# Patient Record
Sex: Female | Born: 1985 | Race: Black or African American | Hispanic: No | Marital: Married | Smoking: Former smoker
Health system: Southern US, Community
[De-identification: ages and names within clinical notes are randomized; demographics above are authoritative.]

## PROBLEM LIST (undated history)

## (undated) ENCOUNTER — Inpatient Hospital Stay (HOSPITAL_COMMUNITY): Payer: Self-pay

## (undated) DIAGNOSIS — D259 Leiomyoma of uterus, unspecified: Secondary | ICD-10-CM

## (undated) DIAGNOSIS — F319 Bipolar disorder, unspecified: Secondary | ICD-10-CM

## (undated) DIAGNOSIS — K219 Gastro-esophageal reflux disease without esophagitis: Secondary | ICD-10-CM

## (undated) DIAGNOSIS — Z9151 Personal history of suicidal behavior: Secondary | ICD-10-CM

## (undated) DIAGNOSIS — G4733 Obstructive sleep apnea (adult) (pediatric): Secondary | ICD-10-CM

## (undated) DIAGNOSIS — J4520 Mild intermittent asthma, uncomplicated: Secondary | ICD-10-CM

## (undated) DIAGNOSIS — F419 Anxiety disorder, unspecified: Secondary | ICD-10-CM

## (undated) DIAGNOSIS — Z8489 Family history of other specified conditions: Secondary | ICD-10-CM

## (undated) DIAGNOSIS — Z8679 Personal history of other diseases of the circulatory system: Secondary | ICD-10-CM

## (undated) DIAGNOSIS — J189 Pneumonia, unspecified organism: Secondary | ICD-10-CM

## (undated) DIAGNOSIS — R519 Headache, unspecified: Secondary | ICD-10-CM

## (undated) DIAGNOSIS — G5601 Carpal tunnel syndrome, right upper limb: Secondary | ICD-10-CM

## (undated) DIAGNOSIS — Z9989 Dependence on other enabling machines and devices: Secondary | ICD-10-CM

## (undated) DIAGNOSIS — Z8619 Personal history of other infectious and parasitic diseases: Secondary | ICD-10-CM

## (undated) DIAGNOSIS — Z915 Personal history of self-harm: Secondary | ICD-10-CM

## (undated) DIAGNOSIS — R7303 Prediabetes: Secondary | ICD-10-CM

## (undated) DIAGNOSIS — M1990 Unspecified osteoarthritis, unspecified site: Secondary | ICD-10-CM

## (undated) DIAGNOSIS — R51 Headache: Secondary | ICD-10-CM

## (undated) DIAGNOSIS — Z8614 Personal history of Methicillin resistant Staphylococcus aureus infection: Secondary | ICD-10-CM

## (undated) DIAGNOSIS — F329 Major depressive disorder, single episode, unspecified: Secondary | ICD-10-CM

## (undated) DIAGNOSIS — F32A Depression, unspecified: Secondary | ICD-10-CM

## (undated) DIAGNOSIS — I10 Essential (primary) hypertension: Secondary | ICD-10-CM

## (undated) HISTORY — DX: Major depressive disorder, single episode, unspecified: F32.9

## (undated) HISTORY — PX: CHOLECYSTECTOMY: SHX55

## (undated) HISTORY — PX: TRANSTHORACIC ECHOCARDIOGRAM: SHX275

## (undated) HISTORY — PX: ESOPHAGOGASTRODUODENOSCOPY: SHX1529

## (undated) HISTORY — DX: Depression, unspecified: F32.A

## (undated) HISTORY — PX: DILATION AND EVACUATION: SHX1459

## (undated) HISTORY — PX: SKIN GRAFT: SHX250

## (undated) HISTORY — PX: WISDOM TOOTH EXTRACTION: SHX21

## (undated) HISTORY — PX: COLONOSCOPY: SHX174

---
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
## 2007-01-18 ENCOUNTER — Emergency Department (HOSPITAL_COMMUNITY): Admission: EM | Admit: 2007-01-18 | Discharge: 2007-01-18 | Payer: Self-pay | Admitting: Emergency Medicine

## 2007-01-30 ENCOUNTER — Encounter (INDEPENDENT_AMBULATORY_CARE_PROVIDER_SITE_OTHER): Payer: Self-pay | Admitting: Family Medicine

## 2007-01-30 ENCOUNTER — Ambulatory Visit: Payer: Self-pay | Admitting: Sports Medicine

## 2007-01-30 DIAGNOSIS — A6000 Herpesviral infection of urogenital system, unspecified: Secondary | ICD-10-CM | POA: Insufficient documentation

## 2007-01-30 DIAGNOSIS — B351 Tinea unguium: Secondary | ICD-10-CM | POA: Insufficient documentation

## 2007-01-30 DIAGNOSIS — R300 Dysuria: Secondary | ICD-10-CM | POA: Insufficient documentation

## 2007-01-30 LAB — CONVERTED CEMR LAB
Bilirubin Urine: NEGATIVE
Glucose, Urine, Semiquant: NEGATIVE
Ketones, urine, test strip: NEGATIVE
Nitrite: NEGATIVE
Protein, U semiquant: 100
Specific Gravity, Urine: >=1.03
Urobilinogen, UA: 0.2
pH: 6

## 2007-02-04 ENCOUNTER — Telehealth (INDEPENDENT_AMBULATORY_CARE_PROVIDER_SITE_OTHER): Payer: Self-pay

## 2007-02-05 ENCOUNTER — Ambulatory Visit: Payer: Self-pay | Admitting: Family Medicine

## 2007-02-05 ENCOUNTER — Encounter (INDEPENDENT_AMBULATORY_CARE_PROVIDER_SITE_OTHER): Payer: Self-pay | Admitting: Family Medicine

## 2007-02-05 DIAGNOSIS — N898 Other specified noninflammatory disorders of vagina: Secondary | ICD-10-CM | POA: Insufficient documentation

## 2007-02-05 LAB — CONVERTED CEMR LAB
Beta hcg, urine, semiquantitative: NEGATIVE
Whiff Test: NEGATIVE

## 2007-02-09 LAB — CONVERTED CEMR LAB
Chlamydia, DNA Probe: NEGATIVE
GC Probe Amp, Genital: NEGATIVE

## 2007-02-11 ENCOUNTER — Encounter: Payer: Self-pay | Admitting: Family Medicine

## 2007-02-19 ENCOUNTER — Encounter: Payer: Self-pay | Admitting: *Deleted

## 2007-02-24 ENCOUNTER — Telehealth (INDEPENDENT_AMBULATORY_CARE_PROVIDER_SITE_OTHER): Payer: Self-pay | Admitting: Family Medicine

## 2007-02-24 ENCOUNTER — Telehealth: Payer: Self-pay | Admitting: *Deleted

## 2007-02-25 ENCOUNTER — Telehealth: Payer: Self-pay | Admitting: *Deleted

## 2007-02-26 ENCOUNTER — Ambulatory Visit: Payer: Self-pay | Admitting: Sports Medicine

## 2007-02-26 DIAGNOSIS — R0602 Shortness of breath: Secondary | ICD-10-CM | POA: Insufficient documentation

## 2007-02-26 DIAGNOSIS — M549 Dorsalgia, unspecified: Secondary | ICD-10-CM | POA: Insufficient documentation

## 2007-02-26 LAB — CONVERTED CEMR LAB
Bilirubin Urine: NEGATIVE
Blood in Urine, dipstick: NEGATIVE
Glucose, Urine, Semiquant: NEGATIVE
Ketones, urine, test strip: NEGATIVE
Nitrite: NEGATIVE
Protein, U semiquant: 100
Specific Gravity, Urine: >=1.03
Urobilinogen, UA: 1
WBC Urine, dipstick: NEGATIVE
pH: 6.5

## 2007-02-27 ENCOUNTER — Encounter (INDEPENDENT_AMBULATORY_CARE_PROVIDER_SITE_OTHER): Payer: Self-pay | Admitting: Family Medicine

## 2007-03-01 ENCOUNTER — Telehealth (INDEPENDENT_AMBULATORY_CARE_PROVIDER_SITE_OTHER): Payer: Self-pay | Admitting: Family Medicine

## 2007-03-04 ENCOUNTER — Telehealth: Payer: Self-pay | Admitting: *Deleted

## 2007-03-04 ENCOUNTER — Telehealth (INDEPENDENT_AMBULATORY_CARE_PROVIDER_SITE_OTHER): Payer: Self-pay | Admitting: *Deleted

## 2007-03-06 ENCOUNTER — Telehealth (INDEPENDENT_AMBULATORY_CARE_PROVIDER_SITE_OTHER): Payer: Self-pay | Admitting: Family Medicine

## 2007-03-20 ENCOUNTER — Ambulatory Visit: Payer: Self-pay | Admitting: Family Medicine

## 2007-03-23 ENCOUNTER — Emergency Department (HOSPITAL_COMMUNITY): Admission: EM | Admit: 2007-03-23 | Discharge: 2007-03-23 | Payer: Self-pay | Admitting: Family Medicine

## 2007-04-15 ENCOUNTER — Emergency Department (HOSPITAL_COMMUNITY): Admission: EM | Admit: 2007-04-15 | Discharge: 2007-04-15 | Payer: Self-pay | Admitting: Emergency Medicine

## 2007-05-20 ENCOUNTER — Emergency Department (HOSPITAL_COMMUNITY): Admission: EM | Admit: 2007-05-20 | Discharge: 2007-05-21 | Payer: Self-pay | Admitting: Emergency Medicine

## 2007-06-14 ENCOUNTER — Emergency Department (HOSPITAL_COMMUNITY): Admission: EM | Admit: 2007-06-14 | Discharge: 2007-06-14 | Payer: Self-pay | Admitting: Emergency Medicine

## 2007-08-17 ENCOUNTER — Emergency Department (HOSPITAL_COMMUNITY): Admission: EM | Admit: 2007-08-17 | Discharge: 2007-08-17 | Payer: Self-pay | Admitting: Emergency Medicine

## 2007-12-10 ENCOUNTER — Emergency Department (HOSPITAL_COMMUNITY): Admission: EM | Admit: 2007-12-10 | Discharge: 2007-12-10 | Payer: Self-pay | Admitting: Emergency Medicine

## 2008-03-19 ENCOUNTER — Emergency Department (HOSPITAL_COMMUNITY): Admission: EM | Admit: 2008-03-19 | Discharge: 2008-03-19 | Payer: Self-pay | Admitting: Emergency Medicine

## 2008-03-31 ENCOUNTER — Emergency Department (HOSPITAL_COMMUNITY): Admission: EM | Admit: 2008-03-31 | Discharge: 2008-03-31 | Payer: Self-pay | Admitting: Emergency Medicine

## 2008-04-03 ENCOUNTER — Emergency Department (HOSPITAL_COMMUNITY): Admission: EM | Admit: 2008-04-03 | Discharge: 2008-04-03 | Payer: Self-pay | Admitting: Emergency Medicine

## 2008-04-07 ENCOUNTER — Inpatient Hospital Stay (HOSPITAL_COMMUNITY): Admission: AD | Admit: 2008-04-07 | Discharge: 2008-04-07 | Payer: Self-pay | Admitting: Obstetrics and Gynecology

## 2008-07-11 ENCOUNTER — Inpatient Hospital Stay (HOSPITAL_COMMUNITY): Admission: AD | Admit: 2008-07-11 | Discharge: 2008-07-11 | Payer: Self-pay | Admitting: Obstetrics and Gynecology

## 2008-07-25 ENCOUNTER — Inpatient Hospital Stay (HOSPITAL_COMMUNITY): Admission: AD | Admit: 2008-07-25 | Discharge: 2008-07-25 | Payer: Self-pay | Admitting: Obstetrics and Gynecology

## 2008-11-17 ENCOUNTER — Inpatient Hospital Stay (HOSPITAL_COMMUNITY): Admission: AD | Admit: 2008-11-17 | Discharge: 2008-11-17 | Payer: Self-pay | Admitting: Obstetrics and Gynecology

## 2008-11-21 ENCOUNTER — Inpatient Hospital Stay (HOSPITAL_COMMUNITY): Admission: AD | Admit: 2008-11-21 | Discharge: 2008-11-21 | Payer: Self-pay | Admitting: Obstetrics and Gynecology

## 2008-12-08 ENCOUNTER — Inpatient Hospital Stay (HOSPITAL_COMMUNITY): Admission: AD | Admit: 2008-12-08 | Discharge: 2008-12-08 | Payer: Self-pay | Admitting: Obstetrics and Gynecology

## 2008-12-10 ENCOUNTER — Inpatient Hospital Stay (HOSPITAL_COMMUNITY): Admission: AD | Admit: 2008-12-10 | Discharge: 2008-12-12 | Payer: Self-pay | Admitting: Obstetrics and Gynecology

## 2008-12-20 ENCOUNTER — Emergency Department (HOSPITAL_COMMUNITY): Admission: EM | Admit: 2008-12-20 | Discharge: 2008-12-20 | Payer: Self-pay | Admitting: Emergency Medicine

## 2009-01-09 ENCOUNTER — Emergency Department (HOSPITAL_COMMUNITY): Admission: EM | Admit: 2009-01-09 | Discharge: 2009-01-10 | Payer: Self-pay | Admitting: Emergency Medicine

## 2009-01-27 DIAGNOSIS — F1210 Cannabis abuse, uncomplicated: Secondary | ICD-10-CM | POA: Insufficient documentation

## 2009-01-27 DIAGNOSIS — F913 Oppositional defiant disorder: Secondary | ICD-10-CM | POA: Insufficient documentation

## 2009-01-27 DIAGNOSIS — F1010 Alcohol abuse, uncomplicated: Secondary | ICD-10-CM | POA: Insufficient documentation

## 2009-01-27 DIAGNOSIS — F909 Attention-deficit hyperactivity disorder, unspecified type: Secondary | ICD-10-CM | POA: Insufficient documentation

## 2009-01-27 DIAGNOSIS — I10 Essential (primary) hypertension: Secondary | ICD-10-CM | POA: Insufficient documentation

## 2009-01-27 DIAGNOSIS — K625 Hemorrhage of anus and rectum: Secondary | ICD-10-CM | POA: Insufficient documentation

## 2009-01-27 DIAGNOSIS — F3130 Bipolar disorder, current episode depressed, mild or moderate severity, unspecified: Secondary | ICD-10-CM | POA: Insufficient documentation

## 2009-02-08 ENCOUNTER — Telehealth: Payer: Self-pay | Admitting: Gastroenterology

## 2009-06-20 ENCOUNTER — Emergency Department (HOSPITAL_COMMUNITY): Admission: EM | Admit: 2009-06-20 | Discharge: 2009-06-20 | Payer: Self-pay | Admitting: Family Medicine

## 2009-10-08 ENCOUNTER — Emergency Department (HOSPITAL_COMMUNITY)
Admission: EM | Admit: 2009-10-08 | Discharge: 2009-10-08 | Payer: Self-pay | Source: Home / Self Care | Admitting: Emergency Medicine

## 2009-11-03 ENCOUNTER — Emergency Department (HOSPITAL_COMMUNITY): Admission: EM | Admit: 2009-11-03 | Discharge: 2009-11-03 | Payer: Self-pay | Admitting: Emergency Medicine

## 2010-03-31 ENCOUNTER — Emergency Department (HOSPITAL_COMMUNITY): Admission: EM | Admit: 2010-03-31 | Discharge: 2010-03-31 | Payer: Self-pay | Admitting: Family Medicine

## 2010-04-02 ENCOUNTER — Emergency Department (HOSPITAL_COMMUNITY): Admission: EM | Admit: 2010-04-02 | Discharge: 2010-04-02 | Payer: Self-pay | Admitting: Emergency Medicine

## 2010-05-19 ENCOUNTER — Emergency Department (HOSPITAL_COMMUNITY): Admission: EM | Admit: 2010-05-19 | Discharge: 2010-05-19 | Payer: Self-pay | Admitting: Emergency Medicine

## 2010-05-19 ENCOUNTER — Emergency Department (HOSPITAL_COMMUNITY): Admission: EM | Admit: 2010-05-19 | Discharge: 2010-05-20 | Payer: Self-pay | Admitting: Emergency Medicine

## 2010-06-08 DEATH — deceased

## 2010-11-07 NOTE — Progress Notes (Signed)
Summary: Jarold Motto will not see   ---- 01/31/2009 8:44 AM, Mardella Layman MD [facility] wrote: I will not accept her as a patient,,,,please remove her  ---- 01/28/2009 10:46 AM, Harlow Mares CMA wrote: Lorain Childes.Marland KitchenMarland KitchenMarland KitchenPlease see the note from 02-27-2007 and 03-20-2007, patient is on your schedule for 02-01-2009. Just thought you would need to review her notes before her office visit. Please let me know if you would like me to contact the patient.  Hulan Saas ------------------------------

## 2010-11-07 NOTE — Progress Notes (Signed)
  Phone Note Call from Patient   Summary of Call: States saw Dr. Seleta Rhymes recently and he was supposed to call in script for Valtrex but its not at her pharmacy.  Requesting me to call it in.  Confirmed in centricity that Dr. Seleta Rhymes did try to prescribe medication but per drfrist, fax failed.  Called in script directly to [facility] drug exactly as precscribed by dr. Seleta Rhymes. Initial call taken by: Rolm Gala MD,  Mar 01, 2007 5:09 PM

## 2010-11-07 NOTE — Progress Notes (Signed)
  Phone Note Call from Patient   Summary of Call: Herpes outbreaks itches and she is mad.  They hurt.  I let my mommy look at them.  Has not tried Tylenol or Motrin.  Stop scratchin them, buy some Motrin 400mg  every 4 hours or Tylenol 325 - 2 pills every 4-6 hours.  Cold ice pack for itching. Initial call taken by: Rolm Gala MD,  Mar 06, 2007 5:36 PM

## 2010-11-07 NOTE — Assessment & Plan Note (Signed)
Summary: blister groin area, congestion/ls   Vital Signs:  Patient Profile:   25 Years Old Female Height:     64.5 inches Weight:      164 pounds Temp:     100 degrees F Pulse rate:   57 / minute BP sitting:   122 / 83  Pt. in pain?   yes    Location:   groin    Intensity:   8  Vitals Entered By: Dennison Nancy RN (February 05, 2007 3:34 PM)                Chief Complaint:  Two blisters to right groin area.  History of Present Illness: 25 yo   Blister- right groin area.  Draining pus.  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  Taking vicodin and nyquil secondary to pain.   Seen at urgent care few days prior to evaluation here for dysuria and blisters in vaginal area given Valtrex states no cultures were obtained .  LMP 01/07/07 normal duration.  Last sexual encounter 1 week ago protected per pt.  Treated for chlamydia in past 2006, states partner was treated.  Female partner has hx herpes.          Physical Exam  General:     Anxious, inappropriate behavior and responses.  Wanting to smoke in exam room Abdomen:     Bowel sounds positive,abdomen soft and non-tender without masses, organomegaly or hernias noted. Genitalia:     Normal ext female genitalia.  No discharge < 1 cm nodule right inguinal area no drainage.  Small ulceration right vaginal wall.  No cervical motion tenderness.     Impression & Recommendations:  Problem # 1:  VAGINAL DISCHARGE (ICD-623.5) Assessment: New High risk sexual behavior and recently tx herpes genitalia.  Cultures pending for today.  Inguinal nodule concern  possible GC/ CHL, bartholins, v/s lymphogranuloma venereum.   HIV/RPR pending.   Follow up cultures.  Apply warm compress allow spontaneous drainage.  Recommend contraception use.  Needs extensive education and follow up with primary physician.  Qualifies for gardisil.   Orders: [facility]- Est  Level 4 (99214) GC/Chlamydia-[facility] (87591/87491) HSV 2- [facility]  (16109-60454) HIV-[facility] [phone]) Wet Prep- [facility] (29562) RPR-[facility] [phone]) U Preg-[facility] (96295) [facility]    Patient Instructions: 1)  Please schedule a follow-up appointment in 2 weeks with Dr. Philipp Deputy primary physician. 2)  1) Lab work drawn today, if any abnormal results we will contact you. 3)  2) Apply warm compress to the area and allow to drain.   4)  Return sooner if fever or worsening symptoms.    Laboratory Results   Urine Tests  Date/Time Recieved: February 05, 2007 4:29 PM  Date/Time Reported: February 05, 2007 4:39 PM     Urine HCG: negative Comments: ...................................................................DONNA [facility]  February 05, 2007 4:39 PM  Date/Time Received: February 05, 2007 4:29 PM  Date/Time Reported: February 05, 2007 4:40 PM   Wet Mount/KOH Source: Vaginal WBC/hpf 0-3 Bacteria/hpf 3+  Rods Clue cells/hpf none  Negative whiff Yeast/hpf none Trichomonas/hpf none Comments ...................................................................DONNA [facility]  February 05, 2007 4:40 PM

## 2010-11-07 NOTE — Progress Notes (Signed)
Summary: Triage  Phone Note Call from Patient Call back at [phone]   Summary of Call: pt is wanting to know what something stand for - HSB? ( I couldn't understand what she was saying) Initial call taken by: Haydee Salter,  Mar 04, 2007 2:12 PM  Follow-up for Phone Call        Spoke with pt - she states she got test results from our office, and wants to know what HSV II is because her test for this was positive.  Advised pt that this is genital herpes.  She states she takes valtrex, advised that this is the medication commonly used to help control genital herpes.  Follow-up by: AMY MARTIN RN,  Mar 04, 2007 2:22 PM

## 2010-11-07 NOTE — Assessment & Plan Note (Signed)
Summary: [name]
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
## 2010-11-07 NOTE — Progress Notes (Signed)
Summary: wi request  Phone Note Call from Patient Call back at [phone]   Reason for Call: Talk to Nurse Summary of Call: pt is requesting wi appt, she sts she could not make her appt today but still needs to be seen asap Initial call taken by: ERIN LEVAN,  Feb 25, 2007 3:01 PM  Follow-up for Phone Call        reports lower pelvic pain and lt low back pain. also has herpes breakout . areas  bleeding. states she has med to take for herpes . this just started today. was not able to come to apoointment today but requests appointment asap. appointment scheduled tomorrow afternoon. Follow-up by: Theresia Lo RN,  Feb 25, 2007 3:36 PM

## 2010-11-07 NOTE — Progress Notes (Signed)
Summary: WI request  Phone Note Call from Patient Call back at Home Phone [phone]   Reason for Call: Talk to Nurse Summary of Call: pt staes she was in 3 days ago and is doing no better - would like to be seen today Initial call taken by: Haydee Salter,  February 04, 2007 9:07 AM    pt reports blister quarter size groin area when developed 2 days ago.also having problems with congestion and watery eyes. appointment scheduled tomorrow. Theresia Lo RN 10:55
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
## 2010-11-07 NOTE — Progress Notes (Signed)
Summary: WI request  Phone Note Call from Patient Call back at [phone]   Reason for Call: Talk to Nurse Summary of Call: pt had 9 am appt but missed it and states she needs to see a dr today for her fingers Initial call taken by: Haydee Salter,  December 16, 2006 10:05 AM  Follow-up for Phone Call        ATTEMPTED TO CONTACT PT ,NO ANSWER Follow-up by: Theresia Lo RN,  December 16, 2006 11:08 AM  Additional Follow-up for Phone Call Additional follow up Details #1::        ATTEMPTED TO CONTACT PT AGAIN NO ANSWER Additional Follow-up by: Theresia Lo RN,  December 16, 2006 12:14 PM

## 2010-11-07 NOTE — Miscellaneous (Signed)
Summary: fired from practice  Clinical Lists Changes  Observations: Added new observation of HPI: Due to the forging of prescriptions in my name (Tramadol and Ultram - see phone note from [facility]) and also in light of continued disruptive behavior along with multiple DKNA and lack of compliance, Savannah Batty will be fired from this practice.  She did not show for her appointment with me so that I could tell her in person and thus will need the registered letter.  This has been discussed both with Dennison Nancy and Dr Deirdre Priest and they are also in agreement. She has medicaid and will be able to find healthcare elsewhere easily.  (02/27/2007 16:41)      History of Present Illness: Due to the forging of prescriptions in my name (Tramadol and Ultram - see phone note from [facility]) and also in light of continued disruptive behavior along with multiple DKNA and lack of compliance, Sevanna Salado will be fired from this practice.  She did not show for her appointment with me so that I could tell her in person and thus will need the registered letter.  This has been discussed both with Dennison Nancy and Dr Deirdre Priest and they are also in agreement. She has medicaid and will be able to find healthcare elsewhere easily.
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
## 2010-11-07 NOTE — Progress Notes (Signed)
Summary: HIV results  Phone Note Call from Patient Call back at [phone]   Summary of Call: pt is requesting results from HIV test Initial call taken by: Haydee Salter,  Mar 04, 2007 9:41 AM  Follow-up for Phone Call        TC to pt regarding above, mess. left asking pt to return call. Pt needs a f/u appt for lab results Follow-up by: Tomasa Rand,  Mar 04, 2007 11:42 AM  Additional Follow-up for Phone Call Additional follow up Details #1::        Pt has been fired from our practice.  Please call patient with results but be aware she is supposed to be receiving a letter to fire her. No follow up appointments should be scheduled.  Additional Follow-up by: Lupita Raider MD,  Mar 04, 2007 7:54 PM   Additional Follow-up for Phone Call Additional follow up Details #2::    see triage note from 5.27.08 Follow-up by: [facility] CMA,  Mar 05, 2007 9:41 AM

## 2010-11-07 NOTE — Progress Notes (Signed)
  Phone Note Call from Patient   Reason for Call: Talk to Doctor Summary of Call: mother called.  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  mom wants to know what HSV2 means.  I told her it means genital herpes. Initial call taken by: Rolm Gala MD,  Mar 06, 2007 5:56 PM

## 2010-11-07 NOTE — Progress Notes (Signed)
  Phone Note Call from Patient Call back at [phone]   Summary of Call: Has genital herpes got pills for 5 days, hurting still, not gone.  Bad cramps in stomuch, side, back.  No urianry fruqency, some burning with pee.  Plan: Needs [place] appt to r/o pyelonephritis.   Initial call taken by: Rolm Gala MD,  Feb 24, 2007 12:58 PM  Follow-up for Phone Call        pt unable to come in today. refused any time slots. made appt for tues pm at her request Follow-up by: Golden Circle RN,  Feb 24, 2007 1:44 PM
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
## 2010-11-07 NOTE — Progress Notes (Signed)
Summary: triage  Phone Note Call from Patient Call back at [phone]   Reason for Call: Talk to Nurse Summary of Call: pt sts she has an appt tomorrow but doesn't think she can wait that long, she sts her stomach hurts really bad & shes out of breath, she wants to know if she should go to the er? Initial call taken by: ERIN LEVAN,  Feb 24, 2007 4:24 PM  Follow-up for Phone Call        she is having a relative pick her up from work. states she is much worse. Will go to Urgent Care Follow-up by: Golden Circle RN,  Feb 24, 2007 4:32 PM

## 2010-12-22 LAB — POCT I-STAT, CHEM 8
BUN: 16 mg/dL (ref 6–23)
Calcium, Ion: 1.18 mmol/L (ref 1.12–1.32)
Chloride: 108 mEq/L (ref 96–112)
Creatinine, Ser: 0.9 mg/dL (ref 0.4–1.2)
Glucose, Bld: 98 mg/dL (ref 70–99)
HCT: 41 % (ref 36.0–46.0)
Hemoglobin: 13.9 g/dL (ref 12.0–15.0)
Potassium: 3.9 mEq/L (ref 3.5–5.1)
Sodium: 141 mEq/L (ref 135–145)
TCO2: 24 mmol/L (ref 0–100)

## 2010-12-22 LAB — DIFFERENTIAL
Basophils Absolute: 0 10*3/uL (ref 0.0–0.1)
Basophils Relative: 0 % (ref 0–1)
Eosinophils Absolute: 0.4 10*3/uL (ref 0.0–0.7)
Eosinophils Relative: 4 % (ref 0–5)
Lymphocytes Relative: 17 % (ref 12–46)
Lymphs Abs: 1.7 10*3/uL (ref 0.7–4.0)
Monocytes Absolute: 0.2 10*3/uL (ref 0.1–1.0)
Monocytes Relative: 2 % — ABNORMAL LOW (ref 3–12)
Neutro Abs: 8.1 10*3/uL — ABNORMAL HIGH (ref 1.7–7.7)
Neutrophils Relative %: 78 % — ABNORMAL HIGH (ref 43–77)

## 2010-12-22 LAB — CBC
HCT: 38.3 % (ref 36.0–46.0)
Hemoglobin: 12.8 g/dL (ref 12.0–15.0)
MCH: 29.4 pg (ref 26.0–34.0)
MCHC: 33.4 g/dL (ref 30.0–36.0)
MCV: 87.8 fL (ref 78.0–100.0)
Platelets: 273 10*3/uL (ref 150–400)
RBC: 4.36 MIL/uL (ref 3.87–5.11)
RDW: 15.5 % (ref 11.5–15.5)
WBC: 10.3 10*3/uL (ref 4.0–10.5)

## 2010-12-24 LAB — POCT URINALYSIS DIP (DEVICE)
Bilirubin Urine: NEGATIVE
Glucose, UA: NEGATIVE mg/dL
Hgb urine dipstick: NEGATIVE
Ketones, ur: NEGATIVE mg/dL
Nitrite: NEGATIVE
Protein, ur: NEGATIVE mg/dL
Specific Gravity, Urine: 1.025 (ref 1.005–1.030)
Urobilinogen, UA: 0.2 mg/dL (ref 0.0–1.0)
pH: 6 (ref 5.0–8.0)

## 2010-12-24 LAB — GC/CHLAMYDIA PROBE AMP, GENITAL
Chlamydia, DNA Probe: NEGATIVE
GC Probe Amp, Genital: NEGATIVE

## 2010-12-24 LAB — WET PREP, GENITAL
Trich, Wet Prep: NONE SEEN
Yeast Wet Prep HPF POC: NONE SEEN

## 2010-12-24 LAB — POCT PREGNANCY, URINE: Preg Test, Ur: NEGATIVE

## 2010-12-28 ENCOUNTER — Inpatient Hospital Stay (INDEPENDENT_AMBULATORY_CARE_PROVIDER_SITE_OTHER)
Admission: RE | Admit: 2010-12-28 | Discharge: 2010-12-28 | Disposition: A | Discharge status: Home / Self Care | Payer: Medicare Other | Source: Ambulatory Visit | Attending: Emergency Medicine | Admitting: Emergency Medicine

## 2010-12-28 DIAGNOSIS — J019 Acute sinusitis, unspecified: Secondary | ICD-10-CM

## 2010-12-28 DIAGNOSIS — R05 Cough: Secondary | ICD-10-CM

## 2010-12-28 DIAGNOSIS — R059 Cough, unspecified: Secondary | ICD-10-CM

## 2011-01-12 LAB — URINE CULTURE: Colony Count: >=100000

## 2011-01-12 LAB — POCT URINALYSIS DIP (DEVICE)
Bilirubin Urine: NEGATIVE
Glucose, UA: NEGATIVE mg/dL
Hgb urine dipstick: NEGATIVE
Ketones, ur: NEGATIVE mg/dL
Nitrite: NEGATIVE
Protein, ur: 30 mg/dL — AB
Specific Gravity, Urine: 1.025 (ref 1.005–1.030)
Urobilinogen, UA: 0.2 mg/dL (ref 0.0–1.0)
pH: 6 (ref 5.0–8.0)

## 2011-01-12 LAB — WET PREP, GENITAL
Trich, Wet Prep: NONE SEEN
Yeast Wet Prep HPF POC: NONE SEEN

## 2011-01-12 LAB — GC/CHLAMYDIA PROBE AMP, GENITAL
Chlamydia, DNA Probe: NEGATIVE
GC Probe Amp, Genital: NEGATIVE

## 2011-01-12 LAB — POCT PREGNANCY, URINE: Preg Test, Ur: NEGATIVE

## 2011-01-17 LAB — CBC
HCT: 33.7 % — ABNORMAL LOW (ref 36.0–46.0)
Hemoglobin: 11.3 g/dL — ABNORMAL LOW (ref 12.0–15.0)
MCHC: 33.4 g/dL (ref 30.0–36.0)
MCV: 86.7 fL (ref 78.0–100.0)
Platelets: 215 10*3/uL (ref 150–400)
RBC: 3.89 MIL/uL (ref 3.87–5.11)
RDW: 16.3 % — ABNORMAL HIGH (ref 11.5–15.5)
WBC: 6.4 10*3/uL (ref 4.0–10.5)

## 2011-01-17 LAB — DIFFERENTIAL
Basophils Absolute: 0 10*3/uL (ref 0.0–0.1)
Basophils Relative: 1 % (ref 0–1)
Eosinophils Absolute: 0.7 10*3/uL (ref 0.0–0.7)
Eosinophils Relative: 11 % — ABNORMAL HIGH (ref 0–5)
Lymphocytes Relative: 50 % — ABNORMAL HIGH (ref 12–46)
Lymphs Abs: 3.2 10*3/uL (ref 0.7–4.0)
Monocytes Absolute: 0.3 10*3/uL (ref 0.1–1.0)
Monocytes Relative: 5 % (ref 3–12)
Neutro Abs: 2.2 10*3/uL (ref 1.7–7.7)
Neutrophils Relative %: 34 % — ABNORMAL LOW (ref 43–77)

## 2011-01-17 LAB — BASIC METABOLIC PANEL
BUN: 9 mg/dL (ref 6–23)
CO2: 25 mEq/L (ref 19–32)
Calcium: 8.8 mg/dL (ref 8.4–10.5)
Chloride: 111 mEq/L (ref 96–112)
Creatinine, Ser: 0.82 mg/dL (ref 0.4–1.2)
GFR calc Af Amer: >60 mL/min (ref >60)
GFR calc non Af Amer: >60 mL/min (ref >60)
Glucose, Bld: 112 mg/dL — ABNORMAL HIGH (ref 70–99)
Potassium: 3.2 mEq/L — ABNORMAL LOW (ref 3.5–5.1)
Sodium: 141 mEq/L (ref 135–145)

## 2011-01-17 LAB — HEMOCCULT GUIAC POC 1CARD (OFFICE): Fecal Occult Bld: POSITIVE

## 2011-01-18 LAB — HEPATIC FUNCTION PANEL
ALT: 22 U/L (ref 0–35)
AST: 20 U/L (ref 0–37)
Albumin: 3.1 g/dL — ABNORMAL LOW (ref 3.5–5.2)
Alkaline Phosphatase: 123 U/L — ABNORMAL HIGH (ref 39–117)
Bilirubin, Direct: <0.1 mg/dL (ref 0.0–0.3)
Total Bilirubin: 0.6 mg/dL (ref 0.3–1.2)
Total Protein: 6.1 g/dL (ref 6.0–8.3)

## 2011-01-18 LAB — URINALYSIS, ROUTINE W REFLEX MICROSCOPIC
Bilirubin Urine: NEGATIVE
Glucose, UA: NEGATIVE mg/dL
Ketones, ur: NEGATIVE mg/dL
Nitrite: NEGATIVE
Protein, ur: NEGATIVE mg/dL
Specific Gravity, Urine: 1.015 (ref 1.005–1.030)
Urobilinogen, UA: 1 mg/dL (ref 0.0–1.0)
pH: 7 (ref 5.0–8.0)

## 2011-01-18 LAB — CBC
HCT: 33.5 % — ABNORMAL LOW (ref 36.0–46.0)
HCT: 33.3 % — ABNORMAL LOW (ref 36.0–46.0)
HCT: 33.7 % — ABNORMAL LOW (ref 36.0–46.0)
Hemoglobin: 11.2 g/dL — ABNORMAL LOW (ref 12.0–15.0)
Hemoglobin: 11 g/dL — ABNORMAL LOW (ref 12.0–15.0)
Hemoglobin: 11.2 g/dL — ABNORMAL LOW (ref 12.0–15.0)
MCHC: 33.3 g/dL (ref 30.0–36.0)
MCHC: 33.2 g/dL (ref 30.0–36.0)
MCHC: 33.3 g/dL (ref 30.0–36.0)
MCV: 87.6 fL (ref 78.0–100.0)
MCV: 88 fL (ref 78.0–100.0)
MCV: 88.4 fL (ref 78.0–100.0)
Platelets: 268 10*3/uL (ref 150–400)
Platelets: 184 10*3/uL (ref 150–400)
Platelets: 190 10*3/uL (ref 150–400)
RBC: 3.83 MIL/uL — ABNORMAL LOW (ref 3.87–5.11)
RBC: 3.76 MIL/uL — ABNORMAL LOW (ref 3.87–5.11)
RBC: 3.83 MIL/uL — ABNORMAL LOW (ref 3.87–5.11)
RDW: 15.2 % (ref 11.5–15.5)
RDW: 14.9 % (ref 11.5–15.5)
RDW: 15.3 % (ref 11.5–15.5)
WBC: 7.2 10*3/uL (ref 4.0–10.5)
WBC: 12.8 10*3/uL — ABNORMAL HIGH (ref 4.0–10.5)
WBC: 7.9 10*3/uL (ref 4.0–10.5)

## 2011-01-18 LAB — DIFFERENTIAL
Basophils Absolute: 0 10*3/uL (ref 0.0–0.1)
Basophils Relative: 0 % (ref 0–1)
Eosinophils Absolute: 0.3 10*3/uL (ref 0.0–0.7)
Eosinophils Relative: 4 % (ref 0–5)
Lymphocytes Relative: 26 % (ref 12–46)
Lymphs Abs: 1.9 10*3/uL (ref 0.7–4.0)
Monocytes Absolute: 0.4 10*3/uL (ref 0.1–1.0)
Monocytes Relative: 5 % (ref 3–12)
Neutro Abs: 4.6 10*3/uL (ref 1.7–7.7)
Neutrophils Relative %: 65 % (ref 43–77)

## 2011-01-18 LAB — URINE MICROSCOPIC-ADD ON

## 2011-01-18 LAB — POCT I-STAT, CHEM 8
BUN: 12 mg/dL (ref 6–23)
Calcium, Ion: 1.19 mmol/L (ref 1.12–1.32)
Chloride: 108 mEq/L (ref 96–112)
Creatinine, Ser: 0.9 mg/dL (ref 0.4–1.2)
Glucose, Bld: 96 mg/dL (ref 70–99)
HCT: 35 % — ABNORMAL LOW (ref 36.0–46.0)
Hemoglobin: 11.9 g/dL — ABNORMAL LOW (ref 12.0–15.0)
Potassium: 4 mEq/L (ref 3.5–5.1)
Sodium: 142 mEq/L (ref 135–145)
TCO2: 26 mmol/L (ref 0–100)

## 2011-01-18 LAB — RPR: RPR Ser Ql: NONREACTIVE
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
## 2011-02-23 NOTE — Discharge Summary (Signed)
[facility]  Patient:    [name], [name]                     MRN: [id number] Adm. Date:  [id number] Disc. Date: [id number] Attending:  Ephriam Knuckles H                           Discharge Summary  REASON FOR ADMISSION:  This 25 year old black female was admitted status post drug overdose as a suicide attempt.  For further History of Present Illness, please see the patients Psychiatric Admission Assessment by Dr. Carolanne Grumbling.  PHYSICAL EXAMINATION:  Physical Examination at the time of admission was remarkable for an old healed scar of the left forearm.  Right arm had a self-inflicted well-healed laceration.  LABORATORY DATA:  The patient underwent a laboratory workup to rule out any medical problems contributing to her symptomatology.  Urine pregnancy test was negative.  CBC was significant for an MCHC of 34.2, and was otherwise unremarkable.  Thyroid function tests were within normal limits.  A GTT was within normal limits.  A GGT was within normal limits.  UA was unremarkable. Hepatic panel was within normal limits.  HOSPITAL COURSE:  The patient received no x-rays, no special procedures, no additional consultations.  The patient rapidly adapted to the unit routine socializing well with both patients and staff.  She was significantly oppositional and defiant.  Affect and mood were depressed.  She was continued on Zoloft and Ritalin.  These were at subtherapeutic doses, and they were titrated up to a therapeutic range.  At the time of discharge, the patient denies any homicidal or suicidal ideation.  She slipped and fell on the tile floor in her room sustaining a contusion to her right hip.  X-rays were negative following the fall.  The patient will follow up with her pediatrician and her orthopedic surgeon to continue to evaluate the hip on an outpatient basis.  At the present time she denies any homicidal or suicidal ideation, she is  participating in all aspects of the therapeutic treatment program, she is less oppositional and defiant, concentration and attention span as well as hyperactive and impulse control have all improved, she no longer appears to be a danger to herself or others, and is presently participating in all aspects of the therapeutic treatment program, and it is felt that the patient is ready for transition to a less restrictive alternative setting and is discharged to home.  CONDITION ON DISCHARGE:  Improved.  CURRENT DIAGNOSES ACCORDING TO DSM4: Axis I:    1. Major depression, recurrent type, severe, without psychosis.            2. Attention deficit hyperactivity disorder, combined type.            3. Oppositional defiant disorder. Axis II:   None. Axis III:  Right hip contusion. Axis IV:   Current psychosocial stressors are severe. Axis V:    Code 20 on admission, code 30 on discharge.  FOLLOWUP:  The patient will follow up with [facility] for all further aspects of her mental health care and consequently I will sign off on the case at this time.  She will follow up with her orthopedic surgeon and pediatrician for continued evaluation of her right hip contusion.  DISCHARGE MEDICATIONS:  The patient is discharged on: 1. Zoloft 150 mg p.o. q.d. 2. Ritalin 20 mg in the morning and at noon.  FURTHER EVALUATION AND TREATMENT RECOMMENDATIONS: She is discharged on an unrestricted level of activity and a regular diet. DD:  08/08/00 TD:  08/08/00 Job: [id number]
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
## 2011-02-28 ENCOUNTER — Emergency Department (HOSPITAL_COMMUNITY): Payer: Medicare Other

## 2011-02-28 ENCOUNTER — Emergency Department (HOSPITAL_COMMUNITY)
Admission: EM | Admit: 2011-02-28 | Discharge: 2011-03-01 | Disposition: A | Discharge status: Home / Self Care | Payer: Medicare Other | Attending: Emergency Medicine | Admitting: Emergency Medicine

## 2011-02-28 DIAGNOSIS — J351 Hypertrophy of tonsils: Secondary | ICD-10-CM | POA: Insufficient documentation

## 2011-02-28 DIAGNOSIS — I10 Essential (primary) hypertension: Secondary | ICD-10-CM | POA: Insufficient documentation

## 2011-02-28 DIAGNOSIS — F319 Bipolar disorder, unspecified: Secondary | ICD-10-CM | POA: Insufficient documentation

## 2011-02-28 DIAGNOSIS — J069 Acute upper respiratory infection, unspecified: Secondary | ICD-10-CM | POA: Insufficient documentation

## 2011-02-28 DIAGNOSIS — R05 Cough: Secondary | ICD-10-CM | POA: Insufficient documentation

## 2011-02-28 DIAGNOSIS — L298 Other pruritus: Secondary | ICD-10-CM | POA: Insufficient documentation

## 2011-02-28 DIAGNOSIS — R070 Pain in throat: Secondary | ICD-10-CM | POA: Insufficient documentation

## 2011-02-28 DIAGNOSIS — B86 Scabies: Secondary | ICD-10-CM | POA: Insufficient documentation

## 2011-02-28 DIAGNOSIS — R059 Cough, unspecified: Secondary | ICD-10-CM | POA: Insufficient documentation

## 2011-02-28 DIAGNOSIS — R21 Rash and other nonspecific skin eruption: Secondary | ICD-10-CM | POA: Insufficient documentation

## 2011-02-28 DIAGNOSIS — H11419 Vascular abnormalities of conjunctiva, unspecified eye: Secondary | ICD-10-CM | POA: Insufficient documentation

## 2011-02-28 DIAGNOSIS — L2989 Other pruritus: Secondary | ICD-10-CM | POA: Insufficient documentation

## 2011-06-20 ENCOUNTER — Inpatient Hospital Stay (HOSPITAL_COMMUNITY)
Admission: RE | Admit: 2011-06-20 | Discharge: 2011-06-20 | Disposition: A | Discharge status: Home / Self Care | Payer: Medicare Other | Source: Ambulatory Visit | Attending: Family Medicine | Admitting: Family Medicine

## 2011-07-02 LAB — URINALYSIS, ROUTINE W REFLEX MICROSCOPIC
Bilirubin Urine: NEGATIVE
Glucose, UA: NEGATIVE
Hgb urine dipstick: NEGATIVE
Ketones, ur: >80 — AB
Nitrite: NEGATIVE
Protein, ur: NEGATIVE
Specific Gravity, Urine: 1.026
Urobilinogen, UA: 1
pH: 7

## 2011-07-02 LAB — POCT PREGNANCY, URINE
Operator id: 234501
Preg Test, Ur: NEGATIVE

## 2011-07-02 LAB — I-STAT 8, (EC8 V) (CONVERTED LAB)
Acid-base deficit: 2
BUN: 11
Bicarbonate: 22.6
Chloride: 104
Glucose, Bld: 114 — ABNORMAL HIGH
HCT: 43
Hemoglobin: 14.6
Operator id: 234501
Potassium: 3.5
Sodium: 136
TCO2: 24
pCO2, Ven: 38.9 — ABNORMAL LOW
pH, Ven: 7.371 — ABNORMAL HIGH

## 2011-07-02 LAB — DIFFERENTIAL
Basophils Absolute: 0
Basophils Relative: 1
Eosinophils Absolute: 0
Eosinophils Relative: 0
Lymphocytes Relative: 16
Lymphs Abs: 0.8
Monocytes Absolute: 0.4
Monocytes Relative: 9
Neutro Abs: 3.5
Neutrophils Relative %: 74

## 2011-07-02 LAB — POCT I-STAT CREATININE
Creatinine, Ser: 1.1
Operator id: 234501

## 2011-07-02 LAB — CBC
HCT: 38.3
Hemoglobin: 13.2
MCHC: 34.3
MCV: 88.4
Platelets: 233
RBC: 4.34
RDW: 13.9
WBC: 4.8

## 2011-07-05 LAB — URINALYSIS, ROUTINE W REFLEX MICROSCOPIC
Bilirubin Urine: NEGATIVE
Glucose, UA: NEGATIVE
Hgb urine dipstick: NEGATIVE
Ketones, ur: NEGATIVE
Nitrite: NEGATIVE
Protein, ur: NEGATIVE
Specific Gravity, Urine: 1.015
Urobilinogen, UA: 0.2
pH: 7

## 2011-07-05 LAB — GC/CHLAMYDIA PROBE AMP, GENITAL
Chlamydia, DNA Probe: NEGATIVE
GC Probe Amp, Genital: NEGATIVE

## 2011-07-05 LAB — WET PREP, GENITAL
Trich, Wet Prep: NONE SEEN
Yeast Wet Prep HPF POC: NONE SEEN

## 2011-07-05 LAB — POCT URINALYSIS DIP (DEVICE)
Bilirubin Urine: NEGATIVE
Glucose, UA: NEGATIVE
Hgb urine dipstick: NEGATIVE
Ketones, ur: NEGATIVE
Nitrite: NEGATIVE
Operator id: 247071
Protein, ur: 30 — AB
Specific Gravity, Urine: 1.025
Urobilinogen, UA: 1
pH: 6

## 2011-07-05 LAB — POCT PREGNANCY, URINE
Operator id: 247071
Preg Test, Ur: POSITIVE

## 2011-07-23 LAB — URINE MICROSCOPIC-ADD ON

## 2011-07-23 LAB — POCT PREGNANCY, URINE
Operator id: 277751
Preg Test, Ur: NEGATIVE

## 2011-07-23 LAB — DIFFERENTIAL
Basophils Absolute: 0.1
Basophils Relative: 1
Eosinophils Absolute: 0.6
Eosinophils Relative: 8 — ABNORMAL HIGH
Lymphocytes Relative: 59 — ABNORMAL HIGH
Lymphs Abs: 4.3 — ABNORMAL HIGH
Monocytes Absolute: 0.2
Monocytes Relative: 3
Neutro Abs: 2.1
Neutrophils Relative %: 29 — ABNORMAL LOW

## 2011-07-23 LAB — COMPREHENSIVE METABOLIC PANEL
ALT: 21
AST: 25
Albumin: 3.7
Alkaline Phosphatase: 61
BUN: 5 — ABNORMAL LOW
CO2: 26
Calcium: 9.4
Chloride: 106
Creatinine, Ser: 0.58
GFR calc Af Amer: >60
GFR calc non Af Amer: >60
Glucose, Bld: 94
Potassium: 3.7
Sodium: 139
Total Bilirubin: 0.4
Total Protein: 6.6

## 2011-07-23 LAB — CBC
HCT: 35.9 — ABNORMAL LOW
Hemoglobin: 12.1
MCHC: 33.6
MCV: 90.7
Platelets: 327
RBC: 3.96
RDW: 13.7
WBC: 7.3

## 2011-07-23 LAB — URINALYSIS, ROUTINE W REFLEX MICROSCOPIC
Bilirubin Urine: NEGATIVE
Glucose, UA: NEGATIVE
Ketones, ur: NEGATIVE
Leukocytes, UA: NEGATIVE
Nitrite: NEGATIVE
Protein, ur: NEGATIVE
Specific Gravity, Urine: 1.013
Urobilinogen, UA: 0.2
pH: 7

## 2011-07-23 LAB — LIPASE, BLOOD: Lipase: 20

## 2011-07-23 LAB — AMYLASE: Amylase: 64

## 2011-07-24 LAB — URINE MICROSCOPIC-ADD ON

## 2011-07-24 LAB — URINALYSIS, ROUTINE W REFLEX MICROSCOPIC
Bilirubin Urine: NEGATIVE
Glucose, UA: NEGATIVE
Hgb urine dipstick: NEGATIVE
Ketones, ur: NEGATIVE
Leukocytes, UA: NEGATIVE
Nitrite: NEGATIVE
Protein, ur: 100 — AB
Specific Gravity, Urine: 1.014
Urobilinogen, UA: 0.2
pH: 7.5

## 2011-07-24 LAB — PREGNANCY, URINE: Preg Test, Ur: NEGATIVE

## 2011-07-25 LAB — POCT URINALYSIS DIP (DEVICE)
Bilirubin Urine: NEGATIVE
Glucose, UA: NEGATIVE
Hgb urine dipstick: NEGATIVE
Ketones, ur: NEGATIVE
Nitrite: NEGATIVE
Operator id: 235561
Protein, ur: 30 — AB
Specific Gravity, Urine: 1.02
Urobilinogen, UA: 1
pH: 7

## 2011-07-25 LAB — POCT PREGNANCY, URINE
Operator id: 235561
Preg Test, Ur: NEGATIVE

## 2011-07-25 LAB — WET PREP, GENITAL
Trich, Wet Prep: NONE SEEN
Yeast Wet Prep HPF POC: NONE SEEN

## 2011-07-25 LAB — GC/CHLAMYDIA PROBE AMP, GENITAL
Chlamydia, DNA Probe: NEGATIVE
GC Probe Amp, Genital: NEGATIVE

## 2011-08-08 ENCOUNTER — Emergency Department (HOSPITAL_COMMUNITY): Payer: Medicare Other

## 2011-08-08 ENCOUNTER — Emergency Department (HOSPITAL_COMMUNITY)
Admission: EM | Admit: 2011-08-08 | Discharge: 2011-08-08 | Disposition: A | Discharge status: Home / Self Care | Payer: Medicare Other | Attending: Emergency Medicine | Admitting: Emergency Medicine

## 2011-08-08 DIAGNOSIS — R059 Cough, unspecified: Secondary | ICD-10-CM | POA: Insufficient documentation

## 2011-08-08 DIAGNOSIS — R0602 Shortness of breath: Secondary | ICD-10-CM | POA: Insufficient documentation

## 2011-08-08 DIAGNOSIS — E669 Obesity, unspecified: Secondary | ICD-10-CM | POA: Insufficient documentation

## 2011-08-08 DIAGNOSIS — R070 Pain in throat: Secondary | ICD-10-CM | POA: Insufficient documentation

## 2011-08-08 DIAGNOSIS — R509 Fever, unspecified: Secondary | ICD-10-CM | POA: Insufficient documentation

## 2011-08-08 DIAGNOSIS — J40 Bronchitis, not specified as acute or chronic: Secondary | ICD-10-CM | POA: Insufficient documentation

## 2011-08-08 DIAGNOSIS — I10 Essential (primary) hypertension: Secondary | ICD-10-CM | POA: Insufficient documentation

## 2011-08-08 DIAGNOSIS — R05 Cough: Secondary | ICD-10-CM | POA: Insufficient documentation

## 2011-08-08 DIAGNOSIS — F319 Bipolar disorder, unspecified: Secondary | ICD-10-CM | POA: Insufficient documentation

## 2011-08-15 ENCOUNTER — Emergency Department (HOSPITAL_COMMUNITY)
Admission: EM | Admit: 2011-08-15 | Discharge: 2011-08-15 | Discharge status: Against Medical Advice | Payer: Medicare Other | Attending: Emergency Medicine | Admitting: Emergency Medicine

## 2011-08-15 ENCOUNTER — Encounter: Payer: Self-pay | Admitting: *Deleted

## 2011-08-15 DIAGNOSIS — R42 Dizziness and giddiness: Secondary | ICD-10-CM

## 2011-08-15 NOTE — ED Notes (Signed)
Rose Fillers

## 2011-08-15 NOTE — ED Notes (Addendum)
Pt in c/o episodes of dizziness x4 nights, states this only happens at night, increased dizziness tonight

## 2011-08-15 NOTE — ED Notes (Signed)
C/o dizziness x 4 days. Worse at night.  Had LT ear pain 1 wk ago that has gone away.  Was also seen at [facility] for cold/cough on Oct 31-told to take motin and robitussin.  Saw pmd today for the dizziness and was given vit D3 which she started today.  Denies any head/nasal congestion and states at times her vision is blurry.  Had echocardiogram around Oct 5th at PMD's office.

## 2011-08-16 NOTE — ED Provider Notes (Signed)
History     CSN: [id number] Arrival date & time: 08/15/2011  8:53 PM   First MD Initiated Contact with Patient 08/15/11 2145      Chief Complaint  Patient presents with  . Dizziness    (Consider location/radiation/quality/duration/timing/severity/associated sxs/prior treatment) HPI  Past Medical History  Diagnosis Date  . Diabetes mellitus     History reviewed. No pertinent past surgical history.  History reviewed. No pertinent family history.  History  Substance Use Topics  . Smoking status: Never Smoker   . Smokeless tobacco: Not on file  . Alcohol Use: No    OB History    Grav Para Term Preterm Abortions TAB SAB Ect Mult Living                  Review of Systems  Allergies  Pineapple; Strawberry; and Divalproex sodium  Home Medications   Current Outpatient Rx  Name Route Sig Dispense Refill  . ALBUTEROL SULFATE HFA 108 (90 BASE) MCG/ACT IN AERS Inhalation Inhale 2 puffs into the lungs every 6 (six) hours as needed. Shortness of breath    . IBUPROFEN 200 MG PO TABS Oral Take 400 mg by mouth every 6 (six) hours as needed. Pain.     Marland Kitchen VITAMIN D (ERGOCALCIFEROL) 50000 UNITS PO CAPS Oral Take 50,000 Units by mouth once a week. Takes 1 tablet every wednesday       BP 141/88  Pulse 76  Temp(Src) 98.7 F (37.1 C) (Oral)  Resp 20  SpO2 98%  Physical Exam  ED Course  Procedures (including critical care time)  Labs Reviewed - No data to display No results found.   1. Dizziness       MDM    I did not see this pt.  Pt LWBS.         Laray Anger, DO 08/16/11 [phone]

## 2011-11-07 ENCOUNTER — Emergency Department (INDEPENDENT_AMBULATORY_CARE_PROVIDER_SITE_OTHER)
Admission: EM | Admit: 2011-11-07 | Discharge: 2011-11-07 | Disposition: A | Discharge status: Home / Self Care | Payer: Medicare Other | Source: Home / Self Care | Attending: Emergency Medicine | Admitting: Emergency Medicine

## 2011-11-07 ENCOUNTER — Encounter (HOSPITAL_COMMUNITY): Payer: Self-pay

## 2011-11-07 DIAGNOSIS — L0390 Cellulitis, unspecified: Secondary | ICD-10-CM

## 2011-11-07 DIAGNOSIS — L0291 Cutaneous abscess, unspecified: Secondary | ICD-10-CM

## 2011-11-07 HISTORY — DX: Bipolar disorder, unspecified: F31.9

## 2011-11-07 MED ORDER — IBUPROFEN 600 MG PO TABS: 600.0000 mg | ORAL_TABLET | Freq: Four times a day (QID) | ORAL | Status: AC | PRN

## 2011-11-07 MED ORDER — MUPIROCIN 2 % EX OINT: TOPICAL_OINTMENT | Freq: Three times a day (TID) | CUTANEOUS | Status: AC

## 2011-11-07 MED ORDER — SULFAMETHOXAZOLE-TRIMETHOPRIM 800-160 MG PO TABS: 1.0000 | ORAL_TABLET | Freq: Two times a day (BID) | ORAL | Status: AC

## 2011-11-07 NOTE — ED Notes (Signed)
C/o 2 day duration of pain, swelling, drainage on her left inner thigh; pain in upper t-spine/shoulders area

## 2011-11-07 NOTE — ED Provider Notes (Signed)
History     CSN: [id number]  Arrival date & time 11/07/11  1050   First MD Initiated Contact with Patient 11/07/11 1141      Chief Complaint  Patient presents with  . Cellulitis    (Consider location/radiation/quality/duration/timing/severity/associated sxs/prior treatment) HPI Comments: Patient with painful, erythematous mass over her left medial upper by starting 2 days ago. Does not recall any trauma to the area. Does not shave in this area. Reports spontaneous drainage starting yesterday. No nausea, vomiting, fevers, erythematous streaking up the extremity, blisters, other, similar lesions currently.Marland Kitchen Has history of skin infections in this location. Patient is diabetic, but states her sugars have been within normal limits recently.  ROS as noted in HPI. All other ROS negative.   Patient is a 26 y.o. female presenting with abscess. The history is provided by the patient.  Abscess  This is a new problem. The abscess is present on the left upper leg. The abscess is characterized by painfulness, redness and draining. It is unknown what she was exposed to.    Past Medical History  Diagnosis Date  . Diabetes mellitus   . Asthma   . Bipolar 1 disorder     History reviewed. No pertinent past surgical history.  History reviewed. No pertinent family history.  History  Substance Use Topics  . Smoking status: Current Everyday Smoker  . Smokeless tobacco: Not on file  . Alcohol Use: No    OB History    Grav Para Term Preterm Abortions TAB SAB Ect Mult Living                  Review of Systems  Allergies  Pineapple; Strawberry; and Divalproex sodium  Home Medications   Current Outpatient Rx  Name Route Sig Dispense Refill  . ALBUTEROL SULFATE HFA 108 (90 BASE) MCG/ACT IN AERS Inhalation Inhale 2 puffs into the lungs every 6 (six) hours as needed. Shortness of breath    . LAMOTRIGINE 200 MG PO TABS Oral Take 200 mg by mouth daily.    . IBUPROFEN 600 MG PO TABS Oral  Take 1 tablet (600 mg total) by mouth every 6 (six) hours as needed for pain. 30 tablet 0  . MUPIROCIN 2 % EX OINT Topical Apply topically 3 (three) times daily. Apply after warm soak for 10 minutes 22 g 0  . SULFAMETHOXAZOLE-TRIMETHOPRIM 800-160 MG PO TABS Oral Take 1 tablet by mouth 2 (two) times daily. 20 tablet 0  . VITAMIN D (ERGOCALCIFEROL) 50000 UNITS PO CAPS Oral Take 50,000 Units by mouth once a week. Takes 1 tablet every wednesday       BP 133/84  Pulse 80  Temp(Src) 98.2 F (36.8 C) (Oral)  Resp 16  SpO2 100%  Physical Exam  Nursing note and vitals reviewed. Constitutional: She is oriented to person, place, and time. She appears well-developed and well-nourished. No distress.  HENT:  Head: Normocephalic and atraumatic.  Eyes: Conjunctivae and EOM are normal.  Neck: Normal range of motion.  Cardiovascular: Regular rhythm.   Pulmonary/Chest: Effort normal.  Abdominal: She exhibits no distension.  Musculoskeletal: Normal range of motion.  Lymphadenopathy:       Right: Inguinal adenopathy present.       Left: Inguinal adenopathy present.  Neurological: She is alert and oriented to person, place, and time.  Skin: Skin is warm and dry.       1X1 centimeter tender area of induration upper left medial thigh, central area with spontaneous serosanguineous drainage. No surrounding  erythema.  Psychiatric: She has a normal mood and affect. Her behavior is normal. Judgment and thought content normal.    ED Course  Procedures (including critical care time)  Labs Reviewed - No data to display No results found.   1. Abscess     MDM  Patient assets that is spontaneously draining. Marked area with permanent marker for reference. Starting patient on Bactrim, ibuprofen, Bactroban. Patient to followup with her doctor or here in 2 days if no improvement. Patient agrees with plan  Luiz Blare, MD 11/07/11 1229

## 2012-03-09 ENCOUNTER — Encounter (HOSPITAL_COMMUNITY): Payer: Self-pay | Admitting: Emergency Medicine

## 2012-03-09 ENCOUNTER — Emergency Department (INDEPENDENT_AMBULATORY_CARE_PROVIDER_SITE_OTHER)
Admission: EM | Admit: 2012-03-09 | Discharge: 2012-03-09 | Disposition: A | Discharge status: Home / Self Care | Payer: Medicare Other | Source: Home / Self Care | Attending: Emergency Medicine | Admitting: Emergency Medicine

## 2012-03-09 ENCOUNTER — Emergency Department (HOSPITAL_COMMUNITY)
Admission: EM | Admit: 2012-03-09 | Discharge: 2012-03-09 | Disposition: A | Discharge status: Home / Self Care | Payer: Medicare HMO | Attending: Emergency Medicine | Admitting: Emergency Medicine

## 2012-03-09 DIAGNOSIS — R0602 Shortness of breath: Secondary | ICD-10-CM | POA: Insufficient documentation

## 2012-03-09 DIAGNOSIS — T7840XA Allergy, unspecified, initial encounter: Secondary | ICD-10-CM

## 2012-03-09 MED ORDER — PREDNISONE 10 MG PO TABS: 50.0000 mg | ORAL_TABLET | Freq: Every day | ORAL | Status: DC

## 2012-03-09 MED ORDER — PREDNISONE 20 MG PO TABS
ORAL_TABLET | ORAL | Status: AC
  Filled 2012-03-09: qty 3

## 2012-03-09 MED ORDER — DIPHENHYDRAMINE HCL 25 MG PO CAPS
25.0000 mg | ORAL_CAPSULE | Freq: Once | ORAL | Status: AC
  Administered 2012-03-09: 25 mg via ORAL

## 2012-03-09 MED ORDER — DIPHENHYDRAMINE HCL 25 MG PO CAPS
ORAL_CAPSULE | ORAL | Status: AC
  Filled 2012-03-09: qty 1

## 2012-03-09 MED ORDER — PREDNISONE 20 MG PO TABS
60.0000 mg | ORAL_TABLET | Freq: Once | ORAL | Status: AC
  Administered 2012-03-09: 60 mg via ORAL

## 2012-03-09 NOTE — ED Notes (Signed)
Patient reports a bee sting last night on right forearm.  Then this morning woke and had hives.  Some areas look like blisters.  Areas on fingers arms, legs, face, trunk..patient scratching all over, some larger bumps are weeping clear liquid

## 2012-03-09 NOTE — ED Provider Notes (Signed)
History     CSN: [id number]  Arrival date & time 03/09/12  1713   First MD Initiated Contact with Patient 03/09/12 1714      Chief Complaint  Patient presents with  . Insect Bite    (Consider location/radiation/quality/duration/timing/severity/associated sxs/prior treatment) HPI Comments: Pt was stung by a bee on R forearm while outside an apartment complex yesterday.  Was not in the woods or near poison ivy.  Since then, area of sting has been itchy, and new areas have appeared on other places on her body that are itchy and "whelps"  Patient is a 26 y.o. female presenting with rash. The history is provided by the patient.  Rash  This is a new problem. The current episode started yesterday. The problem has been gradually worsening. The problem is associated with an insect bite/sting. There has been no fever. The rash is present on the right lower leg, back, left arm and right arm. The pain is at a severity of 9/10. Associated symptoms include blisters and itching. She has tried antihistamines (took one dose of benadryl this morning) for the symptoms. The treatment provided no relief.    Past Medical History  Diagnosis Date  . Diabetes mellitus   . Asthma   . Bipolar 1 disorder     History reviewed. No pertinent past surgical history.  No family history on file.  History  Substance Use Topics  . Smoking status: Current Everyday Smoker  . Smokeless tobacco: Not on file  . Alcohol Use: No    OB History    Grav Para Term Preterm Abortions TAB SAB Ect Mult Living                  Review of Systems  Constitutional: Negative for fever and chills.  HENT: Negative for trouble swallowing.   Respiratory: Negative for shortness of breath and wheezing.   Skin: Positive for itching and rash.    Allergies  Pineapple; Strawberry; and Divalproex sodium  Home Medications   Current Outpatient Rx  Name Route Sig Dispense Refill  . ALBUTEROL SULFATE HFA 108 (90 BASE) MCG/ACT IN  AERS Inhalation Inhale 2 puffs into the lungs every 6 (six) hours as needed. Shortness of breath    . DIPHENHYDRAMINE HCL 25 MG PO CAPS Oral Take 50 mg by mouth once.    Marland Kitchen LAMOTRIGINE 200 MG PO TABS Oral Take 200 mg by mouth daily.    Marland Kitchen PREDNISONE 10 MG PO TABS Oral Take 5 tablets (50 mg total) by mouth daily. 15 tablet 0  . VITAMIN D (ERGOCALCIFEROL) 50000 UNITS PO CAPS Oral Take 50,000 Units by mouth once a week. Takes 1 tablet every Friday      BP 128/85  Pulse 105  Temp(Src) 98.7 F (37.1 C) (Oral)  Resp 20  SpO2 99%  Physical Exam  Constitutional: She appears well-developed and well-nourished. No distress.       Constantly scratching  Cardiovascular: Normal rate and regular rhythm.   Pulmonary/Chest: Effort normal and breath sounds normal. She has no wheezes.  Skin: Skin is warm and dry. Rash noted. Rash is vesicular and urticarial.       Multiple large raised areas on back, arms and legs with central scab, open area, or vesicle.  Raised areas are 1-2.5cm in diameter.  Central areas are 2-57mm.  On right fingers there are 3x41mm blisters.  No evidence infection.     ED Course  Procedures (including critical care time)  Labs Reviewed - No  data to display No results found.   1. Allergic reaction       MDM  Pt claims only to have been stung once on R forearm, but rash areas appear c/w insect bites/stings as if she was bitten multiple times.  Will treat for allergic reaction.         Cathlyn Parsons, NP 03/09/12 1843

## 2012-03-09 NOTE — ED Notes (Signed)
Pt reports stung by bee yesterday on right arm. Today pt noticed whelps all over her body. Pt reports had shortness of breath but took her inhaler on the way over here, shortness of breath relieved.

## 2012-03-09 NOTE — ED Notes (Signed)
Called in all waiting rooms, unable to locate pt

## 2012-03-09 NOTE — ED Notes (Signed)
Patient's friend is available to drive patient home

## 2012-03-09 NOTE — Discharge Instructions (Signed)
Use benadryl as instructed on the bottle for itching.  The prednisone should stop the itching/rash; if you feel like your rash is not getting better, see your doctor or you can return here to Urgent Care.     Allergic Reaction Allergic reactions can be caused by anything your body is sensitive to. Your body may be sensitive to food, medicines, molds, pollens, cockroaches, dust mites, pets, insect stings, and other things around you. An allergic reaction may cause puffiness (swelling), itching, sneezing, coughing, or problems breathing.  Allergies cannot be cured, but they can be controlled with medicine. Some allergies happen only at certain times of the year. Try to stay away from what causes your reaction if possible. Sometimes, it is hard to tell what causes your reaction. HOME CARE If you have a rash or red patches (hives) on your skin:  Take medicines as told by your doctor.   Do not drive or drink alcohol after taking medicines. They can make you sleepy.   Put cold cloths on your skin. Take baths in cool water. This will help your itching. Do not take hot baths or showers. Heat will make the itching worse.   If your allergies get worse, your doctor might give you other medicines. Talk to your doctor if problems continue.  GET HELP RIGHT AWAY IF:   You have trouble breathing.   You have a tight feeling in your chest or throat.   Your mouth gets puffy (swollen).   You have red, itchy patches on your skin (hives) that get worse.   You have itching all over your body.  MAKE SURE YOU:   Understand these instructions.   Will watch your condition.   Will get help right away if you are not doing well or get worse.  Document Released: 09/12/2009 Document Revised: 09/13/2011 Document Reviewed: 09/12/2009 [facility] Patient Information 2012 [place], [place].

## 2012-03-09 NOTE — ED Notes (Signed)
Given ice packs for particularly itchy areas.  Patient has a driver

## 2012-03-09 NOTE — ED Notes (Signed)
3rd attempt to locate pt, pt is not in ED dept.

## 2012-03-09 NOTE — ED Notes (Signed)
Unable to locate pt x2

## 2012-03-09 NOTE — ED Provider Notes (Signed)
Medical screening examination/treatment/procedure(s) were performed by non-physician practitioner and as supervising physician I was immediately available for consultation/collaboration.  Skie Vitrano, M.D.   Jasiel Apachito C Osias Resnick, MD 03/09/12 2034 

## 2012-06-16 ENCOUNTER — Emergency Department (HOSPITAL_COMMUNITY)
Admission: EM | Admit: 2012-06-16 | Discharge: 2012-06-17 | Disposition: A | Discharge status: Home / Self Care | Payer: Medicare HMO | Attending: Emergency Medicine | Admitting: Emergency Medicine

## 2012-06-16 ENCOUNTER — Encounter (HOSPITAL_COMMUNITY): Payer: Self-pay | Admitting: Pediatric Emergency Medicine

## 2012-06-16 DIAGNOSIS — Z888 Allergy status to other drugs, medicaments and biological substances status: Secondary | ICD-10-CM | POA: Insufficient documentation

## 2012-06-16 DIAGNOSIS — J45909 Unspecified asthma, uncomplicated: Secondary | ICD-10-CM | POA: Insufficient documentation

## 2012-06-16 DIAGNOSIS — F17200 Nicotine dependence, unspecified, uncomplicated: Secondary | ICD-10-CM | POA: Insufficient documentation

## 2012-06-16 DIAGNOSIS — S61259A Open bite of unspecified finger without damage to nail, initial encounter: Secondary | ICD-10-CM

## 2012-06-16 DIAGNOSIS — S61209A Unspecified open wound of unspecified finger without damage to nail, initial encounter: Secondary | ICD-10-CM | POA: Insufficient documentation

## 2012-06-16 DIAGNOSIS — Z91018 Allergy to other foods: Secondary | ICD-10-CM | POA: Insufficient documentation

## 2012-06-16 DIAGNOSIS — W540XXA Bitten by dog, initial encounter: Secondary | ICD-10-CM | POA: Insufficient documentation

## 2012-06-16 DIAGNOSIS — E119 Type 2 diabetes mellitus without complications: Secondary | ICD-10-CM | POA: Insufficient documentation

## 2012-06-16 NOTE — ED Notes (Signed)
Called with no response

## 2012-06-16 NOTE — ED Notes (Signed)
Per pt she was bitten on the right thumb by a dog this evening.  There is no visible sign of puncture.  Pt is alert and oriented.

## 2012-06-17 NOTE — ED Provider Notes (Signed)
History     CSN: [id number]  Arrival date & time 06/16/12  2222   First MD Initiated Contact with Patient 06/16/12 2357      Chief Complaint  Patient presents with  . Animal Bite    (Consider location/radiation/quality/duration/timing/severity/associated sxs/prior treatment) HPI Comments: Patient states she was walking in the street with her daughter when a neighborhood dog came up and snapped at them.  She states she grabbed her daughter back from the dog and thinks she may have been bitten on the right thumb.  There is no brachium.  Skin.  There is no bruising.  There is some ungual hematoma.  There is no joint swelling  Patient is a 26 y.o. female presenting with animal bite. The history is provided by the patient.  Animal Bite  The incident occurred just prior to arrival. The incident occurred in the street. There is an injury to the right thumb. The patient is experiencing no pain. It is unlikely that a foreign body is present. Pertinent negatives include no numbness, no nausea and no weakness.    Past Medical History  Diagnosis Date  . Diabetes mellitus   . Asthma   . Bipolar 1 disorder     History reviewed. No pertinent past surgical history.  No family history on file.  History  Substance Use Topics  . Smoking status: Current Everyday Smoker  . Smokeless tobacco: Not on file  . Alcohol Use: No    OB History    Grav Para Term Preterm Abortions TAB SAB Ect Mult Living                  Review of Systems  Constitutional: Negative for fever and chills.  Gastrointestinal: Negative for nausea.  Musculoskeletal: Negative for joint swelling.  Skin: Negative for wound.  Neurological: Negative for dizziness, weakness and numbness.    Allergies  Pineapple; Strawberry; and Divalproex sodium  Home Medications   Current Outpatient Rx  Name Route Sig Dispense Refill  . FLUOXETINE HCL 10 MG PO TABS Oral Take 10 mg by mouth daily.    . ALBUTEROL SULFATE HFA 108 (90  BASE) MCG/ACT IN AERS Inhalation Inhale 2 puffs into the lungs every 6 (six) hours as needed. Shortness of breath    . DIPHENHYDRAMINE HCL 25 MG PO CAPS Oral Take 50 mg by mouth once.    Marland Kitchen LAMOTRIGINE 200 MG PO TABS Oral Take 200 mg by mouth daily.    Marland Kitchen PREDNISONE 10 MG PO TABS Oral Take 5 tablets (50 mg total) by mouth daily. 15 tablet 0  . VITAMIN D (ERGOCALCIFEROL) 50000 UNITS PO CAPS Oral Take 50,000 Units by mouth once a week. Takes 1 tablet every Friday      BP 124/79  Pulse 90  Temp 98.1 F (36.7 C) (Oral)  Wt 255 lb (115.667 kg)  SpO2 98%  Physical Exam  Constitutional: She is oriented to person, place, and time. She appears well-developed and well-nourished.  HENT:  Head: Normocephalic.  Eyes: Pupils are equal, round, and reactive to light.  Neck: Normal range of motion.  Cardiovascular: Normal rate.   Pulmonary/Chest: Effort normal.  Musculoskeletal: Normal range of motion. She exhibits no edema and no tenderness.  Neurological: She is alert and oriented to person, place, and time.  Skin: Skin is warm and dry. No erythema.       There is no skin there is no bruising.  There is no subchondral hematoma.  There is no deformity.  There  is no joint swelling of the right thumb, where patient indicates she was "bitten" by the dog    ED Course  Procedures (including critical care time)  Labs Reviewed - No data to display No results found.   No diagnosis found.    MDM  Reported, animal bite to right thumb, although I think this is highly unlikely.  No breaks in the skin, no subungual hematoma, no joint swelling or deformity         Arman Filter, NP 06/17/12 0019

## 2012-06-17 NOTE — ED Provider Notes (Signed)
Medical screening examination/treatment/procedure(s) were performed by non-physician practitioner and as supervising physician I was immediately available for consultation/collaboration.    Vida Roller, MD 06/17/12 [phone]

## 2012-08-07 ENCOUNTER — Encounter (HOSPITAL_COMMUNITY): Payer: Self-pay | Admitting: Adult Health

## 2012-08-07 ENCOUNTER — Emergency Department (HOSPITAL_COMMUNITY)
Admission: EM | Admit: 2012-08-07 | Discharge: 2012-08-07 | Disposition: A | Discharge status: Home / Self Care | Payer: Medicare HMO | Attending: Emergency Medicine | Admitting: Emergency Medicine

## 2012-08-07 DIAGNOSIS — F17200 Nicotine dependence, unspecified, uncomplicated: Secondary | ICD-10-CM | POA: Insufficient documentation

## 2012-08-07 DIAGNOSIS — F319 Bipolar disorder, unspecified: Secondary | ICD-10-CM | POA: Insufficient documentation

## 2012-08-07 DIAGNOSIS — Y9289 Other specified places as the place of occurrence of the external cause: Secondary | ICD-10-CM | POA: Insufficient documentation

## 2012-08-07 DIAGNOSIS — R296 Repeated falls: Secondary | ICD-10-CM | POA: Insufficient documentation

## 2012-08-07 DIAGNOSIS — W57XXXA Bitten or stung by nonvenomous insect and other nonvenomous arthropods, initial encounter: Secondary | ICD-10-CM

## 2012-08-07 DIAGNOSIS — J45909 Unspecified asthma, uncomplicated: Secondary | ICD-10-CM | POA: Insufficient documentation

## 2012-08-07 DIAGNOSIS — Y9389 Activity, other specified: Secondary | ICD-10-CM | POA: Insufficient documentation

## 2012-08-07 DIAGNOSIS — E119 Type 2 diabetes mellitus without complications: Secondary | ICD-10-CM | POA: Insufficient documentation

## 2012-08-07 DIAGNOSIS — T6391XA Toxic effect of contact with unspecified venomous animal, accidental (unintentional), initial encounter: Secondary | ICD-10-CM | POA: Insufficient documentation

## 2012-08-07 MED ORDER — DIPHENHYDRAMINE HCL 25 MG PO CAPS: 25.0000 mg | ORAL_CAPSULE | Freq: Four times a day (QID) | ORAL | Status: DC | PRN

## 2012-08-07 MED ORDER — FLUORESCEIN SODIUM 1 MG OP STRP
1.0000 | ORAL_STRIP | Freq: Once | OPHTHALMIC | Status: AC
  Administered 2012-08-07: 1 via OPHTHALMIC
  Filled 2012-08-07: qty 1

## 2012-08-07 MED ORDER — DIPHENHYDRAMINE HCL 25 MG PO CAPS
25.0000 mg | ORAL_CAPSULE | Freq: Once | ORAL | Status: AC
  Administered 2012-08-07: 25 mg via ORAL
  Filled 2012-08-07: qty 1

## 2012-08-07 NOTE — ED Notes (Signed)
Reports walking into a bush while trick or treating and getting bitten in eye by a spider. Right eye swollen.

## 2012-08-07 NOTE — ED Provider Notes (Signed)
History     CSN: [id number]  Arrival date & time 08/07/12  7829   First MD Initiated Contact with Patient 08/07/12 2001      Chief Complaint  Patient presents with  . Eye Injury    (Consider location/radiation/quality/duration/timing/severity/associated sxs/prior treatment) HPI Comments: States she was struck her treating with her child.  Tonight, about 6:00, when she walked into a bush and saw a Sport and exercise psychologist and knows that a spider jumped into her eye, and bit it it's now burning.  She does have some upper lid swelling denies visual disturbance  Patient is a 26 y.o. female presenting with eye injury. The history is provided by the patient.  Eye Injury This is a new problem. The current episode started today. The problem has been unchanged. Pertinent negatives include no fever, headaches, myalgias or weakness.    Past Medical History  Diagnosis Date  . Diabetes mellitus   . Asthma   . Bipolar 1 disorder     History reviewed. No pertinent past surgical history.  History reviewed. No pertinent family history.  History  Substance Use Topics  . Smoking status: Current Every Day Smoker  . Smokeless tobacco: Not on file  . Alcohol Use: No    OB History    Grav Para Term Preterm Abortions TAB SAB Ect Mult Living                  Review of Systems  Constitutional: Negative for fever.  Eyes: Positive for pain and itching. Negative for visual disturbance.  Musculoskeletal: Negative for myalgias.  Neurological: Negative for dizziness, weakness and headaches.    Allergies  Pineapple; Strawberry; and Divalproex sodium  Home Medications   Current Outpatient Rx  Name Route Sig Dispense Refill  . ALBUTEROL SULFATE HFA 108 (90 BASE) MCG/ACT IN AERS Inhalation Inhale 2 puffs into the lungs every 6 (six) hours as needed. Shortness of breath    . FLUOXETINE HCL 20 MG PO CAPS Oral Take 20 mg by mouth daily.    Marland Kitchen LAMOTRIGINE 25 MG PO TABS Oral Take 50 mg by mouth daily.    Marland Kitchen  VITAMIN D (ERGOCALCIFEROL) 50000 UNITS PO CAPS Oral Take 50,000 Units by mouth once a week. Takes 1 tablet every Friday    . DIPHENHYDRAMINE HCL 25 MG PO CAPS Oral Take 1 capsule (25 mg total) by mouth every 6 (six) hours as needed for itching. 30 capsule 0    BP 138/79  Pulse 93  Temp 98.5 F (36.9 C) (Oral)  Resp 20  SpO2 96%  Physical Exam  Constitutional: She appears well-developed and well-nourished.  HENT:  Head: Normocephalic.  Eyes: EOM are normal. Pupils are equal, round, and reactive to light.  Slit lamp exam:      The right eye shows no corneal abrasion, no corneal flare, no corneal ulcer, no foreign body, no fluorescein uptake and no anterior chamber bulge.       R Upper eyelid slightly edematous    ED Course  Procedures (including critical care time)  Labs Reviewed - No data to display No results found.   1. Insect bite       MDM   Indication of injury to the eye globe.  This is most likely a lid reaction.  She may or may not have been bitten by a spider on her eyelid        Arman Filter, NP 08/07/12 2044

## 2012-08-07 NOTE — ED Notes (Signed)
Pt. Informed about no driving policy after taking benadryl. Pt verbalized understanding.

## 2012-08-07 NOTE — ED Notes (Signed)
Pt given ice pack

## 2012-08-08 NOTE — ED Provider Notes (Signed)
Medical screening examination/treatment/procedure(s) were performed by non-physician practitioner and as supervising physician I was immediately available for consultation/collaboration.   Joya Gaskins, MD 08/08/12 [phone]

## 2012-09-25 ENCOUNTER — Emergency Department (HOSPITAL_COMMUNITY)
Admission: EM | Admit: 2012-09-25 | Discharge: 2012-09-26 | Disposition: A | Discharge status: Home / Self Care | Payer: Medicare HMO | Attending: Emergency Medicine | Admitting: Emergency Medicine

## 2012-09-25 ENCOUNTER — Encounter (HOSPITAL_COMMUNITY): Payer: Self-pay | Admitting: *Deleted

## 2012-09-25 DIAGNOSIS — Z79899 Other long term (current) drug therapy: Secondary | ICD-10-CM | POA: Insufficient documentation

## 2012-09-25 DIAGNOSIS — R209 Unspecified disturbances of skin sensation: Secondary | ICD-10-CM | POA: Insufficient documentation

## 2012-09-25 DIAGNOSIS — R202 Paresthesia of skin: Secondary | ICD-10-CM

## 2012-09-25 DIAGNOSIS — H53149 Visual discomfort, unspecified: Secondary | ICD-10-CM | POA: Insufficient documentation

## 2012-09-25 DIAGNOSIS — F17200 Nicotine dependence, unspecified, uncomplicated: Secondary | ICD-10-CM | POA: Insufficient documentation

## 2012-09-25 DIAGNOSIS — J45909 Unspecified asthma, uncomplicated: Secondary | ICD-10-CM | POA: Insufficient documentation

## 2012-09-25 DIAGNOSIS — E119 Type 2 diabetes mellitus without complications: Secondary | ICD-10-CM | POA: Insufficient documentation

## 2012-09-25 DIAGNOSIS — F319 Bipolar disorder, unspecified: Secondary | ICD-10-CM | POA: Insufficient documentation

## 2012-09-25 DIAGNOSIS — M7989 Other specified soft tissue disorders: Secondary | ICD-10-CM | POA: Insufficient documentation

## 2012-09-25 DIAGNOSIS — R51 Headache: Secondary | ICD-10-CM | POA: Insufficient documentation

## 2012-09-25 NOTE — ED Notes (Addendum)
Pt c/o right arm numbness after lying on left side this evening. C/o headaches with photophobia recently, never been dx with migraines.  Sensation intact, pt moving arm.

## 2012-09-26 NOTE — ED Notes (Signed)
MD at bedside. 

## 2012-09-26 NOTE — ED Provider Notes (Signed)
History     CSN: [id number]  Arrival date & time 09/25/12  2246   First MD Initiated Contact with Patient 09/25/12 2352      Chief Complaint  Patient presents with  . Headache  . Numbness    (Consider location/radiation/quality/duration/timing/severity/associated sxs/prior treatment) HPI Comments: [name] states she awoke tonight with numbness in her right arm.  It felt heavy and "asleep".  Within minutes, it felt warm and then developed a pins and needles sensation.  Shortly thereafter, the sensation completely resolved and the arm felt normal again.  She states she was alarmed because she awoke on her left side and did not think she had slept on her right side.  She denies any facial weakness, difficulty speaking or forming words, visual changes, difficulty swallowing, leg weakness or clumsiness.  She denies any CP, SOB, palpitations.  The history is provided by the patient. No language interpreter was used.    Past Medical History  Diagnosis Date  . Diabetes mellitus   . Asthma   . Bipolar 1 disorder     History reviewed. No pertinent past surgical history.  History reviewed. No pertinent family history.  History  Substance Use Topics  . Smoking status: Current Every Day Smoker  . Smokeless tobacco: Not on file  . Alcohol Use: No    OB History    Grav Para Term Preterm Abortions TAB SAB Ect Mult Living                  Review of Systems  HENT: Negative for hearing loss, congestion, sore throat, facial swelling, rhinorrhea, drooling, trouble swallowing, neck pain, neck stiffness, voice change and tinnitus.   Eyes: Negative for photophobia, pain and visual disturbance.  Respiratory: Negative for cough, chest tightness and shortness of breath.   Cardiovascular: Positive for leg swelling (chronic). Negative for chest pain and palpitations.  Gastrointestinal: Negative for nausea, vomiting, abdominal pain and diarrhea.  Genitourinary: Negative.   Musculoskeletal:  Negative.   Neurological: Positive for numbness (resolved and isolated to the right arm) and headaches (resolved). Negative for dizziness, tremors, seizures, syncope, facial asymmetry, weakness and light-headedness.  Psychiatric/Behavioral: Negative for dysphoric mood. The patient is not nervous/anxious.     Allergies  Pineapple; Strawberry; and Divalproex sodium  Home Medications   Current Outpatient Rx  Name  Route  Sig  Dispense  Refill  . ALBUTEROL SULFATE HFA 108 (90 BASE) MCG/ACT IN AERS   Inhalation   Inhale 2 puffs into the lungs every 6 (six) hours as needed. Shortness of breath         . DULOXETINE HCL 30 MG PO CPEP   Oral   Take 30 mg by mouth daily.         Marland Kitchen LAMOTRIGINE 100 MG PO TABS   Oral   Take 100 mg by mouth daily.         Marland Kitchen VITAMIN D (ERGOCALCIFEROL) 50000 UNITS PO CAPS   Oral   Take 50,000 Units by mouth once a week. Takes 1 tablet every Friday           BP 109/67  Pulse 71  Temp 97.9 F (36.6 C) (Oral)  Resp 20  SpO2 98%  Physical Exam  Nursing note and vitals reviewed. Constitutional: She is oriented to person, place, and time. She appears well-developed and well-nourished. No distress.  HENT:  Head: Normocephalic and atraumatic.  Right Ear: External ear normal.  Left Ear: External ear normal.  Nose: Nose normal.  Mouth/Throat: Oropharynx is clear and moist. No oropharyngeal exudate.  Eyes: Conjunctivae normal and EOM are normal. Pupils are equal, round, and reactive to light. Right eye exhibits no discharge. Left eye exhibits no discharge. No scleral icterus.  Neck: Normal range of motion. Neck supple. No JVD present. No tracheal deviation present.  Cardiovascular: Normal rate, regular rhythm, normal heart sounds and intact distal pulses.  Exam reveals no gallop and no friction rub.   No murmur heard. Pulmonary/Chest: Effort normal and breath sounds normal. No stridor. No respiratory distress. She has no wheezes. She has no rales. She  exhibits no tenderness.  Abdominal: Soft. Bowel sounds are normal. She exhibits no distension and no mass. There is no tenderness. There is no rebound and no guarding.  Musculoskeletal: Normal range of motion. She exhibits edema (trace bilateral). She exhibits no tenderness.  Lymphadenopathy:    She has no cervical adenopathy.  Neurological: She is alert and oriented to person, place, and time. She has normal strength. She displays no atrophy, no tremor and normal reflexes. No cranial nerve deficit or sensory deficit. She exhibits normal muscle tone. She displays no seizure activity. Coordination (nl finger to nose) and gait normal. GCS eye subscore is 4. GCS verbal subscore is 5. GCS motor subscore is 6. She displays no Babinski's sign on the right side. She displays no Babinski's sign on the left side.       No facial asymmetry.    Skin: Skin is warm. No rash noted. She is not diaphoretic. No erythema. No pallor.  Psychiatric: She has a normal mood and affect. Her behavior is normal.    ED Course  Procedures (including critical care time)  Labs Reviewed - No data to display No results found.   No diagnosis found.    MDM  Pt presents for evaluation of numbness of her right arm which has resolved.  She has stable VS and no focal neurologic deficits.   She has experienced no further issues and the symptoms resolved within minutes of her waking.  She does have risk factors for cardiovascular disease and CVA by proxy but at this time this appears to have been a relatively nonspecific event.  Will not pursue further testing at this point. She has been advised that if she experiences any further similar events or stroke-like symptoms, to return immediately to the emergency department for further evaluation.        Tobin Chad, MD 09/26/12 [phone]

## 2013-01-11 ENCOUNTER — Emergency Department (HOSPITAL_COMMUNITY)
Admission: EM | Admit: 2013-01-11 | Discharge: 2013-01-12 | Disposition: A | Discharge status: Home / Self Care | Payer: Medicare Other | Attending: Emergency Medicine | Admitting: Emergency Medicine

## 2013-01-11 ENCOUNTER — Encounter (HOSPITAL_COMMUNITY): Payer: Self-pay | Admitting: Emergency Medicine

## 2013-01-11 DIAGNOSIS — J45909 Unspecified asthma, uncomplicated: Secondary | ICD-10-CM | POA: Insufficient documentation

## 2013-01-11 DIAGNOSIS — R110 Nausea: Secondary | ICD-10-CM | POA: Insufficient documentation

## 2013-01-11 DIAGNOSIS — Z79899 Other long term (current) drug therapy: Secondary | ICD-10-CM | POA: Insufficient documentation

## 2013-01-11 DIAGNOSIS — E119 Type 2 diabetes mellitus without complications: Secondary | ICD-10-CM | POA: Insufficient documentation

## 2013-01-11 DIAGNOSIS — Z3202 Encounter for pregnancy test, result negative: Secondary | ICD-10-CM | POA: Insufficient documentation

## 2013-01-11 DIAGNOSIS — F319 Bipolar disorder, unspecified: Secondary | ICD-10-CM | POA: Insufficient documentation

## 2013-01-11 DIAGNOSIS — F4320 Adjustment disorder, unspecified: Secondary | ICD-10-CM | POA: Insufficient documentation

## 2013-01-11 DIAGNOSIS — F17200 Nicotine dependence, unspecified, uncomplicated: Secondary | ICD-10-CM | POA: Insufficient documentation

## 2013-01-11 DIAGNOSIS — G43909 Migraine, unspecified, not intractable, without status migrainosus: Secondary | ICD-10-CM | POA: Insufficient documentation

## 2013-01-11 DIAGNOSIS — F39 Unspecified mood [affective] disorder: Secondary | ICD-10-CM | POA: Insufficient documentation

## 2013-01-11 DIAGNOSIS — R42 Dizziness and giddiness: Secondary | ICD-10-CM | POA: Insufficient documentation

## 2013-01-11 LAB — GLUCOSE, CAPILLARY: Glucose-Capillary: 110 mg/dL — ABNORMAL HIGH (ref 70–99)

## 2013-01-11 MED ORDER — ALPRAZOLAM 0.25 MG PO TABS
0.2500 mg | ORAL_TABLET | Freq: Once | ORAL | Status: AC
  Administered 2013-01-12: 0.25 mg via ORAL
  Filled 2013-01-11: qty 1

## 2013-01-11 MED ORDER — METOCLOPRAMIDE HCL 10 MG PO TABS
10.0000 mg | ORAL_TABLET | Freq: Once | ORAL | Status: AC
  Administered 2013-01-12: 10 mg via ORAL
  Filled 2013-01-11: qty 1

## 2013-01-11 MED ORDER — TRAMADOL HCL 50 MG PO TABS
50.0000 mg | ORAL_TABLET | Freq: Once | ORAL | Status: AC
  Administered 2013-01-12: 50 mg via ORAL
  Filled 2013-01-11: qty 1

## 2013-01-11 NOTE — ED Notes (Signed)
The pt denies any home conflict or fight.  Minimal responses  To questions asked

## 2013-01-11 NOTE — ED Notes (Signed)
PT. REPORTS PERSISTENT HEADACHE /DIZZINESS FOR SEVERAL DAYS , DENIES HEAD INJURY , ALERT AND ORIENTED , INTERMITTENT GENERALIZED TWITCHINGS AT TRIAGE.

## 2013-01-11 NOTE — ED Notes (Signed)
cbg was 110.

## 2013-01-11 NOTE — ED Notes (Signed)
The pt does not volunteer any information.  Hyperventilating c/o a headache cbg checked 110.  She saw her doctor  April 4th for the same complaints.  Hx of headaches. Vitals normal.  No complaints of nv

## 2013-01-12 LAB — PREGNANCY, URINE: Preg Test, Ur: NEGATIVE

## 2013-01-12 MED ORDER — TRAMADOL HCL 50 MG PO TABS: 50.0000 mg | ORAL_TABLET | Freq: Four times a day (QID) | ORAL | Status: DC | PRN

## 2013-01-12 MED ORDER — ALPRAZOLAM 0.25 MG PO TABS: 0.2500 mg | ORAL_TABLET | Freq: Three times a day (TID) | ORAL | Status: DC | PRN

## 2013-01-12 MED ORDER — METOCLOPRAMIDE HCL 10 MG PO TABS: 10.0000 mg | ORAL_TABLET | Freq: Four times a day (QID) | ORAL | Status: DC | PRN

## 2013-01-12 NOTE — ED Notes (Signed)
Pt was sleeping in room. She was woke up and told we needed a urine sample. Pt stated she was unable to go right now. Pt fell back asleep.

## 2013-01-12 NOTE — ED Notes (Signed)
Pt up to the br steady gait 

## 2013-01-12 NOTE — ED Notes (Signed)
The pt is still not talking

## 2013-01-12 NOTE — ED Provider Notes (Signed)
History     CSN: [id number]  Arrival date & time 01/11/13  2256   First MD Initiated Contact with Patient 01/11/13 2327      Chief Complaint  Patient presents with  . Headache  . Dizziness    (Consider location/radiation/quality/duration/timing/severity/associated sxs/prior treatment) HPI Pt p/w 1 week of HA to posterior head, nausea, lightheadedness and jerking motions. Pt states these started after a childhood friend died 1 week ago. Pt states she has had migraines in the past and this headache is identical. No focal weakness, numbness, visual changes. +dysphoria.  Past Medical History  Diagnosis Date  . Diabetes mellitus   . Asthma   . Bipolar 1 disorder     Past Surgical History  Procedure Laterality Date  . Cholecystectomy      No family history on file.  History  Substance Use Topics  . Smoking status: Current Every Day Smoker  . Smokeless tobacco: Not on file  . Alcohol Use: No    OB History   Grav Para Term Preterm Abortions TAB SAB Ect Mult Living                  Review of Systems  Constitutional: Negative for fever and chills.  HENT: Negative for neck pain and neck stiffness.   Eyes: Negative for visual disturbance.  Respiratory: Negative for shortness of breath.   Cardiovascular: Negative for chest pain.  Gastrointestinal: Positive for nausea. Negative for vomiting and abdominal pain.  Genitourinary: Negative for dysuria.  Musculoskeletal: Negative for back pain.  Skin: Negative for rash and wound.  Neurological: Positive for dizziness, light-headedness and headaches. Negative for weakness and numbness.  Psychiatric/Behavioral: Positive for dysphoric mood. Negative for suicidal ideas.  All other systems reviewed and are negative.    Allergies  Pineapple; Strawberry; and Divalproex sodium  Home Medications   Current Outpatient Rx  Name  Route  Sig  Dispense  Refill  . acetaminophen (TYLENOL) 500 MG tablet   Oral   Take 500 mg by mouth  every 6 (six) hours as needed for pain.         Marland Kitchen albuterol (PROVENTIL HFA;VENTOLIN HFA) 108 (90 BASE) MCG/ACT inhaler   Inhalation   Inhale 2 puffs into the lungs every 6 (six) hours as needed. Shortness of breath         . ALPRAZolam (XANAX) 0.25 MG tablet   Oral   Take 1 tablet (0.25 mg total) by mouth 3 (three) times daily as needed for anxiety.   15 tablet   0   . metoCLOPramide (REGLAN) 10 MG tablet   Oral   Take 1 tablet (10 mg total) by mouth every 6 (six) hours as needed (nausea/headache).   6 tablet   0   . traMADol (ULTRAM) 50 MG tablet   Oral   Take 1 tablet (50 mg total) by mouth every 6 (six) hours as needed for pain.   15 tablet   0     BP 114/47  Pulse 85  Temp(Src) 98.5 F (36.9 C) (Oral)  Resp 18  SpO2 100%  Physical Exam  Nursing note and vitals reviewed. Constitutional: She is oriented to person, place, and time. She appears well-developed and well-nourished. No distress.  HENT:  Head: Normocephalic and atraumatic.  Mouth/Throat: Oropharynx is clear and moist.  No sinus tenderness  Eyes: EOM are normal. Pupils are equal, round, and reactive to light.  Neck: Normal range of motion. Neck supple.  No meningismus   Cardiovascular: Normal  rate and regular rhythm.   Pulmonary/Chest: Effort normal and breath sounds normal. No respiratory distress. She has no wheezes. She has no rales.  Abdominal: Soft. Bowel sounds are normal. She exhibits no distension and no mass. There is no tenderness. There is no rebound and no guarding.  Musculoskeletal: Normal range of motion. She exhibits no edema and no tenderness.  Neurological: She is alert and oriented to person, place, and time.  5/ in all ext, sensation intact. Voluntary twitching movements of bl upper ext.   Skin: Skin is warm and dry. No rash noted. No erythema.  Psychiatric:  dysphoric mood. Crying when talking about death of friend    ED Course  Procedures (including critical care  time)  Labs Reviewed  GLUCOSE, CAPILLARY - Abnormal; Notable for the following:    Glucose-Capillary 110 (*)    All other components within normal limits  PREGNANCY, URINE   No results found.   1. Migraine   2. Adjustment disorder       MDM  Pt resting comfortably after meds. F/u with PMD.         Loren Racer, MD 01/12/13 4098

## 2013-01-12 NOTE — ED Notes (Signed)
Pt resting after med given

## 2013-01-14 ENCOUNTER — Encounter (HOSPITAL_COMMUNITY): Payer: Self-pay | Admitting: Emergency Medicine

## 2013-01-14 ENCOUNTER — Emergency Department (HOSPITAL_COMMUNITY): Payer: Medicare Other

## 2013-01-14 ENCOUNTER — Emergency Department (HOSPITAL_COMMUNITY)
Admission: EM | Admit: 2013-01-14 | Discharge: 2013-01-14 | Disposition: A | Discharge status: Home / Self Care | Payer: Medicare Other | Attending: Emergency Medicine | Admitting: Emergency Medicine

## 2013-01-14 DIAGNOSIS — M542 Cervicalgia: Secondary | ICD-10-CM | POA: Insufficient documentation

## 2013-01-14 DIAGNOSIS — H538 Other visual disturbances: Secondary | ICD-10-CM | POA: Insufficient documentation

## 2013-01-14 DIAGNOSIS — R51 Headache: Secondary | ICD-10-CM

## 2013-01-14 DIAGNOSIS — J45909 Unspecified asthma, uncomplicated: Secondary | ICD-10-CM | POA: Insufficient documentation

## 2013-01-14 DIAGNOSIS — F319 Bipolar disorder, unspecified: Secondary | ICD-10-CM | POA: Insufficient documentation

## 2013-01-14 DIAGNOSIS — N390 Urinary tract infection, site not specified: Secondary | ICD-10-CM

## 2013-01-14 DIAGNOSIS — Z79899 Other long term (current) drug therapy: Secondary | ICD-10-CM | POA: Insufficient documentation

## 2013-01-14 DIAGNOSIS — R5381 Other malaise: Secondary | ICD-10-CM | POA: Insufficient documentation

## 2013-01-14 DIAGNOSIS — M436 Torticollis: Secondary | ICD-10-CM | POA: Insufficient documentation

## 2013-01-14 DIAGNOSIS — Z8709 Personal history of other diseases of the respiratory system: Secondary | ICD-10-CM | POA: Insufficient documentation

## 2013-01-14 DIAGNOSIS — R209 Unspecified disturbances of skin sensation: Secondary | ICD-10-CM | POA: Insufficient documentation

## 2013-01-14 DIAGNOSIS — F17200 Nicotine dependence, unspecified, uncomplicated: Secondary | ICD-10-CM | POA: Insufficient documentation

## 2013-01-14 DIAGNOSIS — E119 Type 2 diabetes mellitus without complications: Secondary | ICD-10-CM | POA: Insufficient documentation

## 2013-01-14 LAB — CBC WITH DIFFERENTIAL/PLATELET
Basophils Absolute: 0.1 10*3/uL (ref 0.0–0.1)
Basophils Relative: 1 % (ref 0–1)
Eosinophils Absolute: 0.3 10*3/uL (ref 0.0–0.7)
Eosinophils Relative: 4 % (ref 0–5)
HCT: 40 % (ref 36.0–46.0)
Hemoglobin: 13.4 g/dL (ref 12.0–15.0)
Lymphocytes Relative: 42 % (ref 12–46)
Lymphs Abs: 3.7 10*3/uL (ref 0.7–4.0)
MCH: 29.1 pg (ref 26.0–34.0)
MCHC: 33.5 g/dL (ref 30.0–36.0)
MCV: 86.8 fL (ref 78.0–100.0)
Monocytes Absolute: 0.5 10*3/uL (ref 0.1–1.0)
Monocytes Relative: 5 % (ref 3–12)
Neutro Abs: 4.2 10*3/uL (ref 1.7–7.7)
Neutrophils Relative %: 48 % (ref 43–77)
Platelets: 268 10*3/uL (ref 150–400)
RBC: 4.61 MIL/uL (ref 3.87–5.11)
RDW: 13.8 % (ref 11.5–15.5)
WBC: 8.7 10*3/uL (ref 4.0–10.5)

## 2013-01-14 LAB — URINALYSIS, ROUTINE W REFLEX MICROSCOPIC
Bilirubin Urine: NEGATIVE
Glucose, UA: NEGATIVE mg/dL
Hgb urine dipstick: NEGATIVE
Ketones, ur: NEGATIVE mg/dL
Nitrite: NEGATIVE
Protein, ur: NEGATIVE mg/dL
Specific Gravity, Urine: 1.023 (ref 1.005–1.030)
Urobilinogen, UA: 1 mg/dL (ref 0.0–1.0)
pH: 6.5 (ref 5.0–8.0)

## 2013-01-14 LAB — POCT I-STAT, CHEM 8
BUN: 11 mg/dL (ref 6–23)
Calcium, Ion: 1.26 mmol/L — ABNORMAL HIGH (ref 1.12–1.23)
Chloride: 104 mEq/L (ref 96–112)
Creatinine, Ser: 0.9 mg/dL (ref 0.50–1.10)
Glucose, Bld: 96 mg/dL (ref 70–99)
HCT: 43 % (ref 36.0–46.0)
Hemoglobin: 14.6 g/dL (ref 12.0–15.0)
Potassium: 4.2 mEq/L (ref 3.5–5.1)
Sodium: 140 mEq/L (ref 135–145)
TCO2: 26 mmol/L (ref 0–100)

## 2013-01-14 LAB — URINE MICROSCOPIC-ADD ON

## 2013-01-14 MED ORDER — SULFAMETHOXAZOLE-TRIMETHOPRIM 800-160 MG PO TABS: 1.0000 | ORAL_TABLET | Freq: Two times a day (BID) | ORAL | Status: DC

## 2013-01-14 MED ORDER — KETOROLAC TROMETHAMINE 30 MG/ML IJ SOLN
30.0000 mg | Freq: Once | INTRAMUSCULAR | Status: AC
  Administered 2013-01-14: 30 mg via INTRAMUSCULAR
  Filled 2013-01-14: qty 1

## 2013-01-14 NOTE — ED Provider Notes (Signed)
Medical screening examination/treatment/procedure(s) were performed by non-physician practitioner and as supervising physician I was immediately available for consultation/collaboration.    Celene Kras, MD 01/14/13 [phone]

## 2013-01-14 NOTE — ED Notes (Signed)
Patient transported to CT 

## 2013-01-14 NOTE — ED Notes (Signed)
Discharge instructions, follow up and medications reviewed with pt. Pt verbalized understanding.

## 2013-01-14 NOTE — ED Notes (Signed)
PT. REPORTS " HEAD PRESSURE " FOR SEVERAL DAYS " SEEING DOTS AND GENERALIZED WEAKNESS .

## 2013-01-14 NOTE — ED Provider Notes (Signed)
History     CSN: [id number]  Arrival date & time 01/14/13  1951   First MD Initiated Contact with Patient 01/14/13 2114      Chief Complaint  Patient presents with  . Headache    (Consider location/radiation/quality/duration/timing/severity/associated sxs/prior treatment) HPI Comments: [name] is a morbidly obese, 27 year old female, who comes in reporting, that she has had head pressure, for the past several, days.  This is not a new issue for her.  She also is says that she has visual disturbances, generally weak.  Her arms and legs go weak.  Her arms and legs.  Occasionally will swell chest facial pressure.  She has been seen in this emergency room department for the same without a definitive diagnosis.  She was recently seen by her doctor, Dr. Greggory Stallion for supine to engage in medication.  For seasonal allergies, which he, said she's taken 3 doses, without any relief.  She states she recently lost a friend, and has been slightly depressed due to this and the only thing that makes her discomfort.  Better.  Is sleeping.  She finds it difficult to continue working and finish an entire shift  Patient is a 27 y.o. female presenting with headaches. The history is provided by the patient.  Headache Pain location:  Generalized Radiates to:  L neck Severity currently:  5/10 Duration:  5 days Timing:  Constant Progression:  Worsening Chronicity:  New Associated symptoms: neck pain, neck stiffness and numbness   Associated symptoms: no congestion, no fever, no nausea and no vomiting     Past Medical History  Diagnosis Date  . Diabetes mellitus   . Asthma   . Bipolar 1 disorder     Past Surgical History  Procedure Laterality Date  . Cholecystectomy      No family history on file.  History  Substance Use Topics  . Smoking status: Current Every Day Smoker  . Smokeless tobacco: Not on file  . Alcohol Use: No    OB History   Grav Para Term Preterm Abortions TAB SAB Ect Mult  Living                  Review of Systems  Constitutional: Negative for fever and appetite change.  HENT: Positive for neck pain and neck stiffness. Negative for congestion and rhinorrhea.   Gastrointestinal: Negative for nausea and vomiting.  Musculoskeletal: Negative for joint swelling.  Skin: Negative for rash.  Neurological: Positive for weakness, numbness and headaches.  All other systems reviewed and are negative.    Allergies  Pineapple; Strawberry; and Divalproex sodium  Home Medications   Current Outpatient Rx  Name  Route  Sig  Dispense  Refill  . acetaminophen (TYLENOL) 500 MG tablet   Oral   Take 500 mg by mouth every 6 (six) hours as needed for pain.         Marland Kitchen albuterol (PROVENTIL HFA;VENTOLIN HFA) 108 (90 BASE) MCG/ACT inhaler   Inhalation   Inhale 2 puffs into the lungs every 6 (six) hours as needed. Shortness of breath         . ALPRAZolam (XANAX) 0.25 MG tablet   Oral   Take 0.25 mg by mouth 3 (three) times daily as needed for anxiety.         Marland Kitchen aspirin-acetaminophen-caffeine (EXCEDRIN MIGRAINE) 250-250-65 MG per tablet   Oral   Take 1 tablet by mouth every 6 (six) hours as needed for pain.         Marland Kitchen  Chlorphen-PE-Acetaminophen (NOREL AD) 4-10-325 MG TABS   Oral   Take 1 tablet by mouth 2 (two) times daily as needed (for sinus pressure).         Marland Kitchen sulfamethoxazole-trimethoprim (SEPTRA DS) 800-160 MG per tablet   Oral   Take 1 tablet by mouth every 12 (twelve) hours.   10 tablet   0     BP 139/92  Pulse 83  Temp(Src) 97.5 F (36.4 C) (Oral)  Resp 14  SpO2 98%  Physical Exam  Nursing note and vitals reviewed. Constitutional: She appears well-developed and well-nourished.  HENT:  Head: Normocephalic.  Right Ear: External ear normal.  Left Ear: External ear normal.  Mouth/Throat: Oropharynx is clear and moist.  Eyes: Pupils are equal, round, and reactive to light.  Neck: Normal range of motion.  Cardiovascular: Normal rate.    Pulmonary/Chest: Effort normal.  Musculoskeletal: Normal range of motion.  Neurological: A cranial nerve deficit is present.  Skin: Skin is dry. No pallor.    ED Course  Procedures (including critical care time)  Labs Reviewed  URINALYSIS, ROUTINE W REFLEX MICROSCOPIC - Abnormal; Notable for the following:    APPearance CLOUDY (*)    Leukocytes, UA TRACE (*)    All other components within normal limits  URINE MICROSCOPIC-ADD ON - Abnormal; Notable for the following:    Squamous Epithelial / LPF MANY (*)    Bacteria, UA MANY (*)    All other components within normal limits  POCT I-STAT, CHEM 8 - Abnormal; Notable for the following:    Calcium, Ion 1.26 (*)    All other components within normal limits  URINE CULTURE  CBC WITH DIFFERENTIAL   Ct Head Wo Contrast  01/14/2013  *RADIOLOGY REPORT*  Clinical Data: Head pressure.  Blurred vision.  CT HEAD WITHOUT CONTRAST  Technique:  Contiguous axial images were obtained from the base of the skull through the vertex without contrast.  Comparison: CT head without contrast 03/19/2008.  Findings: No acute intracranial abnormality is present. Specifically, there is no evidence for acute infarct, hemorrhage, mass, hydrocephalus, or extra-axial fluid collection.  The paranasal sinuses and mastoid air cells are clear.  The globes and orbits are intact.  The osseous skull is intact.  IMPRESSION: Negative CT of the head.   Original Report Authenticated By: Marin Roberts, M.D.      1. Headache   2. UTI (lower urinary tract infection)       MDM   Obtain basic labs, urine, and a head CT, do, to patient and mother demand, although I doubt this will be contributory to patient's diagnosis for her presentation and affect.  I think this is mostly anxiety related Patient UA is contaminated but sue to symptoms will treat        Arman Filter, NP 01/14/13 2332  Arman Filter, NP 01/14/13 2332

## 2013-01-16 LAB — URINE CULTURE: Colony Count: >=100000

## 2013-01-28 ENCOUNTER — Ambulatory Visit (INDEPENDENT_AMBULATORY_CARE_PROVIDER_SITE_OTHER): Payer: Medicare Other | Admitting: Neurology

## 2013-01-28 ENCOUNTER — Encounter: Payer: Self-pay | Admitting: Neurology

## 2013-01-28 VITALS — BP 125/89 | HR 77 | Temp 98.7°F | Ht 64.0 in | Wt 257.0 lb

## 2013-01-28 DIAGNOSIS — R51 Headache: Secondary | ICD-10-CM

## 2013-01-28 DIAGNOSIS — G4733 Obstructive sleep apnea (adult) (pediatric): Secondary | ICD-10-CM | POA: Insufficient documentation

## 2013-01-28 DIAGNOSIS — R519 Headache, unspecified: Secondary | ICD-10-CM | POA: Insufficient documentation

## 2013-01-28 NOTE — Progress Notes (Signed)
Subjective:    Patient ID: [name] is a 27 y.o. female.  HPI  Huston Foley, MD, PhD [facility] [address] [address] [place], [place] [address]  Dear Dr. Julio Sicks,   I saw your patient, [name], upon your kind request in my neurologic clinic today for initial consultation of her headaches. The patient is unaccompanied today. As you know, [name] is a very pleasant 27 year old right-handed woman with an underlying medical history of sinusitis, vitamin D deficiency, allergic rhinitis, obesity, and asthma as well as a history of neuropathy has been experiencing recurrent headaches since the first of this month. She has been managed with ketoprofen and Fiorinal as needed. She describes her headaches as bifrontal, constant or throbbing and at times severe, up to 10/10. She has recently been to the ED twice, on 4/6 and 01/14/13 and had a CTH on 01/14/13, which was reported as negative. She has associated nausea but typically no vomiting and describes associated photophobia, and sonophobia. It helps to sleep in a darkened room. She has never been on a preventative medications and at this time she describes her headache frequency as every few days. She is taking excedrine migraine, which helps some. She goes to bed at 11 PM and has to get up at 6 AM. It takes her 30-45 minutes to fall asleep. She typically does not wake up rested and endorses daytime tiredness. She has had headaches first thing in the morning as well.  She is currently on gabapentin 300 mg q AM, lamictal 200 mg AM, Provera, ketoprofen, Fiorinal as needed, vitamin D and famotidine twice daily. She sees a Therapist, sports through [facility] near [place].  She denies associated neurological complications with her headache including one-sided weakness, numbness, tingling, slurring of speech, facial droop, impaired consciousness or swallowing difficulties, but she has had some lightheadedness  from time to time. She reports a family history of headaches. She is noted to snore and is not sure about witnessed apneas, she does endorse feeling tight in her airway and choking. She has a FHx of OSA in her mother who is on CPAP and she has an aunt with migraine HAs and aneurysms, s/p rupture and coiling.  Her Past Medical History Is Significant For: Past Medical History  Diagnosis Date  . Diabetes mellitus   . Asthma   . Bipolar 1 disorder     Her Past Surgical History Is Significant For: Past Surgical History  Procedure Laterality Date  . Cholecystectomy      Her Family History Is Significant For: No family history on file.  Her Social History Is Significant For: History   Social History  . Marital Status: Single    Spouse Name: N/A    Number of Children: N/A  . Years of Education: N/A   Social History Main Topics  . Smoking status: Current Every Day Smoker  . Smokeless tobacco: None  . Alcohol Use: No  . Drug Use: No  . Sexually Active: Yes    Birth Control/ Protection: IUD   Other Topics Concern  . None   Social History Narrative  . None    Her Allergies Are:  Allergies  Allergen Reactions  . Pineapple Swelling  . Strawberry Swelling  . Divalproex Sodium Hives and Rash  :   Her Current Medications Are:  Outpatient Encounter Prescriptions as of 01/28/2013  Medication Sig Dispense Refill  . acetaminophen (TYLENOL) 500 MG tablet Take 500 mg by mouth every  6 (six) hours as needed for pain.      Marland Kitchen albuterol (PROVENTIL HFA;VENTOLIN HFA) 108 (90 BASE) MCG/ACT inhaler Inhale 2 puffs into the lungs every 6 (six) hours as needed. Shortness of breath      . ALPRAZolam (XANAX) 0.25 MG tablet Take 0.25 mg by mouth 3 (three) times daily as needed for anxiety.      Marland Kitchen aspirin-acetaminophen-caffeine (EXCEDRIN MIGRAINE) 250-250-65 MG per tablet Take 1 tablet by mouth every 6 (six) hours as needed for pain.      . butalbital-acetaminophen-caffeine (FIORICET, ESGIC)  50-325-40 MG per tablet       . Chlorphen-PE-Acetaminophen (NOREL AD) 4-10-325 MG TABS Take 1 tablet by mouth 2 (two) times daily as needed (for sinus pressure).      . metoCLOPramide (REGLAN) 10 MG tablet       . traMADol (ULTRAM) 50 MG tablet       . sulfamethoxazole-trimethoprim (SEPTRA DS) 800-160 MG per tablet Take 1 tablet by mouth every 12 (twelve) hours.  10 tablet  0   No facility-administered encounter medications on file as of 01/28/2013.  :  Review of Systems  Constitutional: Positive for fatigue.  HENT: Positive for trouble swallowing.   Respiratory: Positive for cough and shortness of breath.   Cardiovascular: Positive for leg swelling.  Genitourinary: Positive for difficulty urinating.  Skin:       itching  Allergic/Immunologic: Positive for environmental allergies.  Neurological: Positive for dizziness and headaches.       Memory loss  Psychiatric/Behavioral: Positive for sleep disturbance (not enough sleep) and dysphoric mood. The patient is nervous/anxious.     Objective:  Neurologic Exam  Physical Exam Physical Examination:   Filed Vitals:   01/28/13 1402  BP: 125/89  Pulse: 77  Temp: 98.7 F (37.1 C)    General Examination: The patient is a very pleasant 27 y.o. female in no acute distress. She is obese.  HEENT: Normocephalic, atraumatic, pupils are equal, round and reactive to light and accommodation. Funduscopic exam is normal with sharp disc margins noted. Extraocular tracking is good without nystagmus noted. Normal smooth pursuit is noted. Hearing is grossly intact. Tympanic membranes are clear bilaterally. Face is symmetric with normal facial animation and normal facial sensation. Speech is clear with no dysarthria noted. There is no hypophonia. There is no lip, neck or jaw tremor. Neck is supple with full range of motion. There are no carotid bruits on auscultation. Oropharynx exam reveals normal findings. Moderate airway crowding is noted d/t larger  tongue, larger uvula and tonsils of 2+. Mallampati is class II. Neck size is 16 inches. Tongue protrudes centrally and palate elevates symmetrically.    Chest: is clear to auscultation without wheezing, rhonchi or crackles noted.  Heart: sounds are regular and normal without murmurs, rubs or gallops noted.   Abdomen: is soft, non-tender and non-distended with normal bowel sounds appreciated on auscultation.  Extremities: There is no pitting edema in the distal lower extremities bilaterally.   Skin: is warm and dry with no trophic changes noted.  Musculoskeletal: exam reveals no obvious joint deformities, tenderness or joint swelling or erythema.  Neurologically:  Mental status: The patient is awake, alert and oriented in all 4 spheres. Her memory, attention, language and knowledge are appropriate. There is no aphasia, agnosia, apraxia or anomia. Speech is clear with normal prosody and enunciation. Thought process is linear. Mood is congruent and affect is normal.  Cranial nerves are as described above under HEENT exam. In addition, shoulder  shrug is normal with equal shoulder height noted. Motor exam: Normal bulk, strength and tone is noted. There is no drift, tremor or rebound. Romberg is negative. Reflexes are 2+ throughout. Toes are downgoing bilaterally. Fine motor skills are intact with normal finger taps, normal hand movements, normal rapid alternating patting, normal foot taps and normal foot agility.  Cerebellar testing shows no dysmetria or intention tremor on finger to nose testing. Heel to shin is unremarkable bilaterally. There is no truncal or gait ataxia.  Sensory exam is intact to light touch, pinprick, vibration, temperature sense in the upper and lower extremities.  Gait, station and balance are unremarkable. No veering to one side is noted. No leaning to one side is noted. Posture is age-appropriate and stance is narrow based. No problems turning are noted. She turns en bloc.  Tandem walk is unremarkable. Intact toe and heel stance is noted.               Assessment and Plan:   Assessment and Plan:  In summary, [name] is a very pleasant 27 y.o.-year old female with a history of new onset headaches, which have some migrainous characteristics. Her exam is fairly nonfocal with the exception of a moderately tight looking airway, enlarged next size and obesity. I have concern that she has underlying OSA which may be contributing to her headache. She describes snoring, nonrestorative sleep, morning headaches, and choking sensations while asleep. I talked to her about preventative medications for headaches and suggested at this time the following: I would like to proceed with a sleep study first and if she has obstructive sleep apnea she would benefit from treatment. I would also like to proceed with a brain MRI and brain MRA because these headaches are fairly new in onset and have been ongoing for the past month. She also has a family history of brain aneurysm. She also has a family history of obstructive sleep apnea. After these tests I would like to pick up our discussion about preventative medications unless we have to change her management by for example treating her for sleep apnea first. She's agreeable with the plan and I will see her back after she's had these tests. She's encouraged to call with any interim questions, concerns, problems, updates or test results.   Thank you very much for allowing me to participate in the care of this nice patient. If I can be of any further assistance to you please do not hesitate to call me at [phone].  Sincerely,   Huston Foley, MD, PhD

## 2013-01-28 NOTE — Patient Instructions (Addendum)
I think overall you are doing fairly well but I do want to suggest a few things today:  Based on your symptoms and your exam I believe you are at risk for obstructive sleep apnea or OSA, and I think we should proceed with a sleep study to determine whether you do or do not have OSA and how severe it is. If you have more than mild OSA, I want you to consider treatment with CPAP. Please remember, the risks and ramifications of moderate to severe obstructive sleep apnea or OSA are: Cardiovascular disease, including congestive heart failure, stroke, difficult to control hypertension, arrhythmias, and even type 2 diabetes has been linked to untreated OSA. Sleep apnea causes disruption of sleep and sleep deprivation in most cases, which, in turn, can cause recurrent headaches, problems with memory, mood, concentration, focus, and vigilance. Most people with untreated sleep apnea report excessive daytime sleepiness, which can affect their ability to drive. Please do not drive if you feel sleepy.   Remember to drink plenty of fluid, eat healthy meals and do not skip any meals. Try to eat protein with a every meal and eat a healthy snack such as fruit or nuts in between meals. Try to keep a regular sleep-wake schedule and try to exercise daily, particularly in the form of walking, 20-30 minutes a day, if you can.   Please remember, common headache triggers are: sleep deprivation, dehydration, overheating, stress, hypoglycemia or skipping meals, excessive pain medications or excessive alcohol use. Some people have food triggers such as aged cheese, orange juice or chocolate, especially dark chocolate. Try to avoid these headache triggers as much possible. It may be helpful to keep a headache diary to figure out what makes your headaches worse or brings them on. Some people report headache onset after exercise but studies have shown that regular exercise may actually prevent headaches from coming on. If you have  exercise-induced headaches, please make sure that you drink plenty of fluid before and after exercising and that you don't over do it.  As far as your medications are concerned, I would like to suggest no new medicines at this time but we will pick up her discussions once you've had your sleep study in your brain scans.  I would like to see you back after your tests. Please call us with any interim questions, concerns, problems, updates or refill requests.  Please also call us for any test results so we can go over those with you on the phone. Brett Canales is my clinical assistant and will answer any of your questions and relay your messages to me and also relay most of my messages to you.  Our phone number is [phone]. We also have an after hours call service for urgent matters and there is a physician on-call for urgent questions. For any emergencies you know to call 911 or go to the nearest emergency room.

## 2013-02-04 ENCOUNTER — Ambulatory Visit (INDEPENDENT_AMBULATORY_CARE_PROVIDER_SITE_OTHER): Payer: Medicare Other

## 2013-02-04 DIAGNOSIS — G4710 Hypersomnia, unspecified: Secondary | ICD-10-CM

## 2013-02-04 DIAGNOSIS — R0989 Other specified symptoms and signs involving the circulatory and respiratory systems: Secondary | ICD-10-CM

## 2013-02-04 DIAGNOSIS — R51 Headache: Secondary | ICD-10-CM

## 2013-02-04 DIAGNOSIS — G4733 Obstructive sleep apnea (adult) (pediatric): Secondary | ICD-10-CM

## 2013-02-04 DIAGNOSIS — R0609 Other forms of dyspnea: Secondary | ICD-10-CM

## 2013-02-06 ENCOUNTER — Ambulatory Visit
Admission: RE | Admit: 2013-02-06 | Discharge: 2013-02-06 | Disposition: A | Discharge status: Home / Self Care | Payer: Medicare Other | Source: Ambulatory Visit | Attending: Neurology | Admitting: Neurology

## 2013-02-06 DIAGNOSIS — R51 Headache: Secondary | ICD-10-CM

## 2013-02-06 DIAGNOSIS — G4733 Obstructive sleep apnea (adult) (pediatric): Secondary | ICD-10-CM

## 2013-02-10 ENCOUNTER — Telehealth: Payer: Self-pay | Admitting: *Deleted

## 2013-02-10 NOTE — Telephone Encounter (Signed)
Left a message on the pt's home voice mail  regarding her recent MRI & MRA results.  Contact information was given so that she may call call back.

## 2013-02-10 NOTE — Telephone Encounter (Signed)
Message copied by Avie Echevaria on Tue Feb 10, 2013  9:16 AM ------      Message from: Huston Foley      Created: Tue Feb 10, 2013  8:42 AM       Please call and advise the patient that the recent scans we did were within normal limits. We did a brain MRI and MRA, which is a brain blood vessel test and both showed normal findings. 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 No further action is required at this time. Please remind patient to keep any upcoming appointments and to call us with any interim questions, concerns, problems or updates. Thanks,       Huston Foley, MD, PhD             ------

## 2013-02-10 NOTE — Progress Notes (Signed)
Quick Note:  Please call and advise the patient that the recent scans we did were within normal limits. We did a brain MRI and MRA, which is a brain blood vessel test and both showed normal findings. 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 No further action is required at this time. Please remind patient to keep any upcoming appointments and to call us with any interim questions, concerns, problems or updates. Thanks,  Huston Foley, MD, PhD   ______

## 2013-02-11 NOTE — Telephone Encounter (Signed)
Spoke with patient and relayed the results of their MRI and MRA as well as Dr Teofilo Pod advise or recommendations.  There are no future appointments at this time.  The patient understood and had no questions.

## 2013-02-11 NOTE — Progress Notes (Signed)
Quick Note:  Spoke with patient and relayed the results of their MRI and MRA as well as Dr Teofilo Pod advise or recommendations. There are no currently no future appointments scheduled. The patient understood and had no questions.  ______

## 2013-02-13 ENCOUNTER — Telehealth: Payer: Self-pay | Admitting: Neurology

## 2013-02-13 NOTE — Telephone Encounter (Signed)
Please call and notify the patient that the recent sleep study did not show any significant obstructive sleep apnea. Please inform patient that I would like to go over the details of the study during a follow up appointment and if not already arranged please arrange a followup appointment in approx. 8 weeks. Thanks, Huston Foley, MD, PhD [facility])

## 2013-03-02 ENCOUNTER — Emergency Department (INDEPENDENT_AMBULATORY_CARE_PROVIDER_SITE_OTHER)
Admission: EM | Admit: 2013-03-02 | Discharge: 2013-03-02 | Disposition: A | Discharge status: Home / Self Care | Payer: Medicare Other | Source: Home / Self Care

## 2013-03-02 ENCOUNTER — Encounter (HOSPITAL_COMMUNITY): Payer: Self-pay

## 2013-03-02 DIAGNOSIS — G8929 Other chronic pain: Secondary | ICD-10-CM

## 2013-03-02 DIAGNOSIS — Z041 Encounter for examination and observation following transport accident: Secondary | ICD-10-CM

## 2013-03-02 DIAGNOSIS — M65839 Other synovitis and tenosynovitis, unspecified forearm: Secondary | ICD-10-CM

## 2013-03-02 DIAGNOSIS — R51 Headache: Secondary | ICD-10-CM

## 2013-03-02 DIAGNOSIS — M778 Other enthesopathies, not elsewhere classified: Secondary | ICD-10-CM
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
## 2013-03-04 NOTE — ED Provider Notes (Signed)
Medical screening examination/treatment/procedure(s) were performed by resident physician or non-physician practitioner and as supervising physician I was immediately available for consultation/collaboration.   Uriah Philipson DOUGLAS MD.   Misheel Gowans D Ar[place]a Freshour, MD 03/04/13 1440 

## 2013-06-09 ENCOUNTER — Other Ambulatory Visit (HOSPITAL_COMMUNITY): Payer: Self-pay | Admitting: Psychiatry

## 2013-06-15 ENCOUNTER — Encounter: Payer: Self-pay | Admitting: Obstetrics

## 2013-07-02 ENCOUNTER — Emergency Department (HOSPITAL_COMMUNITY)
Admission: EM | Admit: 2013-07-02 | Discharge: 2013-07-02 | Disposition: A | Discharge status: Home / Self Care | Payer: Medicare Other | Attending: Emergency Medicine | Admitting: Emergency Medicine

## 2013-07-02 ENCOUNTER — Encounter (HOSPITAL_COMMUNITY): Payer: Self-pay | Admitting: Emergency Medicine

## 2013-07-02 DIAGNOSIS — M545 Low back pain, unspecified: Secondary | ICD-10-CM | POA: Insufficient documentation

## 2013-07-02 DIAGNOSIS — E119 Type 2 diabetes mellitus without complications: Secondary | ICD-10-CM | POA: Insufficient documentation

## 2013-07-02 DIAGNOSIS — Z8669 Personal history of other diseases of the nervous system and sense organs: Secondary | ICD-10-CM | POA: Insufficient documentation

## 2013-07-02 DIAGNOSIS — F319 Bipolar disorder, unspecified: Secondary | ICD-10-CM | POA: Insufficient documentation

## 2013-07-02 DIAGNOSIS — F17200 Nicotine dependence, unspecified, uncomplicated: Secondary | ICD-10-CM | POA: Insufficient documentation

## 2013-07-02 DIAGNOSIS — Z79899 Other long term (current) drug therapy: Secondary | ICD-10-CM | POA: Insufficient documentation

## 2013-07-02 DIAGNOSIS — J45901 Unspecified asthma with (acute) exacerbation: Secondary | ICD-10-CM | POA: Insufficient documentation

## 2013-07-02 DIAGNOSIS — J45909 Unspecified asthma, uncomplicated: Secondary | ICD-10-CM

## 2013-07-02 MED ORDER — FLUTICASONE-SALMETEROL 250-50 MCG/DOSE IN AEPB: 1.0000 | INHALATION_SPRAY | RESPIRATORY_TRACT | Status: DC

## 2013-07-02 MED ORDER — IPRATROPIUM BROMIDE 0.02 % IN SOLN
0.5000 mg | Freq: Once | RESPIRATORY_TRACT | Status: AC
  Administered 2013-07-02: 0.5 mg via RESPIRATORY_TRACT
  Filled 2013-07-02: qty 2.5

## 2013-07-02 MED ORDER — ALBUTEROL SULFATE (5 MG/ML) 0.5% IN NEBU
2.5000 mg | INHALATION_SOLUTION | Freq: Once | RESPIRATORY_TRACT | Status: AC
  Administered 2013-07-02: 2.5 mg via RESPIRATORY_TRACT
  Filled 2013-07-02: qty 0.5

## 2013-07-02 NOTE — ED Provider Notes (Signed)
Medical screening examination/treatment/procedure(s) were performed by non-physician practitioner and as supervising physician I was immediately available for consultation/collaboration.  Geoffery Lyons, MD 07/02/13 [phone]

## 2013-07-02 NOTE — ED Notes (Signed)
Pt states she has had some chest congestion and cold symptoms lately and today when she got off work she developed some shortness of breath  Pt states she has asthma  Pt she used her inhaler without relief

## 2013-07-02 NOTE — ED Provider Notes (Signed)
This chart was scribed for Arman Filter NP, a non-physician practitioner working with Geoffery Lyons, MD by Lewanda Rife, ED Scribe. This patient was seen in room WTR5/WTR5 and the patient's care was started at 2036.    CSN: [id number]     Arrival date & time 07/02/13  1911 History   First MD Initiated Contact with Patient 07/02/13 2026     Chief Complaint  Patient presents with  . Shortness of Breath   (Consider location/radiation/quality/duration/timing/severity/associated sxs/prior Treatment) The history is provided by the patient.   HPI Comments: [name] is a 27 y.o. female who presents to the Emergency Department with a PMHx of asthma complaining of improving moderate difficulty breathing onset today while at work. Reports she uses her inhaler everyday. Reports constant mild low back pain. Denies associated fever. Denies any aggravating factors. Reports difficulty breathing is moderately improved with inhaler. Reports she smokes cigarettes. Her primary care physician.  Has recommended that she increase her asthma treatment to include, Advair.  She's been resistant to this advancement, but now realizes that she probably should start a more medically intense regime.  I recommended that she followup with her primary care physician to start this Past Medical History  Diagnosis Date  . Diabetes mellitus   . Asthma   . Bipolar 1 disorder   . Headache(784.0) 01/28/2013  . OSA (obstructive sleep apnea) 01/28/2013   History reviewed. No pertinent past surgical history. Family History  Problem Relation Age of Onset  . CAD Other    History  Substance Use Topics  . Smoking status: Current Every Day Smoker    Types: Cigarettes  . Smokeless tobacco: Not on file  . Alcohol Use: No   OB History   Grav Para Term Preterm Abortions TAB SAB Ect Mult Living                 Review of Systems  Respiratory: Positive for shortness of breath. Negative for cough and wheezing.    Cardiovascular: Negative for chest pain.  Musculoskeletal: Positive for back pain.   A complete 10 system review of systems was obtained and all systems are negative except as noted in the HPI and PMH.    Allergies  Pineapple; Strawberry; and Divalproex sodium  Home Medications   Current Outpatient Rx  Name  Route  Sig  Dispense  Refill  . albuterol (PROVENTIL HFA;VENTOLIN HFA) 108 (90 BASE) MCG/ACT inhaler   Inhalation   Inhale 2 puffs into the lungs every 6 (six) hours as needed. Shortness of breath         . ARIPiprazole (ABILIFY) 5 MG tablet   Oral   Take 2.5 mg by mouth daily.         . butalbital-acetaminophen-caffeine (FIORICET, ESGIC) 50-325-40 MG per tablet   Oral   Take 1 tablet by mouth every 6 (six) hours as needed for pain.          Marland Kitchen lamoTRIgine (LAMICTAL) 100 MG tablet   Oral   Take 100 mg by mouth daily.         Marland Kitchen acetaminophen (TYLENOL) 500 MG tablet   Oral   Take 500 mg by mouth every 6 (six) hours as needed for pain.         Marland Kitchen ALPRAZolam (XANAX) 0.25 MG tablet   Oral   Take 0.25 mg by mouth 3 (three) times daily as needed for anxiety.         Marland Kitchen aspirin-acetaminophen-caffeine (EXCEDRIN MIGRAINE) 250-250-65 MG  per tablet   Oral   Take 1 tablet by mouth every 6 (six) hours as needed for pain.         Marland Kitchen Fluticasone-Salmeterol (ADVAIR DISKUS) 250-50 MCG/DOSE AEPB   Inhalation   Inhale 1 puff into the lungs 1 day or 1 dose.   60 each   1    BP 144/90  Pulse 103  Temp(Src) 98.3 F (36.8 C) (Oral)  Resp 16  Ht 5\' 5"  (1.651 m)  Wt 260 lb (117.935 kg)  BMI 43.27 kg/m2  SpO2 100%  LMP 06/01/2013 Physical Exam  Nursing note and vitals reviewed. Constitutional: She is oriented to person, place, and time. She appears well-developed and well-nourished. No distress.  HENT:  Head: Normocephalic and atraumatic.  Eyes: EOM are normal. Pupils are equal, round, and reactive to light.  Neck: Neck supple. No tracheal deviation present.   Cardiovascular: Normal rate and regular rhythm.   Pulmonary/Chest: Effort normal and breath sounds normal. No stridor. No respiratory distress. She has no wheezes. She has no rales.  Decreased air movement on the right   Musculoskeletal: Normal range of motion.  Neurological: She is alert and oriented to person, place, and time.  Skin: Skin is warm and dry.  Psychiatric: She has a normal mood and affect. Her behavior is normal.    ED Course  Procedures (including critical care time) Medications  albuterol (PROVENTIL) (5 MG/ML) 0.5% nebulizer solution 2.5 mg (2.5 mg Nebulization Given 07/02/13 2052)  ipratropium (ATROVENT) nebulizer solution 0.5 mg (0.5 mg Nebulization Given 07/02/13 2052)    Labs Review Labs Reviewed - No data to display Imaging Review No results found. After albuterol and Atrovent, nebulizer patient is feeling much better. MDM   1. Asthma     Patient responded nicely to albuterol, Atrovent, and do not feel that she needs steroids at this point.  I will prescribe an Advair inhaler for her and she will make an appointment with her primary care physician for followup evaluation   I personally performed the services described in this documentation, which was scribed in my presence. The recorded information has been reviewed and is accurate.   Arman Filter, NP 07/02/13 2136

## 2013-07-04 ENCOUNTER — Emergency Department (HOSPITAL_COMMUNITY): Payer: Medicare Other

## 2013-07-04 ENCOUNTER — Encounter (HOSPITAL_COMMUNITY): Payer: Self-pay | Admitting: Emergency Medicine

## 2013-07-04 ENCOUNTER — Emergency Department (HOSPITAL_COMMUNITY)
Admission: EM | Admit: 2013-07-04 | Discharge: 2013-07-04 | Disposition: A | Discharge status: Home / Self Care | Payer: Medicare Other | Attending: Emergency Medicine | Admitting: Emergency Medicine

## 2013-07-04 DIAGNOSIS — J45901 Unspecified asthma with (acute) exacerbation: Secondary | ICD-10-CM

## 2013-07-04 DIAGNOSIS — F319 Bipolar disorder, unspecified: Secondary | ICD-10-CM | POA: Insufficient documentation

## 2013-07-04 DIAGNOSIS — F17200 Nicotine dependence, unspecified, uncomplicated: Secondary | ICD-10-CM | POA: Insufficient documentation

## 2013-07-04 DIAGNOSIS — E119 Type 2 diabetes mellitus without complications: Secondary | ICD-10-CM | POA: Insufficient documentation

## 2013-07-04 DIAGNOSIS — R0789 Other chest pain: Secondary | ICD-10-CM | POA: Insufficient documentation

## 2013-07-04 DIAGNOSIS — Z8669 Personal history of other diseases of the nervous system and sense organs: Secondary | ICD-10-CM | POA: Insufficient documentation

## 2013-07-04 DIAGNOSIS — Z79899 Other long term (current) drug therapy: Secondary | ICD-10-CM | POA: Insufficient documentation

## 2013-07-04 LAB — BASIC METABOLIC PANEL
BUN: 12 mg/dL (ref 6–23)
CO2: 22 mEq/L (ref 19–32)
Calcium: 9.2 mg/dL (ref 8.4–10.5)
Chloride: 101 mEq/L (ref 96–112)
Creatinine, Ser: 0.71 mg/dL (ref 0.50–1.10)
GFR calc Af Amer: >90 mL/min (ref >90)
GFR calc non Af Amer: >90 mL/min (ref >90)
Glucose, Bld: 96 mg/dL (ref 70–99)
Potassium: 3.2 mEq/L — ABNORMAL LOW (ref 3.5–5.1)
Sodium: 136 mEq/L (ref 135–145)

## 2013-07-04 LAB — CBC WITH DIFFERENTIAL/PLATELET
Basophils Absolute: 0.1 10*3/uL (ref 0.0–0.1)
Basophils Relative: 1 % (ref 0–1)
Eosinophils Absolute: 0.4 10*3/uL (ref 0.0–0.7)
Eosinophils Relative: 4 % (ref 0–5)
HCT: 37.6 % (ref 36.0–46.0)
Hemoglobin: 12.8 g/dL (ref 12.0–15.0)
Lymphocytes Relative: 51 % — ABNORMAL HIGH (ref 12–46)
Lymphs Abs: 5 10*3/uL — ABNORMAL HIGH (ref 0.7–4.0)
MCH: 29.4 pg (ref 26.0–34.0)
MCHC: 34 g/dL (ref 30.0–36.0)
MCV: 86.4 fL (ref 78.0–100.0)
Monocytes Absolute: 0.5 10*3/uL (ref 0.1–1.0)
Monocytes Relative: 5 % (ref 3–12)
Neutro Abs: 3.8 10*3/uL (ref 1.7–7.7)
Neutrophils Relative %: 40 % — ABNORMAL LOW (ref 43–77)
Platelets: 259 10*3/uL (ref 150–400)
RBC: 4.35 MIL/uL (ref 3.87–5.11)
RDW: 14.2 % (ref 11.5–15.5)
WBC: 9.7 10*3/uL (ref 4.0–10.5)

## 2013-07-04 MED ORDER — ALBUTEROL (5 MG/ML) CONTINUOUS INHALATION SOLN
INHALATION_SOLUTION | RESPIRATORY_TRACT | Status: AC
  Administered 2013-07-04: 20 mg via RESPIRATORY_TRACT
  Filled 2013-07-04: qty 20

## 2013-07-04 MED ORDER — POTASSIUM CHLORIDE CRYS ER 20 MEQ PO TBCR
30.0000 meq | EXTENDED_RELEASE_TABLET | Freq: Once | ORAL | Status: AC
  Administered 2013-07-04: 30 meq via ORAL

## 2013-07-04 MED ORDER — POTASSIUM CHLORIDE CRYS ER 20 MEQ PO TBCR
40.0000 meq | EXTENDED_RELEASE_TABLET | Freq: Once | ORAL | Status: DC
  Filled 2013-07-04: qty 2

## 2013-07-04 MED ORDER — PREDNISONE 10 MG PO TABS: 40.0000 mg | ORAL_TABLET | Freq: Every day | ORAL | Status: DC

## 2013-07-04 MED ORDER — ALBUTEROL (5 MG/ML) CONTINUOUS INHALATION SOLN: 20.0000 mg/h | INHALATION_SOLUTION | RESPIRATORY_TRACT | Status: DC

## 2013-07-04 MED ORDER — ALBUTEROL SULFATE HFA 108 (90 BASE) MCG/ACT IN AERS
2.0000 | INHALATION_SPRAY | RESPIRATORY_TRACT | Status: DC | PRN
  Administered 2013-07-04: 2 via RESPIRATORY_TRACT
  Filled 2013-07-04: qty 6.7

## 2013-07-04 MED ORDER — ALBUTEROL SULFATE HFA 108 (90 BASE) MCG/ACT IN AERS: 1.0000 | INHALATION_SPRAY | Freq: Four times a day (QID) | RESPIRATORY_TRACT | Status: DC | PRN

## 2013-07-04 MED ORDER — METHYLPREDNISOLONE SODIUM SUCC 125 MG IJ SOLR
125.0000 mg | Freq: Once | INTRAMUSCULAR | Status: AC
  Administered 2013-07-04: 125 mg via INTRAVENOUS
  Filled 2013-07-04: qty 2

## 2013-07-04 MED ORDER — POTASSIUM CHLORIDE CRYS ER 20 MEQ PO TBCR: 30.0000 meq | EXTENDED_RELEASE_TABLET | Freq: Once | ORAL | Status: DC

## 2013-07-04 MED ORDER — ALBUTEROL SULFATE (5 MG/ML) 0.5% IN NEBU
5.0000 mg | INHALATION_SOLUTION | Freq: Once | RESPIRATORY_TRACT | Status: AC
  Administered 2013-07-04: 5 mg via RESPIRATORY_TRACT
  Filled 2013-07-04: qty 1

## 2013-07-04 NOTE — ED Notes (Signed)
Pt. reports persistent SOB  with wheezing and occasional dry cough seen at [facility] 2 days ago prescribed with inhaler did fill prescription .

## 2013-07-04 NOTE — ED Notes (Signed)
o2 100 to 99 when walking

## 2013-07-04 NOTE — ED Provider Notes (Signed)
Medical screening examination/treatment/procedure(s) were performed by non-physician practitioner and as supervising physician I was immediately available for consultation/collaboration.   Gwyneth Sprout, MD 07/04/13 2314

## 2013-07-04 NOTE — ED Provider Notes (Signed)
CSN: [id number]     Arrival date & time 07/04/13  1951 History   First MD Initiated Contact with Patient 07/04/13 2011     Chief Complaint  Patient presents with  . Asthma   (Consider location/radiation/quality/duration/timing/severity/associated sxs/prior Treatment) HPI  Patient presents to the emergency department with complaints of shortness of breath and exacerbation of asthma. She was seen 2 days ago at [facility] for the same given an albuterol inhaler and a prescription for the same. She states that she was unable to find her inhaler in her home therefore with unable to use it. She admits to having a dry cough recently and in endorses that her wheezing has been getting progressively worse since her last visit. He was not given steroids at her previous visit and was unable to fill her Advair because it's expensive. Patient denies ever being admitted for her asthma she has never been intubated before. She feels as though his never been this severe and is unsure as to what has triggered this episode. He shouldn't only able to say a few words at a time. Respiratory therapy is here to start continuous nebulizer treatment.  Past Medical History  Diagnosis Date  . Diabetes mellitus   . Asthma   . Bipolar 1 disorder   . Headache(784.0) 01/28/2013  . OSA (obstructive sleep apnea) 01/28/2013   History reviewed. No pertinent past surgical history. Family History  Problem Relation Age of Onset  . CAD Other    History  Substance Use Topics  . Smoking status: Current Every Day Smoker    Types: Cigarettes  . Smokeless tobacco: Not on file  . Alcohol Use: No   OB History   Grav Para Term Preterm Abortions TAB SAB Ect Mult Living                 Review of Systems  Respiratory: Positive for chest tightness and shortness of breath.   All other systems reviewed and are negative.    Allergies  Pineapple; Strawberry; Divalproex sodium; and Haldol  Home Medications   Current Outpatient  Rx  Name  Route  Sig  Dispense  Refill  . acetaminophen (TYLENOL) 500 MG tablet   Oral   Take 500 mg by mouth every 6 (six) hours as needed for pain.         Marland Kitchen albuterol (PROVENTIL HFA;VENTOLIN HFA) 108 (90 BASE) MCG/ACT inhaler   Inhalation   Inhale 2 puffs into the lungs every 6 (six) hours as needed. Shortness of breath         . ARIPiprazole (ABILIFY) 5 MG tablet   Oral   Take 2.5 mg by mouth daily.         . butalbital-acetaminophen-caffeine (FIORICET, ESGIC) 50-325-40 MG per tablet   Oral   Take 1 tablet by mouth every 6 (six) hours as needed for pain.          Marland Kitchen lamoTRIgine (LAMICTAL) 100 MG tablet   Oral   Take 100 mg by mouth daily.         Marland Kitchen albuterol (PROVENTIL HFA;VENTOLIN HFA) 108 (90 BASE) MCG/ACT inhaler   Inhalation   Inhale 1-2 puffs into the lungs every 6 (six) hours as needed for wheezing.   1 Inhaler   2   . predniSONE (DELTASONE) 10 MG tablet   Oral   Take 4 tablets (40 mg total) by mouth daily.   20 tablet   0    BP 114/81  Pulse 112  Temp(Src)  98 F (36.7 C) (Oral)  Resp 16  SpO2 96%  LMP 07/04/2013 Physical Exam  Nursing note and vitals reviewed. Constitutional: She appears well-developed and well-nourished. She appears distressed.  HENT:  Head: Normocephalic and atraumatic.  Eyes: Pupils are equal, round, and reactive to light.  Neck: Normal range of motion. Neck supple.  Cardiovascular: Normal rate and regular rhythm.   Pulmonary/Chest: Effort normal. She has decreased breath sounds (diffuse). She has wheezes.  + increased respiratory effort  Abdominal: Soft.  Neurological: She is alert.  Skin: Skin is warm and dry.    ED Course  Procedures (including critical care time) Labs Review Labs Reviewed  CBC WITH DIFFERENTIAL - Abnormal; Notable for the following:    Neutrophils Relative % 40 (*)    Lymphocytes Relative 51 (*)    Lymphs Abs 5.0 (*)    All other components within normal limits  BASIC METABOLIC PANEL -  Abnormal; Notable for the following:    Potassium 3.2 (*)    All other components within normal limits   Imaging Review Dg Chest 2 View  07/04/2013   *RADIOLOGY REPORT*  Clinical Data: Asthma exacerbation  CHEST - 2 VIEW  Comparison: 08/08/2011  Findings: The heart, mediastinum and hila are within normal limits.  The lungs are clear.  No pleural effusion or pneumothorax.  The bony thorax is unremarkable.  IMPRESSION: Normal chest radiographs.   Original Report Authenticated By: Amie Portland, M.D.    MDM   1. Asthma exacerbation    Patient received continuous nebulizer treatment in the ED as well as 125mg  IV solumedrol. Patient monitored for a few hours in the ED and doing much better. Work rate of breathing greatly increased. She ambulated without difficulty or SOB, her oxygen saturation stayed in the upper 90's.   Pt says she feels comfortably going home and feels much better. Will give Rx for prednisone dose pack, given another albuterol inhaler in the ED and a prescription with refill.   27 y.o.[name]'s evaluation in the Emergency Department is complete. It has been determined that no acute conditions requiring further emergency intervention are present at this time. The patient/guardian have been advised of the diagnosis and plan. We have discussed signs and symptoms that warrant return to the ED, such as changes or worsening in symptoms.  Vital signs are stable at discharge. Filed Vitals:   07/04/13 2256  BP: 155/62  Pulse: 110  Temp:   Resp:     Patient/guardian has voiced understanding and agreed to follow-up with the PCP or specialist.      Dorthula Matas, PA-C 07/04/13 2300

## 2013-08-03 ENCOUNTER — Ambulatory Visit: Payer: Medicare Other | Admitting: Obstetrics

## 2013-08-07 ENCOUNTER — Emergency Department (HOSPITAL_COMMUNITY)
Admission: EM | Admit: 2013-08-07 | Discharge: 2013-08-07 | Disposition: A | Discharge status: Home / Self Care | Payer: Medicare Other | Attending: Emergency Medicine | Admitting: Emergency Medicine

## 2013-08-07 ENCOUNTER — Encounter (HOSPITAL_COMMUNITY): Payer: Self-pay | Admitting: Emergency Medicine

## 2013-08-07 ENCOUNTER — Emergency Department (HOSPITAL_COMMUNITY): Payer: Medicare Other

## 2013-08-07 DIAGNOSIS — Z8669 Personal history of other diseases of the nervous system and sense organs: Secondary | ICD-10-CM | POA: Insufficient documentation

## 2013-08-07 DIAGNOSIS — F17200 Nicotine dependence, unspecified, uncomplicated: Secondary | ICD-10-CM | POA: Insufficient documentation

## 2013-08-07 DIAGNOSIS — F319 Bipolar disorder, unspecified: Secondary | ICD-10-CM | POA: Insufficient documentation

## 2013-08-07 DIAGNOSIS — Z79899 Other long term (current) drug therapy: Secondary | ICD-10-CM | POA: Insufficient documentation

## 2013-08-07 DIAGNOSIS — J45901 Unspecified asthma with (acute) exacerbation: Secondary | ICD-10-CM

## 2013-08-07 DIAGNOSIS — E119 Type 2 diabetes mellitus without complications: Secondary | ICD-10-CM | POA: Insufficient documentation

## 2013-08-07 MED ORDER — ALBUTEROL SULFATE (5 MG/ML) 0.5% IN NEBU
5.0000 mg | INHALATION_SOLUTION | Freq: Once | RESPIRATORY_TRACT | Status: AC
  Administered 2013-08-07: 5 mg via RESPIRATORY_TRACT
  Filled 2013-08-07: qty 1

## 2013-08-07 MED ORDER — DEXAMETHASONE SODIUM PHOSPHATE 10 MG/ML IJ SOLN
10.0000 mg | Freq: Once | INTRAMUSCULAR | Status: AC
  Administered 2013-08-07: 10 mg via INTRAMUSCULAR
  Filled 2013-08-07: qty 1

## 2013-08-07 MED ORDER — ALBUTEROL SULFATE (5 MG/ML) 0.5% IN NEBU
INHALATION_SOLUTION | RESPIRATORY_TRACT | Status: AC
  Filled 2013-08-07: qty 1

## 2013-08-07 MED ORDER — ALBUTEROL SULFATE HFA 108 (90 BASE) MCG/ACT IN AERS: 2.0000 | INHALATION_SPRAY | RESPIRATORY_TRACT | Status: DC | PRN

## 2013-08-07 MED ORDER — ALBUTEROL SULFATE (5 MG/ML) 0.5% IN NEBU
5.0000 mg | INHALATION_SOLUTION | Freq: Once | RESPIRATORY_TRACT | Status: AC
  Administered 2013-08-07: 5 mg via RESPIRATORY_TRACT

## 2013-08-07 MED ORDER — IPRATROPIUM BROMIDE 0.02 % IN SOLN
0.5000 mg | Freq: Once | RESPIRATORY_TRACT | Status: AC
  Administered 2013-08-07: 0.5 mg via RESPIRATORY_TRACT
  Filled 2013-08-07: qty 2.5

## 2013-08-07 NOTE — ED Provider Notes (Signed)
Medical screening examination/treatment/procedure(s) were performed by non-physician practitioner and as supervising physician I was immediately available for consultation/collaboration.    Disaya Walt M Jazarah Capili, MD 08/07/13 0659 

## 2013-08-07 NOTE — ED Provider Notes (Signed)
CSN: [id number]     Arrival date & time 08/07/13  0114 History   First MD Initiated Contact with Patient 08/07/13 0140     Chief Complaint  Patient presents with  . Asthma   (Consider location/radiation/quality/duration/timing/severity/associated sxs/prior Treatment) HPI Comments: Patient is a 27 year old female past medical history significant for asthma, DM, bipolar 1 disorder presented to emergency department for 1 month of progressively worsening shortness of breath and wheezing. Patient states that over the last 3 days she has noticed increased wheezing and difficulty breathing. Patient states she has used her inhaler 3 times a day for the last 3 days without improvement with her symptoms. She denies any aggravating factors. She denies any fevers, cough, congestion, rhinorrhea, ear pain. PERC negative.  Patient is a 27 y.o. female presenting with asthma.  Asthma Pertinent negatives include no chest pain, coughing, fever or headaches.    Past Medical History  Diagnosis Date  . Diabetes mellitus   . Asthma   . Bipolar 1 disorder   . Headache(784.0) 01/28/2013  . OSA (obstructive sleep apnea) 01/28/2013   History reviewed. No pertinent past surgical history. Family History  Problem Relation Age of Onset  . CAD Other    History  Substance Use Topics  . Smoking status: Current Every Day Smoker    Types: Cigarettes  . Smokeless tobacco: Not on file  . Alcohol Use: No   OB History   Grav Para Term Preterm Abortions TAB SAB Ect Mult Living                 Review of Systems  Constitutional: Negative for fever.  Respiratory: Positive for chest tightness, shortness of breath and wheezing. Negative for cough.   Cardiovascular: Negative for chest pain.  Neurological: Negative for headaches.  All other systems reviewed and are negative.    Allergies  Pineapple; Strawberry; Divalproex sodium; and Haldol  Home Medications   Current Outpatient Rx  Name  Route  Sig  Dispense   Refill  . albuterol (PROVENTIL HFA;VENTOLIN HFA) 108 (90 BASE) MCG/ACT inhaler   Inhalation   Inhale 1-2 puffs into the lungs every 6 (six) hours as needed for wheezing.   1 Inhaler   2   . ARIPiprazole (ABILIFY) 5 MG tablet   Oral   Take 2.5 mg by mouth daily.         . butalbital-acetaminophen-caffeine (FIORICET, ESGIC) 50-325-40 MG per tablet   Oral   Take 1 tablet by mouth every 6 (six) hours as needed for pain.          Marland Kitchen lamoTRIgine (LAMICTAL) 100 MG tablet   Oral   Take 100 mg by mouth daily.         Marland Kitchen acetaminophen (TYLENOL) 500 MG tablet   Oral   Take 500 mg by mouth every 6 (six) hours as needed for pain.         Marland Kitchen albuterol (PROVENTIL HFA;VENTOLIN HFA) 108 (90 BASE) MCG/ACT inhaler   Inhalation   Inhale 2 puffs into the lungs every 4 (four) hours as needed for wheezing or shortness of breath. Shortness of breath   1 Inhaler   0    BP 107/52  Pulse 92  Temp(Src) 98.3 F (36.8 C) (Axillary)  Resp 16  SpO2 97%  LMP 08/03/2013 Physical Exam  Constitutional: She is oriented to person, place, and time. She appears well-developed and well-nourished. No distress.  Talking in complete sentences w/o difficulty  HENT:  Head: Normocephalic and atraumatic.  Right Ear: External ear normal.  Left Ear: External ear normal.  Nose: Nose normal.  Mouth/Throat: Oropharynx is clear and moist. No oropharyngeal exudate.  Eyes: Conjunctivae are normal.  Neck: Normal range of motion. Neck supple.  Cardiovascular: Normal rate, regular rhythm, normal heart sounds and intact distal pulses.   Pulmonary/Chest: Effort normal. No stridor. No respiratory distress. She has wheezes. She has no rales. She exhibits no tenderness.  Abdominal: Soft. Bowel sounds are normal. There is no tenderness.  Musculoskeletal: Normal range of motion. She exhibits no edema.  Lymphadenopathy:    She has no cervical adenopathy.  Neurological: She is alert and oriented to person, place, and time.   Skin: Skin is warm and dry. She is not diaphoretic.    ED Course  Procedures (including critical care time) Medications  albuterol (PROVENTIL) (5 MG/ML) 0.5% nebulizer solution 5 mg (5 mg Nebulization Given 08/07/13 0122)  dexamethasone (DECADRON) injection 10 mg (10 mg Intramuscular Given 08/07/13 0252)  ipratropium (ATROVENT) nebulizer solution 0.5 mg (0.5 mg Nebulization Given 08/07/13 0313)  albuterol (PROVENTIL) (5 MG/ML) 0.5% nebulizer solution 5 mg (5 mg Nebulization Given 08/07/13 0313)    Labs Review Labs Reviewed - No data to display Imaging Review Dg Chest 2 View  08/07/2013   CLINICAL DATA:  Shortness of breath, wheezing, asthma attack.  EXAM: CHEST  2 VIEW  COMPARISON:  Prior radiograph from 07/04/2013  FINDINGS: Cardiac and mediastinal silhouettes are stable in size and contour, and remain within normal limits.  Lungs are normally inflated. No focal infiltrate to suggest acute infectious pneumonitis identified. No pleural effusion or pulmonary edema. No pneumothorax.  Osseous structures are within normal limits.  IMPRESSION: No radiographic evidence of active cardiopulmonary disease.   Electronically Signed   By: Rise Mu M.D.   On: 08/07/2013 03:19    EKG Interpretation   None       MDM   1. Asthma exacerbation     Afebrile, NAD, non-toxic appearing, AAOx4. Patient ambulated in ED with O2 saturations maintained >90, no current signs of respiratory distress. Lung exam improved after nebulizer treatment. IM Decadron given. Chest x-ray negative. Pt states they are breathing at baseline. Pt has been instructed to continue using prescribed medications and to speak with PCP about today's exacerbation. Patient is agreeable to plan. Patient is stable at time of discharge Patient d/w with Dr. Norlene Campbell, agrees with plan.         Jeannetta Ellis, PA-C 08/07/13 [phone]

## 2013-08-07 NOTE — ED Notes (Signed)
Pt. reports progressing SOB with wheezing for several days unrelieved by MDI at home . Denies fever / no cough .

## 2013-08-09 ENCOUNTER — Encounter (HOSPITAL_COMMUNITY): Payer: Self-pay | Admitting: Emergency Medicine

## 2013-08-09 ENCOUNTER — Emergency Department (HOSPITAL_COMMUNITY)
Admission: EM | Admit: 2013-08-09 | Discharge: 2013-08-09 | Disposition: A | Discharge status: Home / Self Care | Payer: Medicare Other | Attending: Emergency Medicine | Admitting: Emergency Medicine

## 2013-08-09 DIAGNOSIS — R062 Wheezing: Secondary | ICD-10-CM

## 2013-08-09 DIAGNOSIS — E119 Type 2 diabetes mellitus without complications: Secondary | ICD-10-CM | POA: Insufficient documentation

## 2013-08-09 DIAGNOSIS — Z79899 Other long term (current) drug therapy: Secondary | ICD-10-CM | POA: Insufficient documentation

## 2013-08-09 DIAGNOSIS — F319 Bipolar disorder, unspecified: Secondary | ICD-10-CM | POA: Insufficient documentation

## 2013-08-09 DIAGNOSIS — Z888 Allergy status to other drugs, medicaments and biological substances status: Secondary | ICD-10-CM | POA: Insufficient documentation

## 2013-08-09 DIAGNOSIS — Z8669 Personal history of other diseases of the nervous system and sense organs: Secondary | ICD-10-CM | POA: Insufficient documentation

## 2013-08-09 DIAGNOSIS — R0602 Shortness of breath: Secondary | ICD-10-CM | POA: Insufficient documentation

## 2013-08-09 DIAGNOSIS — G4733 Obstructive sleep apnea (adult) (pediatric): Secondary | ICD-10-CM | POA: Insufficient documentation

## 2013-08-09 DIAGNOSIS — J45909 Unspecified asthma, uncomplicated: Secondary | ICD-10-CM

## 2013-08-09 DIAGNOSIS — F17200 Nicotine dependence, unspecified, uncomplicated: Secondary | ICD-10-CM | POA: Insufficient documentation

## 2013-08-09 DIAGNOSIS — J45901 Unspecified asthma with (acute) exacerbation: Secondary | ICD-10-CM | POA: Insufficient documentation

## 2013-08-09 MED ORDER — ALBUTEROL SULFATE (5 MG/ML) 0.5% IN NEBU
5.0000 mg | INHALATION_SOLUTION | Freq: Once | RESPIRATORY_TRACT | Status: AC
  Administered 2013-08-09: 5 mg via RESPIRATORY_TRACT
  Filled 2013-08-09: qty 1

## 2013-08-09 MED ORDER — PREDNISONE 20 MG PO TABS
60.0000 mg | ORAL_TABLET | Freq: Once | ORAL | Status: AC
  Administered 2013-08-09: 60 mg via ORAL
  Filled 2013-08-09: qty 3

## 2013-08-09 MED ORDER — IPRATROPIUM BROMIDE 0.02 % IN SOLN
0.5000 mg | Freq: Once | RESPIRATORY_TRACT | Status: AC
  Administered 2013-08-09: 0.5 mg via RESPIRATORY_TRACT
  Filled 2013-08-09: qty 2.5

## 2013-08-09 MED ORDER — ALBUTEROL SULFATE HFA 108 (90 BASE) MCG/ACT IN AERS: 2.0000 | INHALATION_SPRAY | RESPIRATORY_TRACT | Status: DC | PRN

## 2013-08-09 MED ORDER — PREDNISONE 20 MG PO TABS: 60.0000 mg | ORAL_TABLET | Freq: Every day | ORAL | Status: DC

## 2013-08-09 NOTE — ED Notes (Signed)
To ED via [facility]- with c/o asthma attack-- was at a store and started having an asthma attack-- used inhaler x 6 puffs-- without relief. EMS picked pt up at home, in car. Stated people in car were smoking making it worse.

## 2013-08-09 NOTE — ED Notes (Signed)
resp easier-- lungs clear-- pt happy to go home.

## 2013-08-09 NOTE — ED Provider Notes (Signed)
CSN: [id number]     Arrival date & time 08/09/13  1357 History   First MD Initiated Contact with Patient 08/09/13 1403     Chief Complaint  Patient presents with  . Asthma   (Consider location/radiation/quality/duration/timing/severity/associated sxs/prior Treatment) Patient is a 27 y.o. female presenting with asthma. The history is provided by the patient.  Asthma Pertinent negatives include no chest pain, no abdominal pain and no headaches.  pt c/o wheezing, hx asthma.  Waxes and wanes. Worse w smoke exposure. States uses mdi almost every day. +smoker, but says she is quitting.  ems noted pt in car w people smoking which made asthma worse. Pt used mdi several x today. States last pred use was a few weeks ago.  Denies prior admit/icu stay related to asthma. No cough or sore throat. No fever or chills. No cp or discomfort. No leg pain or swelling.   Past Medical History  Diagnosis Date  . Diabetes mellitus   . Asthma   . Bipolar 1 disorder   . Headache(784.0) 01/28/2013  . OSA (obstructive sleep apnea) 01/28/2013   History reviewed. No pertinent past surgical history. Family History  Problem Relation Age of Onset  . CAD Other    History  Substance Use Topics  . Smoking status: Current Every Day Smoker    Types: Cigarettes  . Smokeless tobacco: Not on file  . Alcohol Use: No   OB History   Grav Para Term Preterm Abortions TAB SAB Ect Mult Living                 Review of Systems  Constitutional: Negative for fever and chills.  HENT: Negative for sore throat.   Eyes: Negative for redness.  Respiratory: Positive for wheezing.   Cardiovascular: Negative for chest pain and leg swelling.  Gastrointestinal: Negative for abdominal pain.  Genitourinary: Negative for flank pain.  Musculoskeletal: Negative for back pain and neck pain.  Skin: Negative for rash.  Neurological: Negative for headaches.  Hematological: Does not bruise/bleed easily.  Psychiatric/Behavioral: Negative  for confusion.    Allergies  Pineapple; Strawberry; Divalproex sodium; and Haldol  Home Medications   Current Outpatient Rx  Name  Route  Sig  Dispense  Refill  . acetaminophen (TYLENOL) 500 MG tablet   Oral   Take 500 mg by mouth every 6 (six) hours as needed for pain.         Marland Kitchen albuterol (PROVENTIL HFA;VENTOLIN HFA) 108 (90 BASE) MCG/ACT inhaler   Inhalation   Inhale 1-2 puffs into the lungs every 6 (six) hours as needed for wheezing.   1 Inhaler   2   . albuterol (PROVENTIL HFA;VENTOLIN HFA) 108 (90 BASE) MCG/ACT inhaler   Inhalation   Inhale 2 puffs into the lungs every 4 (four) hours as needed for wheezing or shortness of breath. Shortness of breath   1 Inhaler   0   . ARIPiprazole (ABILIFY) 5 MG tablet   Oral   Take 2.5 mg by mouth daily.         . butalbital-acetaminophen-caffeine (FIORICET, ESGIC) 50-325-40 MG per tablet   Oral   Take 1 tablet by mouth every 6 (six) hours as needed for pain.          Marland Kitchen lamoTRIgine (LAMICTAL) 100 MG tablet   Oral   Take 100 mg by mouth daily.          BP 134/82  Pulse 74  Temp(Src) 97.7 F (36.5 C) (Oral)  Resp 18  Wt 260 lb (117.935 kg)  SpO2 100%  LMP 08/03/2013 Physical Exam  Nursing note and vitals reviewed. Constitutional: She appears well-developed and well-nourished. No distress.  HENT:  Nose: Nose normal.  Mouth/Throat: Oropharynx is clear and moist.  Eyes: Conjunctivae are normal. No scleral icterus.  Neck: Neck supple. No JVD present. No tracheal deviation present.  Cardiovascular: Normal rate, regular rhythm, normal heart sounds and intact distal pulses.  Exam reveals no gallop and no friction rub.   No murmur heard. Pulmonary/Chest: Effort normal. She has wheezes.  Abdominal: Soft. Normal appearance. She exhibits no distension. There is no tenderness.  Musculoskeletal: She exhibits no edema and no tenderness.  Neurological: She is alert.  Skin: Skin is warm and dry. No rash noted. She is not  diaphoretic.  Psychiatric: She has a normal mood and affect.    ED Course  Procedures (including critical care time)  EKG Interpretation   None      Dg Chest 2 View  08/07/2013   CLINICAL DATA:  Shortness of breath, wheezing, asthma attack.  EXAM: CHEST  2 VIEW  COMPARISON:  Prior radiograph from 07/04/2013  FINDINGS: Cardiac and mediastinal silhouettes are stable in size and contour, and remain within normal limits.  Lungs are normally inflated. No focal infiltrate to suggest acute infectious pneumonitis identified. No pleural effusion or pulmonary edema. No pneumothorax.  Osseous structures are within normal limits.  IMPRESSION: No radiographic evidence of active cardiopulmonary disease.   Electronically Signed   By: Rise Mu M.D.   On: 08/07/2013 03:19      MDM  Albuterol and atrovent neb.   pred po.  Reviewed nursing notes and prior charts for additional history.   Recheck no wheezing. Breathing comfortably. Appears stable for d/c.      Suzi Roots, MD 08/13/13 1534

## 2014-02-06 ENCOUNTER — Emergency Department (HOSPITAL_COMMUNITY)
Admission: EM | Admit: 2014-02-06 | Discharge: 2014-02-06 | Disposition: A | Discharge status: Home / Self Care | Payer: Medicare HMO | Attending: Emergency Medicine | Admitting: Emergency Medicine

## 2014-02-06 ENCOUNTER — Encounter (HOSPITAL_COMMUNITY): Payer: Self-pay | Admitting: Emergency Medicine

## 2014-02-06 DIAGNOSIS — R42 Dizziness and giddiness: Secondary | ICD-10-CM | POA: Insufficient documentation

## 2014-02-06 DIAGNOSIS — K921 Melena: Secondary | ICD-10-CM | POA: Insufficient documentation

## 2014-02-06 DIAGNOSIS — Z79899 Other long term (current) drug therapy: Secondary | ICD-10-CM | POA: Insufficient documentation

## 2014-02-06 DIAGNOSIS — R109 Unspecified abdominal pain: Secondary | ICD-10-CM | POA: Insufficient documentation

## 2014-02-06 DIAGNOSIS — Z3202 Encounter for pregnancy test, result negative: Secondary | ICD-10-CM | POA: Insufficient documentation

## 2014-02-06 DIAGNOSIS — Z8669 Personal history of other diseases of the nervous system and sense organs: Secondary | ICD-10-CM | POA: Insufficient documentation

## 2014-02-06 DIAGNOSIS — J45909 Unspecified asthma, uncomplicated: Secondary | ICD-10-CM | POA: Insufficient documentation

## 2014-02-06 DIAGNOSIS — M549 Dorsalgia, unspecified: Secondary | ICD-10-CM | POA: Insufficient documentation

## 2014-02-06 DIAGNOSIS — K625 Hemorrhage of anus and rectum: Secondary | ICD-10-CM

## 2014-02-06 DIAGNOSIS — F319 Bipolar disorder, unspecified: Secondary | ICD-10-CM | POA: Insufficient documentation

## 2014-02-06 DIAGNOSIS — IMO0002 Reserved for concepts with insufficient information to code with codable children: Secondary | ICD-10-CM | POA: Insufficient documentation

## 2014-02-06 DIAGNOSIS — E119 Type 2 diabetes mellitus without complications: Secondary | ICD-10-CM | POA: Insufficient documentation

## 2014-02-06 DIAGNOSIS — Z87891 Personal history of nicotine dependence: Secondary | ICD-10-CM | POA: Insufficient documentation

## 2014-02-06 LAB — CBC WITH DIFFERENTIAL/PLATELET
Basophils Absolute: 0 10*3/uL (ref 0.0–0.1)
Basophils Relative: 1 % (ref 0–1)
Eosinophils Absolute: 0.3 10*3/uL (ref 0.0–0.7)
Eosinophils Relative: 4 % (ref 0–5)
HCT: 39.3 % (ref 36.0–46.0)
Hemoglobin: 13.1 g/dL (ref 12.0–15.0)
Lymphocytes Relative: 39 % (ref 12–46)
Lymphs Abs: 2.9 10*3/uL (ref 0.7–4.0)
MCH: 29.7 pg (ref 26.0–34.0)
MCHC: 33.3 g/dL (ref 30.0–36.0)
MCV: 89.1 fL (ref 78.0–100.0)
Monocytes Absolute: 0.5 10*3/uL (ref 0.1–1.0)
Monocytes Relative: 6 % (ref 3–12)
Neutro Abs: 3.8 10*3/uL (ref 1.7–7.7)
Neutrophils Relative %: 50 % (ref 43–77)
Platelets: 328 10*3/uL (ref 150–400)
RBC: 4.41 MIL/uL (ref 3.87–5.11)
RDW: 14 % (ref 11.5–15.5)
WBC: 7.4 10*3/uL (ref 4.0–10.5)

## 2014-02-06 LAB — COMPREHENSIVE METABOLIC PANEL
ALT: 23 U/L (ref 0–35)
AST: 19 U/L (ref 0–37)
Albumin: 3.8 g/dL (ref 3.5–5.2)
Alkaline Phosphatase: 68 U/L (ref 39–117)
BUN: 11 mg/dL (ref 6–23)
CO2: 26 mEq/L (ref 19–32)
Calcium: 9.3 mg/dL (ref 8.4–10.5)
Chloride: 103 mEq/L (ref 96–112)
Creatinine, Ser: 0.85 mg/dL (ref 0.50–1.10)
GFR calc Af Amer: >90 mL/min (ref >90)
GFR calc non Af Amer: >90 mL/min (ref >90)
Glucose, Bld: 94 mg/dL (ref 70–99)
Potassium: 3.7 mEq/L (ref 3.7–5.3)
Sodium: 142 mEq/L (ref 137–147)
Total Bilirubin: 0.4 mg/dL (ref 0.3–1.2)
Total Protein: 7.3 g/dL (ref 6.0–8.3)

## 2014-02-06 LAB — PREGNANCY, URINE: Preg Test, Ur: NEGATIVE

## 2014-02-06 NOTE — ED Provider Notes (Signed)
CSN: [id number]     Arrival date & time 02/06/14  9924 History   First MD Initiated Contact with Patient 02/06/14 [phone]     Chief Complaint  Patient presents with  . Rectal Bleeding     (Consider location/radiation/quality/duration/timing/severity/associated sxs/prior Treatment) Patient is a 28 y.o. female presenting with hematochezia. The history is provided by the patient.  Rectal Bleeding Quality:  Bright red Associated symptoms: abdominal pain   Associated symptoms: no vomiting    patient had rectal bleeding twice. Once last night and once this morning. States that there was no stool, just blood. She's mild lower abdominal pain and mild back cramping. She states she felt lightheaded last night but not today. No other bleeding. She has not had episodes like this before. No dysuria. No fevers. No cough.  Past Medical History  Diagnosis Date  . Diabetes mellitus   . Asthma   . Bipolar 1 disorder   . Headache(784.0) 01/28/2013  . OSA (obstructive sleep apnea) 01/28/2013   History reviewed. No pertinent past surgical history. Family History  Problem Relation Age of Onset  . CAD Other    History  Substance Use Topics  . Smoking status: Former Smoker    Types: Cigarettes  . Smokeless tobacco: Never Used  . Alcohol Use: No   OB History   Grav Para Term Preterm Abortions TAB SAB Ect Mult Living                 Review of Systems  Constitutional: Negative for activity change and appetite change.  Eyes: Negative for pain.  Respiratory: Negative for chest tightness and shortness of breath.   Cardiovascular: Negative for chest pain and leg swelling.  Gastrointestinal: Positive for abdominal pain, hematochezia and anal bleeding. Negative for nausea, vomiting, diarrhea and rectal pain.  Genitourinary: Negative for flank pain.  Musculoskeletal: Positive for back pain. Negative for neck stiffness.  Skin: Negative for rash.  Neurological: Negative for weakness, numbness and headaches.   Hematological: Does not bruise/bleed easily.  Psychiatric/Behavioral: Negative for behavioral problems.      Allergies  Pineapple; Strawberry; Divalproex sodium; and Haldol  Home Medications   Prior to Admission medications   Medication Sig Start Date End Date Taking? Authorizing Provider  acetaminophen (TYLENOL) 500 MG tablet Take 500 mg by mouth every 6 (six) hours as needed for pain.    Historical Provider, MD  albuterol (PROVENTIL HFA;VENTOLIN HFA) 108 (90 BASE) MCG/ACT inhaler Inhale 1-2 puffs into the lungs every 6 (six) hours as needed for wheezing. 07/04/13   Linus Mako, PA-C  albuterol (PROVENTIL HFA;VENTOLIN HFA) 108 (90 BASE) MCG/ACT inhaler Inhale 2 puffs into the lungs every 4 (four) hours as needed for wheezing or shortness of breath. Shortness of breath 08/07/13   Jennifer L Piepenbrink, PA-C  albuterol (PROVENTIL HFA;VENTOLIN HFA) 108 (90 BASE) MCG/ACT inhaler Inhale 2 puffs into the lungs every 4 (four) hours as needed for wheezing. 08/09/13   Mirna Mires, MD  ARIPiprazole (ABILIFY) 5 MG tablet Take 2.5 mg by mouth daily.    Historical Provider, MD  butalbital-acetaminophen-caffeine (FIORICET, ESGIC) 50-325-40 MG per tablet Take 1 tablet by mouth every 6 (six) hours as needed for pain.  01/15/13   Historical Provider, MD  lamoTRIgine (LAMICTAL) 100 MG tablet Take 100 mg by mouth daily.    Historical Provider, MD  predniSONE (DELTASONE) 20 MG tablet Take 3 tablets (60 mg total) by mouth daily. 08/09/13   Mirna Mires, MD   BP 131/75  Pulse 64  Temp(Src) 98.1 F (36.7 C) (Oral)  Resp 18  Ht 5\' 5"  (1.651 m)  Wt 255 lb (115.667 kg)  BMI 42.43 kg/m2  SpO2 98%  LMP 01/21/2014 Physical Exam  Nursing note and vitals reviewed. Constitutional: She is oriented to person, place, and time. She appears well-developed and well-nourished.  HENT:  Head: Normocephalic and atraumatic.  Eyes: EOM are normal. Pupils are equal, round, and reactive to light.  Neck: Normal  range of motion. Neck supple.  Cardiovascular: Normal rate, regular rhythm and normal heart sounds.   No murmur heard. Pulmonary/Chest: Effort normal and breath sounds normal. No respiratory distress. She has no wheezes. She has no rales.  Abdominal: Soft. Bowel sounds are normal. She exhibits no distension. There is tenderness. There is no rebound and no guarding.  Mild lower abdominal tenderness without rebound or guarding.  Musculoskeletal: Normal range of motion.  Neurological: She is alert and oriented to person, place, and time. No cranial nerve deficit.  Skin: Skin is warm and dry.  Psychiatric: She has a normal mood and affect. Her speech is normal.    ED Course  Procedures (including critical care time) Labs Review Labs Reviewed  CBC WITH DIFFERENTIAL  COMPREHENSIVE METABOLIC PANEL  PREGNANCY, URINE  POC OCCULT BLOOD, ED    Imaging Review No results found.   EKG Interpretation None      MDM   Final diagnoses:  Rectal bleeding    Patient with blood coming out of her rectum. Lab work is reassuring. She is guaiac positive. Vitals are reassuring. Will followup with gastroenterology. She appears stable for followup from the ER.    Jasper Riling. Alvino Chapel, MD 02/06/14 [phone]

## 2014-02-06 NOTE — Discharge Instructions (Signed)
Gastrointestinal Bleeding °Gastrointestinal (GI) bleeding means there is bleeding somewhere along the digestive tract, between the mouth and anus. °CAUSES  °There are many different problems that can cause GI bleeding. Possible causes include: °· Esophagitis. This is inflammation, irritation, or swelling of the esophagus. °· Hemorrhoids. These are veins that are full of blood (engorged) in the rectum. They cause pain, inflammation, and may bleed. °· Anal fissures. These are areas of painful tearing which may bleed. They are often caused by passing hard stool. °· Diverticulosis. These are pouches that form on the colon over time, with age, and may bleed significantly. °· Diverticulitis. This is inflammation in areas with diverticulosis. It can cause pain, fever, and bloody stools, although bleeding is rare. °· Polyps and cancer. Colon cancer often starts out as precancerous polyps. °· Gastritis and ulcers. Bleeding from the upper gastrointestinal tract (near the stomach) may travel through the intestines and produce black, sometimes tarry, often bad smelling stools. In certain cases, if the bleeding is fast enough, the stools may not be black, but red. This condition may be life-threatening. °SYMPTOMS  °· Vomiting bright red blood or material that looks like coffee grounds. °· Bloody, black, or tarry stools. °DIAGNOSIS  °Your caregiver may diagnose your condition by taking your history and performing a physical exam. More tests may be needed, including: °· X-rays and other imaging tests. °· Esophagogastroduodenoscopy (EGD). This test uses a flexible, lighted tube to look at your esophagus, stomach, and small intestine. °· Colonoscopy. This test uses a flexible, lighted tube to look at your colon. °TREATMENT  °Treatment depends on the cause of your bleeding.  °· For bleeding from the esophagus, stomach, small intestine, or colon, the caregiver doing your EGD or colonoscopy may be able to stop the bleeding as part of  the procedure. °· Inflammation or infection of the colon can be treated with medicines. °· Many rectal problems can be treated with creams, suppositories, or warm baths. °· Surgery is sometimes needed. °· Blood transfusions are sometimes needed if you have lost a lot of blood. °If bleeding is slow, you may be allowed to go home. If there is a lot of bleeding, you will need to stay in the hospital for observation. °HOME CARE INSTRUCTIONS  °· Take any medicines exactly as prescribed. °· Keep your stools soft by eating foods that are high in fiber. These foods include whole grains, legumes, fruits, and vegetables. Prunes (1 to 3 a day) work well for many people. °· Drink enough fluids to keep your urine clear or pale yellow. °SEEK IMMEDIATE MEDICAL CARE IF:  °· Your bleeding increases. °· You feel lightheaded, weak, or you faint. °· You have severe cramps in your back or abdomen. °· You pass large blood clots in your stool. °· Your problems are getting worse. °MAKE SURE YOU:  °· Understand these instructions. °· Will watch your condition. °· Will get help right away if you are not doing well or get worse. °Document Released: 09/21/2000 Document Revised: 09/10/2012 Document Reviewed: 09/03/2011 °[employer]® Patient Information ©2014 [employer]. ° °

## 2014-02-06 NOTE — ED Notes (Signed)
Pt reports 2 episodes of rectal bleeding that started on Friday. Pt. Reports seeing blood on tissue after cleaning and in toilet . Pt reports no previous HX of same.

## 2014-02-06 NOTE — ED Notes (Signed)
Hemoccult card reads positive. Dr.Pickering aware. ED-Lab.

## 2014-02-08 LAB — POC OCCULT BLOOD, ED: Fecal Occult Bld: POSITIVE — AB

## 2014-02-10 ENCOUNTER — Encounter: Payer: Self-pay | Admitting: Internal Medicine

## 2014-03-27 ENCOUNTER — Emergency Department (HOSPITAL_COMMUNITY)
Admission: EM | Admit: 2014-03-27 | Discharge: 2014-03-27 | Disposition: A | Discharge status: Home / Self Care | Payer: Medicare HMO | Attending: Emergency Medicine | Admitting: Emergency Medicine

## 2014-03-27 ENCOUNTER — Encounter (HOSPITAL_COMMUNITY): Payer: Self-pay | Admitting: Emergency Medicine

## 2014-03-27 DIAGNOSIS — J45909 Unspecified asthma, uncomplicated: Secondary | ICD-10-CM | POA: Insufficient documentation

## 2014-03-27 DIAGNOSIS — Z8669 Personal history of other diseases of the nervous system and sense organs: Secondary | ICD-10-CM | POA: Insufficient documentation

## 2014-03-27 DIAGNOSIS — G43919 Migraine, unspecified, intractable, without status migrainosus: Secondary | ICD-10-CM | POA: Insufficient documentation

## 2014-03-27 DIAGNOSIS — Z87891 Personal history of nicotine dependence: Secondary | ICD-10-CM | POA: Insufficient documentation

## 2014-03-27 DIAGNOSIS — R062 Wheezing: Secondary | ICD-10-CM | POA: Diagnosis not present

## 2014-03-27 DIAGNOSIS — E119 Type 2 diabetes mellitus without complications: Secondary | ICD-10-CM | POA: Diagnosis not present

## 2014-03-27 DIAGNOSIS — Z3202 Encounter for pregnancy test, result negative: Secondary | ICD-10-CM | POA: Diagnosis not present

## 2014-03-27 DIAGNOSIS — F319 Bipolar disorder, unspecified: Secondary | ICD-10-CM | POA: Diagnosis not present

## 2014-03-27 DIAGNOSIS — R51 Headache: Secondary | ICD-10-CM | POA: Diagnosis present

## 2014-03-27 DIAGNOSIS — R05 Cough: Secondary | ICD-10-CM | POA: Insufficient documentation

## 2014-03-27 DIAGNOSIS — R059 Cough, unspecified: Secondary | ICD-10-CM | POA: Diagnosis not present

## 2014-03-27 DIAGNOSIS — R52 Pain, unspecified: Secondary | ICD-10-CM | POA: Insufficient documentation

## 2014-03-27 DIAGNOSIS — R519 Headache, unspecified: Secondary | ICD-10-CM

## 2014-03-27 DIAGNOSIS — Z79899 Other long term (current) drug therapy: Secondary | ICD-10-CM | POA: Diagnosis not present

## 2014-03-27 LAB — POC URINE PREG, ED: Preg Test, Ur: NEGATIVE

## 2014-03-27 MED ORDER — IBUPROFEN 800 MG PO TABS
800.0000 mg | ORAL_TABLET | Freq: Once | ORAL | Status: AC
  Administered 2014-03-27: 800 mg via ORAL
  Filled 2014-03-27: qty 1

## 2014-03-27 MED ORDER — IPRATROPIUM-ALBUTEROL 0.5-2.5 (3) MG/3ML IN SOLN
3.0000 mL | RESPIRATORY_TRACT | Status: DC
  Administered 2014-03-27: 3 mL via RESPIRATORY_TRACT
  Filled 2014-03-27: qty 3

## 2014-03-27 MED ORDER — PREDNISONE 20 MG PO TABS
60.0000 mg | ORAL_TABLET | Freq: Once | ORAL | Status: AC
  Administered 2014-03-27: 60 mg via ORAL
  Filled 2014-03-27: qty 3

## 2014-03-27 MED ORDER — BUTALBITAL-APAP-CAFFEINE 50-325-40 MG PO TABS: 1.0000 | ORAL_TABLET | Freq: Four times a day (QID) | ORAL | Status: DC | PRN

## 2014-03-27 MED ORDER — ACETAMINOPHEN 500 MG PO TABS
1000.0000 mg | ORAL_TABLET | Freq: Once | ORAL | Status: AC
  Administered 2014-03-27: 1000 mg via ORAL
  Filled 2014-03-27: qty 2

## 2014-03-27 NOTE — ED Notes (Signed)
Pt reports progressively worsening HA starting today. 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 AO x4.

## 2014-03-27 NOTE — ED Provider Notes (Signed)
CSN: [id number]     Arrival date & time 03/27/14  1938 History   None    Chief Complaint  Patient presents with  . Headache     (Consider location/radiation/quality/duration/timing/severity/associated sxs/prior Treatment) Patient is a 28 y.o. female presenting with headaches.  Headache Pain location:  Generalized Quality:  Dull Radiates to:  Does not radiate Onset quality:  Gradual Duration:  10 hours Timing:  Constant Progression:  Worsening Chronicity:  Recurrent Similar to prior headaches: yes   Context: not activity, not eating and not intercourse   Relieved by:  None tried Worsened by:  Nothing tried Ineffective treatments:  None tried Associated symptoms: cough   Associated symptoms: no abdominal pain, no back pain, no congestion, no dizziness, no fever, no myalgias, no nausea, no numbness, no paresthesias, no seizures, no vomiting and no weakness     Past Medical History  Diagnosis Date  . Diabetes mellitus   . Asthma   . Bipolar 1 disorder   . Headache(784.0) 01/28/2013  . OSA (obstructive sleep apnea) 01/28/2013   History reviewed. No pertinent past surgical history. Family History  Problem Relation Age of Onset  . CAD Other    History  Substance Use Topics  . Smoking status: Former Smoker    Types: Cigarettes  . Smokeless tobacco: Never Used  . Alcohol Use: No   OB History   Grav Para Term Preterm Abortions TAB SAB Ect Mult Living                 Review of Systems  Constitutional: Negative for fever and activity change.  HENT: Negative for congestion and facial swelling.   Eyes: Negative for discharge and redness.  Respiratory: Positive for cough and shortness of breath.   Cardiovascular: Negative for chest pain and palpitations.  Gastrointestinal: Negative for nausea, vomiting, abdominal pain and abdominal distention.  Endocrine: Negative for polydipsia and polyuria.  Genitourinary: Negative for dysuria and menstrual problem.  Musculoskeletal:  Negative for back pain, joint swelling and myalgias.  Skin: Negative for color change and wound.  Neurological: Positive for headaches. Negative for dizziness, seizures, light-headedness, numbness and paresthesias.      Allergies  Pineapple; Strawberry; Divalproex sodium; and Haldol  Home Medications   Prior to Admission medications   Medication Sig Start Date End Date Taking? Authorizing Provider  albuterol (PROVENTIL HFA;VENTOLIN HFA) 108 (90 BASE) MCG/ACT inhaler Inhale 1-2 puffs into the lungs every 6 (six) hours as needed for wheezing. 07/04/13  Yes Tiffany Marilu Favre, PA-C  ARIPiprazole (ABILIFY) 5 MG tablet Take 2.5 mg by mouth daily.   Yes Historical Provider, MD  Cyanocobalamin (B-12 PO) Take 1 capsule by mouth daily.   Yes Historical Provider, MD  hydrOXYzine (VISTARIL) 50 MG capsule Take 50 mg by mouth daily.  11/03/13  Yes Historical Provider, MD  lamoTRIgine (LAMICTAL) 100 MG tablet Take 100 mg by mouth daily.   Yes Historical Provider, MD  nystatin cream (MYCOSTATIN) Apply 1 application topically daily. Apply bites/rashes   Yes Historical Provider, MD  butalbital-acetaminophen-caffeine (FIORICET, ESGIC) 50-325-40 MG per tablet Take 1 tablet by mouth every 6 (six) hours as needed for headache or migraine. 03/27/14   Merrily Pew, MD   BP 116/80  Pulse 68  Temp(Src) 98.8 F (37.1 C) (Oral)  Resp 18  Ht 5\' 5"  (1.651 m)  Wt 250 lb (113.399 kg)  BMI 41.60 kg/m2  SpO2 98%  LMP 02/20/2014 Physical Exam  Nursing note and vitals reviewed. Constitutional: She is oriented to person,  place, and time. She appears well-developed and well-nourished.  HENT:  Head: Normocephalic and atraumatic.  Eyes: Conjunctivae and EOM are normal. Right eye exhibits no discharge. Left eye exhibits no discharge.  Cardiovascular: Normal rate and regular rhythm.   Pulmonary/Chest: Effort normal. No respiratory distress. She has wheezes.  Abdominal: Soft. She exhibits no distension. There is no  tenderness. There is no rebound.  Musculoskeletal: Normal range of motion. She exhibits no edema and no tenderness.  Neurological: She is alert and oriented to person, place, and time.  No altered mental status, able to give full seemingly accurate history.  Face is symmetric, EOM's intact, pupils equal and reactive, vision intact, tongue and uvula midline without deviation Upper and Lower extremity motor 5/5, intact pain perception in distal extremities, 2+ reflexes in biceps, patella and achilles tendons. Finger to nose normal, heel to shin normal. Walks without assistance or evident ataxia.  Skin: Skin is warm and dry.    ED Course  Procedures (including critical care time) Labs Review Labs Reviewed  POC URINE PREG, ED    Imaging Review No results found.   EKG Interpretation None      MDM   Final diagnoses:  Acute nonintractable headache, unspecified headache type    28 yo F w/ h/o asthma and migraines here with headache. Started while at work today and didn't improve at home. Also with cough and slight SOB. No fevers or other symptoms. Exam with some mild wheezes, non focal neuro, appears comfortable. No temporal ttp. No pupil/vision problems.  Likely primary headache, doubt meningitis, CO poisoning (didn't improve after going home). Headache treated conservatively in ED as patient had to drive home. Fioricet prescribed fro home. Gave duo neb treatment here but patient gave it to her daughter instead making me believe her cough was not a large concern for her.     Merrily Pew, MD 03/28/14 [phone]

## 2014-03-27 NOTE — ED Notes (Signed)
Pt c/o headache for which she normally takes fiorcet but is now out.  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

## 2014-03-27 NOTE — ED Notes (Signed)
Walked into room, child with mouth on breathing tx.  Explained to mother pt was not to have the dose of medication, it was for her as the patient. Pt then given neb tx back to finish.

## 2014-03-27 NOTE — ED Notes (Signed)
Daughter (26 yo) in room being rambunctious and is irritating to mother.  Pt encouraged to relax and allow medications to work on her head pain.

## 2014-03-28 NOTE — ED Provider Notes (Signed)
I saw and evaluated the patient, reviewed the resident's note and I agree with the findings and plan.   EKG Interpretation None        Osvaldo Shipper, MD 03/28/14 2337

## 2014-04-01 ENCOUNTER — Encounter: Payer: Self-pay | Admitting: Internal Medicine

## 2014-04-01 ENCOUNTER — Ambulatory Visit (INDEPENDENT_AMBULATORY_CARE_PROVIDER_SITE_OTHER): Payer: Medicare HMO | Admitting: Internal Medicine

## 2014-04-01 VITALS — BP 120/74 | HR 92 | Ht 65.0 in | Wt 247.0 lb

## 2014-04-01 DIAGNOSIS — R195 Other fecal abnormalities: Secondary | ICD-10-CM

## 2014-04-01 DIAGNOSIS — R634 Abnormal weight loss: Secondary | ICD-10-CM

## 2014-04-01 DIAGNOSIS — R109 Unspecified abdominal pain: Secondary | ICD-10-CM

## 2014-04-01 MED ORDER — MOVIPREP 100 G PO SOLR: 1.0000 | Freq: Once | ORAL | Status: DC

## 2014-04-01 NOTE — Patient Instructions (Signed)

## 2014-04-01 NOTE — Progress Notes (Signed)
HISTORY OF PRESENT ILLNESS:  [name] is a 28 y.o. female with morbid obesity, bipolar disorder, and sleep apnea. She is sent today by the hospital emergency room regarding rectal bleeding. She reports a several month history of intermittent bright red blood per rectum. Several times following lower abdominal pain. She describes red blood adherent to the stool as well as blood only. Last episode was one week ago. She was evaluated for this problem in the emergency room a second 2015. At that time, stool was Hemoccult positive. She did have mild lower abdominal discomfort. No imaging. Blood work was performed and returned unremarkable with normal CBC and comprehensive metabolic panel. Urine pregnancy test negative. She is accompanied today by her daughter. She denies rectal pain associated with defecation. She does have occasional constipation however. She reports to me loss of appetite and "30 pound weight loss since May"  REVIEW OF SYSTEMS:  All non-GI ROS negative upon review  Past Medical History  Diagnosis Date  . Diabetes mellitus   . Asthma   . Bipolar 1 disorder   . Headache(784.0) 01/28/2013  . OSA (obstructive sleep apnea) 01/28/2013    History reviewed. No pertinent past surgical history.  Social History [name]  reports that she has been smoking Cigarettes.  She has been smoking about 0.00 packs per day. She has never used smokeless tobacco. She reports that she does not drink alcohol or use illicit drugs.  family history includes Breast cancer in her maternal grandmother; CAD in her other; Diabetes in her mother; Heart disease in her paternal grandfather and paternal grandmother; Stomach cancer in her paternal grandfather.  Allergies  Allergen Reactions  . Pineapple Swelling  . Strawberry Swelling  . Divalproex Sodium Hives and Rash  . Haldol [Haloperidol] Palpitations and Rash    "difficulty breathing"       PHYSICAL EXAMINATION: Vital signs: BP 120/74   Pulse 92  Ht 5\' 5"  (1.651 m)  Wt 247 lb (112.038 kg)  BMI 41.10 kg/m2  LMP 02/20/2014  Constitutional: Obese, generally well-appearing, no acute distress Psychiatric: alert and oriented x3, cooperative Eyes: extraocular movements intact, anicteric, conjunctiva pink Mouth: oral pharynx moist, no lesions Neck: supple no lymphadenopathy Cardiovascular: heart regular rate and rhythm, no murmur Lungs: clear to auscultation bilaterally Abdomen: soft, obese, nontender, nondistended, no obvious ascites, no peritoneal signs, normal bowel sounds, no organomegaly Rectal: No external abnormalities. No tenderness. No mass. Hemoccult negative stool Extremities: no lower extremity edema bilaterally Skin: no lesions on visible extremities Neuro: No focal deficits.   ASSESSMENT:  #1. Intermittent rectal bleeding as described. Etiology unclear. Negative rectal exam. #2. Mild lower abdominal discomfort. #3. Weight loss. Significant discrepancy between with patient reports and was documented. On 02/06/2014 her weight was 255 pounds. Today, 247 pounds. Totaling 8 pound weight loss in about 2 months. This can be accounted for by decreased appetite, certainly. #4. Morbid obesity. Exercise and weight loss recommended   PLAN:  #1. Colonoscopy.The nature of the procedure, as well as the risks, benefits, and alternatives were carefully and thoroughly reviewed with the patient. Ample time for discussion and questions allowed. The patient understood, was satisfied, and agreed to proceed. Movi prep prescribed. Patient instructed on its use

## 2014-04-07 ENCOUNTER — Encounter: Payer: Self-pay | Admitting: Internal Medicine

## 2014-04-19 ENCOUNTER — Telehealth: Payer: Self-pay | Admitting: Internal Medicine

## 2014-04-19 NOTE — Telephone Encounter (Signed)
Spoke with pt and she is aware.

## 2014-04-19 NOTE — Telephone Encounter (Signed)
Left message for pt to call back.  Pt states she was told by an ER physician in June that she had an irregular heart beat. Looked at notes from ER visit 03/27/14 and note states cardiac rate and rhythm normal.  States she is not on any medication for this and wanted to make sure Dr. Henrene Pastor is aware and that she is ok to proceed with procedure. Please advise.

## 2014-04-19 NOTE — Telephone Encounter (Signed)
As best I can tell, yes it would be fine to proceed

## 2014-04-20 ENCOUNTER — Telehealth: Payer: Self-pay | Admitting: Internal Medicine

## 2014-04-20 NOTE — Telephone Encounter (Signed)
No charge. 

## 2014-04-21 ENCOUNTER — Encounter: Payer: Medicare HMO | Admitting: Internal Medicine

## 2014-04-27 ENCOUNTER — Inpatient Hospital Stay (HOSPITAL_COMMUNITY)
Admission: AD | Admit: 2014-04-27 | Discharge: 2014-04-27 | Disposition: A | Discharge status: Home / Self Care | Payer: Medicare HMO | Source: Ambulatory Visit | Attending: Obstetrics & Gynecology | Admitting: Obstetrics & Gynecology

## 2014-04-27 ENCOUNTER — Encounter (HOSPITAL_COMMUNITY): Payer: Self-pay | Admitting: *Deleted

## 2014-04-27 DIAGNOSIS — G4733 Obstructive sleep apnea (adult) (pediatric): Secondary | ICD-10-CM | POA: Diagnosis not present

## 2014-04-27 DIAGNOSIS — Z3202 Encounter for pregnancy test, result negative: Secondary | ICD-10-CM | POA: Insufficient documentation

## 2014-04-27 DIAGNOSIS — N949 Unspecified condition associated with female genital organs and menstrual cycle: Secondary | ICD-10-CM | POA: Insufficient documentation

## 2014-04-27 DIAGNOSIS — R109 Unspecified abdominal pain: Secondary | ICD-10-CM | POA: Insufficient documentation

## 2014-04-27 DIAGNOSIS — N938 Other specified abnormal uterine and vaginal bleeding: Secondary | ICD-10-CM | POA: Insufficient documentation

## 2014-04-27 DIAGNOSIS — F17200 Nicotine dependence, unspecified, uncomplicated: Secondary | ICD-10-CM | POA: Diagnosis not present

## 2014-04-27 DIAGNOSIS — N925 Other specified irregular menstruation: Secondary | ICD-10-CM | POA: Diagnosis present

## 2014-04-27 DIAGNOSIS — E119 Type 2 diabetes mellitus without complications: Secondary | ICD-10-CM | POA: Insufficient documentation

## 2014-04-27 LAB — URINALYSIS, ROUTINE W REFLEX MICROSCOPIC
Bilirubin Urine: NEGATIVE
Glucose, UA: NEGATIVE mg/dL
Ketones, ur: NEGATIVE mg/dL
Leukocytes, UA: NEGATIVE
Nitrite: NEGATIVE
Protein, ur: NEGATIVE mg/dL
Specific Gravity, Urine: 1.02 (ref 1.005–1.030)
Urobilinogen, UA: 0.2 mg/dL (ref 0.0–1.0)
pH: 6 (ref 5.0–8.0)

## 2014-04-27 LAB — URINE MICROSCOPIC-ADD ON

## 2014-04-27 LAB — HCG, QUANTITATIVE, PREGNANCY: hCG, Beta Chain, Quant, S: <1 m[IU]/mL (ref <5)

## 2014-04-27 LAB — POCT PREGNANCY, URINE: Preg Test, Ur: NEGATIVE

## 2014-04-27 NOTE — Progress Notes (Signed)
Pt states she had passed something then very little bleeding

## 2014-04-27 NOTE — MAU Note (Signed)
Pt states she had a positive pregnancy test at home

## 2014-04-27 NOTE — Discharge Instructions (Signed)
Menstruation °Menstruation is the monthly passing of blood, tissue, fluid and mucus, also know as a period. Your body is shedding the lining of the uterus. The flow, or amount of blood, usually lasts from 3-7 days each month. Hormones control the menstrual cycle. Hormones are a chemical substance produced by endocrine glands in the body to regulate different bodily functions. °The first menstrual period may start any time between age 26 years to 16 years. However, it usually starts around age 12 years. Some girls have regular monthly menstrual cycles right from the beginning. However, it is not unusual to have only a couple of drops of blood or spotting when you first start menstruating. It is also not unusual to have two periods a month or miss a month or two when first starting your periods. °SYMPTOMS  °· Mild to moderate abdominal cramps. °· Aching or pain in the lower back area. °Symptoms may occur 5-10 days before your menstrual period starts. These symptoms are referred to as premenstrual syndrome (PMS). These symptoms can include: °· Headache. °· Breast tenderness and swelling. °· Bloating. °· Tiredness (fatigue). °· Mood changes. °· Craving for certain foods. °These are normal signs and symptoms and can vary in severity. To help relieve these problems, ask your caregiver if you can take over-the-counter medications for pain or discomfort. If the symptoms are not controllable, see your caregiver for help.  °HORMONES INVOLVED IN MENSTRUATION °Menstruation comes about because of hormones produced by the pituitary gland in the brain and the ovaries that affect the uterine lining. °First, the pituitary gland in the brain produces the hormone follicle stimulating hormone (FSH). FSH stimulates the ovaries to produce estrogen, which thickens the uterine lining and begins to develop an egg in the ovary. About 14 days later, the pituitary gland produces another hormone called luteinizing hormone (LH). LH causes the egg  to come out of a sac in the ovary (ovulation). The empty sac on the ovary called the corpus luteum is stimulated by another hormone from the pituitary gland called luteotropin. The corpus luteum begins to produce the estrogen and progesterone hormone. The progesterone hormone prepares the lining of the uterus to have the fertilized egg (egg combined with sperm) attach to the lining of the uterus and begin to develop into a fetus. If the egg is not fertilized, the corpus luteum stops producing estrogen and progesterone, it disappears, the lining of the uterus sloughs off and a menstrual period begins. Then the menstrual cycle starts all over again and will continue monthly unless pregnancy occurs or menopause begins. °The secretion of hormones is complex. Various parts of the body become involved in many chemical activities. Female sex hormones have other functions in a woman's body as well. Estrogen increases a woman's sex drive (libido). It naturally helps body get rid of fluids (diuretic). It also aids in the process of building new bone. Therefore, maintaining hormonal health is essential to all levels of a woman's well being. These hormones are usually present in normal amounts and cause you to menstruate. It is the relationship between the (small) levels of the hormones that is critical. When the balance is upset, menstrual irregularities can occur. °HOW DOES THE MENSTRUAL CYCLE HAPPEN? °· Menstrual cycles vary in length from 21-35 days with an average of 29 days. The cycle begins on the first day of bleeding. At this time, the pituitary gland in the brain releases FSH that travels through the bloodstream to the ovaries. The FSH stimulates the follicles in the   ovaries. This prepares the body for ovulation that occurs around the 14th day of the cycle. The ovaries produce estrogen, and this makes sure conditions are right in the uterus for implantation of the fertilized egg. °· When the levels of estrogen reach a  high enough level, it signals the gland in the brain (pituitary gland) to release a surge of LH. This causes the release of the ripest egg from its follicle (ovulation). Usually only one follicle releases one egg, but sometimes more than one follicle releases an egg especially when stimulating the ovaries for in vitro fertilization. The egg can then be collected by either fallopian tube to await fertilization. The burst follicle within the ovary that is left behind is now called the corpus luteum or "yellow body." The corpus luteum continues to give off (secrete) reduced amounts of estrogen. This closes and hardens the cervix. It dries up the mucus to the naturally infertile condition. °· The corpus luteum also begins to give off greater amounts of progesterone. This causes the lining of the uterus (endometrium) to thicken even more in preparation for the fertilized egg. The egg is starting to journey down from the fallopian tube to the uterus. It also signals the ovaries to stop releasing eggs. It assists in returning the cervical mucus to its infertile state. °· If the egg implants successfully into the womb lining and pregnancy occurs, progesterone levels will continue to raise. It is often this hormone that gives some pregnant women a feeling of well being, like a "natural high." Progesterone levels drop again after childbirth. °· If fertilization does not occur, the corpus luteum dies, stopping the production of hormones. This sudden drop in progesterone causes the uterine lining to break down, accompanied by blood (menstruation). °· This starts the cycle back at day 1. The whole process starts all over again. Woman go through this cycle every month from puberty to menopause. Women have breaks only for pregnancy and breastfeeding (lactation), unless the woman has health problems that affect the female hormone system or chooses to use oral contraceptives to have unnatural menstrual periods. °HOME CARE  INSTRUCTIONS  °· Keep track of your periods by using a calendar. °· If you use tampons, get the least absorbent to avoid toxic shock syndrome. °· Do not leave tampons in the vagina over night or longer than 6 hours. °· Wear a sanitary pad over night. °· Exercise 3-5 times a week or more. °· Avoid foods and drinks that you know will make your symptoms worse before or during your period. °SEEK MEDICAL CARE IF:  °· You develop a fever with your period. °· Your periods are lasting more than 7 days. °· Your period is so heavy that you have to change pads or tampons every 30 minutes. °· You develop clots with your period and never had clots before. °· You cannot get relief from over-the-counter medication for your symptoms. °· Your period has not started, and it has been longer than 35 days. °Document Released: 09/14/2002 Document Revised: 09/29/2013 Document Reviewed: 04/23/2013 °Exit[employer]® Patient Information ©2015 [employer]. This information is not intended to replace advice given to you by your health care provider. Make sure you discuss any questions you have with your health care provider. ° °

## 2014-04-27 NOTE — MAU Note (Signed)
Pt reports LMP 03/24/2014, positive preg test last week, today passed large amount of blood and ? Clots. Had lower abd pain and lower back pain at the same time she had the bleeding.

## 2014-04-27 NOTE — MAU Provider Note (Signed)
History     CSN: [id number]  Arrival date and time: 04/27/14 1936   None     Chief Complaint  Patient presents with  . Possible Pregnancy  . Vaginal Bleeding  . Abdominal Pain   Possible Pregnancy Associated symptoms include abdominal pain.  Vaginal Bleeding Associated symptoms include abdominal pain.  Abdominal Pain   [name] is a 28 y.o. a G1P0 who presents today with vaginal bleeding. She states that she has been bleeding or two days, and today she passed a small clot. She denies any pain. She states that she had a +UPT at home. She states that her LMP prior to this was 03/24/14.  Past Medical History  Diagnosis Date  . Diabetes mellitus   . Asthma   . Bipolar 1 disorder   . Headache(784.0) 01/28/2013  . OSA (obstructive sleep apnea) 01/28/2013    History reviewed. No pertinent past surgical history.  Family History  Problem Relation Age of Onset  . CAD Other   . Diabetes Mother   . Breast cancer Maternal Grandmother   . Heart disease Paternal Grandmother   . Stomach cancer Paternal Grandfather   . Heart disease Paternal Grandfather     History  Substance Use Topics  . Smoking status: Current Every Day Smoker    Types: Cigarettes  . Smokeless tobacco: Never Used  . Alcohol Use: No    Allergies:  Allergies  Allergen Reactions  . Pineapple Swelling  . Strawberry Swelling  . Divalproex Sodium Hives and Rash  . Haldol [Haloperidol] Palpitations and Rash    "difficulty breathing"    Prescriptions prior to admission  Medication Sig Dispense Refill  . albuterol (PROVENTIL HFA;VENTOLIN HFA) 108 (90 BASE) MCG/ACT inhaler Inhale 1-2 puffs into the lungs every 6 (six) hours as needed for wheezing.  1 Inhaler  2  . ARIPiprazole (ABILIFY) 5 MG tablet Take 2.5 mg by mouth daily.      . butalbital-acetaminophen-caffeine (FIORICET, ESGIC) 50-325-40 MG per tablet Take 1 tablet by mouth every 6 (six) hours as needed for headache or migraine.  10 tablet  0  .  hydrOXYzine (VISTARIL) 50 MG capsule Take 50 mg by mouth daily.       Marland Kitchen lamoTRIgine (LAMICTAL) 100 MG tablet Take 100 mg by mouth daily.      . Vitamin D, Ergocalciferol, (DRISDOL) 50000 UNITS CAPS capsule Take 50,000 Units by mouth every 7 (seven) days. fridays        Review of Systems  Gastrointestinal: Positive for abdominal pain.  Genitourinary: Positive for vaginal bleeding.   Physical Exam   Blood pressure 123/78, pulse 78, temperature 99 F (37.2 C), temperature source Oral, resp. rate 18, height 5\' 5"  (1.651 m), weight 114.306 kg (252 lb), last menstrual period 03/24/2014, SpO2 99.00%.  Physical Exam  Nursing note and vitals reviewed. Constitutional: She is oriented to person, place, and time. She appears well-developed and well-nourished. No distress.  Cardiovascular: Normal rate.   Respiratory: Effort normal.  GI: Soft. There is no tenderness.  Neurological: She is alert and oriented to person, place, and time.  Skin: Skin is warm and dry.  Psychiatric: She has a normal mood and affect.    MAU Course  Procedures Results for orders placed during the hospital encounter of 04/27/14 (from the past 24 hour(s))  URINALYSIS, ROUTINE W REFLEX MICROSCOPIC     Status: Abnormal   Collection Time    04/27/14  7:49 PM      Result Value Ref  Range   Color, Urine YELLOW  YELLOW   APPearance CLEAR  CLEAR   Specific Gravity, Urine 1.020  1.005 - 1.030   pH 6.0  5.0 - 8.0   Glucose, UA NEGATIVE  NEGATIVE mg/dL   Hgb urine dipstick LARGE (*) NEGATIVE   Bilirubin Urine NEGATIVE  NEGATIVE   Ketones, ur NEGATIVE  NEGATIVE mg/dL   Protein, ur NEGATIVE  NEGATIVE mg/dL   Urobilinogen, UA 0.2  0.0 - 1.0 mg/dL   Nitrite NEGATIVE  NEGATIVE   Leukocytes, UA NEGATIVE  NEGATIVE  URINE MICROSCOPIC-ADD ON     Status: Abnormal   Collection Time    04/27/14  7:49 PM      Result Value Ref Range   Squamous Epithelial / LPF FEW (*) RARE   WBC, UA 0-2  <3 WBC/hpf   RBC / HPF 3-6  <3 RBC/hpf    Bacteria, UA FEW (*) RARE   Urine-Other MUCOUS PRESENT    POCT PREGNANCY, URINE     Status: None   Collection Time    04/27/14  7:57 PM      Result Value Ref Range   Preg Test, Ur NEGATIVE  NEGATIVE  HCG, QUANTITATIVE, PREGNANCY     Status: None   Collection Time    04/27/14  8:28 PM      Result Value Ref Range   hCG, Beta Chain, Quant, S <1  <5 mIU/mL     Assessment and Plan   1. Encounter for pregnancy test with result negative    Return to MAU as needed  Follow-up Information   Schedule an appointment as soon as possible for a visit with [facility].   Contact information:   [address] [place] [address] [phone]       Mathis Bud 04/27/2014, 9:18 PM

## 2014-05-03 ENCOUNTER — Encounter: Payer: Medicare HMO | Admitting: Internal Medicine

## 2014-05-03 ENCOUNTER — Telehealth: Payer: Self-pay | Admitting: Internal Medicine

## 2014-05-03 NOTE — Telephone Encounter (Signed)
Events noted. She needs to be seen in the office before rescheduling her procedure

## 2014-06-07 ENCOUNTER — Encounter (HOSPITAL_COMMUNITY): Payer: Self-pay | Admitting: Emergency Medicine

## 2014-06-07 ENCOUNTER — Emergency Department (HOSPITAL_COMMUNITY): Payer: Medicare HMO

## 2014-06-07 ENCOUNTER — Emergency Department (HOSPITAL_COMMUNITY)
Admission: EM | Admit: 2014-06-07 | Discharge: 2014-06-07 | Disposition: A | Discharge status: Home / Self Care | Payer: Medicare HMO | Attending: Emergency Medicine | Admitting: Emergency Medicine

## 2014-06-07 DIAGNOSIS — J45901 Unspecified asthma with (acute) exacerbation: Secondary | ICD-10-CM | POA: Diagnosis not present

## 2014-06-07 DIAGNOSIS — F319 Bipolar disorder, unspecified: Secondary | ICD-10-CM | POA: Diagnosis not present

## 2014-06-07 DIAGNOSIS — Z8669 Personal history of other diseases of the nervous system and sense organs: Secondary | ICD-10-CM | POA: Insufficient documentation

## 2014-06-07 DIAGNOSIS — J069 Acute upper respiratory infection, unspecified: Secondary | ICD-10-CM | POA: Diagnosis not present

## 2014-06-07 DIAGNOSIS — R55 Syncope and collapse: Secondary | ICD-10-CM | POA: Insufficient documentation

## 2014-06-07 DIAGNOSIS — Z3202 Encounter for pregnancy test, result negative: Secondary | ICD-10-CM | POA: Diagnosis not present

## 2014-06-07 DIAGNOSIS — R05 Cough: Secondary | ICD-10-CM | POA: Diagnosis present

## 2014-06-07 DIAGNOSIS — Z79899 Other long term (current) drug therapy: Secondary | ICD-10-CM | POA: Insufficient documentation

## 2014-06-07 DIAGNOSIS — F17200 Nicotine dependence, unspecified, uncomplicated: Secondary | ICD-10-CM | POA: Insufficient documentation

## 2014-06-07 DIAGNOSIS — R059 Cough, unspecified: Secondary | ICD-10-CM | POA: Diagnosis present

## 2014-06-07 DIAGNOSIS — E119 Type 2 diabetes mellitus without complications: Secondary | ICD-10-CM | POA: Diagnosis not present

## 2014-06-07 LAB — POC URINE PREG, ED: Preg Test, Ur: NEGATIVE

## 2014-06-07 MED ORDER — PREDNISONE 20 MG PO TABS: ORAL_TABLET | ORAL | Status: DC

## 2014-06-07 NOTE — Discharge Instructions (Signed)
Upper Respiratory Infection, Adult An upper respiratory infection (URI) is also sometimes known as the common cold. The upper respiratory tract includes the nose, sinuses, throat, trachea, and bronchi. Bronchi are the airways leading to the lungs. Most people improve within 1 week, but symptoms can last up to 2 weeks. A residual cough may last even longer.  CAUSES Many different viruses can infect the tissues lining the upper respiratory tract. The tissues become irritated and inflamed and often become very moist. Mucus production is also common. A cold is contagious. You can easily spread the virus to others by oral contact. This includes kissing, sharing a glass, coughing, or sneezing. Touching your mouth or nose and then touching a surface, which is then touched by another person, can also spread the virus. SYMPTOMS  Symptoms typically develop 1 to 3 days after you come in contact with a cold virus. Symptoms vary from person to person. They may include:  Runny nose.  Sneezing.  Nasal congestion.  Sinus irritation.  Sore throat.  Loss of voice (laryngitis).  Cough.  Fatigue.  Muscle aches.  Loss of appetite.  Headache.  Low-grade fever. DIAGNOSIS  You might diagnose your own cold based on familiar symptoms, since most people get a cold 2 to 3 times a year. Your caregiver can confirm this based on your exam. Most importantly, your caregiver can check that your symptoms are not due to another disease such as strep throat, sinusitis, pneumonia, asthma, or epiglottitis. Blood tests, throat tests, and X-rays are not necessary to diagnose a common cold, but they may sometimes be helpful in excluding other more serious diseases. Your caregiver will decide if any further tests are required. RISKS AND COMPLICATIONS  You may be at risk for a more severe case of the common cold if you smoke cigarettes, have chronic heart disease (such as heart failure) or lung disease (such as asthma), or if  you have a weakened immune system. The very young and very old are also at risk for more serious infections. Bacterial sinusitis, middle ear infections, and bacterial pneumonia can complicate the common cold. The common cold can worsen asthma and chronic obstructive pulmonary disease (COPD). Sometimes, these complications can require emergency medical care and may be life-threatening. PREVENTION  The best way to protect against getting a cold is to practice good hygiene. Avoid oral or hand contact with people with cold symptoms. Wash your hands often if contact occurs. There is no clear evidence that vitamin C, vitamin E, echinacea, or exercise reduces the chance of developing a cold. However, it is always recommended to get plenty of rest and practice good nutrition. TREATMENT  Treatment is directed at relieving symptoms. There is no cure. Antibiotics are not effective, because the infection is caused by a virus, not by bacteria. Treatment may include:  Increased fluid intake. Sports drinks offer valuable electrolytes, sugars, and fluids.  Breathing heated mist or steam (vaporizer or shower).  Eating chicken soup or other clear broths, and maintaining good nutrition.  Getting plenty of rest.  Using gargles or lozenges for comfort.  Controlling fevers with ibuprofen or acetaminophen as directed by your caregiver.  Increasing usage of your inhaler if you have asthma. Zinc gel and zinc lozenges, taken in the first 24 hours of the common cold, can shorten the duration and lessen the severity of symptoms. Pain medicines may help with fever, muscle aches, and throat pain. A variety of non-prescription medicines are available to treat congestion and runny nose. Your caregiver   can make recommendations and may suggest nasal or lung inhalers for other symptoms.  HOME CARE INSTRUCTIONS   Only take over-the-counter or prescription medicines for pain, discomfort, or fever as directed by your  caregiver.  Use a warm mist humidifier or inhale steam from a shower to increase air moisture. This may keep secretions moist and make it easier to breathe.  Drink enough water and fluids to keep your urine clear or pale yellow.  Rest as needed.  Return to work when your temperature has returned to normal or as your caregiver advises. You may need to stay home longer to avoid infecting others. You can also use a face mask and careful hand washing to prevent spread of the virus. SEEK MEDICAL CARE IF:   After the first few days, you feel you are getting worse rather than better.  You need your caregiver's advice about medicines to control symptoms.  You develop chills, worsening shortness of breath, or brown or red sputum. These may be signs of pneumonia.  You develop yellow or brown nasal discharge or pain in the face, especially when you bend forward. These may be signs of sinusitis.  You develop a fever, swollen neck glands, pain with swallowing, or white areas in the back of your throat. These may be signs of strep throat. SEEK IMMEDIATE MEDICAL CARE IF:   You have a fever.  You develop severe or persistent headache, ear pain, sinus pain, or chest pain.  You develop wheezing, a prolonged cough, cough up blood, or have a change in your usual mucus (if you have chronic lung disease).  You develop sore muscles or a stiff neck. Document Released: 03/20/2001 Document Revised: 12/17/2011 Document Reviewed: 12/30/2013 [employer] Patient Information 2015 [employer]. This information is not intended to replace advice given to you by your health care provider. Make sure you discuss any questions you have with your health care provider.  

## 2014-06-07 NOTE — ED Notes (Signed)
Per EMS pt coming from home with c/o cough, runny nose and congestion x 3 days. Other family members at home with similar symptoms. Per EMS pt has hx of asthma and has been taking her medications.

## 2014-06-07 NOTE — ED Provider Notes (Signed)
CSN: [id number]     Arrival date & time 06/07/14  1055 History   First MD Initiated Contact with Patient 06/07/14 1103     Chief Complaint  Patient presents with  . Cough  . URI     (Consider location/radiation/quality/duration/timing/severity/associated sxs/prior Treatment) HPI Comments: Patient presents with cough and cold symptoms. She states the symptoms started about 3 days ago in the beginning worse. She has runny nose and nasal congestion. She also has a cough which is mostly dry but also productive at times. She denies he fevers or chills. She denies any chest pain. She does have a history of asthma and has had some increased wheezing and shortness of breath. She's been using her albuterol inhaler more frequently over the last few days. This seems to help her wheezing.  Patient is a 28 y.o. female presenting with cough and URI.  Cough Associated symptoms: rhinorrhea, shortness of breath and wheezing   Associated symptoms: no chest pain, no chills, no diaphoresis, no fever, no headaches and no rash   URI Presenting symptoms: congestion, cough, fatigue and rhinorrhea   Presenting symptoms: no fever   Associated symptoms: sneezing and wheezing   Associated symptoms: no arthralgias and no headaches     Past Medical History  Diagnosis Date  . Diabetes mellitus   . Asthma   . Bipolar 1 disorder   . Headache(784.0) 01/28/2013  . OSA (obstructive sleep apnea) 01/28/2013   No past surgical history on file. Family History  Problem Relation Age of Onset  . CAD Other   . Diabetes Mother   . Breast cancer Maternal Grandmother   . Heart disease Paternal Grandmother   . Stomach cancer Paternal Grandfather   . Heart disease Paternal Grandfather    History  Substance Use Topics  . Smoking status: Current Every Day Smoker    Types: Cigarettes  . Smokeless tobacco: Never Used  . Alcohol Use: No   OB History   Grav Para Term Preterm Abortions TAB SAB Ect Mult Living   1         1      Review of Systems  Constitutional: Positive for fatigue. Negative for fever, chills and diaphoresis.  HENT: Positive for congestion, postnasal drip, rhinorrhea, sinus pressure and sneezing.   Eyes: Negative.   Respiratory: Positive for cough, shortness of breath and wheezing. Negative for chest tightness.   Cardiovascular: Negative for chest pain and leg swelling.  Gastrointestinal: Negative for nausea, vomiting, abdominal pain, diarrhea and blood in stool.  Genitourinary: Negative for frequency, hematuria, flank pain and difficulty urinating.  Musculoskeletal: Negative for arthralgias and back pain.  Skin: Negative for rash.  Neurological: Negative for dizziness, speech difficulty, weakness, numbness and headaches.      Allergies  Pineapple; Strawberry; Divalproex sodium; and Haldol  Home Medications   Prior to Admission medications   Medication Sig Start Date End Date Taking? Authorizing Provider  albuterol (PROVENTIL HFA;VENTOLIN HFA) 108 (90 BASE) MCG/ACT inhaler Inhale 2 puffs into the lungs every 6 (six) hours as needed for wheezing or shortness of breath.   Yes Historical Provider, MD  ARIPiprazole (ABILIFY) 5 MG tablet Take 2.5 mg by mouth at bedtime.    Yes Historical Provider, MD  butalbital-acetaminophen-caffeine (FIORICET, ESGIC) 50-325-40 MG per tablet Take 1 tablet by mouth every 6 (six) hours as needed for headache or migraine.   Yes Historical Provider, MD  hydrOXYzine (VISTARIL) 50 MG capsule Take 50 mg by mouth daily.  11/03/13  Yes Historical  Provider, MD  lamoTRIgine (LAMICTAL) 100 MG tablet Take 100 mg by mouth daily.   Yes Historical Provider, MD  predniSONE (DELTASONE) 20 MG tablet 3 tabs po day one, then 2 po daily x 4 days 06/07/14   Malvin Johns, MD   BP 122/60  Pulse 58  Temp(Src) 97.9 F (36.6 C) (Oral)  Resp 18  SpO2 100%  LMP 05/07/2014 Physical Exam  Constitutional: She is oriented to person, place, and time. She appears well-developed and  well-nourished.  HENT:  Head: Normocephalic and atraumatic.  Right Ear: External ear normal.  Left Ear: External ear normal.  Mouth/Throat: Oropharynx is clear and moist.  Eyes: Pupils are equal, round, and reactive to light.  Neck: Normal range of motion. Neck supple.  Cardiovascular: Normal rate, regular rhythm and normal heart sounds.   Pulmonary/Chest: Effort normal and breath sounds normal. No respiratory distress. She has no wheezes. She has no rales. She exhibits no tenderness.  Abdominal: Soft. Bowel sounds are normal. There is no tenderness. There is no rebound and no guarding.  Musculoskeletal: Normal range of motion. She exhibits no edema.  Lymphadenopathy:    She has no cervical adenopathy.  Neurological: She is alert and oriented to person, place, and time.  Skin: Skin is warm and dry. No rash noted.  Psychiatric: She has a normal mood and affect.    ED Course  Procedures (including critical care time) Labs Review Labs Reviewed  POC URINE PREG, ED    Imaging Review Dg Chest 2 View  06/07/2014   CLINICAL DATA:  Cough.  Congestion.  Chest pain.  EXAM: CHEST  2 VIEW  COMPARISON:  08/07/2013  FINDINGS: The heart size and mediastinal contours are within normal limits. Both lungs are clear. The visualized skeletal structures are unremarkable.  IMPRESSION: No active cardiopulmonary disease.   Electronically Signed   By: Sherryl Barters M.D.   On: 06/07/2014 12:32     EKG Interpretation None      MDM   Final diagnoses:  URI (upper respiratory infection)    Patient presents with cough and upper respiratory congestion. She has been using her inhaler more frequent. Her lungs are clear on my exam. Her chest x-ray is negative for pneumonia. She was discharged home in good condition. She was given a prescription for a five-day course of prednisone. She is advised to use her albuterol inhaler every 4-6 hours for the next few days. She was encouraged to use over-the-counter  cold medicines for symptomatic relief. She was advised to followup with her primary care physician if her symptoms are not improving or return here as needed for any worsening symptoms    Malvin Johns, MD 06/07/14 1321

## 2014-06-07 NOTE — ED Notes (Signed)
Bed: JJ88 Expected date:  Expected time:  Means of arrival:  Comments: EMS-cough

## 2014-06-07 NOTE — ED Notes (Signed)
Pt c/o congestion, generalized body aches, sore throat and runny nose, sts two family members at home have pneumonia. Pt is behaving somewhat bizarre during assessment, overly dramatic, stating "Oh, help me before I die" and bouncing around on the stretcher.

## 2014-07-01 ENCOUNTER — Encounter (HOSPITAL_COMMUNITY): Payer: Self-pay | Admitting: Emergency Medicine

## 2014-07-01 ENCOUNTER — Emergency Department (INDEPENDENT_AMBULATORY_CARE_PROVIDER_SITE_OTHER)
Admission: EM | Admit: 2014-07-01 | Discharge: 2014-07-01 | Disposition: A | Discharge status: Home / Self Care | Payer: Medicare HMO | Source: Home / Self Care

## 2014-07-01 DIAGNOSIS — G44219 Episodic tension-type headache, not intractable: Secondary | ICD-10-CM

## 2014-07-01 MED ORDER — KETOROLAC TROMETHAMINE 60 MG/2ML IM SOLN
60.0000 mg | Freq: Once | INTRAMUSCULAR | Status: AC
  Administered 2014-07-01: 60 mg via INTRAMUSCULAR

## 2014-07-01 MED ORDER — TIZANIDINE HCL 2 MG PO TABS: ORAL_TABLET | ORAL | Status: DC

## 2014-07-01 MED ORDER — DEXAMETHASONE SODIUM PHOSPHATE 10 MG/ML IJ SOLN
INTRAMUSCULAR | Status: AC
  Filled 2014-07-01: qty 1

## 2014-07-01 MED ORDER — KETOROLAC TROMETHAMINE 10 MG PO TABS: 10.0000 mg | ORAL_TABLET | Freq: Four times a day (QID) | ORAL | Status: DC | PRN

## 2014-07-01 MED ORDER — DEXAMETHASONE SODIUM PHOSPHATE 10 MG/ML IJ SOLN
10.0000 mg | Freq: Once | INTRAMUSCULAR | Status: AC
  Administered 2014-07-01: 10 mg via INTRAMUSCULAR

## 2014-07-01 MED ORDER — KETOROLAC TROMETHAMINE 60 MG/2ML IM SOLN
INTRAMUSCULAR | Status: AC
  Filled 2014-07-01: qty 2

## 2014-07-01 NOTE — ED Notes (Signed)
Pt states that she has been having a severe headache for the past 2 days taking OTC tension headache relief and OTC cold medication. She states that nothing is helping her headache. Pt is in no acute distress at this time.

## 2014-07-01 NOTE — Discharge Instructions (Signed)

## 2014-07-01 NOTE — ED Provider Notes (Signed)
CSN: [id number]     Arrival date & time 07/01/14  3545 History   First MD Initiated Contact with Patient 07/01/14 0830     Chief Complaint  Patient presents with  . Migraine   (Consider location/radiation/quality/duration/timing/severity/associated sxs/prior Treatment) HPI Comments: C/O headache for 2 d. Pain started in the posterior neck, occiput and forehead. No hx of trauma. Worse with rotation and extension of head/neck, st muscle surrounding th neck are sore. Photophobia, no nausea or vomiting, problems with vision, speech, hearing or swallowing.  Taking OTC migraine meds .   Past Medical History  Diagnosis Date  . Diabetes mellitus   . Asthma   . Bipolar 1 disorder   . Headache(784.0) 01/28/2013  . OSA (obstructive sleep apnea) 01/28/2013   History reviewed. No pertinent past surgical history. Family History  Problem Relation Age of Onset  . CAD Other   . Diabetes Mother   . Breast cancer Maternal Grandmother   . Heart disease Paternal Grandmother   . Stomach cancer Paternal Grandfather   . Heart disease Paternal Grandfather    History  Substance Use Topics  . Smoking status: Current Every Day Smoker    Types: Cigarettes  . Smokeless tobacco: Never Used  . Alcohol Use: No   OB History   Grav Para Term Preterm Abortions TAB SAB Ect Mult Living   1         1     Review of Systems  Constitutional: Positive for activity change. Negative for fever.  HENT: Negative for congestion, ear pain, hearing loss, rhinorrhea, sore throat and trouble swallowing.   Eyes: Negative for photophobia, pain, discharge, redness and itching.  Respiratory: Negative for cough, choking, chest tightness and shortness of breath.   Cardiovascular: Negative for chest pain.  Gastrointestinal: Negative for nausea, vomiting, abdominal pain and blood in stool.  Genitourinary: Negative.   Skin: Negative.   Neurological: Positive for headaches. Negative for dizziness, tremors, syncope, facial  asymmetry and speech difficulty.    Allergies  Pineapple; Strawberry; Divalproex sodium; and Haldol  Home Medications   Prior to Admission medications   Medication Sig Start Date End Date Taking? Authorizing Provider  albuterol (PROVENTIL HFA;VENTOLIN HFA) 108 (90 BASE) MCG/ACT inhaler Inhale 2 puffs into the lungs every 6 (six) hours as needed for wheezing or shortness of breath.   Yes Historical Provider, MD  ARIPiprazole (ABILIFY) 5 MG tablet Take 2.5 mg by mouth at bedtime.    Yes Historical Provider, MD  butalbital-acetaminophen-caffeine (FIORICET, ESGIC) 50-325-40 MG per tablet Take 1 tablet by mouth every 6 (six) hours as needed for headache or migraine.   Yes Historical Provider, MD  hydrOXYzine (VISTARIL) 50 MG capsule Take 50 mg by mouth daily.  11/03/13  Yes Historical Provider, MD  lamoTRIgine (LAMICTAL) 100 MG tablet Take 100 mg by mouth daily.   Yes Historical Provider, MD  ketorolac (TORADOL) 10 MG tablet Take 1 tablet (10 mg total) by mouth 4 (four) times daily as needed. 07/01/14   Janne Napoleon, NP  tiZANidine (ZANAFLEX) 2 MG tablet 1 tab po q 6 h prn prn muscle tension 07/01/14   Janne Napoleon, NP   BP 131/87  Pulse 70  Temp(Src) 98.7 F (37.1 C) (Oral)  Resp 16  SpO2 100%  LMP 06/29/2014 Physical Exam  Nursing note and vitals reviewed. Constitutional: She is oriented to person, place, and time. She appears well-developed. No distress.  obese  HENT:  Right Ear: External ear normal.  Left Ear: External ear normal.  Mouth/Throat: Oropharynx is clear and moist. No oropharyngeal exudate.  Eyes: Conjunctivae and EOM are normal. Pupils are equal, round, and reactive to light.  Neck: Normal range of motion. Neck supple.  Tenderness to para cervical musculature as well as occipital and frontal musculature of scalp.  Cardiovascular: Normal rate, regular rhythm, normal heart sounds and intact distal pulses.   No murmur heard. Pulmonary/Chest: Effort normal and breath sounds  normal. No respiratory distress. She has no wheezes. She has no rales.  Musculoskeletal: She exhibits no edema.  Lymphadenopathy:    She has no cervical adenopathy.  Neurological: She is alert and oriented to person, place, and time. No cranial nerve deficit. She exhibits normal muscle tone. Coordination normal.  Skin: Skin is warm and dry.    ED Course  Procedures (including critical care time) Labs Review Labs Reviewed - No data to display  Imaging Review No results found.   MDM   1. Episodic tension-type headache, not intractable    toradol 60 mg IM Decadron 10 mg IM toradol 10 mg q 6-8h prn zanaflex 2mg  q 8h prn F/U wit PCP as needed      Janne Napoleon, NP 07/01/14 0900

## 2014-07-03 NOTE — ED Provider Notes (Signed)
Medical screening examination/treatment/procedure(s) were performed by a resident physician or non-physician practitioner and as the supervising physician I was immediately available for consultation/collaboration.  Lynne Leader, MD    Gregor Hams, MD 07/03/14 [phone]

## 2014-08-09 ENCOUNTER — Encounter (HOSPITAL_COMMUNITY): Payer: Self-pay | Admitting: Emergency Medicine

## 2014-08-28 ENCOUNTER — Emergency Department (INDEPENDENT_AMBULATORY_CARE_PROVIDER_SITE_OTHER)
Admission: EM | Admit: 2014-08-28 | Discharge: 2014-08-28 | Disposition: A | Discharge status: Home / Self Care | Payer: Medicare HMO | Source: Home / Self Care | Attending: Emergency Medicine | Admitting: Emergency Medicine

## 2014-08-28 ENCOUNTER — Other Ambulatory Visit (HOSPITAL_COMMUNITY)
Admission: RE | Admit: 2014-08-28 | Discharge: 2014-08-28 | Disposition: A | Discharge status: Home / Self Care | Payer: Medicare HMO | Source: Ambulatory Visit | Attending: Emergency Medicine | Admitting: Emergency Medicine

## 2014-08-28 ENCOUNTER — Encounter (HOSPITAL_COMMUNITY): Payer: Self-pay | Admitting: *Deleted

## 2014-08-28 DIAGNOSIS — N760 Acute vaginitis: Secondary | ICD-10-CM | POA: Diagnosis present

## 2014-08-28 DIAGNOSIS — Z113 Encounter for screening for infections with a predominantly sexual mode of transmission: Secondary | ICD-10-CM | POA: Diagnosis present

## 2014-08-28 DIAGNOSIS — N898 Other specified noninflammatory disorders of vagina: Secondary | ICD-10-CM

## 2014-08-28 LAB — RPR

## 2014-08-28 LAB — POCT URINALYSIS DIP (DEVICE)
Bilirubin Urine: NEGATIVE
Glucose, UA: NEGATIVE mg/dL
Ketones, ur: NEGATIVE mg/dL
Leukocytes, UA: NEGATIVE
Nitrite: NEGATIVE
Protein, ur: NEGATIVE mg/dL
Specific Gravity, Urine: 1.02 (ref 1.005–1.030)
Urobilinogen, UA: 0.2 mg/dL (ref 0.0–1.0)
pH: 7 (ref 5.0–8.0)

## 2014-08-28 LAB — HIV ANTIBODY (ROUTINE TESTING W REFLEX): HIV 1&2 Ab, 4th Generation: NONREACTIVE

## 2014-08-28 LAB — POCT PREGNANCY, URINE: Preg Test, Ur: NEGATIVE

## 2014-08-28 MED ORDER — CEFTRIAXONE SODIUM 250 MG IJ SOLR
250.0000 mg | Freq: Once | INTRAMUSCULAR | Status: AC
  Administered 2014-08-28: 250 mg via INTRAMUSCULAR

## 2014-08-28 MED ORDER — AZITHROMYCIN 250 MG PO TABS
1000.0000 mg | ORAL_TABLET | Freq: Once | ORAL | Status: AC
  Administered 2014-08-28: 1000 mg via ORAL

## 2014-08-28 MED ORDER — CEFTRIAXONE SODIUM 250 MG IJ SOLR
INTRAMUSCULAR | Status: AC
  Filled 2014-08-28: qty 250

## 2014-08-28 MED ORDER — LIDOCAINE HCL (PF) 1 % IJ SOLN
INTRAMUSCULAR | Status: AC
  Filled 2014-08-28: qty 5

## 2014-08-28 MED ORDER — AZITHROMYCIN 250 MG PO TABS
ORAL_TABLET | ORAL | Status: AC
  Filled 2014-08-28: qty 4

## 2014-08-28 NOTE — ED Notes (Signed)
C/O right groin lymphadenopathy x 1 wk; also started with vaginal discharge 1 wk ago.  Has also noticed lesion to thigh and buttock area.
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
## 2014-08-28 NOTE — ED Notes (Signed)
S/S allergic reaction reviewed w/ pt.  Instructed to get a staff member immediately if any.

## 2014-08-30 LAB — CERVICOVAGINAL ANCILLARY ONLY
Chlamydia: NEGATIVE
Neisseria Gonorrhea: NEGATIVE
Wet Prep (BD Affirm): NEGATIVE
Wet Prep (BD Affirm): NEGATIVE
Wet Prep (BD Affirm): NEGATIVE

## 2014-09-03 NOTE — ED Notes (Signed)
Review of lab reports. All reports to date are negative. Negative for GC/chlamydia, gardnerella, yeast, RPR, HIV

## 2015-01-23 ENCOUNTER — Emergency Department (HOSPITAL_COMMUNITY)
Admission: EM | Admit: 2015-01-23 | Discharge: 2015-01-24 | Disposition: A | Discharge status: Home / Self Care | Payer: Medicare HMO | Attending: Emergency Medicine | Admitting: Emergency Medicine

## 2015-01-23 ENCOUNTER — Emergency Department (HOSPITAL_COMMUNITY): Payer: Medicare HMO

## 2015-01-23 ENCOUNTER — Encounter (HOSPITAL_COMMUNITY): Payer: Self-pay | Admitting: Emergency Medicine

## 2015-01-23 DIAGNOSIS — Z79899 Other long term (current) drug therapy: Secondary | ICD-10-CM | POA: Diagnosis not present

## 2015-01-23 DIAGNOSIS — E669 Obesity, unspecified: Secondary | ICD-10-CM | POA: Diagnosis not present

## 2015-01-23 DIAGNOSIS — E119 Type 2 diabetes mellitus without complications: Secondary | ICD-10-CM | POA: Diagnosis not present

## 2015-01-23 DIAGNOSIS — F419 Anxiety disorder, unspecified: Secondary | ICD-10-CM | POA: Insufficient documentation

## 2015-01-23 DIAGNOSIS — Z8679 Personal history of other diseases of the circulatory system: Secondary | ICD-10-CM | POA: Diagnosis not present

## 2015-01-23 DIAGNOSIS — Z8669 Personal history of other diseases of the nervous system and sense organs: Secondary | ICD-10-CM | POA: Diagnosis not present

## 2015-01-23 DIAGNOSIS — J45901 Unspecified asthma with (acute) exacerbation: Secondary | ICD-10-CM | POA: Insufficient documentation

## 2015-01-23 DIAGNOSIS — R0602 Shortness of breath: Secondary | ICD-10-CM | POA: Diagnosis present

## 2015-01-23 LAB — I-STAT ARTERIAL BLOOD GAS, ED
Acid-base deficit: 2 mmol/L (ref 0.0–2.0)
Bicarbonate: 21.9 mEq/L (ref 20.0–24.0)
O2 Saturation: 99 %
Patient temperature: 97.9
TCO2: 23 mmol/L (ref 0–100)
pCO2 arterial: 34.8 mmHg — ABNORMAL LOW (ref 35.0–45.0)
pH, Arterial: 7.404 (ref 7.350–7.450)
pO2, Arterial: 147 mmHg — ABNORMAL HIGH (ref 80.0–100.0)

## 2015-01-23 LAB — CBC WITH DIFFERENTIAL/PLATELET
Basophils Absolute: 0.1 10*3/uL (ref 0.0–0.1)
Basophils Relative: 1 % (ref 0–1)
Eosinophils Absolute: 0.6 10*3/uL (ref 0.0–0.7)
Eosinophils Relative: 7 % — ABNORMAL HIGH (ref 0–5)
HCT: 38.6 % (ref 36.0–46.0)
Hemoglobin: 12.7 g/dL (ref 12.0–15.0)
Lymphocytes Relative: 54 % — ABNORMAL HIGH (ref 12–46)
Lymphs Abs: 5.2 10*3/uL — ABNORMAL HIGH (ref 0.7–4.0)
MCH: 28.6 pg (ref 26.0–34.0)
MCHC: 32.9 g/dL (ref 30.0–36.0)
MCV: 86.9 fL (ref 78.0–100.0)
Monocytes Absolute: 0.4 10*3/uL (ref 0.1–1.0)
Monocytes Relative: 4 % (ref 3–12)
Neutro Abs: 3.3 10*3/uL (ref 1.7–7.7)
Neutrophils Relative %: 34 % — ABNORMAL LOW (ref 43–77)
Platelets: 245 10*3/uL (ref 150–400)
RBC: 4.44 MIL/uL (ref 3.87–5.11)
RDW: 14.2 % (ref 11.5–15.5)
WBC: 9.5 10*3/uL (ref 4.0–10.5)

## 2015-01-23 LAB — BASIC METABOLIC PANEL
Anion gap: 10 (ref 5–15)
BUN: 14 mg/dL (ref 6–23)
CO2: 21 mmol/L (ref 19–32)
Calcium: 9.2 mg/dL (ref 8.4–10.5)
Chloride: 106 mmol/L (ref 96–112)
Creatinine, Ser: 0.92 mg/dL (ref 0.50–1.10)
GFR calc Af Amer: >90 mL/min (ref >90)
GFR calc non Af Amer: 85 mL/min — ABNORMAL LOW (ref >90)
Glucose, Bld: 94 mg/dL (ref 70–99)
Potassium: 3.9 mmol/L (ref 3.5–5.1)
Sodium: 137 mmol/L (ref 135–145)

## 2015-01-23 MED ORDER — IPRATROPIUM BROMIDE 0.02 % IN SOLN
RESPIRATORY_TRACT | Status: AC
  Filled 2015-01-23: qty 5

## 2015-01-23 MED ORDER — ALBUTEROL (5 MG/ML) CONTINUOUS INHALATION SOLN
INHALATION_SOLUTION | RESPIRATORY_TRACT | Status: AC
  Filled 2015-01-23: qty 20

## 2015-01-23 MED ORDER — ALBUTEROL SULFATE HFA 108 (90 BASE) MCG/ACT IN AERS
6.0000 | INHALATION_SPRAY | Freq: Once | RESPIRATORY_TRACT | Status: AC
  Administered 2015-01-23: 6 via RESPIRATORY_TRACT
  Filled 2015-01-23: qty 6.7

## 2015-01-23 MED ORDER — METHYLPREDNISOLONE SODIUM SUCC 125 MG IJ SOLR
125.0000 mg | Freq: Once | INTRAMUSCULAR | Status: AC
  Administered 2015-01-23: 125 mg via INTRAVENOUS
  Filled 2015-01-23: qty 2

## 2015-01-23 MED ORDER — IPRATROPIUM BROMIDE 0.02 % IN SOLN
0.5000 mg | Freq: Once | RESPIRATORY_TRACT | Status: AC
  Administered 2015-01-23: 1 mg via RESPIRATORY_TRACT

## 2015-01-23 MED ORDER — ALBUTEROL (5 MG/ML) CONTINUOUS INHALATION SOLN
2.5000 mg/h | INHALATION_SOLUTION | Freq: Once | RESPIRATORY_TRACT | Status: AC
  Administered 2015-01-23: 15 mg/h via RESPIRATORY_TRACT

## 2015-01-23 NOTE — ED Notes (Signed)
Pt reports sob since yesterday with nonproductive cough; pt reports prescribed steroids for asthma but cannot afford them
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
## 2015-01-24 NOTE — Discharge Instructions (Signed)

## 2015-03-11 ENCOUNTER — Encounter (HOSPITAL_COMMUNITY): Payer: Self-pay | Admitting: Advanced Practice Midwife

## 2015-03-11 ENCOUNTER — Inpatient Hospital Stay (HOSPITAL_COMMUNITY)
Admission: AD | Admit: 2015-03-11 | Discharge: 2015-03-11 | Disposition: A | Discharge status: Home / Self Care | Payer: Medicare HMO | Source: Ambulatory Visit | Attending: Family Medicine | Admitting: Family Medicine

## 2015-03-11 DIAGNOSIS — R55 Syncope and collapse: Secondary | ICD-10-CM

## 2015-03-11 DIAGNOSIS — Z886 Allergy status to analgesic agent status: Secondary | ICD-10-CM | POA: Diagnosis not present

## 2015-03-11 DIAGNOSIS — R42 Dizziness and giddiness: Secondary | ICD-10-CM | POA: Diagnosis present

## 2015-03-11 DIAGNOSIS — N926 Irregular menstruation, unspecified: Secondary | ICD-10-CM | POA: Insufficient documentation

## 2015-03-11 DIAGNOSIS — F17210 Nicotine dependence, cigarettes, uncomplicated: Secondary | ICD-10-CM | POA: Insufficient documentation

## 2015-03-11 LAB — CBC
HCT: 38.8 % (ref 36.0–46.0)
Hemoglobin: 13.2 g/dL (ref 12.0–15.0)
MCH: 29.7 pg (ref 26.0–34.0)
MCHC: 34 g/dL (ref 30.0–36.0)
MCV: 87.4 fL (ref 78.0–100.0)
Platelets: 282 10*3/uL (ref 150–400)
RBC: 4.44 MIL/uL (ref 3.87–5.11)
RDW: 14.3 % (ref 11.5–15.5)
WBC: 7.5 10*3/uL (ref 4.0–10.5)

## 2015-03-11 LAB — COMPREHENSIVE METABOLIC PANEL
ALT: 25 U/L (ref 14–54)
AST: 22 U/L (ref 15–41)
Albumin: 4.2 g/dL (ref 3.5–5.0)
Alkaline Phosphatase: 63 U/L (ref 38–126)
Anion gap: 3 — ABNORMAL LOW (ref 5–15)
BUN: 14 mg/dL (ref 6–20)
CO2: 25 mmol/L (ref 22–32)
Calcium: 9 mg/dL (ref 8.9–10.3)
Chloride: 109 mmol/L (ref 101–111)
Creatinine, Ser: 0.76 mg/dL (ref 0.44–1.00)
GFR calc Af Amer: >60 mL/min (ref >60)
GFR calc non Af Amer: >60 mL/min (ref >60)
Glucose, Bld: 96 mg/dL (ref 65–99)
Potassium: 3.9 mmol/L (ref 3.5–5.1)
Sodium: 137 mmol/L (ref 135–145)
Total Bilirubin: 0.8 mg/dL (ref 0.3–1.2)
Total Protein: 7.9 g/dL (ref 6.5–8.1)

## 2015-03-11 LAB — WET PREP, GENITAL
Trich, Wet Prep: NONE SEEN
Yeast Wet Prep HPF POC: NONE SEEN

## 2015-03-11 LAB — URINALYSIS, ROUTINE W REFLEX MICROSCOPIC
Bilirubin Urine: NEGATIVE
Glucose, UA: NEGATIVE mg/dL
Ketones, ur: 15 mg/dL — AB
Leukocytes, UA: NEGATIVE
Nitrite: NEGATIVE
Protein, ur: NEGATIVE mg/dL
Specific Gravity, Urine: >1.03 — ABNORMAL HIGH (ref 1.005–1.030)
Urobilinogen, UA: 0.2 mg/dL (ref 0.0–1.0)
pH: 5.5 (ref 5.0–8.0)

## 2015-03-11 LAB — URINE MICROSCOPIC-ADD ON

## 2015-03-11 LAB — POCT PREGNANCY, URINE: Preg Test, Ur: NEGATIVE

## 2015-03-11 NOTE — MAU Note (Signed)
Went to lobby to call patient back to discharge patient and patient is not in lobby.

## 2015-03-11 NOTE — MAU Note (Signed)
Pt did not return to lobby. Provider spoke with patient before patient left. Patient verbalized understanding. Work note not received by patient.
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
## 2015-03-11 NOTE — MAU Note (Signed)
Patient returned, note for work given, vital signs taken, discharge discussed. Pt says she left without notice to go get her daughter.

## 2015-03-11 NOTE — MAU Note (Signed)
Pt presents complaining that her menstrual cycle is a week late and spotting. States she almost passed out at work but did not. Bleeding has stopped since. Denies other abnormal discharge. Has not taken HPT

## 2015-03-11 NOTE — MAU Note (Signed)
Got dizzy, light headed and almost fell at work. Bright pink vaginal bleeding today. Period one week late. Unsure if she is pregnant. Hips hurt really bad.

## 2015-03-11 NOTE — MAU Note (Signed)
Orthostatic Lying BP- Lying 140/77 140/77 at 1635 on 03/11/15 by Barbette Merino, RN Pulse- Lying 68 68 at 1635 on 03/11/15 by Barbette Merino, RN   Orthostatic Sitting BP- Sitting 143/82 143/82 denies dizziness/light head at 1635 on 03/11/15 by Barbette Merino, RN Pulse- Sitting 65 65 at 1635 on 03/11/15 by Barbette Merino, RN   Orthostatic Standing at 0 minutes BP- Standing at 0 minutes 125/86  125/86 c/o light headedness  at 1635 on 03/11/15 by Barbette Merino, RN Pulse- Standing at 0 minutes 79 79 at 1635 on 03/11/15 by Barbette Merino, RN

## 2015-03-11 NOTE — MAU Note (Signed)
Not in lobby when called

## 2015-03-14 LAB — GC/CHLAMYDIA PROBE AMP (~~LOC~~) NOT AT ARMC
Chlamydia: NEGATIVE
Neisseria Gonorrhea: NEGATIVE

## 2015-04-27 ENCOUNTER — Other Ambulatory Visit: Payer: Self-pay | Admitting: Physician Assistant

## 2015-04-27 DIAGNOSIS — N926 Irregular menstruation, unspecified: Secondary | ICD-10-CM

## 2015-04-27 DIAGNOSIS — R102 Pelvic and perineal pain: Secondary | ICD-10-CM

## 2015-05-03 ENCOUNTER — Ambulatory Visit
Admission: RE | Admit: 2015-05-03 | Discharge: 2015-05-03 | Disposition: A | Discharge status: Home / Self Care | Payer: Medicare HMO | Source: Ambulatory Visit | Attending: Physician Assistant | Admitting: Physician Assistant

## 2015-05-03 DIAGNOSIS — R102 Pelvic and perineal pain unspecified side: Secondary | ICD-10-CM

## 2015-05-03 DIAGNOSIS — N926 Irregular menstruation, unspecified: Secondary | ICD-10-CM

## 2015-05-19 ENCOUNTER — Emergency Department (HOSPITAL_COMMUNITY)
Admission: EM | Admit: 2015-05-19 | Discharge: 2015-05-19 | Disposition: A | Discharge status: Home / Self Care | Payer: Medicare HMO | Attending: Emergency Medicine | Admitting: Emergency Medicine

## 2015-05-19 ENCOUNTER — Encounter (HOSPITAL_COMMUNITY): Payer: Self-pay | Admitting: *Deleted

## 2015-05-19 DIAGNOSIS — J45909 Unspecified asthma, uncomplicated: Secondary | ICD-10-CM | POA: Insufficient documentation

## 2015-05-19 DIAGNOSIS — Z7951 Long term (current) use of inhaled steroids: Secondary | ICD-10-CM | POA: Diagnosis not present

## 2015-05-19 DIAGNOSIS — Z79899 Other long term (current) drug therapy: Secondary | ICD-10-CM | POA: Insufficient documentation

## 2015-05-19 DIAGNOSIS — Z720 Tobacco use: Secondary | ICD-10-CM | POA: Insufficient documentation

## 2015-05-19 DIAGNOSIS — Z8659 Personal history of other mental and behavioral disorders: Secondary | ICD-10-CM | POA: Insufficient documentation

## 2015-05-19 DIAGNOSIS — Y9389 Activity, other specified: Secondary | ICD-10-CM | POA: Diagnosis not present

## 2015-05-19 DIAGNOSIS — T43641A Poisoning by ecstasy, accidental (unintentional), initial encounter: Secondary | ICD-10-CM

## 2015-05-19 DIAGNOSIS — E119 Type 2 diabetes mellitus without complications: Secondary | ICD-10-CM | POA: Diagnosis not present

## 2015-05-19 DIAGNOSIS — Y9289 Other specified places as the place of occurrence of the external cause: Secondary | ICD-10-CM | POA: Insufficient documentation

## 2015-05-19 DIAGNOSIS — Y998 Other external cause status: Secondary | ICD-10-CM | POA: Diagnosis not present

## 2015-05-19 DIAGNOSIS — T43621A Poisoning by amphetamines, accidental (unintentional), initial encounter: Secondary | ICD-10-CM | POA: Insufficient documentation

## 2015-05-19 DIAGNOSIS — Z8669 Personal history of other diseases of the nervous system and sense organs: Secondary | ICD-10-CM | POA: Diagnosis not present

## 2015-05-19 LAB — I-STAT CHEM 8, ED
BUN: 12 mg/dL (ref 6–20)
Calcium, Ion: 1.13 mmol/L (ref 1.12–1.23)
Chloride: 103 mmol/L (ref 101–111)
Creatinine, Ser: 0.8 mg/dL (ref 0.44–1.00)
Glucose, Bld: 96 mg/dL (ref 65–99)
HCT: 39 % (ref 36.0–46.0)
Hemoglobin: 13.3 g/dL (ref 12.0–15.0)
Potassium: 3.6 mmol/L (ref 3.5–5.1)
Sodium: 139 mmol/L (ref 135–145)
TCO2: 23 mmol/L (ref 0–100)
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
## 2015-08-22 ENCOUNTER — Encounter (HOSPITAL_COMMUNITY): Payer: Self-pay | Admitting: *Deleted

## 2015-08-22 ENCOUNTER — Emergency Department (INDEPENDENT_AMBULATORY_CARE_PROVIDER_SITE_OTHER)
Admission: EM | Admit: 2015-08-22 | Discharge: 2015-08-22 | Disposition: A | Discharge status: Home / Self Care | Payer: Medicare HMO | Source: Home / Self Care | Attending: Family Medicine | Admitting: Family Medicine

## 2015-08-22 DIAGNOSIS — K529 Noninfective gastroenteritis and colitis, unspecified: Secondary | ICD-10-CM

## 2015-08-22 MED ORDER — ONDANSETRON HCL 4 MG/2ML IJ SOLN
4.0000 mg | Freq: Once | INTRAMUSCULAR | Status: AC
  Administered 2015-08-22: 4 mg via INTRAMUSCULAR

## 2015-08-22 MED ORDER — ONDANSETRON HCL 4 MG/2ML IJ SOLN
INTRAMUSCULAR | Status: AC
  Filled 2015-08-22: qty 2

## 2015-08-22 MED ORDER — ONDANSETRON HCL 4 MG PO TABS: 4.0000 mg | ORAL_TABLET | Freq: Four times a day (QID) | ORAL | Status: DC
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
## 2015-08-22 NOTE — ED Notes (Addendum)
abd  Pain    Pain      With  Vomiting   Onset    Of  Symptoms         Began today          Reports  Headache  As  Well         Pt   Reports          She took  Some  Anti pyretic      meds  Today

## 2015-08-22 NOTE — Discharge Instructions (Signed)
Clear liquid , bland diet tonight as tolerated, advance on tues as improved, use medicine as needed, return or see your doctor if any problems. °

## 2015-09-29 ENCOUNTER — Inpatient Hospital Stay (HOSPITAL_COMMUNITY): Payer: Medicare HMO

## 2015-09-29 ENCOUNTER — Inpatient Hospital Stay (HOSPITAL_COMMUNITY)
Admission: AD | Admit: 2015-09-29 | Discharge: 2015-09-30 | Disposition: A | Discharge status: Home / Self Care | Payer: Medicare HMO | Source: Ambulatory Visit | Attending: Family Medicine | Admitting: Family Medicine

## 2015-09-29 ENCOUNTER — Encounter (HOSPITAL_COMMUNITY): Payer: Self-pay

## 2015-09-29 DIAGNOSIS — O4691 Antepartum hemorrhage, unspecified, first trimester: Secondary | ICD-10-CM | POA: Diagnosis not present

## 2015-09-29 DIAGNOSIS — Z79899 Other long term (current) drug therapy: Secondary | ICD-10-CM | POA: Diagnosis not present

## 2015-09-29 DIAGNOSIS — J45909 Unspecified asthma, uncomplicated: Secondary | ICD-10-CM | POA: Diagnosis not present

## 2015-09-29 DIAGNOSIS — G4733 Obstructive sleep apnea (adult) (pediatric): Secondary | ICD-10-CM | POA: Insufficient documentation

## 2015-09-29 DIAGNOSIS — Z3A00 Weeks of gestation of pregnancy not specified: Secondary | ICD-10-CM | POA: Diagnosis not present

## 2015-09-29 DIAGNOSIS — Z833 Family history of diabetes mellitus: Secondary | ICD-10-CM | POA: Diagnosis not present

## 2015-09-29 DIAGNOSIS — Z87891 Personal history of nicotine dependence: Secondary | ICD-10-CM | POA: Insufficient documentation

## 2015-09-29 DIAGNOSIS — O209 Hemorrhage in early pregnancy, unspecified: Secondary | ICD-10-CM | POA: Insufficient documentation

## 2015-09-29 DIAGNOSIS — O99519 Diseases of the respiratory system complicating pregnancy, unspecified trimester: Secondary | ICD-10-CM | POA: Insufficient documentation

## 2015-09-29 DIAGNOSIS — O24919 Unspecified diabetes mellitus in pregnancy, unspecified trimester: Secondary | ICD-10-CM | POA: Diagnosis not present

## 2015-09-29 DIAGNOSIS — N939 Abnormal uterine and vaginal bleeding, unspecified: Secondary | ICD-10-CM | POA: Diagnosis present

## 2015-09-29 DIAGNOSIS — E119 Type 2 diabetes mellitus without complications: Secondary | ICD-10-CM | POA: Insufficient documentation

## 2015-09-29 DIAGNOSIS — O9989 Other specified diseases and conditions complicating pregnancy, childbirth and the puerperium: Secondary | ICD-10-CM | POA: Insufficient documentation

## 2015-09-29 DIAGNOSIS — O3680X0 Pregnancy with inconclusive fetal viability, not applicable or unspecified: Secondary | ICD-10-CM

## 2015-09-29 HISTORY — DX: Essential (primary) hypertension: I10

## 2015-09-29 LAB — URINALYSIS, ROUTINE W REFLEX MICROSCOPIC
Bilirubin Urine: NEGATIVE
Glucose, UA: NEGATIVE mg/dL
Hgb urine dipstick: NEGATIVE
Ketones, ur: 15 mg/dL — AB
Leukocytes, UA: NEGATIVE
Nitrite: NEGATIVE
Protein, ur: NEGATIVE mg/dL
Specific Gravity, Urine: 1.025 (ref 1.005–1.030)
pH: 6 (ref 5.0–8.0)

## 2015-09-29 LAB — CBC
HCT: 35.9 % — ABNORMAL LOW (ref 36.0–46.0)
Hemoglobin: 11.8 g/dL — ABNORMAL LOW (ref 12.0–15.0)
MCH: 29.3 pg (ref 26.0–34.0)
MCHC: 32.9 g/dL (ref 30.0–36.0)
MCV: 89.1 fL (ref 78.0–100.0)
Platelets: 245 10*3/uL (ref 150–400)
RBC: 4.03 MIL/uL (ref 3.87–5.11)
RDW: 14.4 % (ref 11.5–15.5)
WBC: 8 10*3/uL (ref 4.0–10.5)

## 2015-09-29 LAB — POCT PREGNANCY, URINE: Preg Test, Ur: POSITIVE — AB

## 2015-09-29 LAB — HCG, QUANTITATIVE, PREGNANCY: hCG, Beta Chain, Quant, S: 2137 m[IU]/mL — ABNORMAL HIGH (ref <5)

## 2015-09-29 NOTE — MAU Note (Signed)
Pt states +UPT at home. Has noticed some light vaginal bleeding x3 days after she had intercourse. Denies pain.
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
## 2015-09-30 DIAGNOSIS — O4691 Antepartum hemorrhage, unspecified, first trimester: Secondary | ICD-10-CM | POA: Diagnosis not present

## 2015-09-30 DIAGNOSIS — O209 Hemorrhage in early pregnancy, unspecified: Secondary | ICD-10-CM | POA: Diagnosis not present

## 2015-09-30 LAB — WET PREP, GENITAL
Clue Cells Wet Prep HPF POC: NONE SEEN
Sperm: NONE SEEN
Trich, Wet Prep: NONE SEEN
Yeast Wet Prep HPF POC: NONE SEEN

## 2015-09-30 LAB — HIV ANTIBODY (ROUTINE TESTING W REFLEX): HIV Screen 4th Generation wRfx: NONREACTIVE

## 2015-09-30 LAB — RPR: RPR Ser Ql: NONREACTIVE

## 2015-09-30 LAB — GC/CHLAMYDIA PROBE AMP (~~LOC~~) NOT AT ARMC
Chlamydia: NEGATIVE
Neisseria Gonorrhea: NEGATIVE

## 2015-09-30 LAB — ABO/RH: ABO/RH(D): A POS

## 2015-09-30 NOTE — Discharge Instructions (Signed)

## 2015-10-01 ENCOUNTER — Inpatient Hospital Stay (HOSPITAL_COMMUNITY)
Admission: AD | Admit: 2015-10-01 | Discharge: 2015-10-02 | Disposition: A | Discharge status: Home / Self Care | Payer: Medicare HMO | Source: Ambulatory Visit | Attending: Obstetrics and Gynecology | Admitting: Obstetrics and Gynecology

## 2015-10-01 ENCOUNTER — Encounter (HOSPITAL_COMMUNITY): Payer: Self-pay | Admitting: *Deleted

## 2015-10-01 DIAGNOSIS — O209 Hemorrhage in early pregnancy, unspecified: Secondary | ICD-10-CM | POA: Diagnosis present

## 2015-10-01 DIAGNOSIS — Z3A01 Less than 8 weeks gestation of pregnancy: Secondary | ICD-10-CM | POA: Diagnosis not present

## 2015-10-01 DIAGNOSIS — O0281 Inappropriate change in quantitative human chorionic gonadotropin (hCG) in early pregnancy: Secondary | ICD-10-CM

## 2015-10-01 DIAGNOSIS — O3680X0 Pregnancy with inconclusive fetal viability, not applicable or unspecified: Secondary | ICD-10-CM

## 2015-10-01 LAB — HCG, QUANTITATIVE, PREGNANCY: hCG, Beta Chain, Quant, S: 1957 m[IU]/mL — ABNORMAL HIGH (ref <5)

## 2015-10-01 NOTE — Discharge Instructions (Signed)
Ectopic Pregnancy °An ectopic pregnancy is when the fertilized egg attaches (implants) outside the uterus. Most ectopic pregnancies occur in the fallopian tube. Rarely do ectopic pregnancies occur on the ovary, intestine, pelvis, or cervix. In an ectopic pregnancy, the fertilized egg does not have the ability to develop into a normal, healthy baby.  °A ruptured ectopic pregnancy is one in which the fallopian tube gets torn or bursts and results in internal bleeding. Often there is intense abdominal pain, and sometimes, vaginal bleeding. Having an ectopic pregnancy can be life threatening. If left untreated, this dangerous condition can lead to a blood transfusion, abdominal surgery, or even death. °CAUSES  °Damage to the fallopian tubes is the suspected cause in most ectopic pregnancies.  °RISK FACTORS °Depending on your circumstances, the risk of having an ectopic pregnancy will vary. The level of risk can be divided into three categories. °High Risk °· You have gone through infertility treatment. °· You have had a previous ectopic pregnancy. °· You have had previous tubal surgery. °· You have had previous surgery to have the fallopian tubes tied (tubal ligation). °· You have tubal problems or diseases. °· You have been exposed to DES. DES is a medicine that was used until 1971 and had effects on babies whose mothers took the medicine. °· You become pregnant while using an intrauterine device (IUD) for birth control.  °Moderate Risk °· You have a history of infertility. °· You have a history of a sexually transmitted infection (STI). °· You have a history of pelvic inflammatory disease (PID). °· You have scarring from endometriosis. °· You have multiple sexual partners. °· You smoke.  °Low Risk °· You have had previous pelvic surgery. °· You use vaginal douching. °· You became sexually active before 27 years of age. °SIGNS AND SYMPTOMS  °An ectopic pregnancy should be suspected in anyone who has missed a period and  has abdominal pain or bleeding. °· You may experience normal pregnancy symptoms, such as: °¨ Nausea. °¨ Tiredness. °¨ Breast tenderness. °· Other symptoms may include: °¨ Pain with intercourse. °¨ Irregular vaginal bleeding or spotting. °¨ Cramping or pain on one side or in the lower abdomen. °¨ Fast heartbeat. °¨ Passing out while having a bowel movement. °· Symptoms of a ruptured ectopic pregnancy and internal bleeding may include: °¨ Sudden, severe pain in the abdomen and pelvis. °¨ Dizziness or fainting. °¨ Pain in the shoulder area. °DIAGNOSIS  °Tests that may be performed include: °· A pregnancy test. °· An ultrasound test. °· Testing the specific level of pregnancy hormone in the bloodstream. °· Taking a sample of uterus tissue (dilation and curettage, D&C). °· Surgery to perform a visual exam of the inside of the abdomen using a thin, lighted tube with a tiny camera on the end (laparoscope). °TREATMENT  °An injection of a medicine called methotrexate may be given. This medicine causes the pregnancy tissue to be absorbed. It is given if: °· The diagnosis is made early. °· The fallopian tube has not ruptured. °· You are considered to be a good candidate for the medicine. °Usually, pregnancy hormone blood levels are checked after methotrexate treatment. This is to be sure the medicine is effective. It may take 4-6 weeks for the pregnancy to be absorbed (though most pregnancies will be absorbed by 3 weeks). °Surgical treatment may be needed. A laparoscope may be used to remove the pregnancy tissue. If severe internal bleeding occurs, a cut (incision) may be made in the lower abdomen (laparotomy), and the ectopic   pregnancy is removed. This stops the bleeding. Part of the fallopian tube, or the whole tube, may be removed as well (salpingectomy). After surgery, pregnancy hormone tests may be done to be sure there is no pregnancy tissue left. You may receive a Rho (D) immune globulin shot if you are Rh negative and  the father is Rh positive, or if you do not know the Rh type of the father. This is to prevent problems with any future pregnancy. SEEK IMMEDIATE MEDICAL CARE IF:  You have any symptoms of an ectopic pregnancy. This is a medical emergency. MAKE SURE YOU:  Understand these instructions.  Will watch your condition.  Will get help right away if you are not doing well or get worse.   This information is not intended to replace advice given to you by your health care provider. Make sure you discuss any questions you have with your health care provider.   Document Released: 11/01/2004 Document Revised: 10/15/2014 Document Reviewed: 04/23/2013 Elsevier Interactive Patient Education 2016 [employer] Vaginal Bleeding During Pregnancy, First Trimester A small amount of bleeding (spotting) from the vagina is relatively common in early pregnancy. It usually stops on its own. Various things may cause bleeding or spotting in early pregnancy. Some bleeding may be related to the pregnancy, and some may not. In most cases, the bleeding is normal and is not a problem. However, bleeding can also be a sign of something serious. Be sure to tell your health care provider about any vaginal bleeding right away. Some possible causes of vaginal bleeding during the first trimester include:  Infection or inflammation of the cervix.  Growths (polyps) on the cervix.  Miscarriage or threatened miscarriage.  Pregnancy tissue has developed outside of the uterus and in a fallopian tube (tubal pregnancy).  Tiny cysts have developed in the uterus instead of pregnancy tissue (molar pregnancy). HOME CARE INSTRUCTIONS  Watch your condition for any changes. The following actions may help to lessen any discomfort you are feeling:  Follow your health care provider's instructions for limiting your activity. If your health care provider orders bed rest, you may need to stay in bed and only get up to use the bathroom. However,  your health care provider may allow you to continue light activity.  If needed, make plans for someone to help with your regular activities and responsibilities while you are on bed rest.  Keep track of the number of pads you use each day, how often you change pads, and how soaked (saturated) they are. Write this down.  Do not use tampons. Do not douche.  Do not have sexual intercourse or orgasms until approved by your health care provider.  If you pass any tissue from your vagina, save the tissue so you can show it to your health care provider.  Only take over-the-counter or prescription medicines as directed by your health care provider.  Do not take aspirin because it can make you bleed.  Keep all follow-up appointments as directed by your health care provider. SEEK MEDICAL CARE IF:  You have any vaginal bleeding during any part of your pregnancy.  You have cramps or labor pains.  You have a fever, not controlled by medicine. SEEK IMMEDIATE MEDICAL CARE IF:   You have severe cramps in your back or belly (abdomen).  You pass large clots or tissue from your vagina.  Your bleeding increases.  You feel light-headed or weak, or you have fainting episodes.  You have chills.  You are leaking  fluid or have a gush of fluid from your vagina.  You pass out while having a bowel movement. MAKE SURE YOU:  Understand these instructions.  Will watch your condition.  Will get help right away if you are not doing well or get worse.   This information is not intended to replace advice given to you by your health care provider. Make sure you discuss any questions you have with your health care provider.   Document Released: 07/04/2005 Document Revised: 09/29/2013 Document Reviewed: 06/01/2013 Elsevier Interactive Patient Education [employer].
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
## 2015-10-01 NOTE — MAU Note (Signed)
Here for repeat BHCG. Still having slight pink/red/brown spotting but less than has been. No pain

## 2015-10-02 DIAGNOSIS — O209 Hemorrhage in early pregnancy, unspecified: Secondary | ICD-10-CM | POA: Diagnosis not present

## 2015-10-02 NOTE — MAU Note (Signed)
D Poe CNM talked with pt in Triage regarding test results and d/c plan. Pt to f/u in office on Tues or return to MAU sooner for any concerns

## 2015-10-12 DIAGNOSIS — J45909 Unspecified asthma, uncomplicated: Secondary | ICD-10-CM | POA: Diagnosis not present

## 2015-10-12 DIAGNOSIS — R7301 Impaired fasting glucose: Secondary | ICD-10-CM | POA: Diagnosis not present

## 2015-10-12 DIAGNOSIS — E559 Vitamin D deficiency, unspecified: Secondary | ICD-10-CM | POA: Diagnosis not present

## 2015-10-12 DIAGNOSIS — J069 Acute upper respiratory infection, unspecified: Secondary | ICD-10-CM | POA: Diagnosis not present

## 2015-10-12 DIAGNOSIS — Z720 Tobacco use: Secondary | ICD-10-CM | POA: Diagnosis not present

## 2015-10-12 DIAGNOSIS — I10 Essential (primary) hypertension: Secondary | ICD-10-CM | POA: Diagnosis not present

## 2015-10-12 DIAGNOSIS — E785 Hyperlipidemia, unspecified: Secondary | ICD-10-CM | POA: Diagnosis not present

## 2015-10-12 DIAGNOSIS — J309 Allergic rhinitis, unspecified: Secondary | ICD-10-CM | POA: Diagnosis not present

## 2015-10-20 DIAGNOSIS — O021 Missed abortion: Secondary | ICD-10-CM | POA: Diagnosis not present

## 2015-10-20 DIAGNOSIS — O039 Complete or unspecified spontaneous abortion without complication: Secondary | ICD-10-CM | POA: Diagnosis not present

## 2015-10-20 DIAGNOSIS — O200 Threatened abortion: Secondary | ICD-10-CM | POA: Diagnosis not present

## 2015-10-27 DIAGNOSIS — R0602 Shortness of breath: Secondary | ICD-10-CM | POA: Diagnosis not present

## 2015-10-27 DIAGNOSIS — I10 Essential (primary) hypertension: Secondary | ICD-10-CM | POA: Diagnosis not present

## 2015-10-27 DIAGNOSIS — Z720 Tobacco use: Secondary | ICD-10-CM | POA: Diagnosis not present

## 2015-10-27 DIAGNOSIS — E559 Vitamin D deficiency, unspecified: Secondary | ICD-10-CM | POA: Diagnosis not present

## 2015-10-27 DIAGNOSIS — R7301 Impaired fasting glucose: Secondary | ICD-10-CM | POA: Diagnosis not present

## 2015-10-27 DIAGNOSIS — N760 Acute vaginitis: Secondary | ICD-10-CM | POA: Diagnosis not present

## 2015-10-27 DIAGNOSIS — J45909 Unspecified asthma, uncomplicated: Secondary | ICD-10-CM | POA: Diagnosis not present

## 2015-10-27 DIAGNOSIS — E785 Hyperlipidemia, unspecified: Secondary | ICD-10-CM | POA: Diagnosis not present

## 2015-10-27 DIAGNOSIS — J309 Allergic rhinitis, unspecified: Secondary | ICD-10-CM | POA: Diagnosis not present

## 2015-10-28 ENCOUNTER — Encounter (HOSPITAL_COMMUNITY): Payer: Self-pay | Admitting: *Deleted

## 2015-10-28 ENCOUNTER — Inpatient Hospital Stay (HOSPITAL_COMMUNITY)
Admission: AD | Admit: 2015-10-28 | Discharge: 2015-10-28 | Disposition: A | Discharge status: Home / Self Care | Payer: Medicare HMO | Source: Ambulatory Visit | Attending: Obstetrics and Gynecology | Admitting: Obstetrics and Gynecology

## 2015-10-28 DIAGNOSIS — E119 Type 2 diabetes mellitus without complications: Secondary | ICD-10-CM | POA: Diagnosis not present

## 2015-10-28 DIAGNOSIS — N898 Other specified noninflammatory disorders of vagina: Secondary | ICD-10-CM | POA: Insufficient documentation

## 2015-10-28 DIAGNOSIS — Z87891 Personal history of nicotine dependence: Secondary | ICD-10-CM | POA: Diagnosis not present

## 2015-10-28 DIAGNOSIS — J45909 Unspecified asthma, uncomplicated: Secondary | ICD-10-CM | POA: Diagnosis not present

## 2015-10-28 DIAGNOSIS — F319 Bipolar disorder, unspecified: Secondary | ICD-10-CM | POA: Insufficient documentation

## 2015-10-28 DIAGNOSIS — I10 Essential (primary) hypertension: Secondary | ICD-10-CM | POA: Insufficient documentation

## 2015-10-28 DIAGNOSIS — G4733 Obstructive sleep apnea (adult) (pediatric): Secondary | ICD-10-CM | POA: Diagnosis not present

## 2015-10-28 DIAGNOSIS — O039 Complete or unspecified spontaneous abortion without complication: Secondary | ICD-10-CM | POA: Diagnosis not present

## 2015-10-28 LAB — CBC WITH DIFFERENTIAL/PLATELET
Basophils Absolute: 0 10*3/uL (ref 0.0–0.1)
Basophils Relative: 0 %
Eosinophils Absolute: 0.2 10*3/uL (ref 0.0–0.7)
Eosinophils Relative: 2 %
HCT: 37 % (ref 36.0–46.0)
Hemoglobin: 12.2 g/dL (ref 12.0–15.0)
Lymphocytes Relative: 48 %
Lymphs Abs: 4.4 10*3/uL — ABNORMAL HIGH (ref 0.7–4.0)
MCH: 29.3 pg (ref 26.0–34.0)
MCHC: 33 g/dL (ref 30.0–36.0)
MCV: 88.7 fL (ref 78.0–100.0)
Monocytes Absolute: 0.3 10*3/uL (ref 0.1–1.0)
Monocytes Relative: 3 %
Neutro Abs: 4.3 10*3/uL (ref 1.7–7.7)
Neutrophils Relative %: 47 %
Platelets: 272 10*3/uL (ref 150–400)
RBC: 4.17 MIL/uL (ref 3.87–5.11)
RDW: 14.7 % (ref 11.5–15.5)
WBC: 9.2 10*3/uL (ref 4.0–10.5)

## 2015-10-28 LAB — HCG, QUANTITATIVE, PREGNANCY: hCG, Beta Chain, Quant, S: 4 m[IU]/mL (ref <5)
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
## 2015-10-28 NOTE — MAU Note (Signed)
Pt presents to MAU with complaints of vaginal bleeding. PT states that she is being followed at [place] for quants. She states she was given cytotec for a miscarriage at the beginning of the month.

## 2015-10-28 NOTE — Discharge Instructions (Signed)
Miscarriage A miscarriage is the loss of an unborn baby (fetus) before the 20th week of pregnancy. The cause is often unknown.  HOME CARE  You may need to stay in bed (bed rest), or you may be able to do light activity. Go about activity as told by your doctor.  Have help at home.  Write down how many pads you use each day. Write down how soaked they are.  Do not use tampons. Do not wash out your vagina (douche) or have sex (intercourse) until your doctor approves.  Only take medicine as told by your doctor.  Do not take aspirin.  Keep all doctor visits as told.  If you or your partner have problems with grieving, talk to your doctor. You can also try counseling. Give yourself time to grieve before trying to get pregnant again. GET HELP RIGHT AWAY IF:  You have bad cramps or pain in your back or belly (abdomen).  You have a fever.  You pass large clumps of blood (clots) from your vagina that are walnut-sized or larger. Save the clumps for your doctor to see.  You pass large amounts of tissue from your vagina. Save the tissue for your doctor to see.  You have more bleeding.  You have thick, bad-smelling fluid (discharge) coming from the vagina.  You get lightheaded, weak, or you pass out (faint).  You have chills. MAKE SURE YOU:  Understand these instructions.  Will watch your condition.  Will get help right away if you are not doing well or get worse.   This information is not intended to replace advice given to you by your health care provider. Make sure you discuss any questions you have with your health care provider.   Document Released: 12/17/2011 Document Reviewed: 12/17/2011 Elsevier Interactive Patient Education [employer].

## 2015-11-18 DIAGNOSIS — I10 Essential (primary) hypertension: Secondary | ICD-10-CM | POA: Diagnosis not present

## 2015-11-18 DIAGNOSIS — I517 Cardiomegaly: Secondary | ICD-10-CM | POA: Diagnosis not present

## 2015-11-30 DIAGNOSIS — Z720 Tobacco use: Secondary | ICD-10-CM | POA: Diagnosis not present

## 2015-11-30 DIAGNOSIS — J45909 Unspecified asthma, uncomplicated: Secondary | ICD-10-CM | POA: Diagnosis not present

## 2015-11-30 DIAGNOSIS — I10 Essential (primary) hypertension: Secondary | ICD-10-CM | POA: Diagnosis not present

## 2015-11-30 DIAGNOSIS — N63 Unspecified lump in breast: Secondary | ICD-10-CM | POA: Diagnosis not present

## 2015-11-30 DIAGNOSIS — E559 Vitamin D deficiency, unspecified: Secondary | ICD-10-CM | POA: Diagnosis not present

## 2015-11-30 DIAGNOSIS — E785 Hyperlipidemia, unspecified: Secondary | ICD-10-CM | POA: Diagnosis not present

## 2015-11-30 DIAGNOSIS — N926 Irregular menstruation, unspecified: Secondary | ICD-10-CM | POA: Diagnosis not present

## 2015-11-30 DIAGNOSIS — J309 Allergic rhinitis, unspecified: Secondary | ICD-10-CM | POA: Diagnosis not present

## 2015-12-01 ENCOUNTER — Other Ambulatory Visit: Payer: Self-pay | Admitting: Physician Assistant

## 2015-12-01 DIAGNOSIS — N6320 Unspecified lump in the left breast, unspecified quadrant: Secondary | ICD-10-CM

## 2015-12-02 ENCOUNTER — Ambulatory Visit
Admission: RE | Admit: 2015-12-02 | Discharge: 2015-12-02 | Disposition: A | Discharge status: Home / Self Care | Payer: Medicare HMO | Source: Ambulatory Visit | Attending: Physician Assistant | Admitting: Physician Assistant

## 2015-12-02 DIAGNOSIS — N6489 Other specified disorders of breast: Secondary | ICD-10-CM | POA: Diagnosis not present

## 2015-12-02 DIAGNOSIS — N6320 Unspecified lump in the left breast, unspecified quadrant: Secondary | ICD-10-CM

## 2015-12-09 ENCOUNTER — Encounter (HOSPITAL_COMMUNITY): Payer: Self-pay

## 2015-12-14 DIAGNOSIS — I10 Essential (primary) hypertension: Secondary | ICD-10-CM | POA: Diagnosis not present

## 2015-12-14 DIAGNOSIS — N63 Unspecified lump in breast: Secondary | ICD-10-CM | POA: Diagnosis not present

## 2015-12-14 DIAGNOSIS — E559 Vitamin D deficiency, unspecified: Secondary | ICD-10-CM | POA: Diagnosis not present

## 2015-12-14 DIAGNOSIS — J309 Allergic rhinitis, unspecified: Secondary | ICD-10-CM | POA: Diagnosis not present

## 2015-12-14 DIAGNOSIS — N926 Irregular menstruation, unspecified: Secondary | ICD-10-CM | POA: Diagnosis not present

## 2015-12-14 DIAGNOSIS — N760 Acute vaginitis: Secondary | ICD-10-CM | POA: Diagnosis not present

## 2015-12-14 DIAGNOSIS — E785 Hyperlipidemia, unspecified: Secondary | ICD-10-CM | POA: Diagnosis not present

## 2015-12-14 DIAGNOSIS — J45909 Unspecified asthma, uncomplicated: Secondary | ICD-10-CM | POA: Diagnosis not present

## 2016-01-18 DIAGNOSIS — N939 Abnormal uterine and vaginal bleeding, unspecified: Secondary | ICD-10-CM | POA: Diagnosis not present

## 2016-02-09 DIAGNOSIS — Z0000 Encounter for general adult medical examination without abnormal findings: Secondary | ICD-10-CM | POA: Diagnosis not present

## 2016-02-09 DIAGNOSIS — I10 Essential (primary) hypertension: Secondary | ICD-10-CM | POA: Diagnosis not present

## 2016-02-09 DIAGNOSIS — Z131 Encounter for screening for diabetes mellitus: Secondary | ICD-10-CM | POA: Diagnosis not present

## 2016-02-09 DIAGNOSIS — Z01118 Encounter for examination of ears and hearing with other abnormal findings: Secondary | ICD-10-CM | POA: Diagnosis not present

## 2016-02-09 DIAGNOSIS — Z8669 Personal history of other diseases of the nervous system and sense organs: Secondary | ICD-10-CM | POA: Diagnosis not present

## 2016-02-09 DIAGNOSIS — E785 Hyperlipidemia, unspecified: Secondary | ICD-10-CM | POA: Diagnosis not present

## 2016-02-09 DIAGNOSIS — Z136 Encounter for screening for cardiovascular disorders: Secondary | ICD-10-CM | POA: Diagnosis not present

## 2016-02-09 DIAGNOSIS — H538 Other visual disturbances: Secondary | ICD-10-CM | POA: Diagnosis not present

## 2016-02-09 DIAGNOSIS — R7303 Prediabetes: Secondary | ICD-10-CM | POA: Diagnosis not present

## 2016-02-09 DIAGNOSIS — J45909 Unspecified asthma, uncomplicated: Secondary | ICD-10-CM | POA: Diagnosis not present

## 2016-02-09 DIAGNOSIS — Z113 Encounter for screening for infections with a predominantly sexual mode of transmission: Secondary | ICD-10-CM | POA: Diagnosis not present

## 2016-02-09 DIAGNOSIS — Z5181 Encounter for therapeutic drug level monitoring: Secondary | ICD-10-CM | POA: Diagnosis not present

## 2016-02-10 ENCOUNTER — Encounter (HOSPITAL_COMMUNITY): Payer: Self-pay

## 2016-02-18 ENCOUNTER — Ambulatory Visit (HOSPITAL_COMMUNITY)
Admission: EM | Admit: 2016-02-18 | Discharge: 2016-02-18 | Disposition: A | Discharge status: Home / Self Care | Payer: Commercial Managed Care - HMO | Attending: Emergency Medicine | Admitting: Emergency Medicine

## 2016-02-18 ENCOUNTER — Encounter (HOSPITAL_COMMUNITY): Payer: Self-pay | Admitting: Emergency Medicine

## 2016-02-18 DIAGNOSIS — L03213 Periorbital cellulitis: Secondary | ICD-10-CM

## 2016-02-18 DIAGNOSIS — H578 Other specified disorders of eye and adnexa: Secondary | ICD-10-CM

## 2016-02-18 DIAGNOSIS — H5789 Other specified disorders of eye and adnexa: Secondary | ICD-10-CM

## 2016-02-18 MED ORDER — FLUORESCEIN SODIUM 1 MG OP STRP
ORAL_STRIP | OPHTHALMIC | Status: AC
  Filled 2016-02-18: qty 1

## 2016-02-18 MED ORDER — TETRACAINE HCL 0.5 % OP SOLN
OPHTHALMIC | Status: AC
  Filled 2016-02-18: qty 2

## 2016-02-18 MED ORDER — CEPHALEXIN 500 MG PO CAPS: 500.0000 mg | ORAL_CAPSULE | Freq: Four times a day (QID) | ORAL | Status: DC

## 2016-02-18 MED ORDER — EYE WASH OPHTH SOLN
OPHTHALMIC | Status: AC
  Filled 2016-02-18: qty 118

## 2016-02-18 MED ORDER — POLYMYXIN B-TRIMETHOPRIM 10000-0.1 UNIT/ML-% OP SOLN: 1.0000 [drp] | OPHTHALMIC | Status: DC

## 2016-02-18 NOTE — ED Notes (Signed)
Reports swelling below left eye.  Patient thinks something flew into her left eye last night.  Continues to feel like something is in eye.

## 2016-02-18 NOTE — Discharge Instructions (Signed)
Cellulitis Cellulitis is an infection of the skin and the tissue beneath it. The infected area is usually red and tender. Cellulitis occurs most often in the arms and lower legs.  CAUSES  Cellulitis is caused by bacteria that enter the skin through cracks or cuts in the skin. The most common types of bacteria that cause cellulitis are staphylococci and streptococci. SIGNS AND SYMPTOMS   Redness and warmth.  Swelling.  Tenderness or pain.  Fever. DIAGNOSIS  Your health care provider can usually determine what is wrong based on a physical exam. Blood tests may also be done. TREATMENT  Treatment usually involves taking an antibiotic medicine. HOME CARE INSTRUCTIONS   Take your antibiotic medicine as directed by your health care provider. Finish the antibiotic even if you start to feel better.  Keep the infected arm or leg elevated to reduce swelling.  Apply a warm cloth to the affected area up to 4 times per day to relieve pain.  Take medicines only as directed by your health care provider.  Keep all follow-up visits as directed by your health care provider. SEEK MEDICAL CARE IF:   You notice red streaks coming from the infected area.  Your red area gets larger or turns dark in color.  Your bone or joint underneath the infected area becomes painful after the skin has healed.  Your infection returns in the same area or another area.  You notice a swollen bump in the infected area.  You develop new symptoms.  You have a fever. SEEK IMMEDIATE MEDICAL CARE IF:   You feel very sleepy.  You develop vomiting or diarrhea.  You have a general ill feeling (malaise) with muscle aches and pains.   This information is not intended to replace advice given to you by your health care provider. Make sure you discuss any questions you have with your health care provider.   Document Released: 07/04/2005 Document Revised: 06/15/2015 Document Reviewed: 12/10/2011 Elsevier Interactive  Patient Education 2016 Elsevier Inc. Bacterial Conjunctivitis Bacterial conjunctivitis, commonly called pink eye, is an inflammation of the clear membrane that covers the white part of the eye (conjunctiva). The inflammation can also happen on the underside of the eyelids. The blood vessels in the conjunctiva become inflamed, causing the eye to become red or pink. Bacterial conjunctivitis may spread easily from one eye to another and from person to person (contagious).  CAUSES  Bacterial conjunctivitis is caused by bacteria. The bacteria may come from your own skin, your upper respiratory tract, or from someone else with bacterial conjunctivitis. SYMPTOMS  The normally white color of the eye or the underside of the eyelid is usually pink or red. The pink eye is usually associated with irritation, tearing, and some sensitivity to light. Bacterial conjunctivitis is often associated with a thick, yellowish discharge from the eye. The discharge may turn into a crust on the eyelids overnight, which causes your eyelids to stick together. If a discharge is present, there may also be some blurred vision in the affected eye. DIAGNOSIS  Bacterial conjunctivitis is diagnosed by your caregiver through an eye exam and the symptoms that you report. Your caregiver looks for changes in the surface tissues of your eyes, which may point to the specific type of conjunctivitis. A sample of any discharge may be collected on a cotton-tip swab if you have a severe case of conjunctivitis, if your cornea is affected, or if you keep getting repeat infections that do not respond to treatment. The sample will be  sent to a lab to see if the inflammation is caused by a bacterial infection and to see if the infection will respond to antibiotic medicines. TREATMENT   Bacterial conjunctivitis is treated with antibiotics. Antibiotic eyedrops are most often used. However, antibiotic ointments are also available. Antibiotics pills are  sometimes used. Artificial tears or eye washes may ease discomfort. HOME CARE INSTRUCTIONS   To ease discomfort, apply a cool, clean washcloth to your eye for 10-20 minutes, 3-4 times a day.  Gently wipe away any drainage from your eye with a warm, wet washcloth or a cotton ball.  Wash your hands often with soap and water. Use paper towels to dry your hands.  Do not share towels or washcloths. This may spread the infection.  Change or wash your pillowcase every day.  You should not use eye makeup until the infection is gone.  Do not operate machinery or drive if your vision is blurred.  Stop using contact lenses. Ask your caregiver how to sterilize or replace your contacts before using them again. This depends on the type of contact lenses that you use.  When applying medicine to the infected eye, do not touch the edge of your eyelid with the eyedrop bottle or ointment tube. SEEK IMMEDIATE MEDICAL CARE IF:   Your infection has not improved within 3 days after beginning treatment.  You had yellow discharge from your eye and it returns.  You have increased eye pain.  Your eye redness is spreading.  Your vision becomes blurred.  You have a fever or persistent symptoms for more than 2-3 days.  You have a fever and your symptoms suddenly get worse.  You have facial pain, redness, or swelling. MAKE SURE YOU:   Understand these instructions.  Will watch your condition.  Will get help right away if you are not doing well or get worse.   This information is not intended to replace advice given to you by your health care provider. Make sure you discuss any questions you have with your health care provider.   Document Released: 09/24/2005 Document Revised: 10/15/2014 Document Reviewed: 02/25/2012 Elsevier Interactive Patient Education [employer].
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
## 2016-04-06 DIAGNOSIS — Z6841 Body Mass Index (BMI) 40.0 and over, adult: Secondary | ICD-10-CM | POA: Diagnosis not present

## 2016-04-06 DIAGNOSIS — Z8249 Family history of ischemic heart disease and other diseases of the circulatory system: Secondary | ICD-10-CM | POA: Diagnosis not present

## 2016-04-06 DIAGNOSIS — R002 Palpitations: Secondary | ICD-10-CM | POA: Diagnosis not present

## 2016-04-06 DIAGNOSIS — F319 Bipolar disorder, unspecified: Secondary | ICD-10-CM | POA: Diagnosis not present

## 2016-04-06 DIAGNOSIS — Z87891 Personal history of nicotine dependence: Secondary | ICD-10-CM | POA: Diagnosis not present

## 2016-04-06 DIAGNOSIS — J45909 Unspecified asthma, uncomplicated: Secondary | ICD-10-CM | POA: Diagnosis not present

## 2016-04-24 ENCOUNTER — Encounter (HOSPITAL_COMMUNITY): Payer: Self-pay | Admitting: *Deleted

## 2016-04-24 ENCOUNTER — Ambulatory Visit (HOSPITAL_COMMUNITY)
Admission: EM | Admit: 2016-04-24 | Discharge: 2016-04-24 | Disposition: A | Discharge status: Home / Self Care | Payer: Commercial Managed Care - HMO | Attending: Family Medicine | Admitting: Family Medicine

## 2016-04-24 DIAGNOSIS — M7052 Other bursitis of knee, left knee: Secondary | ICD-10-CM

## 2016-04-24 MED ORDER — KETOROLAC TROMETHAMINE 60 MG/2ML IM SOLN
60.0000 mg | Freq: Once | INTRAMUSCULAR | Status: AC
  Administered 2016-04-24: 60 mg via INTRAMUSCULAR

## 2016-04-24 MED ORDER — NAPROXEN 500 MG PO TABS: 500.0000 mg | ORAL_TABLET | Freq: Two times a day (BID) | ORAL | Status: DC

## 2016-04-24 MED ORDER — KETOROLAC TROMETHAMINE 60 MG/2ML IM SOLN
INTRAMUSCULAR | Status: AC
  Filled 2016-04-24: qty 2

## 2016-04-24 NOTE — ED Notes (Signed)
Pt       Reports       She  Was   Hiking  [place]  [place]il   [place]  Days  Ago  And  Later  On  After  The      Hiking   She  Developed  Popping  And  Pain in the  Affected  Knee

## 2016-04-24 NOTE — Discharge Instructions (Signed)
Bursitis Bursitis is inflammation and irritation of a bursa, which is one of the small, fluid-filled sacs that cushion and protect the moving parts of your body. These sacs are located between bones and muscles, muscle attachments, or skin areas next to bones. A bursa protects these structures from the wear and tear that results from frequent movement. An inflamed bursa causes pain and swelling. Fluid may build up inside the sac. Bursitis is most common near joints, especially the knees, elbows, hips, and shoulders. CAUSES Bursitis can be caused by:   Injury from:  A direct blow, like falling on your knee or elbow.  Overuse of a joint (repetitive stress).  Infection. This can happen if bacteria gets into a bursa through a cut or scrape near a joint.  Diseases that cause joint inflammation, such as gout and rheumatoid arthritis. RISK FACTORS You may be at risk for bursitis if you:   Have a job or hobby that involves a lot of repetitive stress on your joints.  Have a condition that weakens your body's defense system (immune system), such as diabetes, cancer, or HIV.  Lift and reach overhead often.  Kneel or lean on hard surfaces often.  Run or walk often. SIGNS AND SYMPTOMS The most common signs and symptoms of bursitis are:  Pain that gets worse when you move the affected body part or put weight on it.  Inflammation.  Stiffness. Other signs and symptoms may include:  Redness.  Tenderness.  Warmth.  Pain that continues after rest.  Fever and chills. This may occur in bursitis caused by infection. DIAGNOSIS Bursitis may be diagnosed by:   Medical history and physical exam.  MRI.  A procedure to drain fluid from the bursa with a needle (aspiration). The fluid may be checked for signs of infection or gout.  Blood tests to rule out other causes of inflammation. TREATMENT  Bursitis can usually be treated at home with rest, ice, compression, and elevation (RICE). For  mild bursitis, RICE treatment may be all you need. Other treatments may include:  Nonsteroidal anti-inflammatory drugs (NSAIDs) to treat pain and inflammation.  Corticosteroids to fight inflammation. You may have these drugs injected into and around the area of bursitis.  Aspiration of bursitis fluid to relieve pain and improve movement.  Antibiotic medicine to treat an infected bursa.  A splint, brace, or walking aid.  Physical therapy if you continue to have pain or limited movement.  Surgery to remove a damaged or infected bursa. This may be needed if you have a very bad case of bursitis or if other treatments have not worked. HOME CARE INSTRUCTIONS   Take medicines only as directed by your health care provider.  If you were prescribed an antibiotic medicine, finish it all even if you start to feel better.  Rest the affected area as directed by your health care provider.  Keep the area elevated.  Avoid activities that make pain worse.  Apply ice to the injured area:  Place ice in a plastic bag.  Place a towel between your skin and the bag.  Leave the ice on for 20 minutes, 2-3 times a day.  Use splints, braces, pads, or walking aids as directed by your health care provider.  Keep all follow-up visits as directed by your health care provider. This is important. PREVENTION   Wear knee pads if you kneel often.  Wear sturdy running or walking shoes that fit you well.  Take regular breaks from repetitive activity.  Warm  up by stretching before doing any strenuous activity.  Maintain a healthy weight or lose weight as recommended by your health care provider. Ask your health care provider if you need help.  Exercise regularly. Start any new physical activity gradually. SEEK MEDICAL CARE IF:   Your bursitis is not responding to treatment or home care.  You have a fever.  You have chills.   This information is not intended to replace advice given to you by your  health care provider. Make sure you discuss any questions you have with your health care provider.   Document Released: 09/21/2000 Document Revised: 06/15/2015 Document Reviewed: 12/14/2013 Elsevier Interactive Patient Education 2016 [employer]  Generic Knee Exercises EXERCISES RANGE OF MOTION (ROM) AND STRETCHING EXERCISES These exercises may help you when beginning to rehabilitate your injury. Your symptoms may resolve with or without further involvement from your physician, physical therapist, or athletic trainer. While completing these exercises, remember:   Restoring tissue flexibility helps normal motion to return to the joints. This allows healthier, less painful movement and activity.  An effective stretch should be held for at least 30 seconds.  A stretch should never be painful. You should only feel a gentle lengthening or release in the stretched tissue. STRETCH - Knee Extension, Prone  Lie on your stomach on a firm surface, such as a bed or countertop. Place your right / left knee and leg just beyond the edge of the surface. You may wish to place a towel under the far end of your right / left thigh for comfort.  Relax your leg muscles and allow gravity to straighten your knee. Your clinician may advise you to add an ankle weight if more resistance is helpful for you.  You should feel a stretch in the back of your right / left knee. Hold this position for __________ seconds. Repeat __________ times. Complete this stretch __________ times per day. * Your physician, physical therapist, or athletic trainer may ask you to add ankle weight to enhance your stretch.  RANGE OF MOTION - Knee Flexion, Active  Lie on your back with both knees straight. (If this causes back discomfort, bend your opposite knee, placing your foot flat on the floor.)  Slowly slide your heel back toward your buttocks until you feel a gentle stretch in the front of your knee or thigh.  Hold for __________  seconds. Slowly slide your heel back to the starting position. Repeat __________ times. Complete this exercise __________ times per day.  STRETCH - Quadriceps, Prone   Lie on your stomach on a firm surface, such as a bed or padded floor.  Bend your right / left knee and grasp your ankle. If you are unable to reach your ankle or pant leg, use a belt around your foot to lengthen your reach.  Gently pull your heel toward your buttocks. Your knee should not slide out to the side. You should feel a stretch in the front of your thigh and/or knee.  Hold this position for __________ seconds. Repeat __________ times. Complete this stretch __________ times per day.  STRETCH - Hamstrings, Supine   Lie on your back. Loop a belt or towel over the ball of your right / left foot.  Straighten your right / left knee and slowly pull on the belt to raise your leg. Do not allow the right / left knee to bend. Keep your opposite leg flat on the floor.  Raise the leg until you feel a gentle stretch behind  your right / left knee or thigh. Hold this position for __________ seconds. Repeat __________ times. Complete this stretch __________ times per day.  STRENGTHENING EXERCISES These exercises may help you when beginning to rehabilitate your injury. They may resolve your symptoms with or without further involvement from your physician, physical therapist, or athletic trainer. While completing these exercises, remember:   Muscles can gain both the endurance and the strength needed for everyday activities through controlled exercises.  Complete these exercises as instructed by your physician, physical therapist, or athletic trainer. Progress the resistance and repetitions only as guided.  You may experience muscle soreness or fatigue, but the pain or discomfort you are trying to eliminate should never worsen during these exercises. If this pain does worsen, stop and make certain you are following the directions  exactly. If the pain is still present after adjustments, discontinue the exercise until you can discuss the trouble with your clinician. STRENGTH - Quadriceps, Isometrics  Lie on your back with your right / left leg extended and your opposite knee bent.  Gradually tense the muscles in the front of your right / left thigh. You should see either your knee cap slide up toward your hip or increased dimpling just above the knee. This motion will push the back of the knee down toward the floor/mat/bed on which you are lying.  Hold the muscle as tight as you can without increasing your pain for __________ seconds.  Relax the muscles slowly and completely in between each repetition. Repeat __________ times. Complete this exercise __________ times per day.  STRENGTH - Quadriceps, Short Arcs   Lie on your back. Place a __________ inch towel roll under your knee so that the knee slightly bends.  Raise only your lower leg by tightening the muscles in the front of your thigh. Do not allow your thigh to rise.  Hold this position for __________ seconds. Repeat __________ times. Complete this exercise __________ times per day.  OPTIONAL ANKLE WEIGHTS: Begin with ____________________, but DO NOT exceed ____________________. Increase in 1 pound/0.5 kilogram increments.  STRENGTH - Quadriceps, Straight Leg Raises  Quality counts! Watch for signs that the quadriceps muscle is working to insure you are strengthening the correct muscles and not "cheating" by substituting with healthier muscles.  Lay on your back with your right / left leg extended and your opposite knee bent.  Tense the muscles in the front of your right / left thigh. You should see either your knee cap slide up or increased dimpling just above the knee. Your thigh may even quiver.  Tighten these muscles even more and raise your leg 4 to 6 inches off the floor. Hold for __________ seconds.  Keeping these muscles tense, lower your  leg.  Relax the muscles slowly and completely in between each repetition. Repeat __________ times. Complete this exercise __________ times per day.  STRENGTH - Hamstring, Curls  Lay on your stomach with your legs extended. (If you lay on a bed, your feet may hang over the edge.)  Tighten the muscles in the back of your thigh to bend your right / left knee up to 90 degrees. Keep your hips flat on the bed/floor.  Hold this position for __________ seconds.  Slowly lower your leg back to the starting position. Repeat __________ times. Complete this exercise __________ times per day.  OPTIONAL ANKLE WEIGHTS: Begin with ____________________, but DO NOT exceed ____________________. Increase in 1 pound/0.5 kilogram increments.  STRENGTH - Quadriceps, Squats  Stand in a  door frame so that your feet and knees are in line with the frame.  Use your hands for balance, not support, on the frame.  Slowly lower your weight, bending at the hips and knees. Keep your lower legs upright so that they are parallel with the door frame. Squat only within the range that does not increase your knee pain. Never let your hips drop below your knees.  Slowly return upright, pushing with your legs, not pulling with your hands. Repeat __________ times. Complete this exercise __________ times per day.  STRENGTH - Quadriceps, Wall Slides  Follow guidelines for form closely. Increased knee pain often results from poorly placed feet or knees.  Lean against a smooth wall or door and walk your feet out 18-24 inches. Place your feet hip-width apart.  Slowly slide down the wall or door until your knees bend __________ degrees.* Keep your knees over your heels, not your toes, and in line with your hips, not falling to either side.  Hold for __________ seconds. Stand up to rest for __________ seconds in between each repetition. Repeat __________ times. Complete this exercise __________ times per day. * Your physician,  physical therapist, or athletic trainer will alter this angle based on your symptoms and progress.   This information is not intended to replace advice given to you by your health care provider. Make sure you discuss any questions you have with your health care provider.   Document Released: 08/08/2005 Document Revised: 10/15/2014 Document Reviewed: 01/06/2009 Elsevier Interactive Patient Education [employer].
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
## 2016-04-30 DIAGNOSIS — E785 Hyperlipidemia, unspecified: Secondary | ICD-10-CM | POA: Diagnosis not present

## 2016-04-30 DIAGNOSIS — R7303 Prediabetes: Secondary | ICD-10-CM | POA: Diagnosis not present

## 2016-04-30 DIAGNOSIS — E559 Vitamin D deficiency, unspecified: Secondary | ICD-10-CM | POA: Diagnosis not present

## 2016-04-30 DIAGNOSIS — I10 Essential (primary) hypertension: Secondary | ICD-10-CM | POA: Diagnosis not present

## 2016-04-30 DIAGNOSIS — J45909 Unspecified asthma, uncomplicated: Secondary | ICD-10-CM | POA: Diagnosis not present

## 2016-04-30 DIAGNOSIS — R609 Edema, unspecified: Secondary | ICD-10-CM | POA: Diagnosis not present

## 2016-04-30 DIAGNOSIS — M25562 Pain in left knee: Secondary | ICD-10-CM | POA: Diagnosis not present

## 2016-04-30 DIAGNOSIS — Z720 Tobacco use: Secondary | ICD-10-CM | POA: Diagnosis not present

## 2016-05-03 DIAGNOSIS — M25562 Pain in left knee: Secondary | ICD-10-CM | POA: Diagnosis not present

## 2016-05-09 DIAGNOSIS — L292 Pruritus vulvae: Secondary | ICD-10-CM | POA: Diagnosis not present

## 2016-05-09 DIAGNOSIS — Z113 Encounter for screening for infections with a predominantly sexual mode of transmission: Secondary | ICD-10-CM | POA: Diagnosis not present

## 2016-07-05 DIAGNOSIS — Z113 Encounter for screening for infections with a predominantly sexual mode of transmission: Secondary | ICD-10-CM | POA: Diagnosis not present

## 2016-07-05 DIAGNOSIS — E559 Vitamin D deficiency, unspecified: Secondary | ICD-10-CM | POA: Diagnosis not present

## 2016-07-05 DIAGNOSIS — R609 Edema, unspecified: Secondary | ICD-10-CM | POA: Diagnosis not present

## 2016-07-05 DIAGNOSIS — Z720 Tobacco use: Secondary | ICD-10-CM | POA: Diagnosis not present

## 2016-07-05 DIAGNOSIS — R7303 Prediabetes: Secondary | ICD-10-CM | POA: Diagnosis not present

## 2016-07-05 DIAGNOSIS — E785 Hyperlipidemia, unspecified: Secondary | ICD-10-CM | POA: Diagnosis not present

## 2016-07-05 DIAGNOSIS — I10 Essential (primary) hypertension: Secondary | ICD-10-CM | POA: Diagnosis not present

## 2016-07-05 DIAGNOSIS — J45909 Unspecified asthma, uncomplicated: Secondary | ICD-10-CM | POA: Diagnosis not present

## 2016-07-25 ENCOUNTER — Emergency Department (HOSPITAL_COMMUNITY)
Admission: EM | Admit: 2016-07-25 | Discharge: 2016-07-25 | Disposition: A | Discharge status: Home / Self Care | Payer: Commercial Managed Care - HMO | Attending: Emergency Medicine | Admitting: Emergency Medicine

## 2016-07-25 ENCOUNTER — Encounter (HOSPITAL_COMMUNITY): Payer: Self-pay

## 2016-07-25 ENCOUNTER — Emergency Department (HOSPITAL_COMMUNITY): Payer: Commercial Managed Care - HMO

## 2016-07-25 DIAGNOSIS — I10 Essential (primary) hypertension: Secondary | ICD-10-CM | POA: Insufficient documentation

## 2016-07-25 DIAGNOSIS — R0602 Shortness of breath: Secondary | ICD-10-CM | POA: Diagnosis not present

## 2016-07-25 DIAGNOSIS — Z87891 Personal history of nicotine dependence: Secondary | ICD-10-CM | POA: Diagnosis not present

## 2016-07-25 DIAGNOSIS — E119 Type 2 diabetes mellitus without complications: Secondary | ICD-10-CM | POA: Insufficient documentation

## 2016-07-25 DIAGNOSIS — F909 Attention-deficit hyperactivity disorder, unspecified type: Secondary | ICD-10-CM | POA: Diagnosis not present

## 2016-07-25 DIAGNOSIS — J40 Bronchitis, not specified as acute or chronic: Secondary | ICD-10-CM | POA: Insufficient documentation

## 2016-07-25 LAB — I-STAT CHEM 8, ED
BUN: 16 mg/dL (ref 6–20)
Calcium, Ion: 1.15 mmol/L (ref 1.15–1.40)
Chloride: 103 mmol/L (ref 101–111)
Creatinine, Ser: 0.8 mg/dL (ref 0.44–1.00)
Glucose, Bld: 101 mg/dL — ABNORMAL HIGH (ref 65–99)
HCT: 39 % (ref 36.0–46.0)
Hemoglobin: 13.3 g/dL (ref 12.0–15.0)
Potassium: 3.3 mmol/L — ABNORMAL LOW (ref 3.5–5.1)
Sodium: 140 mmol/L (ref 135–145)
TCO2: 26 mmol/L (ref 0–100)

## 2016-07-25 MED ORDER — PREDNISONE 20 MG PO TABS
40.0000 mg | ORAL_TABLET | Freq: Once | ORAL | Status: AC
  Administered 2016-07-25: 40 mg via ORAL
  Filled 2016-07-25: qty 2

## 2016-07-25 MED ORDER — PREDNISONE 20 MG PO TABS: ORAL_TABLET | ORAL | 0 refills | Status: DC

## 2016-07-25 MED ORDER — PREDNISONE 20 MG PO TABS: 60.0000 mg | ORAL_TABLET | Freq: Once | ORAL | Status: DC

## 2016-07-25 MED ORDER — POTASSIUM CHLORIDE CRYS ER 20 MEQ PO TBCR
40.0000 meq | EXTENDED_RELEASE_TABLET | Freq: Once | ORAL | Status: AC
  Administered 2016-07-25: 40 meq via ORAL
  Filled 2016-07-25: qty 2

## 2016-07-25 MED ORDER — ALBUTEROL SULFATE (2.5 MG/3ML) 0.083% IN NEBU
5.0000 mg | INHALATION_SOLUTION | Freq: Once | RESPIRATORY_TRACT | Status: AC
  Administered 2016-07-25: 5 mg via RESPIRATORY_TRACT
  Filled 2016-07-25: qty 6

## 2016-07-25 NOTE — ED Notes (Signed)
Pt returned from xray
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
## 2016-07-25 NOTE — Discharge Instructions (Signed)
Use your pro-air inhaler 2 puffs every 4 hours as needed for cough or shortness of breath. Return if needed more than every 4 hours or see Dr.  Vista Lawman in his office.Take the prednisone as prescribed.

## 2016-07-25 NOTE — ED Notes (Signed)
Ambulated Pt. Pt's initial SpO2 was 98% and pulse was 89 bpm. While ambulating, Pt's SpO2 fluctuated between 98-99% and pulse increased to 97 bpm. Pt reported no dizziness and her shortness of breath was the same as when she sits in the bed.

## 2016-07-25 NOTE — ED Triage Notes (Signed)
Patient here with congestion and cough with wheezing x 1 day. Hyperventilating on arrival. Using inhaler with minimal relief. Upper airway congestion with wheezing noted

## 2016-07-25 NOTE — ED Notes (Signed)
Dr. J at bedside 

## 2016-08-02 DIAGNOSIS — J45909 Unspecified asthma, uncomplicated: Secondary | ICD-10-CM | POA: Diagnosis not present

## 2016-08-02 DIAGNOSIS — I10 Essential (primary) hypertension: Secondary | ICD-10-CM | POA: Diagnosis not present

## 2016-08-02 DIAGNOSIS — R7303 Prediabetes: Secondary | ICD-10-CM | POA: Diagnosis not present

## 2016-08-02 DIAGNOSIS — R739 Hyperglycemia, unspecified: Secondary | ICD-10-CM | POA: Diagnosis not present

## 2016-08-02 DIAGNOSIS — E785 Hyperlipidemia, unspecified: Secondary | ICD-10-CM | POA: Diagnosis not present

## 2016-08-02 DIAGNOSIS — R609 Edema, unspecified: Secondary | ICD-10-CM | POA: Diagnosis not present

## 2016-08-02 DIAGNOSIS — E559 Vitamin D deficiency, unspecified: Secondary | ICD-10-CM | POA: Diagnosis not present

## 2016-08-02 DIAGNOSIS — Z720 Tobacco use: Secondary | ICD-10-CM | POA: Diagnosis not present

## 2016-09-05 DIAGNOSIS — I10 Essential (primary) hypertension: Secondary | ICD-10-CM | POA: Diagnosis not present

## 2016-09-05 DIAGNOSIS — E559 Vitamin D deficiency, unspecified: Secondary | ICD-10-CM | POA: Diagnosis not present

## 2016-09-05 DIAGNOSIS — J45909 Unspecified asthma, uncomplicated: Secondary | ICD-10-CM | POA: Diagnosis not present

## 2016-09-05 DIAGNOSIS — Z720 Tobacco use: Secondary | ICD-10-CM | POA: Diagnosis not present

## 2016-09-05 DIAGNOSIS — E785 Hyperlipidemia, unspecified: Secondary | ICD-10-CM | POA: Diagnosis not present

## 2016-09-05 DIAGNOSIS — R609 Edema, unspecified: Secondary | ICD-10-CM | POA: Diagnosis not present

## 2016-09-05 DIAGNOSIS — R7303 Prediabetes: Secondary | ICD-10-CM | POA: Diagnosis not present

## 2016-09-05 DIAGNOSIS — R739 Hyperglycemia, unspecified: Secondary | ICD-10-CM | POA: Diagnosis not present

## 2016-09-26 DIAGNOSIS — J45909 Unspecified asthma, uncomplicated: Secondary | ICD-10-CM | POA: Diagnosis not present

## 2016-09-26 DIAGNOSIS — R609 Edema, unspecified: Secondary | ICD-10-CM | POA: Diagnosis not present

## 2016-09-26 DIAGNOSIS — E785 Hyperlipidemia, unspecified: Secondary | ICD-10-CM | POA: Diagnosis not present

## 2016-09-26 DIAGNOSIS — R7303 Prediabetes: Secondary | ICD-10-CM | POA: Diagnosis not present

## 2016-09-26 DIAGNOSIS — R739 Hyperglycemia, unspecified: Secondary | ICD-10-CM | POA: Diagnosis not present

## 2016-09-26 DIAGNOSIS — E559 Vitamin D deficiency, unspecified: Secondary | ICD-10-CM | POA: Diagnosis not present

## 2016-09-26 DIAGNOSIS — I10 Essential (primary) hypertension: Secondary | ICD-10-CM | POA: Diagnosis not present

## 2016-09-26 DIAGNOSIS — K921 Melena: Secondary | ICD-10-CM | POA: Diagnosis not present

## 2016-09-26 DIAGNOSIS — Z720 Tobacco use: Secondary | ICD-10-CM | POA: Diagnosis not present

## 2016-10-08 NOTE — L&D Delivery Note (Signed)
Delivery Note At 6:16 AM a viable female was delivered via Vaginal, Spontaneous Delivery (Presentation: vtx; ROA ).  APGAR: 9, 9; weight  pending.   Placenta status: spontaneous, intact.  Cord:  with the following complications: none.   Anesthesia: Epidural  Episiotomy: None Lacerations: None Suture Repair: none Est. Blood Loss (mL): 200  Mom to postpartum.  Baby to Couplet care / Skin to Skin.  Blane Ohara Shaquile Lutze 06/12/2017, 6:30 AM

## 2016-10-17 ENCOUNTER — Other Ambulatory Visit: Payer: Self-pay | Admitting: Gastroenterology

## 2016-10-17 DIAGNOSIS — K625 Hemorrhage of anus and rectum: Secondary | ICD-10-CM | POA: Diagnosis not present

## 2016-10-17 DIAGNOSIS — E559 Vitamin D deficiency, unspecified: Secondary | ICD-10-CM | POA: Diagnosis not present

## 2016-10-17 DIAGNOSIS — I499 Cardiac arrhythmia, unspecified: Secondary | ICD-10-CM | POA: Diagnosis not present

## 2016-10-17 DIAGNOSIS — R739 Hyperglycemia, unspecified: Secondary | ICD-10-CM | POA: Diagnosis not present

## 2016-10-17 DIAGNOSIS — R7303 Prediabetes: Secondary | ICD-10-CM | POA: Diagnosis not present

## 2016-10-17 DIAGNOSIS — I10 Essential (primary) hypertension: Secondary | ICD-10-CM | POA: Diagnosis not present

## 2016-10-17 DIAGNOSIS — E785 Hyperlipidemia, unspecified: Secondary | ICD-10-CM | POA: Diagnosis not present

## 2016-10-17 DIAGNOSIS — R635 Abnormal weight gain: Secondary | ICD-10-CM | POA: Diagnosis not present

## 2016-10-17 DIAGNOSIS — R079 Chest pain, unspecified: Secondary | ICD-10-CM | POA: Diagnosis not present

## 2016-10-17 DIAGNOSIS — J45909 Unspecified asthma, uncomplicated: Secondary | ICD-10-CM | POA: Diagnosis not present

## 2016-10-17 DIAGNOSIS — Z720 Tobacco use: Secondary | ICD-10-CM | POA: Diagnosis not present

## 2016-10-17 DIAGNOSIS — R197 Diarrhea, unspecified: Secondary | ICD-10-CM | POA: Diagnosis not present

## 2016-10-17 DIAGNOSIS — K219 Gastro-esophageal reflux disease without esophagitis: Secondary | ICD-10-CM | POA: Diagnosis not present

## 2016-10-19 ENCOUNTER — Inpatient Hospital Stay (HOSPITAL_COMMUNITY)
Admission: AD | Admit: 2016-10-19 | Discharge: 2016-10-19 | Disposition: A | Discharge status: Home / Self Care | Payer: Medicare HMO | Source: Ambulatory Visit | Attending: Obstetrics and Gynecology | Admitting: Obstetrics and Gynecology

## 2016-10-19 ENCOUNTER — Encounter (HOSPITAL_COMMUNITY): Payer: Self-pay | Admitting: *Deleted

## 2016-10-19 ENCOUNTER — Other Ambulatory Visit: Payer: Self-pay | Admitting: Medical

## 2016-10-19 ENCOUNTER — Inpatient Hospital Stay (HOSPITAL_COMMUNITY): Payer: Medicare HMO

## 2016-10-19 DIAGNOSIS — O26891 Other specified pregnancy related conditions, first trimester: Secondary | ICD-10-CM | POA: Insufficient documentation

## 2016-10-19 DIAGNOSIS — O99511 Diseases of the respiratory system complicating pregnancy, first trimester: Secondary | ICD-10-CM | POA: Diagnosis not present

## 2016-10-19 DIAGNOSIS — F319 Bipolar disorder, unspecified: Secondary | ICD-10-CM | POA: Insufficient documentation

## 2016-10-19 DIAGNOSIS — G4733 Obstructive sleep apnea (adult) (pediatric): Secondary | ICD-10-CM | POA: Diagnosis not present

## 2016-10-19 DIAGNOSIS — J45909 Unspecified asthma, uncomplicated: Secondary | ICD-10-CM | POA: Diagnosis not present

## 2016-10-19 DIAGNOSIS — O99351 Diseases of the nervous system complicating pregnancy, first trimester: Secondary | ICD-10-CM | POA: Diagnosis not present

## 2016-10-19 DIAGNOSIS — O161 Unspecified maternal hypertension, first trimester: Secondary | ICD-10-CM | POA: Diagnosis not present

## 2016-10-19 DIAGNOSIS — O24911 Unspecified diabetes mellitus in pregnancy, first trimester: Secondary | ICD-10-CM | POA: Diagnosis not present

## 2016-10-19 DIAGNOSIS — R109 Unspecified abdominal pain: Secondary | ICD-10-CM | POA: Diagnosis not present

## 2016-10-19 DIAGNOSIS — O99341 Other mental disorders complicating pregnancy, first trimester: Secondary | ICD-10-CM | POA: Insufficient documentation

## 2016-10-19 DIAGNOSIS — Z3A01 Less than 8 weeks gestation of pregnancy: Secondary | ICD-10-CM | POA: Diagnosis not present

## 2016-10-19 DIAGNOSIS — O26899 Other specified pregnancy related conditions, unspecified trimester: Secondary | ICD-10-CM

## 2016-10-19 DIAGNOSIS — O3680X0 Pregnancy with inconclusive fetal viability, not applicable or unspecified: Secondary | ICD-10-CM

## 2016-10-19 LAB — CBC WITH DIFFERENTIAL/PLATELET
Basophils Absolute: 0 10*3/uL (ref 0.0–0.1)
Basophils Relative: 0 %
Eosinophils Absolute: 0.3 10*3/uL (ref 0.0–0.7)
Eosinophils Relative: 4 %
HCT: 34.2 % — ABNORMAL LOW (ref 36.0–46.0)
Hemoglobin: 11.5 g/dL — ABNORMAL LOW (ref 12.0–15.0)
Lymphocytes Relative: 47 %
Lymphs Abs: 3.8 10*3/uL (ref 0.7–4.0)
MCH: 29 pg (ref 26.0–34.0)
MCHC: 33.6 g/dL (ref 30.0–36.0)
MCV: 86.1 fL (ref 78.0–100.0)
Monocytes Absolute: 0.3 10*3/uL (ref 0.1–1.0)
Monocytes Relative: 4 %
Neutro Abs: 3.6 10*3/uL (ref 1.7–7.7)
Neutrophils Relative %: 45 %
Platelets: 274 10*3/uL (ref 150–400)
RBC: 3.97 MIL/uL (ref 3.87–5.11)
RDW: 15 % (ref 11.5–15.5)
WBC: 8.1 10*3/uL (ref 4.0–10.5)

## 2016-10-19 LAB — URINALYSIS, ROUTINE W REFLEX MICROSCOPIC
Bacteria, UA: NONE SEEN
Bilirubin Urine: NEGATIVE
Glucose, UA: NEGATIVE mg/dL
Hgb urine dipstick: NEGATIVE
Ketones, ur: NEGATIVE mg/dL
Nitrite: NEGATIVE
Protein, ur: NEGATIVE mg/dL
Specific Gravity, Urine: 1.025 (ref 1.005–1.030)
pH: 5 (ref 5.0–8.0)

## 2016-10-19 LAB — POCT PREGNANCY, URINE: Preg Test, Ur: POSITIVE — AB

## 2016-10-19 LAB — HCG, QUANTITATIVE, PREGNANCY: hCG, Beta Chain, Quant, S: 845 m[IU]/mL — ABNORMAL HIGH (ref <5)

## 2016-10-19 MED ORDER — PROGESTERONE 200 MG VA SUPP: 200.0000 mg | Freq: Every day | VAGINAL | 0 refills | Status: DC
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
## 2016-10-19 NOTE — Discharge Instructions (Signed)

## 2016-10-19 NOTE — MAU Note (Signed)
Having cramps and pain in her hips-started yesterday, that was how she felt before she miscarried the last time.

## 2016-10-21 ENCOUNTER — Inpatient Hospital Stay (HOSPITAL_COMMUNITY)
Admission: AD | Admit: 2016-10-21 | Discharge: 2016-10-21 | Disposition: A | Discharge status: Home / Self Care | Payer: Medicare HMO | Source: Ambulatory Visit | Attending: Obstetrics and Gynecology | Admitting: Obstetrics and Gynecology

## 2016-10-21 DIAGNOSIS — Z79899 Other long term (current) drug therapy: Secondary | ICD-10-CM | POA: Insufficient documentation

## 2016-10-21 DIAGNOSIS — O26851 Spotting complicating pregnancy, first trimester: Secondary | ICD-10-CM

## 2016-10-21 DIAGNOSIS — Z3A01 Less than 8 weeks gestation of pregnancy: Secondary | ICD-10-CM | POA: Insufficient documentation

## 2016-10-21 DIAGNOSIS — O209 Hemorrhage in early pregnancy, unspecified: Secondary | ICD-10-CM | POA: Insufficient documentation

## 2016-10-21 DIAGNOSIS — O26859 Spotting complicating pregnancy, unspecified trimester: Secondary | ICD-10-CM

## 2016-10-21 LAB — HCG, QUANTITATIVE, PREGNANCY: hCG, Beta Chain, Quant, S: 1830 m[IU]/mL — ABNORMAL HIGH (ref <5)

## 2016-10-21 NOTE — MAU Note (Signed)
Patient here for repeat HCG, had some spotting with the progesterone suppository. Mild cramping.

## 2016-10-21 NOTE — MAU Provider Note (Signed)
  Pt here for f/u bHcg. Having slight pink spotting after Prometrium placement, but otherwise feels fine. No pain other than her hips. bHcg on 1/12: 845. U/S showed 5+0 wk sac, no YS or FP.  127/71, T 98.7, P 84 Lungs: nl effort  bHcg today: 1830  Early preg w/ increasing quants Spotting  Pt to call [facility] tomorrow and schedule a new OB appointment in 1-2 weeks; if unable to give appt by 2 weeks may need another quant.  Given bldg/pain precautions Continue Prometrium  Serita Grammes CNM 10/21/2016 5:39 PM

## 2016-10-25 DIAGNOSIS — N912 Amenorrhea, unspecified: Secondary | ICD-10-CM | POA: Diagnosis not present

## 2016-10-25 DIAGNOSIS — Z3201 Encounter for pregnancy test, result positive: Secondary | ICD-10-CM | POA: Diagnosis not present

## 2016-10-26 ENCOUNTER — Encounter (HOSPITAL_COMMUNITY): Admission: RE | Payer: Self-pay | Source: Ambulatory Visit

## 2016-10-26 ENCOUNTER — Ambulatory Visit (HOSPITAL_COMMUNITY): Admission: RE | Admit: 2016-10-26 | Payer: Medicare HMO | Source: Ambulatory Visit | Admitting: Gastroenterology

## 2016-10-26 SURGERY — COLONOSCOPY WITH PROPOFOL
Anesthesia: Monitor Anesthesia Care

## 2016-10-29 DIAGNOSIS — R7303 Prediabetes: Secondary | ICD-10-CM | POA: Diagnosis not present

## 2016-10-29 DIAGNOSIS — I10 Essential (primary) hypertension: Secondary | ICD-10-CM | POA: Diagnosis not present

## 2016-10-29 DIAGNOSIS — J45909 Unspecified asthma, uncomplicated: Secondary | ICD-10-CM | POA: Diagnosis not present

## 2016-10-29 DIAGNOSIS — R635 Abnormal weight gain: Secondary | ICD-10-CM | POA: Diagnosis not present

## 2016-10-29 DIAGNOSIS — E785 Hyperlipidemia, unspecified: Secondary | ICD-10-CM | POA: Diagnosis not present

## 2016-10-29 DIAGNOSIS — E559 Vitamin D deficiency, unspecified: Secondary | ICD-10-CM | POA: Diagnosis not present

## 2016-10-29 DIAGNOSIS — J029 Acute pharyngitis, unspecified: Secondary | ICD-10-CM | POA: Diagnosis not present

## 2016-10-29 DIAGNOSIS — R739 Hyperglycemia, unspecified: Secondary | ICD-10-CM | POA: Diagnosis not present

## 2016-10-29 DIAGNOSIS — Z720 Tobacco use: Secondary | ICD-10-CM | POA: Diagnosis not present

## 2016-11-07 DIAGNOSIS — I10 Essential (primary) hypertension: Secondary | ICD-10-CM | POA: Diagnosis not present

## 2016-11-07 DIAGNOSIS — E559 Vitamin D deficiency, unspecified: Secondary | ICD-10-CM | POA: Diagnosis not present

## 2016-11-07 DIAGNOSIS — J45909 Unspecified asthma, uncomplicated: Secondary | ICD-10-CM | POA: Diagnosis not present

## 2016-11-07 DIAGNOSIS — R7303 Prediabetes: Secondary | ICD-10-CM | POA: Diagnosis not present

## 2016-11-07 DIAGNOSIS — Z720 Tobacco use: Secondary | ICD-10-CM | POA: Diagnosis not present

## 2016-11-07 DIAGNOSIS — E785 Hyperlipidemia, unspecified: Secondary | ICD-10-CM | POA: Diagnosis not present

## 2016-11-07 DIAGNOSIS — R609 Edema, unspecified: Secondary | ICD-10-CM | POA: Diagnosis not present

## 2016-11-07 DIAGNOSIS — R739 Hyperglycemia, unspecified: Secondary | ICD-10-CM | POA: Diagnosis not present

## 2016-11-15 ENCOUNTER — Other Ambulatory Visit: Payer: Self-pay | Admitting: Medical

## 2016-11-19 DIAGNOSIS — O10011 Pre-existing essential hypertension complicating pregnancy, first trimester: Secondary | ICD-10-CM | POA: Diagnosis not present

## 2016-11-19 DIAGNOSIS — Z3689 Encounter for other specified antenatal screening: Secondary | ICD-10-CM | POA: Diagnosis not present

## 2016-11-19 DIAGNOSIS — Z368A Encounter for antenatal screening for other genetic defects: Secondary | ICD-10-CM | POA: Diagnosis not present

## 2016-11-19 DIAGNOSIS — Z113 Encounter for screening for infections with a predominantly sexual mode of transmission: Secondary | ICD-10-CM | POA: Diagnosis not present

## 2016-11-19 DIAGNOSIS — O99211 Obesity complicating pregnancy, first trimester: Secondary | ICD-10-CM | POA: Diagnosis not present

## 2016-11-19 DIAGNOSIS — O26891 Other specified pregnancy related conditions, first trimester: Secondary | ICD-10-CM | POA: Diagnosis not present

## 2016-11-19 DIAGNOSIS — Z3A09 9 weeks gestation of pregnancy: Secondary | ICD-10-CM | POA: Diagnosis not present

## 2016-11-19 LAB — OB RESULTS CONSOLE ANTIBODY SCREEN: Antibody Screen: NEGATIVE

## 2016-11-19 LAB — OB RESULTS CONSOLE GC/CHLAMYDIA
Chlamydia: NEGATIVE
Gonorrhea: NEGATIVE

## 2016-11-19 LAB — OB RESULTS CONSOLE ABO/RH: RH Type: POSITIVE

## 2016-11-19 LAB — OB RESULTS CONSOLE HEPATITIS B SURFACE ANTIGEN: Hepatitis B Surface Ag: NEGATIVE

## 2016-11-19 LAB — OB RESULTS CONSOLE RUBELLA ANTIBODY, IGM: Rubella: IMMUNE

## 2016-11-19 LAB — OB RESULTS CONSOLE HIV ANTIBODY (ROUTINE TESTING): HIV: NONREACTIVE

## 2016-11-19 LAB — OB RESULTS CONSOLE RPR: RPR: NONREACTIVE

## 2016-12-11 DIAGNOSIS — Z3A12 12 weeks gestation of pregnancy: Secondary | ICD-10-CM | POA: Diagnosis not present

## 2016-12-11 DIAGNOSIS — Z3682 Encounter for antenatal screening for nuchal translucency: Secondary | ICD-10-CM | POA: Diagnosis not present

## 2016-12-11 DIAGNOSIS — O10011 Pre-existing essential hypertension complicating pregnancy, first trimester: Secondary | ICD-10-CM | POA: Diagnosis not present

## 2016-12-11 DIAGNOSIS — O99211 Obesity complicating pregnancy, first trimester: Secondary | ICD-10-CM | POA: Diagnosis not present

## 2016-12-12 DIAGNOSIS — R69 Illness, unspecified: Secondary | ICD-10-CM | POA: Diagnosis not present

## 2016-12-17 ENCOUNTER — Encounter (HOSPITAL_COMMUNITY): Payer: Self-pay | Admitting: Emergency Medicine

## 2016-12-17 ENCOUNTER — Ambulatory Visit (HOSPITAL_COMMUNITY)
Admission: EM | Admit: 2016-12-17 | Discharge: 2016-12-17 | Disposition: A | Discharge status: Home / Self Care | Payer: Medicare HMO | Attending: Emergency Medicine | Admitting: Emergency Medicine

## 2016-12-17 DIAGNOSIS — J301 Allergic rhinitis due to pollen: Secondary | ICD-10-CM

## 2016-12-17 DIAGNOSIS — Z3A13 13 weeks gestation of pregnancy: Secondary | ICD-10-CM

## 2016-12-17 DIAGNOSIS — R110 Nausea: Secondary | ICD-10-CM | POA: Diagnosis not present

## 2016-12-17 DIAGNOSIS — R519 Headache, unspecified: Secondary | ICD-10-CM

## 2016-12-17 DIAGNOSIS — G8929 Other chronic pain: Secondary | ICD-10-CM

## 2016-12-17 DIAGNOSIS — R51 Headache: Secondary | ICD-10-CM | POA: Diagnosis not present

## 2016-12-17 MED ORDER — DOXYLAMINE-PYRIDOXINE 10-10 MG PO TBEC: DELAYED_RELEASE_TABLET | ORAL | 0 refills | Status: DC

## 2016-12-17 NOTE — ED Triage Notes (Signed)
Pt  Is     13   Weeks  preg   Ad  Having  headadache   And    Fever   Symptoms       Since  Last  Pm
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
## 2016-12-17 NOTE — ED Triage Notes (Signed)
Headache   Fever   And  Stomach  Ache  Last  Pm

## 2016-12-17 NOTE — Discharge Instructions (Signed)
Take Tylenol every 4 hours as needed for headache. Take the prescription medicine for nausea as directed. Be sure to stay well hydrated and drink plenty of fluids. For allergy symptoms recommend Claritin 10 mg a day. If he get worse or develop new symptoms or problems seek medical attention promptly.

## 2016-12-18 ENCOUNTER — Other Ambulatory Visit: Payer: Self-pay | Admitting: Medical

## 2017-01-11 ENCOUNTER — Emergency Department (HOSPITAL_COMMUNITY)
Admission: EM | Admit: 2017-01-11 | Discharge: 2017-01-11 | Disposition: A | Discharge status: Home / Self Care | Payer: Medicare HMO | Attending: Emergency Medicine | Admitting: Emergency Medicine

## 2017-01-11 ENCOUNTER — Encounter (HOSPITAL_COMMUNITY): Payer: Self-pay

## 2017-01-11 DIAGNOSIS — O9A212 Injury, poisoning and certain other consequences of external causes complicating pregnancy, second trimester: Secondary | ICD-10-CM | POA: Diagnosis not present

## 2017-01-11 DIAGNOSIS — I10 Essential (primary) hypertension: Secondary | ICD-10-CM | POA: Insufficient documentation

## 2017-01-11 DIAGNOSIS — Y939 Activity, unspecified: Secondary | ICD-10-CM | POA: Insufficient documentation

## 2017-01-11 DIAGNOSIS — Z3A16 16 weeks gestation of pregnancy: Secondary | ICD-10-CM | POA: Insufficient documentation

## 2017-01-11 DIAGNOSIS — Y999 Unspecified external cause status: Secondary | ICD-10-CM | POA: Diagnosis not present

## 2017-01-11 DIAGNOSIS — S39012A Strain of muscle, fascia and tendon of lower back, initial encounter: Secondary | ICD-10-CM

## 2017-01-11 DIAGNOSIS — Z79899 Other long term (current) drug therapy: Secondary | ICD-10-CM | POA: Insufficient documentation

## 2017-01-11 DIAGNOSIS — Y92410 Unspecified street and highway as the place of occurrence of the external cause: Secondary | ICD-10-CM | POA: Diagnosis not present

## 2017-01-11 DIAGNOSIS — E119 Type 2 diabetes mellitus without complications: Secondary | ICD-10-CM | POA: Insufficient documentation

## 2017-01-11 DIAGNOSIS — J45909 Unspecified asthma, uncomplicated: Secondary | ICD-10-CM | POA: Insufficient documentation

## 2017-01-11 DIAGNOSIS — F909 Attention-deficit hyperactivity disorder, unspecified type: Secondary | ICD-10-CM | POA: Diagnosis not present

## 2017-01-11 DIAGNOSIS — Z87891 Personal history of nicotine dependence: Secondary | ICD-10-CM | POA: Diagnosis not present

## 2017-01-11 MED ORDER — ACETAMINOPHEN 325 MG PO TABS
650.0000 mg | ORAL_TABLET | Freq: Once | ORAL | Status: AC
  Administered 2017-01-11: 650 mg via ORAL
  Filled 2017-01-11: qty 2
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
## 2017-01-11 NOTE — Discharge Instructions (Signed)
It was our pleasure to provide your ER care today - we hope that you feel better.  Take tylenol as need for pain.  Avoid heavy lifting > 10 lbs, or bending at waist, for the next few days.  Follow up with ob/gyn doctor as planned.  Return to ER if worse, new symptoms, new or severe pain, other concern.   Please make sure your child always wears her seatbelt.

## 2017-01-25 DIAGNOSIS — Z363 Encounter for antenatal screening for malformations: Secondary | ICD-10-CM | POA: Diagnosis not present

## 2017-01-25 DIAGNOSIS — Z3A18 18 weeks gestation of pregnancy: Secondary | ICD-10-CM | POA: Diagnosis not present

## 2017-01-27 ENCOUNTER — Encounter (HOSPITAL_COMMUNITY): Payer: Self-pay | Admitting: Family Medicine

## 2017-01-27 ENCOUNTER — Ambulatory Visit (HOSPITAL_COMMUNITY)
Admission: EM | Admit: 2017-01-27 | Discharge: 2017-01-27 | Disposition: A | Discharge status: Home / Self Care | Payer: Medicare HMO | Attending: Family Medicine | Admitting: Family Medicine

## 2017-01-27 DIAGNOSIS — J4521 Mild intermittent asthma with (acute) exacerbation: Secondary | ICD-10-CM | POA: Diagnosis not present

## 2017-01-27 MED ORDER — PREDNISONE 20 MG PO TABS: ORAL_TABLET | ORAL | 0 refills | Status: DC

## 2017-01-27 NOTE — ED Triage Notes (Signed)
Patient reports 3 day history of cough and SOB, patient with history asthma. Patient states she has been taking allergy medication with no relief. Patient states history of bronchitis. No fevers.
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
## 2017-01-28 DIAGNOSIS — Z720 Tobacco use: Secondary | ICD-10-CM | POA: Diagnosis not present

## 2017-01-28 DIAGNOSIS — I10 Essential (primary) hypertension: Secondary | ICD-10-CM | POA: Diagnosis not present

## 2017-01-28 DIAGNOSIS — R739 Hyperglycemia, unspecified: Secondary | ICD-10-CM | POA: Diagnosis not present

## 2017-01-28 DIAGNOSIS — J45909 Unspecified asthma, uncomplicated: Secondary | ICD-10-CM | POA: Diagnosis not present

## 2017-01-28 DIAGNOSIS — R7303 Prediabetes: Secondary | ICD-10-CM | POA: Diagnosis not present

## 2017-01-28 DIAGNOSIS — E785 Hyperlipidemia, unspecified: Secondary | ICD-10-CM | POA: Diagnosis not present

## 2017-01-28 DIAGNOSIS — E559 Vitamin D deficiency, unspecified: Secondary | ICD-10-CM | POA: Diagnosis not present

## 2017-01-28 DIAGNOSIS — R609 Edema, unspecified: Secondary | ICD-10-CM | POA: Diagnosis not present

## 2017-03-07 ENCOUNTER — Inpatient Hospital Stay (HOSPITAL_COMMUNITY)
Admission: AD | Admit: 2017-03-07 | Discharge: 2017-03-07 | Disposition: A | Discharge status: Home / Self Care | Payer: Medicare HMO | Source: Ambulatory Visit | Attending: Obstetrics and Gynecology | Admitting: Obstetrics and Gynecology

## 2017-03-07 ENCOUNTER — Encounter (HOSPITAL_COMMUNITY): Payer: Self-pay | Admitting: *Deleted

## 2017-03-07 DIAGNOSIS — Z833 Family history of diabetes mellitus: Secondary | ICD-10-CM | POA: Diagnosis not present

## 2017-03-07 DIAGNOSIS — O10012 Pre-existing essential hypertension complicating pregnancy, second trimester: Secondary | ICD-10-CM | POA: Diagnosis not present

## 2017-03-07 DIAGNOSIS — Z888 Allergy status to other drugs, medicaments and biological substances status: Secondary | ICD-10-CM | POA: Diagnosis not present

## 2017-03-07 DIAGNOSIS — G4733 Obstructive sleep apnea (adult) (pediatric): Secondary | ICD-10-CM | POA: Insufficient documentation

## 2017-03-07 DIAGNOSIS — E669 Obesity, unspecified: Secondary | ICD-10-CM | POA: Diagnosis not present

## 2017-03-07 DIAGNOSIS — O99212 Obesity complicating pregnancy, second trimester: Secondary | ICD-10-CM | POA: Diagnosis not present

## 2017-03-07 DIAGNOSIS — O9989 Other specified diseases and conditions complicating pregnancy, childbirth and the puerperium: Secondary | ICD-10-CM

## 2017-03-07 DIAGNOSIS — O99213 Obesity complicating pregnancy, third trimester: Secondary | ICD-10-CM | POA: Diagnosis not present

## 2017-03-07 DIAGNOSIS — O99353 Diseases of the nervous system complicating pregnancy, third trimester: Secondary | ICD-10-CM | POA: Insufficient documentation

## 2017-03-07 DIAGNOSIS — Z87891 Personal history of nicotine dependence: Secondary | ICD-10-CM | POA: Diagnosis not present

## 2017-03-07 DIAGNOSIS — R197 Diarrhea, unspecified: Secondary | ICD-10-CM | POA: Insufficient documentation

## 2017-03-07 DIAGNOSIS — Z6841 Body Mass Index (BMI) 40.0 and over, adult: Secondary | ICD-10-CM | POA: Insufficient documentation

## 2017-03-07 DIAGNOSIS — Z3A28 28 weeks gestation of pregnancy: Secondary | ICD-10-CM | POA: Insufficient documentation

## 2017-03-07 DIAGNOSIS — R194 Change in bowel habit: Secondary | ICD-10-CM | POA: Diagnosis not present

## 2017-03-07 DIAGNOSIS — O26893 Other specified pregnancy related conditions, third trimester: Secondary | ICD-10-CM | POA: Insufficient documentation

## 2017-03-07 DIAGNOSIS — Z3A24 24 weeks gestation of pregnancy: Secondary | ICD-10-CM | POA: Diagnosis not present

## 2017-03-07 LAB — URINALYSIS, ROUTINE W REFLEX MICROSCOPIC
Bilirubin Urine: NEGATIVE
Glucose, UA: NEGATIVE mg/dL
Hgb urine dipstick: NEGATIVE
Ketones, ur: NEGATIVE mg/dL
Leukocytes, UA: NEGATIVE
Nitrite: NEGATIVE
Protein, ur: NEGATIVE mg/dL
Specific Gravity, Urine: 1.019 (ref 1.005–1.030)
pH: 6 (ref 5.0–8.0)

## 2017-03-07 NOTE — MAU Note (Signed)
Discharge instructions reviewed with patient, patient verbalized understanding. E-signature disconnected, pt. Signed hard copy, copy placed in pt.'s chart and sent to medical records.

## 2017-03-07 NOTE — MAU Note (Signed)
Pt. Complaint, "I feel like my stomach has gotten smaller. Pt. Denies vaginal bleeding or discharge.  Positive for fetal movement.  Pt. States her due date is June 23, 2017.  ([redacted] weeks gestation). Last sexual intercourse was yesterday.
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
## 2017-03-07 NOTE — MAU Note (Signed)
PT  SAYS SHE  THINKS  HER ABD  HAS GOTTEN SMALLER  SINCE Tuesday .  FEELS  BABY MOVE .     [facility]- WITH  DR [place]

## 2017-03-07 NOTE — Discharge Instructions (Signed)
INCREASE WATER INTAKE DAILY

## 2017-03-08 DIAGNOSIS — R7303 Prediabetes: Secondary | ICD-10-CM | POA: Diagnosis not present

## 2017-03-08 DIAGNOSIS — E785 Hyperlipidemia, unspecified: Secondary | ICD-10-CM | POA: Diagnosis not present

## 2017-03-08 DIAGNOSIS — J45909 Unspecified asthma, uncomplicated: Secondary | ICD-10-CM | POA: Diagnosis not present

## 2017-03-08 DIAGNOSIS — Z720 Tobacco use: Secondary | ICD-10-CM | POA: Diagnosis not present

## 2017-03-08 DIAGNOSIS — Z131 Encounter for screening for diabetes mellitus: Secondary | ICD-10-CM | POA: Diagnosis not present

## 2017-03-08 DIAGNOSIS — Z0110 Encounter for examination of ears and hearing without abnormal findings: Secondary | ICD-10-CM | POA: Diagnosis not present

## 2017-03-08 DIAGNOSIS — R609 Edema, unspecified: Secondary | ICD-10-CM | POA: Diagnosis not present

## 2017-03-08 DIAGNOSIS — Z0000 Encounter for general adult medical examination without abnormal findings: Secondary | ICD-10-CM | POA: Diagnosis not present

## 2017-03-08 DIAGNOSIS — I10 Essential (primary) hypertension: Secondary | ICD-10-CM | POA: Diagnosis not present

## 2017-03-08 DIAGNOSIS — E559 Vitamin D deficiency, unspecified: Secondary | ICD-10-CM | POA: Diagnosis not present

## 2017-03-08 NOTE — Progress Notes (Signed)
FHT from 5-31 reviewed.  Reassuring tracing for 28 weeks, no ctx.

## 2017-04-01 DIAGNOSIS — Z3A28 28 weeks gestation of pregnancy: Secondary | ICD-10-CM | POA: Diagnosis not present

## 2017-04-01 DIAGNOSIS — O99213 Obesity complicating pregnancy, third trimester: Secondary | ICD-10-CM | POA: Diagnosis not present

## 2017-04-01 DIAGNOSIS — O10013 Pre-existing essential hypertension complicating pregnancy, third trimester: Secondary | ICD-10-CM | POA: Diagnosis not present

## 2017-04-01 DIAGNOSIS — Z3689 Encounter for other specified antenatal screening: Secondary | ICD-10-CM | POA: Diagnosis not present

## 2017-04-01 DIAGNOSIS — Z23 Encounter for immunization: Secondary | ICD-10-CM | POA: Diagnosis not present

## 2017-04-01 DIAGNOSIS — O169 Unspecified maternal hypertension, unspecified trimester: Secondary | ICD-10-CM | POA: Diagnosis not present

## 2017-04-04 ENCOUNTER — Encounter (HOSPITAL_COMMUNITY): Payer: Self-pay | Admitting: Emergency Medicine

## 2017-04-04 ENCOUNTER — Ambulatory Visit (HOSPITAL_COMMUNITY)
Admission: EM | Admit: 2017-04-04 | Discharge: 2017-04-04 | Disposition: A | Discharge status: Home / Self Care | Payer: Medicare HMO | Attending: Internal Medicine | Admitting: Internal Medicine

## 2017-04-04 DIAGNOSIS — S40861A Insect bite (nonvenomous) of right upper arm, initial encounter: Secondary | ICD-10-CM | POA: Diagnosis not present

## 2017-04-04 DIAGNOSIS — W57XXXA Bitten or stung by nonvenomous insect and other nonvenomous arthropods, initial encounter: Secondary | ICD-10-CM

## 2017-04-04 MED ORDER — ACETAMINOPHEN 325 MG PO TABS
650.0000 mg | ORAL_TABLET | Freq: Once | ORAL | Status: AC
  Administered 2017-04-04: 650 mg via ORAL

## 2017-04-04 MED ORDER — DIPHENHYDRAMINE HCL 25 MG PO CAPS: 25.0000 mg | ORAL_CAPSULE | Freq: Four times a day (QID) | ORAL | 0 refills | Status: DC | PRN

## 2017-04-04 MED ORDER — ACETAMINOPHEN 325 MG PO TABS
ORAL_TABLET | ORAL | Status: AC
  Filled 2017-04-04: qty 2

## 2017-04-04 NOTE — ED Triage Notes (Addendum)
Patient has two bumps to inner right elbow.  Patient felt a stinging sensation when this was noticed.  Incident occurred today  Came to ucc because throat felt "funny"
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
## 2017-04-08 DIAGNOSIS — R7309 Other abnormal glucose: Secondary | ICD-10-CM | POA: Diagnosis not present

## 2017-04-19 ENCOUNTER — Inpatient Hospital Stay (HOSPITAL_COMMUNITY)
Admission: AD | Admit: 2017-04-19 | Discharge: 2017-04-19 | Disposition: A | Discharge status: Home / Self Care | Payer: Medicare HMO | Source: Ambulatory Visit | Attending: Obstetrics and Gynecology | Admitting: Obstetrics and Gynecology

## 2017-04-19 ENCOUNTER — Encounter (HOSPITAL_COMMUNITY): Payer: Self-pay | Admitting: *Deleted

## 2017-04-19 DIAGNOSIS — R102 Pelvic and perineal pain: Secondary | ICD-10-CM | POA: Diagnosis not present

## 2017-04-19 DIAGNOSIS — Z3A30 30 weeks gestation of pregnancy: Secondary | ICD-10-CM | POA: Insufficient documentation

## 2017-04-19 DIAGNOSIS — Z87891 Personal history of nicotine dependence: Secondary | ICD-10-CM | POA: Diagnosis not present

## 2017-04-19 DIAGNOSIS — O26893 Other specified pregnancy related conditions, third trimester: Secondary | ICD-10-CM

## 2017-04-19 DIAGNOSIS — R109 Unspecified abdominal pain: Secondary | ICD-10-CM | POA: Diagnosis present

## 2017-04-19 DIAGNOSIS — O26899 Other specified pregnancy related conditions, unspecified trimester: Secondary | ICD-10-CM

## 2017-04-19 LAB — URINALYSIS, ROUTINE W REFLEX MICROSCOPIC
Bilirubin Urine: NEGATIVE
Glucose, UA: NEGATIVE mg/dL
Hgb urine dipstick: NEGATIVE
Ketones, ur: NEGATIVE mg/dL
Leukocytes, UA: NEGATIVE
Nitrite: NEGATIVE
Protein, ur: NEGATIVE mg/dL
Specific Gravity, Urine: 1.018 (ref 1.005–1.030)
pH: 6 (ref 5.0–8.0)

## 2017-04-19 MED ORDER — COMFORT FIT MATERNITY SUPP LG MISC: 1.0000 [IU] | Freq: Every day | 0 refills | Status: DC

## 2017-04-19 NOTE — MAU Note (Signed)
Was seen in office today. Was told if continued to come in for observation. Have been having pain for 3 days. Constant for 2 days but today on and off. Worse with walking and movement. No LOF or bleeding
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
## 2017-04-19 NOTE — Discharge Instructions (Signed)

## 2017-04-30 DIAGNOSIS — Z3A32 32 weeks gestation of pregnancy: Secondary | ICD-10-CM | POA: Diagnosis not present

## 2017-04-30 DIAGNOSIS — O10013 Pre-existing essential hypertension complicating pregnancy, third trimester: Secondary | ICD-10-CM | POA: Diagnosis not present

## 2017-05-03 DIAGNOSIS — Z3A32 32 weeks gestation of pregnancy: Secondary | ICD-10-CM | POA: Diagnosis not present

## 2017-05-03 DIAGNOSIS — O10013 Pre-existing essential hypertension complicating pregnancy, third trimester: Secondary | ICD-10-CM | POA: Diagnosis not present

## 2017-05-07 DIAGNOSIS — Z3A33 33 weeks gestation of pregnancy: Secondary | ICD-10-CM | POA: Diagnosis not present

## 2017-05-07 DIAGNOSIS — O10013 Pre-existing essential hypertension complicating pregnancy, third trimester: Secondary | ICD-10-CM | POA: Diagnosis not present

## 2017-05-10 DIAGNOSIS — Z3A33 33 weeks gestation of pregnancy: Secondary | ICD-10-CM | POA: Diagnosis not present

## 2017-05-10 DIAGNOSIS — O10013 Pre-existing essential hypertension complicating pregnancy, third trimester: Secondary | ICD-10-CM | POA: Diagnosis not present

## 2017-05-14 DIAGNOSIS — Z3A34 34 weeks gestation of pregnancy: Secondary | ICD-10-CM | POA: Diagnosis not present

## 2017-05-14 DIAGNOSIS — O10013 Pre-existing essential hypertension complicating pregnancy, third trimester: Secondary | ICD-10-CM | POA: Diagnosis not present

## 2017-05-15 DIAGNOSIS — E785 Hyperlipidemia, unspecified: Secondary | ICD-10-CM | POA: Diagnosis not present

## 2017-05-15 DIAGNOSIS — E559 Vitamin D deficiency, unspecified: Secondary | ICD-10-CM | POA: Diagnosis not present

## 2017-05-15 DIAGNOSIS — W57XXXA Bitten or stung by nonvenomous insect and other nonvenomous arthropods, initial encounter: Secondary | ICD-10-CM | POA: Diagnosis not present

## 2017-05-15 DIAGNOSIS — R609 Edema, unspecified: Secondary | ICD-10-CM | POA: Diagnosis not present

## 2017-05-15 DIAGNOSIS — R7303 Prediabetes: Secondary | ICD-10-CM | POA: Diagnosis not present

## 2017-05-15 DIAGNOSIS — I10 Essential (primary) hypertension: Secondary | ICD-10-CM | POA: Diagnosis not present

## 2017-05-15 DIAGNOSIS — Z720 Tobacco use: Secondary | ICD-10-CM | POA: Diagnosis not present

## 2017-05-15 DIAGNOSIS — J45909 Unspecified asthma, uncomplicated: Secondary | ICD-10-CM | POA: Diagnosis not present

## 2017-05-17 DIAGNOSIS — Z3A34 34 weeks gestation of pregnancy: Secondary | ICD-10-CM | POA: Diagnosis not present

## 2017-05-17 DIAGNOSIS — O10113 Pre-existing hypertensive heart disease complicating pregnancy, third trimester: Secondary | ICD-10-CM | POA: Diagnosis not present

## 2017-05-21 DIAGNOSIS — O10013 Pre-existing essential hypertension complicating pregnancy, third trimester: Secondary | ICD-10-CM | POA: Diagnosis not present

## 2017-05-21 DIAGNOSIS — Z3A35 35 weeks gestation of pregnancy: Secondary | ICD-10-CM | POA: Diagnosis not present

## 2017-05-24 DIAGNOSIS — Z3A35 35 weeks gestation of pregnancy: Secondary | ICD-10-CM | POA: Diagnosis not present

## 2017-05-24 DIAGNOSIS — O10013 Pre-existing essential hypertension complicating pregnancy, third trimester: Secondary | ICD-10-CM | POA: Diagnosis not present

## 2017-05-28 DIAGNOSIS — Z3A36 36 weeks gestation of pregnancy: Secondary | ICD-10-CM | POA: Diagnosis not present

## 2017-05-28 DIAGNOSIS — O10013 Pre-existing essential hypertension complicating pregnancy, third trimester: Secondary | ICD-10-CM | POA: Diagnosis not present

## 2017-05-29 DIAGNOSIS — Z3685 Encounter for antenatal screening for Streptococcus B: Secondary | ICD-10-CM | POA: Diagnosis not present

## 2017-05-29 DIAGNOSIS — O99213 Obesity complicating pregnancy, third trimester: Secondary | ICD-10-CM | POA: Diagnosis not present

## 2017-05-29 DIAGNOSIS — O10013 Pre-existing essential hypertension complicating pregnancy, third trimester: Secondary | ICD-10-CM | POA: Diagnosis not present

## 2017-05-29 DIAGNOSIS — Z3A36 36 weeks gestation of pregnancy: Secondary | ICD-10-CM | POA: Diagnosis not present

## 2017-05-30 ENCOUNTER — Encounter (HOSPITAL_COMMUNITY): Payer: Self-pay | Admitting: *Deleted

## 2017-05-30 ENCOUNTER — Telehealth (HOSPITAL_COMMUNITY): Payer: Self-pay | Admitting: *Deleted

## 2017-05-30 LAB — OB RESULTS CONSOLE GBS: GBS: NEGATIVE

## 2017-05-30 NOTE — Telephone Encounter (Signed)
Preadmission screen  

## 2017-05-31 DIAGNOSIS — R7303 Prediabetes: Secondary | ICD-10-CM | POA: Diagnosis not present

## 2017-05-31 DIAGNOSIS — E559 Vitamin D deficiency, unspecified: Secondary | ICD-10-CM | POA: Diagnosis not present

## 2017-05-31 DIAGNOSIS — Z3A36 36 weeks gestation of pregnancy: Secondary | ICD-10-CM | POA: Diagnosis not present

## 2017-05-31 DIAGNOSIS — E785 Hyperlipidemia, unspecified: Secondary | ICD-10-CM | POA: Diagnosis not present

## 2017-05-31 DIAGNOSIS — W57XXXA Bitten or stung by nonvenomous insect and other nonvenomous arthropods, initial encounter: Secondary | ICD-10-CM | POA: Diagnosis not present

## 2017-05-31 DIAGNOSIS — I889 Nonspecific lymphadenitis, unspecified: Secondary | ICD-10-CM | POA: Diagnosis not present

## 2017-05-31 DIAGNOSIS — J45909 Unspecified asthma, uncomplicated: Secondary | ICD-10-CM | POA: Diagnosis not present

## 2017-05-31 DIAGNOSIS — Z720 Tobacco use: Secondary | ICD-10-CM | POA: Diagnosis not present

## 2017-05-31 DIAGNOSIS — R609 Edema, unspecified: Secondary | ICD-10-CM | POA: Diagnosis not present

## 2017-05-31 DIAGNOSIS — O10013 Pre-existing essential hypertension complicating pregnancy, third trimester: Secondary | ICD-10-CM | POA: Diagnosis not present

## 2017-05-31 IMAGING — US US OB COMP LESS 14 WK
1 series · 15 of 28 positions shown · non-contrast
Comparison: None.

CLINICAL DATA: Abdominal pain

EXAM:
OBSTETRIC <14 WK US AND TRANSVAGINAL OB US
TECHNIQUE: Both transabdominal and transvaginal ultrasound examinations were
performed for complete evaluation of the gestation as well as the
maternal uterus, adnexal regions, and pelvic cul-de-sac.
Transvaginal technique was performed to assess early pregnancy.

[Series 1: us ob comp less 14 wk · 15 of 59 slices shown]
[im 1/59]
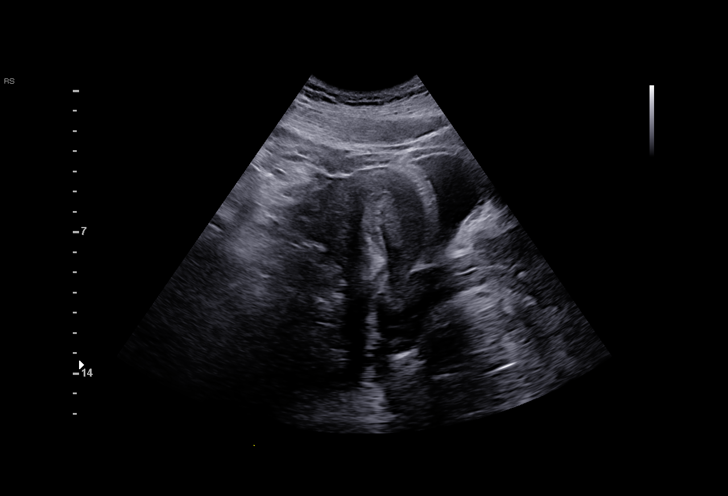
[im 5/59]
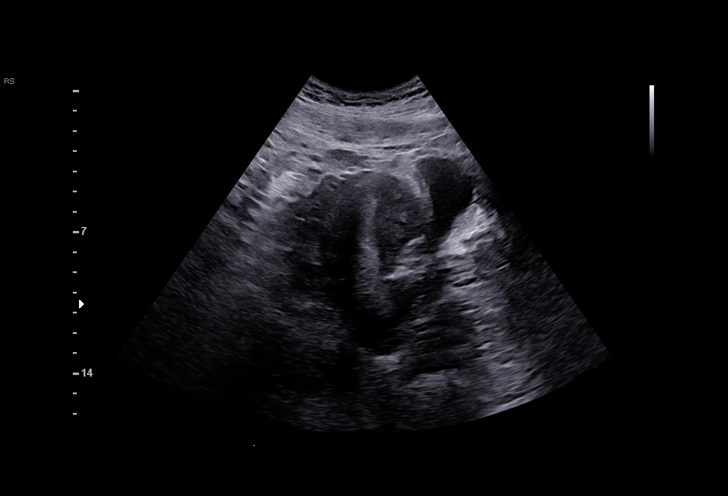
[im 9/59]
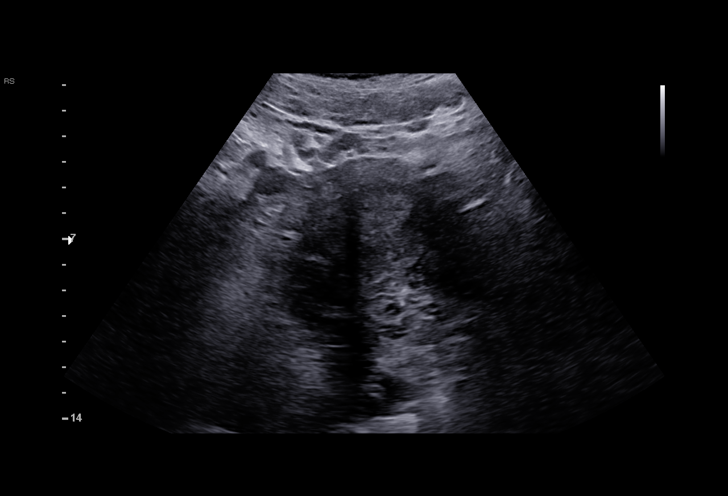
[im 13/59]
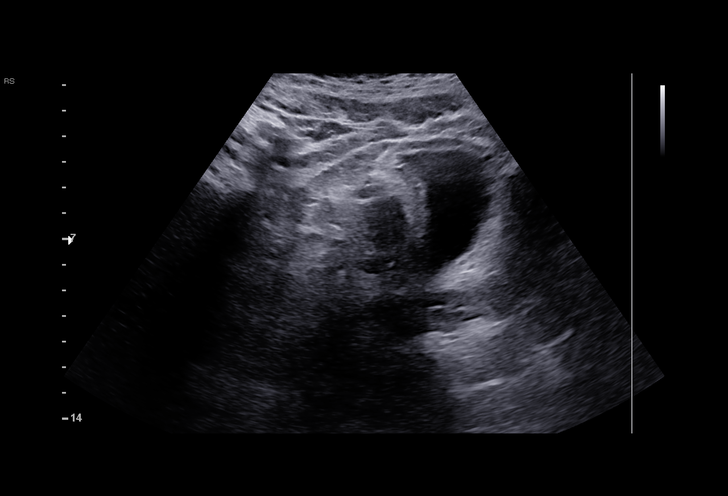
[im 18/59]
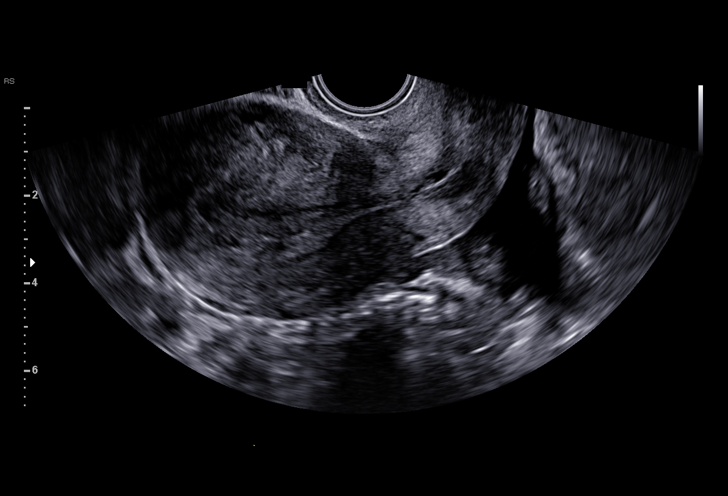
[im 22/59]
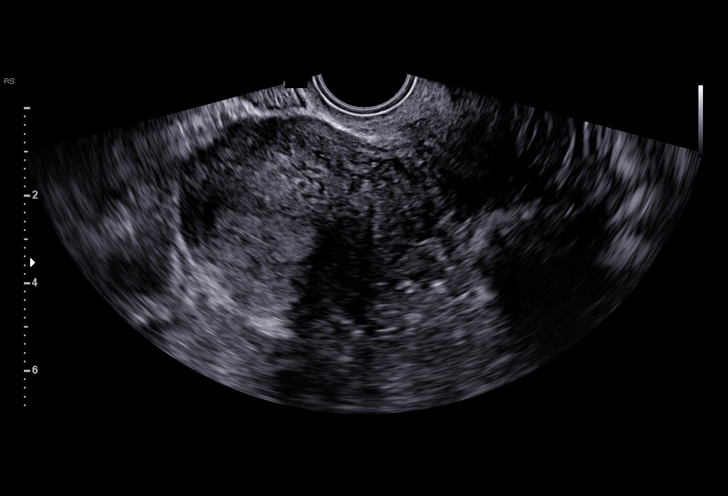
[im 26/59]
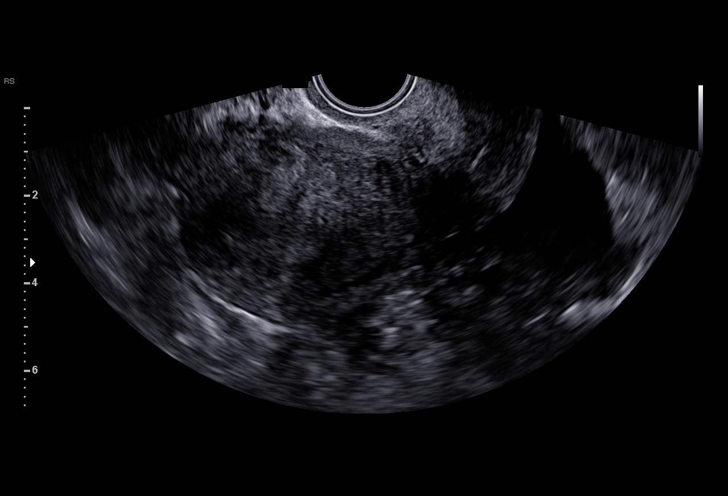
[im 31/59]
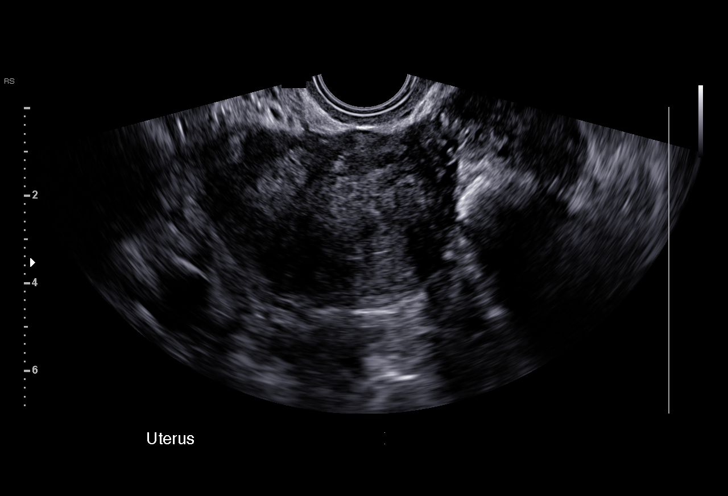
[im 33/59]
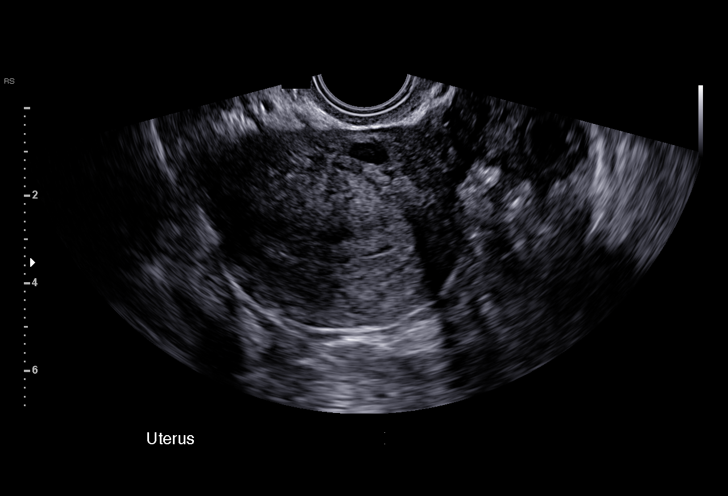
[im 37/59]
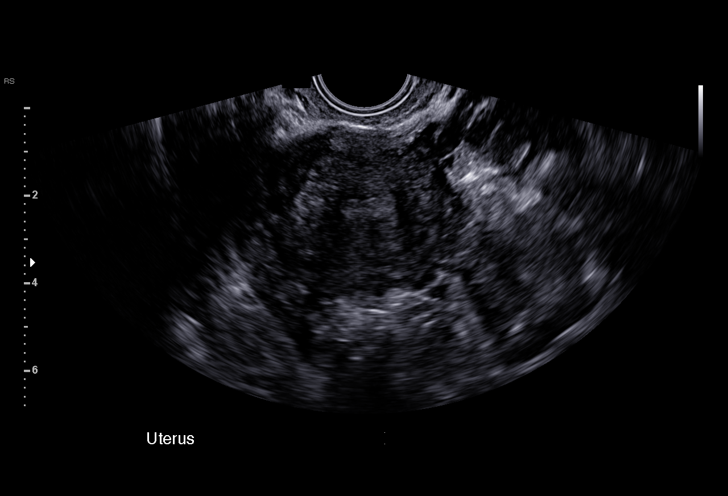
[im 41/59]
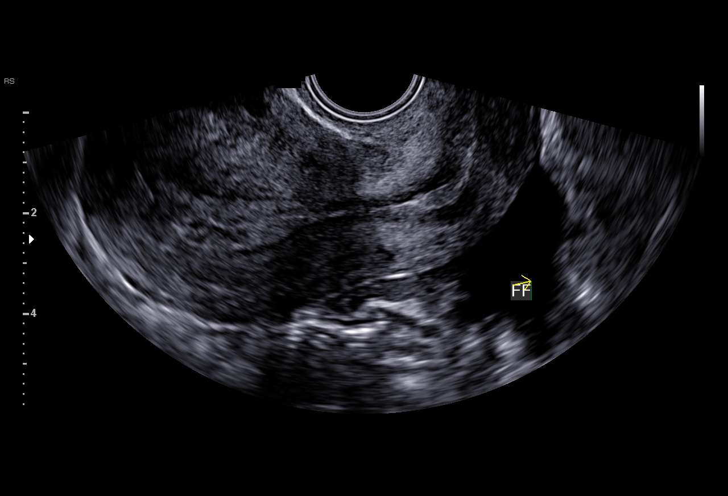
[im 46/59]
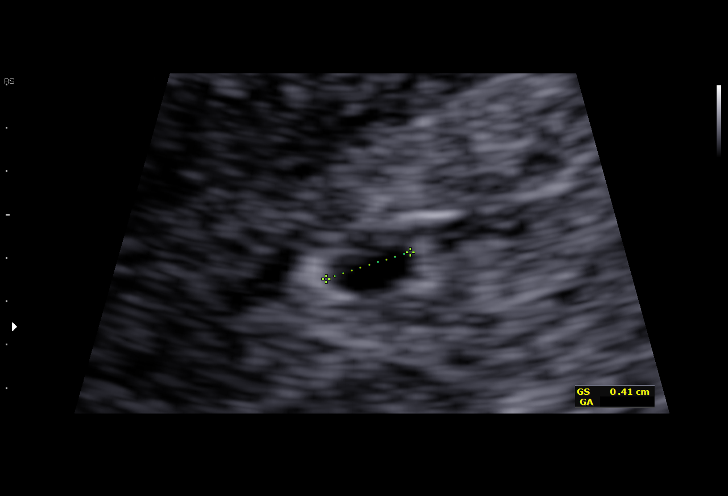
[im 50/59]
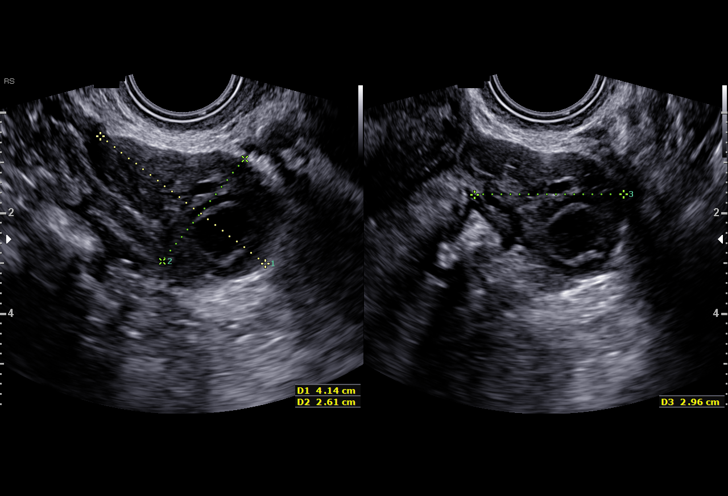
[im 54/59]
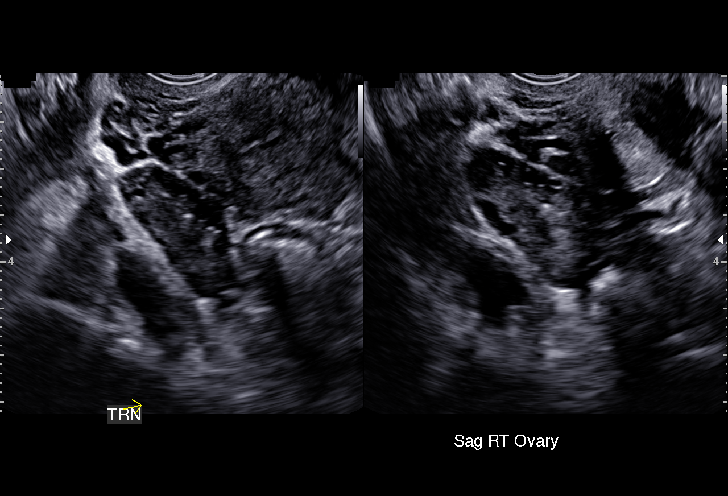
[im 59/59]
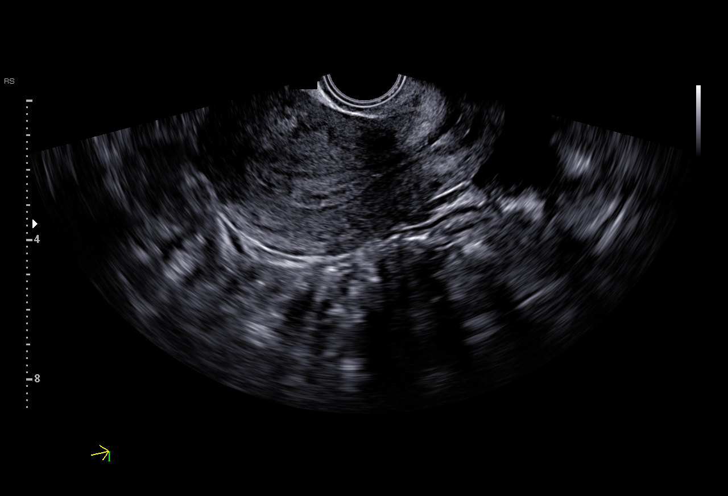

[15 of 28 positions shown; findings below may reference images not displayed]

FINDINGS: Intrauterine gestational sac: Possible tiny intrauterine gestational
sac visualized.

Yolk sac:  Not visualized

Embryo:  Not visualized

Cardiac Activity: Not visualized

MSD: 3.1  mm   5 w   0  d

Subchorionic hemorrhage:  None visualized.

Maternal uterus/adnexae: Hypoechoic mass anterior wall of the uterus
measuring 0.9 x 0.6 x 0.9 cm may represent a small fibroid.
Bilateral ovaries are within normal limits. Right ovary measures
x 2 x 2 cm. The left ovary measures 4.1 x 2.6 x 3 cm. Corpus luteal
cysts present in the left ovary. Small free fluid is present.
IMPRESSION: 1. Possible tiny intrauterine gestation sac is identified, however
no yolk sac or fetal pole is visualized. Probable early intrauterine
gestational sac, but no yolk sac, fetal pole, or cardiac activity
yet visualized. Recommend follow-up quantitative B-HCG levels and
follow-up US in 14 days to assess viability. This recommendation
follows SRU consensus guidelines: Diagnostic Criteria for Nonviable
Pregnancy Early in the First Trimester. N Engl J Med 1036;
[DATE].
2. Possible 0.9 cm small fibroid in the anterior wall of the uterus
3. Small amount of free fluid in the pelvis

## 2017-06-03 DIAGNOSIS — O99213 Obesity complicating pregnancy, third trimester: Secondary | ICD-10-CM | POA: Diagnosis not present

## 2017-06-03 DIAGNOSIS — O10013 Pre-existing essential hypertension complicating pregnancy, third trimester: Secondary | ICD-10-CM | POA: Diagnosis not present

## 2017-06-03 DIAGNOSIS — Z3A37 37 weeks gestation of pregnancy: Secondary | ICD-10-CM | POA: Diagnosis not present

## 2017-06-03 DIAGNOSIS — O10012 Pre-existing essential hypertension complicating pregnancy, second trimester: Secondary | ICD-10-CM | POA: Diagnosis not present

## 2017-06-03 DIAGNOSIS — O368390 Maternal care for abnormalities of the fetal heart rate or rhythm, unspecified trimester, not applicable or unspecified: Secondary | ICD-10-CM | POA: Diagnosis not present

## 2017-06-06 DIAGNOSIS — O10013 Pre-existing essential hypertension complicating pregnancy, third trimester: Secondary | ICD-10-CM | POA: Diagnosis not present

## 2017-06-06 DIAGNOSIS — Z3A37 37 weeks gestation of pregnancy: Secondary | ICD-10-CM | POA: Diagnosis not present

## 2017-06-11 ENCOUNTER — Encounter (HOSPITAL_COMMUNITY): Payer: Self-pay

## 2017-06-11 ENCOUNTER — Inpatient Hospital Stay (HOSPITAL_COMMUNITY): Payer: Medicare HMO | Admitting: Anesthesiology

## 2017-06-11 ENCOUNTER — Inpatient Hospital Stay (HOSPITAL_COMMUNITY)
Admission: RE | Admit: 2017-06-11 | Discharge: 2017-06-13 | DRG: 774 | Disposition: A | Discharge status: Home / Self Care | Payer: Medicare HMO | Source: Ambulatory Visit | Attending: Obstetrics and Gynecology | Admitting: Obstetrics and Gynecology

## 2017-06-11 DIAGNOSIS — O99214 Obesity complicating childbirth: Secondary | ICD-10-CM | POA: Diagnosis not present

## 2017-06-11 DIAGNOSIS — O9952 Diseases of the respiratory system complicating childbirth: Secondary | ICD-10-CM | POA: Diagnosis not present

## 2017-06-11 DIAGNOSIS — O1002 Pre-existing essential hypertension complicating childbirth: Principal | ICD-10-CM | POA: Diagnosis present

## 2017-06-11 DIAGNOSIS — O169 Unspecified maternal hypertension, unspecified trimester: Secondary | ICD-10-CM | POA: Diagnosis present

## 2017-06-11 DIAGNOSIS — O134 Gestational [pregnancy-induced] hypertension without significant proteinuria, complicating childbirth: Secondary | ICD-10-CM | POA: Diagnosis not present

## 2017-06-11 DIAGNOSIS — Z3A38 38 weeks gestation of pregnancy: Secondary | ICD-10-CM

## 2017-06-11 DIAGNOSIS — J45909 Unspecified asthma, uncomplicated: Secondary | ICD-10-CM | POA: Diagnosis not present

## 2017-06-11 DIAGNOSIS — Z87891 Personal history of nicotine dependence: Secondary | ICD-10-CM

## 2017-06-11 DIAGNOSIS — Z6841 Body Mass Index (BMI) 40.0 and over, adult: Secondary | ICD-10-CM | POA: Diagnosis not present

## 2017-06-11 LAB — COMPREHENSIVE METABOLIC PANEL
ALT: 13 U/L — ABNORMAL LOW (ref 14–54)
AST: 23 U/L (ref 15–41)
Albumin: 2.9 g/dL — ABNORMAL LOW (ref 3.5–5.0)
Alkaline Phosphatase: 141 U/L — ABNORMAL HIGH (ref 38–126)
Anion gap: 9 (ref 5–15)
BUN: 8 mg/dL (ref 6–20)
CO2: 19 mmol/L — ABNORMAL LOW (ref 22–32)
Calcium: 9.4 mg/dL (ref 8.9–10.3)
Chloride: 109 mmol/L (ref 101–111)
Creatinine, Ser: 0.59 mg/dL (ref 0.44–1.00)
GFR calc Af Amer: >60 mL/min (ref >60)
GFR calc non Af Amer: >60 mL/min (ref >60)
Glucose, Bld: 97 mg/dL (ref 65–99)
Potassium: 4.1 mmol/L (ref 3.5–5.1)
Sodium: 137 mmol/L (ref 135–145)
Total Bilirubin: 0.8 mg/dL (ref 0.3–1.2)
Total Protein: 6.1 g/dL — ABNORMAL LOW (ref 6.5–8.1)

## 2017-06-11 LAB — CBC
HCT: 35.5 % — ABNORMAL LOW (ref 36.0–46.0)
HCT: 36.1 % (ref 36.0–46.0)
Hemoglobin: 12 g/dL (ref 12.0–15.0)
Hemoglobin: 12.1 g/dL (ref 12.0–15.0)
MCH: 29.1 pg (ref 26.0–34.0)
MCH: 29 pg (ref 26.0–34.0)
MCHC: 33.8 g/dL (ref 30.0–36.0)
MCHC: 33.5 g/dL (ref 30.0–36.0)
MCV: 86.2 fL (ref 78.0–100.0)
MCV: 86.6 fL (ref 78.0–100.0)
Platelets: 227 10*3/uL (ref 150–400)
Platelets: 209 10*3/uL (ref 150–400)
RBC: 4.12 MIL/uL (ref 3.87–5.11)
RBC: 4.17 MIL/uL (ref 3.87–5.11)
RDW: 15.3 % (ref 11.5–15.5)
RDW: 15.5 % (ref 11.5–15.5)
WBC: 6.4 10*3/uL (ref 4.0–10.5)
WBC: 6.2 10*3/uL (ref 4.0–10.5)

## 2017-06-11 LAB — TYPE AND SCREEN
ABO/RH(D): A POS
Antibody Screen: NEGATIVE

## 2017-06-11 LAB — RPR: RPR Ser Ql: NONREACTIVE

## 2017-06-11 MED ORDER — EPHEDRINE 5 MG/ML INJ
10.0000 mg | INTRAVENOUS | Status: DC | PRN
  Filled 2017-06-11: qty 2

## 2017-06-11 MED ORDER — LACTATED RINGERS IV SOLN
500.0000 mL | INTRAVENOUS | Status: DC | PRN
  Administered 2017-06-12: 500 mL via INTRAVENOUS

## 2017-06-11 MED ORDER — MOMETASONE FURO-FORMOTEROL FUM 200-5 MCG/ACT IN AERO
2.0000 | INHALATION_SPRAY | Freq: Two times a day (BID) | RESPIRATORY_TRACT | Status: DC
  Administered 2017-06-11: 2 via RESPIRATORY_TRACT
  Filled 2017-06-11: qty 8.8

## 2017-06-11 MED ORDER — ALBUTEROL SULFATE (2.5 MG/3ML) 0.083% IN NEBU: 3.0000 mL | INHALATION_SOLUTION | Freq: Four times a day (QID) | RESPIRATORY_TRACT | Status: DC | PRN

## 2017-06-11 MED ORDER — LACTATED RINGERS IV SOLN
INTRAVENOUS | Status: DC
Start: 2017-06-11 — End: 2017-06-12
  Administered 2017-06-11: 15:00:00 via INTRAVENOUS

## 2017-06-11 MED ORDER — OXYCODONE-ACETAMINOPHEN 5-325 MG PO TABS
1.0000 | ORAL_TABLET | ORAL | Status: DC | PRN
Start: 2017-06-11 — End: 2017-06-12

## 2017-06-11 MED ORDER — PHENYLEPHRINE 40 MCG/ML (10ML) SYRINGE FOR IV PUSH (FOR BLOOD PRESSURE SUPPORT)
80.0000 ug | PREFILLED_SYRINGE | INTRAVENOUS | Status: DC | PRN
  Filled 2017-06-11: qty 5

## 2017-06-11 MED ORDER — TERBUTALINE SULFATE 1 MG/ML IJ SOLN
0.2500 mg | Freq: Once | INTRAMUSCULAR | Status: DC | PRN
  Filled 2017-06-11: qty 1

## 2017-06-11 MED ORDER — SOD CITRATE-CITRIC ACID 500-334 MG/5ML PO SOLN: 30.0000 mL | ORAL | Status: DC | PRN

## 2017-06-11 MED ORDER — OXYTOCIN 40 UNITS IN LACTATED RINGERS INFUSION - SIMPLE MED
1.0000 m[IU]/min | INTRAVENOUS | Status: DC
  Administered 2017-06-11: 1 m[IU]/min via INTRAVENOUS
  Filled 2017-06-11: qty 1000

## 2017-06-11 MED ORDER — ONDANSETRON HCL 4 MG/2ML IJ SOLN: 4.0000 mg | Freq: Four times a day (QID) | INTRAMUSCULAR | Status: DC | PRN

## 2017-06-11 MED ORDER — FENTANYL 2.5 MCG/ML BUPIVACAINE 1/10 % EPIDURAL INFUSION (WH - ANES)
14.0000 mL/h | INTRAMUSCULAR | Status: DC | PRN
  Administered 2017-06-11 – 2017-06-12 (×3): 14 mL/h via EPIDURAL
  Filled 2017-06-11 (×3): qty 100

## 2017-06-11 MED ORDER — BUTORPHANOL TARTRATE 1 MG/ML IJ SOLN
1.0000 mg | INTRAMUSCULAR | Status: DC | PRN
Start: 2017-06-11 — End: 2017-06-12

## 2017-06-11 MED ORDER — ACETAMINOPHEN 325 MG PO TABS
650.0000 mg | ORAL_TABLET | ORAL | Status: AC | PRN
  Administered 2017-06-11 – 2017-06-12 (×3): 650 mg via ORAL
  Filled 2017-06-11 (×3): qty 2

## 2017-06-11 MED ORDER — PHENYLEPHRINE 40 MCG/ML (10ML) SYRINGE FOR IV PUSH (FOR BLOOD PRESSURE SUPPORT)
80.0000 ug | PREFILLED_SYRINGE | INTRAVENOUS | Status: DC | PRN
  Filled 2017-06-11: qty 5
  Filled 2017-06-11: qty 10

## 2017-06-11 MED ORDER — LIDOCAINE HCL (PF) 1 % IJ SOLN
30.0000 mL | INTRAMUSCULAR | Status: DC | PRN
  Filled 2017-06-11: qty 30

## 2017-06-11 MED ORDER — OXYCODONE-ACETAMINOPHEN 5-325 MG PO TABS: 2.0000 | ORAL_TABLET | ORAL | Status: DC | PRN

## 2017-06-11 MED ORDER — LIDOCAINE HCL (PF) 1 % IJ SOLN
INTRAMUSCULAR | Status: DC | PRN
  Administered 2017-06-11: 7 mL via EPIDURAL
  Administered 2017-06-11: 5 mL via EPIDURAL

## 2017-06-11 MED ORDER — DIPHENHYDRAMINE HCL 50 MG/ML IJ SOLN: 12.5000 mg | INTRAMUSCULAR | Status: DC | PRN

## 2017-06-11 MED ORDER — OXYTOCIN BOLUS FROM INFUSION
500.0000 mL | Freq: Once | INTRAVENOUS | Status: AC
  Administered 2017-06-12: 500 mL via INTRAVENOUS

## 2017-06-11 MED ORDER — LACTATED RINGERS IV SOLN
500.0000 mL | Freq: Once | INTRAVENOUS | Status: AC
  Administered 2017-06-11: 500 mL via INTRAVENOUS

## 2017-06-11 MED ORDER — OXYTOCIN 40 UNITS IN LACTATED RINGERS INFUSION - SIMPLE MED
2.5000 [IU]/h | INTRAVENOUS | Status: DC
  Administered 2017-06-12: 2.5 [IU]/h via INTRAVENOUS

## 2017-06-11 NOTE — Anesthesia Preprocedure Evaluation (Signed)
Anesthesia Evaluation  Patient identified by MRN, date of birth, ID band Patient awake    Reviewed: Allergy & Precautions, H&P , NPO status , Patient's Chart, lab work & pertinent test results  Airway Mallampati: III  TM Distance: >3 FB Neck ROM: full    Dental no notable dental hx. (+) Teeth Intact   Pulmonary former smoker,    Pulmonary exam normal breath sounds clear to auscultation       Cardiovascular hypertension, negative cardio ROS Normal cardiovascular exam Rhythm:regular Rate:Normal     Neuro/Psych    GI/Hepatic negative GI ROS, Neg liver ROS,   Endo/Other  Morbid obesity  Renal/GU negative Renal ROS     Musculoskeletal   Abdominal (+) + obese,   Peds  Hematology negative hematology ROS (+)   Anesthesia Other Findings   Reproductive/Obstetrics (+) Pregnancy                             Anesthesia Physical Anesthesia Plan  ASA: III  Anesthesia Plan: Epidural   Post-op Pain Management:    Induction:   PONV Risk Score and Plan:   Airway Management Planned:   Additional Equipment:   Intra-op Plan:   Post-operative Plan:   Informed Consent: I have reviewed the patients History and Physical, chart, labs and discussed the procedure including the risks, benefits and alternatives for the proposed anesthesia with the patient or authorized representative who has indicated his/her understanding and acceptance.     Plan Discussed with:   Anesthesia Plan Comments:         Anesthesia Quick Evaluation

## 2017-06-11 NOTE — Anesthesia Pain Management Evaluation Note (Signed)
  CRNA Pain Management Visit Note  Patient: [name], 31 y.o., female  "Hello I am a member of the anesthesia team at [facility]. We have an anesthesia team available at all times to provide care throughout the hospital, including epidural management and anesthesia for C-section. I don't know your plan for the delivery whether it a natural birth, water birth, IV sedation, nitrous supplementation, doula or epidural, but we want to meet your pain goals."   1.Was your pain managed to your expectations on prior hospitalizations?   Yes   2.What is your expectation for pain management during this hospitalization?     Epidural  3.How can we help you reach that goal? unsure  Record the patient's initial score and the patient's pain goal.   Pain: 0  Pain Goal: 7 [facility] wants you to be able to say your pain was always managed very well.  Casimer Lanius 06/11/2017

## 2017-06-11 NOTE — Progress Notes (Signed)
Patient ID: [name], female   DOB: 12-14-1985, 31 y.o.   MRN: [id number] Feeling some ctx Afeb, VSS, BP stable FHT- Cat II, 130s, mod variability, + accels, some variable decels, ctx q 3-4 min VE-4/50/-2, vtx, IUPC and FSE applied Continue pitocin, monitor progress
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
## 2017-06-11 NOTE — Progress Notes (Signed)
Patient ID: [name], female   DOB: 1986/03/18, 31 y.o.   MRN: [id number] Feeling some ctx Afeb, VSS, BP stable FHT- Cat I, irreg ctx on 6 mu/min pitocin VE-Foley bulb pulled out, 3-4/50/-2, vtx, AROM clear Will continue pitocin, monitor progress and BP, anticipate SVD

## 2017-06-11 NOTE — Anesthesia Procedure Notes (Signed)
Epidural Patient location during procedure: OB Start time: 06/11/2017 6:25 PM End time: 06/11/2017 6:28 PM  Staffing Anesthesiologist: Lyn Hollingshead Performed: anesthesiologist   Preanesthetic Checklist Completed: patient identified, surgical consent, pre-op evaluation, timeout performed, IV checked, risks and benefits discussed and monitors and equipment checked  Epidural Patient position: sitting Prep: site prepped and draped and DuraPrep Patient monitoring: continuous pulse ox and blood pressure Approach: midline Location: L3-L4 Injection technique: LOR air  Needle:  Needle type: Tuohy  Needle gauge: 17 G Needle length: 9 cm and 9 Needle insertion depth: 7 cm Catheter type: closed end flexible Catheter size: 19 Gauge Catheter at skin depth: 12 cm Test dose: negative and Other  Assessment Sensory level: T9 Events: blood not aspirated, injection not painful, no injection resistance, negative IV test and no paresthesia  Additional Notes Reason for block:procedure for pain

## 2017-06-12 ENCOUNTER — Encounter (HOSPITAL_COMMUNITY): Payer: Self-pay

## 2017-06-12 LAB — CBC
HCT: 34.7 % — ABNORMAL LOW (ref 36.0–46.0)
Hemoglobin: 11.8 g/dL — ABNORMAL LOW (ref 12.0–15.0)
MCH: 29.4 pg (ref 26.0–34.0)
MCHC: 34 g/dL (ref 30.0–36.0)
MCV: 86.3 fL (ref 78.0–100.0)
Platelets: 192 10*3/uL (ref 150–400)
RBC: 4.02 MIL/uL (ref 3.87–5.11)
RDW: 15.3 % (ref 11.5–15.5)
WBC: 17.8 10*3/uL — ABNORMAL HIGH (ref 4.0–10.5)

## 2017-06-12 MED ORDER — LACTATED RINGERS IV SOLN
INTRAVENOUS | Status: DC
  Administered 2017-06-12: 03:00:00 via INTRAUTERINE

## 2017-06-12 MED ORDER — BENZOCAINE-MENTHOL 20-0.5 % EX AERO
1.0000 "application " | INHALATION_SPRAY | CUTANEOUS | Status: DC | PRN
  Filled 2017-06-12: qty 56

## 2017-06-12 MED ORDER — MEASLES, MUMPS & RUBELLA VAC ~~LOC~~ INJ
0.5000 mL | INJECTION | Freq: Once | SUBCUTANEOUS | Status: DC
  Filled 2017-06-12: qty 0.5

## 2017-06-12 MED ORDER — PROPRANOLOL HCL 20 MG PO TABS
20.0000 mg | ORAL_TABLET | Freq: Two times a day (BID) | ORAL | Status: DC
  Administered 2017-06-12 – 2017-06-13 (×3): 20 mg via ORAL
  Filled 2017-06-12 (×3): qty 1

## 2017-06-12 MED ORDER — TETANUS-DIPHTH-ACELL PERTUSSIS 5-2.5-18.5 LF-MCG/0.5 IM SUSP: 0.5000 mL | Freq: Once | INTRAMUSCULAR | Status: DC

## 2017-06-12 MED ORDER — COCONUT OIL OIL: 1.0000 "application " | TOPICAL_OIL | Status: DC | PRN

## 2017-06-12 MED ORDER — ONDANSETRON HCL 4 MG/2ML IJ SOLN: 4.0000 mg | INTRAMUSCULAR | Status: DC | PRN

## 2017-06-12 MED ORDER — SIMETHICONE 80 MG PO CHEW: 80.0000 mg | CHEWABLE_TABLET | ORAL | Status: DC | PRN

## 2017-06-12 MED ORDER — PNEUMOCOCCAL VAC POLYVALENT 25 MCG/0.5ML IJ INJ
0.5000 mL | INJECTION | INTRAMUSCULAR | Status: DC
  Filled 2017-06-12: qty 0.5

## 2017-06-12 MED ORDER — MOMETASONE FURO-FORMOTEROL FUM 200-5 MCG/ACT IN AERO
2.0000 | INHALATION_SPRAY | Freq: Two times a day (BID) | RESPIRATORY_TRACT | Status: DC
  Filled 2017-06-12: qty 8.8

## 2017-06-12 MED ORDER — ACETAMINOPHEN 325 MG PO TABS
650.0000 mg | ORAL_TABLET | ORAL | Status: DC | PRN
  Administered 2017-06-12 (×2): 650 mg via ORAL
  Filled 2017-06-12 (×2): qty 2

## 2017-06-12 MED ORDER — IBUPROFEN 600 MG PO TABS
600.0000 mg | ORAL_TABLET | Freq: Four times a day (QID) | ORAL | Status: DC
  Administered 2017-06-12 – 2017-06-13 (×4): 600 mg via ORAL
  Filled 2017-06-12 (×4): qty 1

## 2017-06-12 MED ORDER — DIBUCAINE 1 % RE OINT: 1.0000 "application " | TOPICAL_OINTMENT | RECTAL | Status: DC | PRN

## 2017-06-12 MED ORDER — OXYCODONE HCL 5 MG PO TABS: 5.0000 mg | ORAL_TABLET | ORAL | Status: DC | PRN

## 2017-06-12 MED ORDER — METHYLERGONOVINE MALEATE 0.2 MG/ML IJ SOLN: 0.2000 mg | INTRAMUSCULAR | Status: DC | PRN

## 2017-06-12 MED ORDER — MAGNESIUM HYDROXIDE 400 MG/5ML PO SUSP: 30.0000 mL | ORAL | Status: DC | PRN

## 2017-06-12 MED ORDER — ONDANSETRON HCL 4 MG PO TABS: 4.0000 mg | ORAL_TABLET | ORAL | Status: DC | PRN

## 2017-06-12 MED ORDER — METHYLERGONOVINE MALEATE 0.2 MG PO TABS: 0.2000 mg | ORAL_TABLET | ORAL | Status: DC | PRN

## 2017-06-12 MED ORDER — PRENATAL MULTIVITAMIN CH
1.0000 | ORAL_TABLET | Freq: Every day | ORAL | Status: DC
  Filled 2017-06-12: qty 1

## 2017-06-12 MED ORDER — DIPHENHYDRAMINE HCL 25 MG PO CAPS
25.0000 mg | ORAL_CAPSULE | Freq: Four times a day (QID) | ORAL | Status: DC | PRN
Start: 2017-06-12 — End: 2017-06-13

## 2017-06-12 MED ORDER — ZOLPIDEM TARTRATE 5 MG PO TABS: 5.0000 mg | ORAL_TABLET | Freq: Every evening | ORAL | Status: DC | PRN

## 2017-06-12 MED ORDER — WITCH HAZEL-GLYCERIN EX PADS: 1.0000 "application " | MEDICATED_PAD | CUTANEOUS | Status: DC | PRN

## 2017-06-12 MED ORDER — OXYCODONE HCL 5 MG PO TABS: 10.0000 mg | ORAL_TABLET | ORAL | Status: DC | PRN

## 2017-06-12 MED ORDER — ALBUTEROL SULFATE (2.5 MG/3ML) 0.083% IN NEBU: 3.0000 mL | INHALATION_SOLUTION | Freq: Four times a day (QID) | RESPIRATORY_TRACT | Status: DC | PRN

## 2017-06-12 MED ORDER — SENNOSIDES-DOCUSATE SODIUM 8.6-50 MG PO TABS
2.0000 | ORAL_TABLET | ORAL | Status: DC
  Administered 2017-06-13: 2 via ORAL
  Filled 2017-06-12: qty 2

## 2017-06-12 NOTE — Progress Notes (Signed)
UR chart review completed.  

## 2017-06-12 NOTE — Anesthesia Postprocedure Evaluation (Signed)
Anesthesia Post Note  Patient: [name]  Procedure(s) Performed: * No procedures listed *     Patient location during evaluation: Mother Baby Anesthesia Type: Epidural Level of consciousness: awake Pain management: pain level controlled Vital Signs Assessment: post-procedure vital signs reviewed and stable Respiratory status: spontaneous breathing Cardiovascular status: stable Postop Assessment: no headache, no backache, epidural receding and patient able to bend at knees Anesthetic complications: no    Last Vitals:  Vitals:   06/12/17 0945 06/12/17 1340  BP: 139/76 108/72  Pulse: 87 86  Resp:  18  Temp: 36.5 C   SpO2: 98% 98%    Last Pain:  Vitals:   06/12/17 1130  TempSrc:   PainSc: 7    Pain Goal: Patients Stated Pain Goal: 2 (06/12/17 1130)               Everette Rank

## 2017-06-12 NOTE — Progress Notes (Signed)
Patient ID: [name], female   DOB: [birth year], 31 y.o.   MRN: [id number] Pt is doing well, bonding with baby and breastfeeding.She has no complaints. Lochia mild. Pain well controlled PPD#0 s/p svd - stable Routine pp care

## 2017-06-13 MED ORDER — IBUPROFEN 600 MG PO TABS: 600.0000 mg | ORAL_TABLET | Freq: Four times a day (QID) | ORAL | 1 refills | Status: DC | PRN

## 2017-06-13 MED ORDER — PRENATAL MULTIVITAMIN CH: 1.0000 | ORAL_TABLET | Freq: Every day | ORAL | 3 refills | Status: DC

## 2017-06-13 NOTE — Progress Notes (Addendum)
Post Partum Day 1 Subjective: no complaints, up ad lib, voiding, tolerating PO and nl lochia, pain controlled  Objective: Blood pressure (!) 118/54, pulse 67, temperature 98 F (36.7 C), temperature source Oral, resp. rate 18, height 5\' 5"  (1.651 m), weight (!) 141.5 kg (312 lb), last menstrual period 08/18/2016, SpO2 100 %, unknown if currently breastfeeding.  Physical Exam:  General: alert and no distress Lochia: appropriate Uterine Fundus: firm   Recent Labs  06/11/17 1356 06/12/17 0845  HGB 12.1 11.8*  HCT 36.1 34.7*    Assessment/Plan: Plan for discharge tomorrow or today, Breastfeeding and Lactation consult.  D/C with motrin and PNV.  F/u 6 weeks.     LOS: 2 days   Triana Coover Bovard-Stuckert 06/13/2017, 8:00 AM

## 2017-06-13 NOTE — Lactation Note (Signed)
This note was copied from a baby's chart. Lactation Consultation Note  Patient Name: [name] [id number] Date: 06/13/2017 Reason for consult: Initial assessment Baby at 27 hr of life. Mom desires to pump to feed only. She had the DEBP set up in the room but is was not plugged in and she stated she had not used it today. Reviewed breast changes, supply/demand, paced bottle feeding, milk handling/storage, and hands on pumping. Encouraged mom to use the DEBP 8-12x/24hr and contact [facility] today about a DEBP for home use because all she currently has is a Metallurgist. Given lactation handouts. Aware of OP services and support group. Parents will call as needed.    Maternal Data Has patient been taught Hand Expression?: Yes (per mom)  Feeding    LATCH Score                   Interventions    Lactation Tools Discussed/Used WIC Program: Yes Pump Review: Setup, frequency, and cleaning;Milk Storage;Other (comment) (pump settings)   Consult Status Consult Status: Complete    Denzil Hughes 06/13/2017, 9:56 AM

## 2017-06-13 NOTE — Progress Notes (Signed)
CSW attempted to meet with CSW, however, [name] was discharged prior to CSW completing assessment. CSW will monitor infant's CDS and will make a report if warranted.  Laurey Arrow, MSW, LCSW Clinical Social Work [phone]

## 2017-06-13 NOTE — Discharge Summary (Signed)
OB Discharge Summary     Patient Name: [name] DOB: [birth year] MRN: [id number]  Date of admission: 06/11/2017 Delivering MD: Willis Modena, TODD   Date of discharge: 06/13/2017  Admitting diagnosis: INDUCTION Intrauterine pregnancy: [redacted]w[redacted]d     Secondary diagnosis:  Active Problems:   Hypertension complicating pregnancy   SVD (spontaneous vaginal delivery)  Additional problems: asthma     Discharge diagnosis: Term Pregnancy Delivered                                                                                                Post partum procedures:none  Augmentation: AROM, Pitocin and Foley Balloon  Complications: None  Hospital course:  Induction of Labor With Vaginal Delivery   31 y.o. yo [id number] at [redacted]w[redacted]d was admitted to the hospital 06/11/2017 for induction of labor.  Indication for induction: Gestational hypertension.  Patient had an uncomplicated labor course as follows: Membrane Rupture Time/Date: 1:32 PM ,06/11/2017   Intrapartum Procedures: Episiotomy: None [1]                                         Lacerations:  None [1]  Patient had delivery of a Viable infant.  Information for the patient's newborn:  Larkyn, Greenberger [[id number]]  Delivery Method: Vaginal, Spontaneous Delivery (Filed from Delivery Summary)   06/12/2017  Details of delivery can be found in separate delivery note.  Patient had a routine postpartum course. Patient is discharged home 06/13/17.  Physical exam  Vitals:   06/12/17 1340 06/12/17 1745 06/12/17 2200 06/13/17 0607  BP: 108/72 127/77 (!) 123/56 (!) 118/54  Pulse: 86 85 85 67  Resp: 18 18 20 18   Temp:  98.3 F (36.8 C) 98 F (36.7 C) 98 F (36.7 C)  TempSrc:  Oral  Oral  SpO2: 98%  100%   Weight:      Height:       General: alert and no distress Lochia: appropriate Uterine Fundus: firm  Labs: Lab Results  Component Value Date   WBC 17.8 (H) 06/12/2017   HGB 11.8 (L) 06/12/2017   HCT 34.7 (L) 06/12/2017   MCV 86.3  06/12/2017   PLT 192 06/12/2017   CMP Latest Ref Rng & Units 06/11/2017  Glucose 65 - 99 mg/dL 97  BUN 6 - 20 mg/dL 8  Creatinine 0.44 - 1.00 mg/dL 0.59  Sodium 135 - 145 mmol/L 137  Potassium 3.5 - 5.1 mmol/L 4.1  Chloride 101 - 111 mmol/L 109  CO2 22 - 32 mmol/L 19(L)  Calcium 8.9 - 10.3 mg/dL 9.4  Total Protein 6.5 - 8.1 g/dL 6.1(L)  Total Bilirubin 0.3 - 1.2 mg/dL 0.8  Alkaline Phos 38 - 126 U/L 141(H)  AST 15 - 41 U/L 23  ALT 14 - 54 U/L 13(L)    Discharge instruction: per After Visit Summary and "Baby and Me Booklet".  After visit meds:  Allergies as of 06/13/2017      Reactions   Pineapple Swelling   Strawberry Extract Swelling  Divalproex Sodium Hives, Rash   Haldol [haloperidol] Palpitations, Rash   "difficulty breathing"      Medication List    STOP taking these medications   Doxylamine-Pyridoxine 10-62 MG Tbec   folic acid 694 MCG tablet Commonly known as:  FOLVITE   predniSONE 20 MG tablet Commonly known as:  DELTASONE     TAKE these medications   acetaminophen 325 MG tablet Commonly known as:  TYLENOL Take 650 mg by mouth every 6 (six) hours as needed for mild pain.   albuterol 108 (90 Base) MCG/ACT inhaler Commonly known as:  PROVENTIL HFA;VENTOLIN HFA Inhale 2 puffs into the lungs every 6 (six) hours as needed for wheezing or shortness of breath.   COMFORT FIT MATERNITY SUPP LG Misc 1 Units by Does not apply route daily.   diphenhydrAMINE 25 mg capsule Commonly known as:  BENADRYL Take 1 capsule (25 mg total) by mouth every 6 (six) hours as needed.   fluticasone-salmeterol 115-21 MCG/ACT inhaler Commonly known as:  ADVAIR HFA Inhale 2 puffs into the lungs 2 (two) times daily.   ibuprofen 600 MG tablet Commonly known as:  ADVIL,MOTRIN Take 1 tablet (600 mg total) by mouth every 6 (six) hours as needed.   prenatal multivitamin Tabs tablet Take 1 tablet by mouth daily at 12 noon.   propranolol 20 MG tablet Commonly known as:   INDERAL Take 20 mg by mouth 2 (two) times daily.            Discharge Care Instructions        Start     Ordered   06/13/17 0000  ibuprofen (ADVIL,MOTRIN) 600 MG tablet  Every 6 hours PRN     06/13/17 0917   06/13/17 0000  Prenatal Vit-Fe Fumarate-FA (PRENATAL MULTIVITAMIN) TABS tablet  Daily     06/13/17 0917   06/13/17 0000  Increase activity slowly     06/13/17 0917   06/13/17 0000  Diet - low sodium heart healthy     06/13/17 0917   06/13/17 0000  Discharge instructions    Comments:  Call [phone] with questions or problems   06/13/17 0917   06/13/17 0000  May walk up steps     06/13/17 0917   06/13/17 0000  May shower / Bathe     06/13/17 0917   06/13/17 0000  Driving Restrictions    Comments:  While taking strong pain medicine   06/13/17 0917   06/13/17 0000  Sexual Activity Restrictions    Comments:  Pelvic rest - no douching, tampons or sex for 6 weeks   06/13/17 0917   06/13/17 0000  Lifting restrictions    Comments:  No greater than 10-15 lbs for 2-3 weeks   06/13/17 0917   06/13/17 0000  Call MD for:  persistant nausea and vomiting     06/13/17 0917   06/13/17 0000  Call MD for:  severe uncontrolled pain     06/13/17 0917   06/11/17 0000  OB RESULT CONSOLE Group B Strep    Comments:  This external order was created through the Results Console.   06/11/17 0742      Diet: routine diet  Activity: Advance as tolerated. Pelvic rest for 6 weeks.   Outpatient follow up:6 weeks Follow up Appt:No future appointments. Follow up Visit:No Follow-up on file.  Postpartum contraception: Undecided  Newborn Data: Live born female  Birth Weight: 7 lb 9.7 oz (3450 g) APGAR: 9, 9  Baby Feeding: Breast Disposition:home with mother  06/13/2017 Janyth Contes, MD

## 2017-06-17 ENCOUNTER — Encounter (HOSPITAL_COMMUNITY): Payer: Self-pay | Admitting: Radiology

## 2017-06-17 ENCOUNTER — Emergency Department (HOSPITAL_BASED_OUTPATIENT_CLINIC_OR_DEPARTMENT_OTHER): Payer: Medicare HMO

## 2017-06-17 ENCOUNTER — Emergency Department (HOSPITAL_COMMUNITY): Payer: Medicare HMO

## 2017-06-17 ENCOUNTER — Inpatient Hospital Stay (HOSPITAL_COMMUNITY)
Admission: EM | Admit: 2017-06-17 | Discharge: 2017-06-19 | DRG: 774 | Disposition: A | Discharge status: Home / Self Care | Payer: Medicare HMO | Attending: Cardiology | Admitting: Cardiology

## 2017-06-17 ENCOUNTER — Other Ambulatory Visit: Payer: Self-pay | Admitting: *Deleted

## 2017-06-17 DIAGNOSIS — J45909 Unspecified asthma, uncomplicated: Secondary | ICD-10-CM | POA: Diagnosis present

## 2017-06-17 DIAGNOSIS — I361 Nonrheumatic tricuspid (valve) insufficiency: Secondary | ICD-10-CM | POA: Diagnosis not present

## 2017-06-17 DIAGNOSIS — O99345 Other mental disorders complicating the puerperium: Secondary | ICD-10-CM | POA: Diagnosis present

## 2017-06-17 DIAGNOSIS — Z833 Family history of diabetes mellitus: Secondary | ICD-10-CM

## 2017-06-17 DIAGNOSIS — O9081 Anemia of the puerperium: Secondary | ICD-10-CM | POA: Diagnosis present

## 2017-06-17 DIAGNOSIS — F319 Bipolar disorder, unspecified: Secondary | ICD-10-CM | POA: Diagnosis present

## 2017-06-17 DIAGNOSIS — G4733 Obstructive sleep apnea (adult) (pediatric): Secondary | ICD-10-CM | POA: Diagnosis not present

## 2017-06-17 DIAGNOSIS — Z888 Allergy status to other drugs, medicaments and biological substances status: Secondary | ICD-10-CM

## 2017-06-17 DIAGNOSIS — O9089 Other complications of the puerperium, not elsewhere classified: Secondary | ICD-10-CM | POA: Diagnosis not present

## 2017-06-17 DIAGNOSIS — Z800 Family history of malignant neoplasm of digestive organs: Secondary | ICD-10-CM

## 2017-06-17 DIAGNOSIS — I5031 Acute diastolic (congestive) heart failure: Secondary | ICD-10-CM | POA: Diagnosis present

## 2017-06-17 DIAGNOSIS — I10 Essential (primary) hypertension: Secondary | ICD-10-CM | POA: Diagnosis present

## 2017-06-17 DIAGNOSIS — Z6841 Body Mass Index (BMI) 40.0 and over, adult: Secondary | ICD-10-CM | POA: Diagnosis not present

## 2017-06-17 DIAGNOSIS — Z7951 Long term (current) use of inhaled steroids: Secondary | ICD-10-CM

## 2017-06-17 DIAGNOSIS — D649 Anemia, unspecified: Secondary | ICD-10-CM | POA: Diagnosis present

## 2017-06-17 DIAGNOSIS — O9952 Diseases of the respiratory system complicating childbirth: Secondary | ICD-10-CM | POA: Diagnosis present

## 2017-06-17 DIAGNOSIS — Z91018 Allergy to other foods: Secondary | ICD-10-CM

## 2017-06-17 DIAGNOSIS — Z8249 Family history of ischemic heart disease and other diseases of the circulatory system: Secondary | ICD-10-CM

## 2017-06-17 DIAGNOSIS — I110 Hypertensive heart disease with heart failure: Secondary | ICD-10-CM | POA: Diagnosis not present

## 2017-06-17 DIAGNOSIS — Z803 Family history of malignant neoplasm of breast: Secondary | ICD-10-CM | POA: Diagnosis not present

## 2017-06-17 DIAGNOSIS — O164 Unspecified maternal hypertension, complicating childbirth: Secondary | ICD-10-CM

## 2017-06-17 DIAGNOSIS — I509 Heart failure, unspecified: Secondary | ICD-10-CM | POA: Diagnosis not present

## 2017-06-17 DIAGNOSIS — R0602 Shortness of breath: Secondary | ICD-10-CM | POA: Diagnosis not present

## 2017-06-17 DIAGNOSIS — O139 Gestational [pregnancy-induced] hypertension without significant proteinuria, unspecified trimester: Secondary | ICD-10-CM | POA: Diagnosis not present

## 2017-06-17 DIAGNOSIS — Z87891 Personal history of nicotine dependence: Secondary | ICD-10-CM

## 2017-06-17 DIAGNOSIS — E8770 Fluid overload, unspecified: Secondary | ICD-10-CM

## 2017-06-17 DIAGNOSIS — O163 Unspecified maternal hypertension, third trimester: Secondary | ICD-10-CM | POA: Diagnosis not present

## 2017-06-17 HISTORY — DX: Anxiety disorder, unspecified: F41.9

## 2017-06-17 LAB — CBC
HCT: 34.4 % — ABNORMAL LOW (ref 36.0–46.0)
Hemoglobin: 11 g/dL — ABNORMAL LOW (ref 12.0–15.0)
MCH: 28 pg (ref 26.0–34.0)
MCHC: 32 g/dL (ref 30.0–36.0)
MCV: 87.5 fL (ref 78.0–100.0)
Platelets: 241 10*3/uL (ref 150–400)
RBC: 3.93 MIL/uL (ref 3.87–5.11)
RDW: 14.9 % (ref 11.5–15.5)
WBC: 6 10*3/uL (ref 4.0–10.5)

## 2017-06-17 LAB — COMPREHENSIVE METABOLIC PANEL
ALT: 28 U/L (ref 14–54)
AST: 29 U/L (ref 15–41)
Albumin: 2.9 g/dL — ABNORMAL LOW (ref 3.5–5.0)
Alkaline Phosphatase: 103 U/L (ref 38–126)
Anion gap: 4 — ABNORMAL LOW (ref 5–15)
BUN: 8 mg/dL (ref 6–20)
CO2: 28 mmol/L (ref 22–32)
Calcium: 9.1 mg/dL (ref 8.9–10.3)
Chloride: 109 mmol/L (ref 101–111)
Creatinine, Ser: 0.79 mg/dL (ref 0.44–1.00)
GFR calc Af Amer: >60 mL/min (ref >60)
GFR calc non Af Amer: >60 mL/min (ref >60)
Glucose, Bld: 96 mg/dL (ref 65–99)
Potassium: 4.1 mmol/L (ref 3.5–5.1)
Sodium: 141 mmol/L (ref 135–145)
Total Bilirubin: 0.6 mg/dL (ref 0.3–1.2)
Total Protein: 6 g/dL — ABNORMAL LOW (ref 6.5–8.1)

## 2017-06-17 LAB — I-STAT TROPONIN, ED: Troponin i, poc: 0 ng/mL (ref 0.00–0.08)

## 2017-06-17 LAB — BRAIN NATRIURETIC PEPTIDE: B Natriuretic Peptide: 186.6 pg/mL — ABNORMAL HIGH (ref 0.0–100.0)

## 2017-06-17 LAB — ECHOCARDIOGRAM COMPLETE

## 2017-06-17 LAB — D-DIMER, QUANTITATIVE: D-Dimer, Quant: 2.71 ug/mL-FEU — ABNORMAL HIGH (ref 0.00–0.50)

## 2017-06-17 MED ORDER — PROPRANOLOL HCL 20 MG PO TABS: 20.0000 mg | ORAL_TABLET | Freq: Two times a day (BID) | ORAL | Status: DC

## 2017-06-17 MED ORDER — AMLODIPINE BESYLATE 2.5 MG PO TABS
2.5000 mg | ORAL_TABLET | Freq: Every day | ORAL | Status: DC
  Administered 2017-06-17 – 2017-06-18 (×2): 2.5 mg via ORAL
  Filled 2017-06-17 (×3): qty 1

## 2017-06-17 MED ORDER — ACETAMINOPHEN 325 MG PO TABS
650.0000 mg | ORAL_TABLET | Freq: Four times a day (QID) | ORAL | Status: DC | PRN
  Administered 2017-06-18 – 2017-06-19 (×4): 650 mg via ORAL
  Filled 2017-06-17 (×5): qty 2

## 2017-06-17 MED ORDER — IOPAMIDOL (ISOVUE-370) INJECTION 76%
INTRAVENOUS | Status: AC
  Administered 2017-06-17: 80 mL
  Filled 2017-06-17: qty 100

## 2017-06-17 MED ORDER — FUROSEMIDE 10 MG/ML IJ SOLN
40.0000 mg | Freq: Two times a day (BID) | INTRAMUSCULAR | Status: DC
  Administered 2017-06-17 – 2017-06-18 (×3): 40 mg via INTRAVENOUS
  Filled 2017-06-17 (×4): qty 4

## 2017-06-17 MED ORDER — ALBUTEROL SULFATE (2.5 MG/3ML) 0.083% IN NEBU: 2.5000 mg | INHALATION_SOLUTION | Freq: Four times a day (QID) | RESPIRATORY_TRACT | Status: DC | PRN

## 2017-06-17 MED ORDER — MOMETASONE FURO-FORMOTEROL FUM 200-5 MCG/ACT IN AERO
2.0000 | INHALATION_SPRAY | Freq: Two times a day (BID) | RESPIRATORY_TRACT | Status: DC
  Administered 2017-06-18 – 2017-06-19 (×2): 2 via RESPIRATORY_TRACT
  Filled 2017-06-17: qty 8.8

## 2017-06-17 NOTE — ED Notes (Signed)
Heart healthy diet ordered. 

## 2017-06-17 NOTE — ED Notes (Signed)
Per lab the d-dimer is clotted and needs to be redrawn

## 2017-06-17 NOTE — ED Notes (Signed)
Pt returned from ct

## 2017-06-17 NOTE — ED Notes (Signed)
Echo completed

## 2017-06-17 NOTE — Progress Notes (Signed)
  Echocardiogram 2D Echocardiogram has been performed.  Jennette Dubin 06/17/2017, 3:34 PM

## 2017-06-17 NOTE — ED Notes (Signed)
Walked patient to the bathroom  

## 2017-06-17 NOTE — ED Notes (Signed)
Pt called out stating her chest felt like it was "fluttering", pt denies pain or SHOB. EKG done, sinus rhythm noted, HR 64bpm, nad. Admitting provider to be paged
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
## 2017-06-17 NOTE — ED Notes (Signed)
Echo at bedside

## 2017-06-17 NOTE — ED Notes (Signed)
Urine sample in mini lab

## 2017-06-17 NOTE — Progress Notes (Signed)
Pt. Arrived to the unit via stretcher from the ED in alert and stable condition. Pts. Dad at bedside. Pt. Emotional. As she recently gave birth and expressed missing her daughter. Emotional support provided by RN. Pt. Oriented to room and placed on telemetry. CCMD notified of pts. Arrival to the unit.  Call light placed within reach and BSC at bed side. RN will continue to monitor pt. For changes in condition. [name], Katherine Roan
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
## 2017-06-17 NOTE — ED Triage Notes (Signed)
Pt arrives via POv from home, reports just gave birth 5 days ago. Reports went home feeling well, then began having SOB and chest tightness. Pt states pain and SOB worsened over the last couple days. Pt denies large amounts of vaginal bleeding. Pt tearful in triage.

## 2017-06-17 NOTE — ED Notes (Signed)
Juiced provided

## 2017-06-18 ENCOUNTER — Telehealth (HOSPITAL_COMMUNITY): Payer: Self-pay

## 2017-06-18 DIAGNOSIS — O139 Gestational [pregnancy-induced] hypertension without significant proteinuria, unspecified trimester: Secondary | ICD-10-CM | POA: Diagnosis not present

## 2017-06-18 DIAGNOSIS — Z8249 Family history of ischemic heart disease and other diseases of the circulatory system: Secondary | ICD-10-CM | POA: Diagnosis not present

## 2017-06-18 DIAGNOSIS — O99345 Other mental disorders complicating the puerperium: Secondary | ICD-10-CM | POA: Diagnosis present

## 2017-06-18 DIAGNOSIS — E8770 Fluid overload, unspecified: Secondary | ICD-10-CM | POA: Diagnosis present

## 2017-06-18 DIAGNOSIS — Z87891 Personal history of nicotine dependence: Secondary | ICD-10-CM | POA: Diagnosis not present

## 2017-06-18 DIAGNOSIS — O9081 Anemia of the puerperium: Secondary | ICD-10-CM | POA: Diagnosis present

## 2017-06-18 DIAGNOSIS — F319 Bipolar disorder, unspecified: Secondary | ICD-10-CM | POA: Diagnosis present

## 2017-06-18 DIAGNOSIS — R0602 Shortness of breath: Secondary | ICD-10-CM | POA: Diagnosis present

## 2017-06-18 DIAGNOSIS — Z91018 Allergy to other foods: Secondary | ICD-10-CM | POA: Diagnosis not present

## 2017-06-18 DIAGNOSIS — Z800 Family history of malignant neoplasm of digestive organs: Secondary | ICD-10-CM | POA: Diagnosis not present

## 2017-06-18 DIAGNOSIS — O9952 Diseases of the respiratory system complicating childbirth: Secondary | ICD-10-CM | POA: Diagnosis present

## 2017-06-18 DIAGNOSIS — I10 Essential (primary) hypertension: Secondary | ICD-10-CM | POA: Diagnosis present

## 2017-06-18 DIAGNOSIS — Z888 Allergy status to other drugs, medicaments and biological substances status: Secondary | ICD-10-CM | POA: Diagnosis not present

## 2017-06-18 DIAGNOSIS — J45909 Unspecified asthma, uncomplicated: Secondary | ICD-10-CM | POA: Diagnosis present

## 2017-06-18 DIAGNOSIS — O9089 Other complications of the puerperium, not elsewhere classified: Secondary | ICD-10-CM | POA: Diagnosis present

## 2017-06-18 DIAGNOSIS — Z803 Family history of malignant neoplasm of breast: Secondary | ICD-10-CM | POA: Diagnosis not present

## 2017-06-18 DIAGNOSIS — Z6841 Body Mass Index (BMI) 40.0 and over, adult: Secondary | ICD-10-CM

## 2017-06-18 DIAGNOSIS — Z7951 Long term (current) use of inhaled steroids: Secondary | ICD-10-CM | POA: Diagnosis not present

## 2017-06-18 DIAGNOSIS — G4733 Obstructive sleep apnea (adult) (pediatric): Secondary | ICD-10-CM | POA: Diagnosis present

## 2017-06-18 DIAGNOSIS — Z833 Family history of diabetes mellitus: Secondary | ICD-10-CM | POA: Diagnosis not present

## 2017-06-18 DIAGNOSIS — I5031 Acute diastolic (congestive) heart failure: Secondary | ICD-10-CM | POA: Diagnosis not present

## 2017-06-18 DIAGNOSIS — O163 Unspecified maternal hypertension, third trimester: Secondary | ICD-10-CM | POA: Diagnosis not present

## 2017-06-18 DIAGNOSIS — D649 Anemia, unspecified: Secondary | ICD-10-CM | POA: Diagnosis present

## 2017-06-18 LAB — BASIC METABOLIC PANEL
Anion gap: 8 (ref 5–15)
BUN: 6 mg/dL (ref 6–20)
CO2: 26 mmol/L (ref 22–32)
Calcium: 9 mg/dL (ref 8.9–10.3)
Chloride: 107 mmol/L (ref 101–111)
Creatinine, Ser: 0.78 mg/dL (ref 0.44–1.00)
GFR calc Af Amer: >60 mL/min (ref >60)
GFR calc non Af Amer: >60 mL/min (ref >60)
Glucose, Bld: 99 mg/dL (ref 65–99)
Potassium: 3.4 mmol/L — ABNORMAL LOW (ref 3.5–5.1)
Sodium: 141 mmol/L (ref 135–145)

## 2017-06-18 LAB — CBC
HCT: 35.1 % — ABNORMAL LOW (ref 36.0–46.0)
Hemoglobin: 11.3 g/dL — ABNORMAL LOW (ref 12.0–15.0)
MCH: 28.1 pg (ref 26.0–34.0)
MCHC: 32.2 g/dL (ref 30.0–36.0)
MCV: 87.3 fL (ref 78.0–100.0)
Platelets: 269 10*3/uL (ref 150–400)
RBC: 4.02 MIL/uL (ref 3.87–5.11)
RDW: 14.9 % (ref 11.5–15.5)
WBC: 7.6 10*3/uL (ref 4.0–10.5)

## 2017-06-18 MED ORDER — POTASSIUM CHLORIDE CRYS ER 20 MEQ PO TBCR
40.0000 meq | EXTENDED_RELEASE_TABLET | Freq: Once | ORAL | Status: AC
Start: 2017-06-18 — End: 2017-06-18
  Administered 2017-06-18: 40 meq via ORAL
  Filled 2017-06-18 (×2): qty 2

## 2017-06-18 NOTE — Progress Notes (Signed)
Progress Note  Patient Name: [name] Date of Encounter: 06/18/2017  Primary Cardiologist: new/ Meda Coffee  Subjective   The patient feels slightly better today but not yet to baseline, she has headache this morning.   Inpatient Medications    Scheduled Meds: . amLODipine  2.5 mg Oral Daily  . furosemide  40 mg Intravenous BID  . mometasone-formoterol  2 puff Inhalation BID   Continuous Infusions:  PRN Meds: acetaminophen, albuterol   Vital Signs    Vitals:   06/17/17 2015 06/17/17 2115 06/18/17 0049 06/18/17 0354  BP:  132/79 131/66 (!) 118/57  Pulse: 72 79 77 71  Resp:  20 20 18   Temp:  98.4 F (36.9 C) 98.4 F (36.9 C) 98.6 F (37 C)  TempSrc:  Oral Oral Oral  SpO2: 96% 99% 98% 100%  Weight:  (!) 300 lb 3.2 oz (136.2 kg)  298 lb (135.2 kg)  Height:  5\' 5"  (1.651 m)      Intake/Output Summary (Last 24 hours) at 06/18/17 0840 Last data filed at 06/18/17 0355  Gross per 24 hour  Intake              240 ml  Output             3450 ml  Net            -3210 ml   Filed Weights   06/17/17 2115 06/18/17 0354  Weight: (!) 300 lb 3.2 oz (136.2 kg) 298 lb (135.2 kg)   Telemetry    SR - Personally Reviewed  ECG    SR, normal ECG - Personally Reviewed  Physical Exam  GEN: No acute distress.   Neck: No JVD Cardiac: RRR, no murmurs, rubs, or gallops.  Respiratory: Clear to auscultation bilaterally. GI: Soft, nontender, non-distended  MS: No edema; No deformity. Neuro:  Nonfocal  Psych: Normal affect   Labs    Chemistry Recent Labs Lab 06/17/17 0928 06/18/17 0440  NA 141 141  K 4.1 3.4*  CL 109 107  CO2 28 26  GLUCOSE 96 99  BUN 8 6  CREATININE 0.79 0.78  CALCIUM 9.1 9.0  PROT 6.0*  --   ALBUMIN 2.9*  --   AST 29  --   ALT 28  --   ALKPHOS 103  --   BILITOT 0.6  --   GFRNONAA >60 >60  GFRAA >60 >60  ANIONGAP 4* 8    Hematology Recent Labs Lab 06/12/17 0845 06/17/17 0950 06/18/17 0440  WBC 17.8* 6.0 7.6  RBC 4.02 3.93 4.02    HGB 11.8* 11.0* 11.3*  HCT 34.7* 34.4* 35.1*  MCV 86.3 87.5 87.3  MCH 29.4 28.0 28.1  MCHC 34.0 32.0 32.2  RDW 15.3 14.9 14.9  PLT 192 241 269   Cardiac EnzymesNo results for input(s): TROPONINI in the last 168 hours.  Recent Labs Lab 06/17/17 1000  TROPIPOC 0.00    BNP Recent Labs Lab 06/17/17 0950  BNP 186.6*    DDimer  Recent Labs Lab 06/17/17 1126  DDIMER 2.71*    Radiology    Dg Chest 2 View  Result Date: 06/17/2017 CLINICAL DATA:  Shortness of breath and lightheadedness this morning. Patient is 4 days postpartum. History of cardiomegaly, palpitations, hypertension, former smoker. EXAM: CHEST  2 VIEW COMPARISON:  Chest x-ray of July 25, 2016 FINDINGS: The lungs are adequately inflated. There is no focal infiltrate. The cardiac silhouette is mildly enlarged. The pulmonary vascularity is engorged. There is no  pleural effusion or pneumothorax. The bony thorax exhibits no acute abnormality. IMPRESSION: Mild cardiomegaly with pulmonary vascular congestion may reflect low-grade CHF. There is no alveolar pneumonia nor pleural effusion. Electronically Signed   By: David  Martinique M.D.   On: 06/17/2017 10:50   Ct Angio Chest Pe W And/or Wo Contrast  Result Date: 06/17/2017 CLINICAL DATA:  Chest pain and shortness of breath beginning a few days ago. 4 days postpartum. EXAM: CT ANGIOGRAPHY CHEST WITH CONTRAST TECHNIQUE: Multidetector CT imaging of the chest was performed using the standard protocol during bolus administration of intravenous contrast. Multiplanar CT image reconstructions and MIPs were obtained to evaluate the vascular anatomy. CONTRAST:  80 cc Isovue 370 intravenous COMPARISON:  Chest x-ray from earlier today.  Chest CT 08/17/2007 FINDINGS: Cardiovascular: Satisfactory opacification of the pulmonary arteries to the segmental level. No evidence of pulmonary embolism. Borderline cardiomegaly. No pericardial effusion. Mediastinum/Nodes: Negative for adenopathy .  Lungs/Pleura: Generalized septal thickening at the bases with trace pleural effusions. Generalized airway cuffing. No consolidation. No pneumothorax. Upper Abdomen: No acute finding Musculoskeletal: No acute or aggressive finding Review of the MIP images confirms the above findings. IMPRESSION: 1. Mild CHF. 2. Negative for pulmonary embolism. Electronically Signed   By: Monte Fantasia M.D.   On: 06/17/2017 13:42   Cardiac Studies   TTE: 06/17/17 - Left ventricle: The cavity size was normal. Wall thickness was   normal. Systolic function was normal. The estimated ejection   fraction was in the range of 55% to 60%.    Patient Profile     31 y.o. female 6 days postpartum  Assessment & Plan    1. CHF  A very pleasant 31 year old female who is 5 days postpartum with her second child. The patient has no prior medical history of heart disease, she delivered a healthy baby girl 8 years ago with no complications. These second baby was overdue and she was admitted for induction on September 4 at 7 AM and didn't deliver until 24 hour later. She has received IV fluids during the entire time. After she went home she felt on and off shortness of breath, no chest pain and occasional palpitations. She denies any paroxysmal nocturnal dyspnea and sleeps on 2 pillows but that's chronic. She has minimal lower extremity edema that hasn't changed. On physical exam blood pressure is 469 systolic, she has no S3 or S4, she has crackles in her lung bases and mild lower extremity edema. Her labs show normal creatinine BNP of 186, elevated d-dimer, her EKG is normal, chest x-rays consistent with pulmonary edema, CT chest shows no pulmonary embolism and I have personally reviewed her CT her coronaries have normal origin.  Echocardiogram performed bedside showed normal LVEF 60-65%, with global longitudinal strain of -20% that represents a normal value.  This most probably represents acute heart failure secondary to  fluid overload during prolonged induced labor as well as fluid shifts in the postpartum period. The patient is advised to pump and dump in the next 24 hours until she washes out iodine from CT chest, that will allow [place] to use a few doses of Lasix, afterwards she is advised to continue breast-feeding as it will help her to diurese. I will switch propranolol to amlodipine 2.5 mg daily.  BP is controlled today, she has diuresed 3.2 L of fluids with some improvement, she is able to start nursing today (acceptable with lasix). Crea stable, anticipated discharge tomorrow.   For questions or updates, please contact [facility] Please  consult [email or web address] for contact info under Cardiology/STEMI.     Signed, Ena Dawley, MD  06/18/2017, 8:40 AM

## 2017-06-18 NOTE — Addendum Note (Signed)
Addended by: Gabriel Cirri on: 06/18/2017 12:07 PM   Modules accepted: Orders

## 2017-06-18 NOTE — Patient Outreach (Addendum)
[facility]) Care Management  06/17/17  [name] 06/04/1986 [id number]  Transition of Care Referral  Referral Date: 06/14/17 Referral Source: [employer] Referral Date of Discharge: 06/13/17 Facility: [facility] Discharge Diagnosis: Induction, HTN complicating pregnancy Insurance: [employer]  Outreach attempt # 1 to patient and HIPAA verified. Patient is currently in the ED at [facility]. She is having trouble breathing. She believes it is related to fluid overload or asthma. Patient reported, she gave birth to a baby girl on 06/12/17. Patient was induced due to complications with HTN. Shes had multiple admissions and ED visits in 2018.  Patient lives with her husband and daughter. She is independent with ADLs.  Patient has a new diagnosis of heart failure, per EMR. She was admitted to [facility] on 06/17/17.  Plan: RN CM will send referral to [facility] RN for further in home eval/assessment of care needs and management of newly diagnosed heart failure (noted in patient's chart after conversation).  Lake Bells, RN, BSN, MHA/MSL, [place] Telephonic Care Manager Coordinator [facility] Direct Phone: [phone] Toll Free: [phone] Fax: [phone]

## 2017-06-18 NOTE — Telephone Encounter (Signed)
Lc received call regarding this post partum mom now in heart failure, mom has been pumping and dumping per orders from CT yesterday now 24 hours ago.  LC called mom to discuss current meds in relation to lactation.  Mom is taking Norvasc according to [employer] "Medications & Mother's Milk" this is an L3 with limited data probably compatible and the preferred medication for this drug class.  Mom reports worries and will stop breastfeeding. Lc encouraged mom to discuss with peds as she desires.  LC discussed how to "dry up" her milk supply for comfort.

## 2017-06-18 NOTE — Consult Note (Signed)
   [facility] CM Inpatient Consult   06/18/2017  [name] 1985-11-19 [id number]   Patient was assessed for [facility] community care management [facility] Medicare [place].  Patient is currently active with [place] nurse prior to admission in the high risk list.  Patient recently discharge and re-hospitalized with now with Congestive Heart Failure  and made RN Community Nurse aware of patient's hospitalization this morning. Met with the patient and she states, "my husband" and another lady who did not introduce herself but the patient gave permission to speak in front of them regarding [place] Management program.  [facility] Care management for resources and post hospital follow up. Patient with flat affect and states she, "is not feeling well." Patient agreed to [place] Management services and she states she does not have a scale to weigh with.  Patient request that "my husband can sign it for me." Consent form signed and copies given with [facility] Care Management folder with information and contacts. Explained 24 hour nurse line and information given. Updated inpatient RNCM, Zara Council. about patient consent to post hospital follow up.  For questions, please contact:  Natividad Brood, RN BSN [facility] Liaison  [phone] business mobile phone Toll free office [phone]

## 2017-06-18 NOTE — Plan of Care (Signed)
Problem: Activity: Goal: Capacity to carry out activities will improve Outcome: Progressing Relates shortness of breath is improving   Problem: Cardiac: Goal: Ability to achieve and maintain adequate cardiopulmonary perfusion will improve Outcome: Progressing Maintains oxygen saturation in the 90's on room air   Problem: Education: Goal: Ability to demonstrate managment of disease process will improve Outcome: Progressing RN reviewed heart failure booklet with patient, [facility] to follow patient at home

## 2017-06-19 ENCOUNTER — Other Ambulatory Visit: Payer: Self-pay | Admitting: Physician Assistant

## 2017-06-19 ENCOUNTER — Telehealth: Payer: Self-pay | Admitting: Cardiology

## 2017-06-19 ENCOUNTER — Encounter (HOSPITAL_COMMUNITY): Payer: Self-pay | Admitting: Physician Assistant

## 2017-06-19 DIAGNOSIS — O163 Unspecified maternal hypertension, third trimester: Secondary | ICD-10-CM

## 2017-06-19 DIAGNOSIS — E8770 Fluid overload, unspecified: Secondary | ICD-10-CM

## 2017-06-19 MED ORDER — PROPRANOLOL HCL 20 MG PO TABS
20.0000 mg | ORAL_TABLET | Freq: Two times a day (BID) | ORAL | Status: DC
  Administered 2017-06-19: 20 mg via ORAL
  Filled 2017-06-19 (×2): qty 1

## 2017-06-19 MED ORDER — NIFEDIPINE ER OSMOTIC RELEASE 30 MG PO TB24
30.0000 mg | ORAL_TABLET | Freq: Every day | ORAL | Status: DC
  Administered 2017-06-19: 30 mg via ORAL
  Filled 2017-06-19: qty 1

## 2017-06-19 MED ORDER — NIFEDIPINE ER 30 MG PO TB24: 30.0000 mg | ORAL_TABLET | Freq: Every day | ORAL | 1 refills | Status: DC

## 2017-06-19 MED ORDER — ALPRAZOLAM 0.5 MG PO TABS
0.5000 mg | ORAL_TABLET | Freq: Once | ORAL | Status: AC
  Administered 2017-06-19: 0.5 mg via ORAL
  Filled 2017-06-19: qty 1

## 2017-06-19 MED ORDER — FUROSEMIDE 10 MG/ML IJ SOLN: 40.0000 mg | Freq: Once | INTRAMUSCULAR | Status: DC

## 2017-06-19 MED ORDER — PROPRANOLOL HCL 20 MG PO TABS: 20.0000 mg | ORAL_TABLET | Freq: Two times a day (BID) | ORAL | 1 refills | Status: DC

## 2017-06-19 MED ORDER — AMLODIPINE BESYLATE 2.5 MG PO TABS: 2.5000 mg | ORAL_TABLET | Freq: Every day | ORAL | Status: DC

## 2017-06-19 MED ORDER — AMLODIPINE BESYLATE 2.5 MG PO TABS: 2.5000 mg | ORAL_TABLET | Freq: Every day | ORAL | 1 refills | Status: DC

## 2017-06-19 NOTE — Progress Notes (Addendum)
Pt feeling better after Xanax and propranolol. VSS, BP stable, not tachycardic, hypoxic or tachypneic, telemetry stable, sypmtoms improved. Will plan to resume propranolol at dc without new med. Refill sent in per request. Would keep in mind in future patient is emotionally sensitive to starting any new meds. Plan f/u as planned.  Discharge med list: Allergies as of 06/19/2017      Reactions   Pineapple Swelling   Strawberry Extract Swelling   Divalproex Sodium Hives, Rash   Haldol [haloperidol] Palpitations, Rash   "difficulty breathing"      Medication List    TAKE these medications   acetaminophen 325 MG tablet Commonly known as:  TYLENOL Take 650 mg by mouth every 6 (six) hours as needed for mild pain.   albuterol 108 (90 Base) MCG/ACT inhaler Commonly known as:  PROVENTIL HFA;VENTOLIN HFA Inhale 2 puffs into the lungs every 6 (six) hours as needed for wheezing or shortness of breath.   cetirizine 10 MG tablet Commonly known as:  ZYRTEC Take 10 mg by mouth daily as needed. For allergies   COMFORT FIT MATERNITY SUPP LG Misc 1 Units by Does not apply route daily.   diphenhydrAMINE 25 mg capsule Commonly known as:  BENADRYL Take 1 capsule (25 mg total) by mouth every 6 (six) hours as needed.   fluticasone-salmeterol 115-21 MCG/ACT inhaler Commonly known as:  ADVAIR HFA Inhale 2 puffs into the lungs 2 (two) times daily.   ibuprofen 200 MG tablet Commonly known as:  ADVIL,MOTRIN Take 400-600 mg by mouth every 6 (six) hours as needed for mild pain. What changed:  Another medication with the same name was removed. Continue taking this medication, and follow the directions you see here.   prenatal multivitamin Tabs tablet Take 1 tablet by mouth daily at 12 noon.   propranolol 20 MG tablet Commonly known as:  INDERAL Take 1 tablet (20 mg total) by mouth 2 (two) times daily.            Discharge Care Instructions        Start     Ordered   06/19/17 0000  Diet -  low sodium heart healthy     06/19/17 0852   06/19/17 0000  Increase activity slowly     06/19/17 0855   06/19/17 0000  propranolol (INDERAL) 20 MG tablet  2 times daily    Question:  Supervising Provider  Answer:  Jolaine Artist   06/19/17 1607       Shakeda Pearse PA-C

## 2017-06-19 NOTE — Progress Notes (Signed)
Progress Note  Patient Name: [name] Date of Encounter: 06/19/2017  Primary Cardiologist: new/ Meda Coffee  Subjective   The patient feels better today.   Inpatient Medications    Scheduled Meds: . amLODipine  2.5 mg Oral Daily  . furosemide  40 mg Intravenous BID  . mometasone-formoterol  2 puff Inhalation BID   Continuous Infusions:  PRN Meds: acetaminophen, albuterol   Vital Signs    Vitals:   06/18/17 1845 06/18/17 1955 06/18/17 2357 06/19/17 0511  BP: (!) 142/92 137/81 137/73 139/76  Pulse: 73 (!) 57 75 66  Resp:  18 18 18   Temp:  98.3 F (36.8 C) 98.6 F (37 C) 98.3 F (36.8 C)  TempSrc:  Oral Oral Oral  SpO2: 99% 98% 95% 96%  Weight:    287 lb 4.8 oz (130.3 kg)  Height:        Intake/Output Summary (Last 24 hours) at 06/19/17 0806 Last data filed at 06/18/17 2358  Gross per 24 hour  Intake              360 ml  Output             4500 ml  Net            -4140 ml   Filed Weights   06/17/17 2115 06/18/17 0354 06/19/17 0511  Weight: (!) 300 lb 3.2 oz (136.2 kg) 298 lb (135.2 kg) 287 lb 4.8 oz (130.3 kg)   Telemetry    SR - Personally Reviewed  ECG    SR, normal ECG - Personally Reviewed  Physical Exam  GEN: No acute distress.   Neck: No JVD Cardiac: RRR, no murmurs, rubs, or gallops.  Respiratory: Clear to auscultation bilaterally. GI: Soft, nontender, non-distended  MS: No edema; No deformity. Neuro:  Nonfocal  Psych: Normal affect   Labs    Chemistry  Recent Labs Lab 06/17/17 0928 06/18/17 0440  NA 141 141  K 4.1 3.4*  CL 109 107  CO2 28 26  GLUCOSE 96 99  BUN 8 6  CREATININE 0.79 0.78  CALCIUM 9.1 9.0  PROT 6.0*  --   ALBUMIN 2.9*  --   AST 29  --   ALT 28  --   ALKPHOS 103  --   BILITOT 0.6  --   GFRNONAA >60 >60  GFRAA >60 >60  ANIONGAP 4* 8    Hematology  Recent Labs Lab 06/12/17 0845 06/17/17 0950 06/18/17 0440  WBC 17.8* 6.0 7.6  RBC 4.02 3.93 4.02  HGB 11.8* 11.0* 11.3*  HCT 34.7* 34.4* 35.1*    MCV 86.3 87.5 87.3  MCH 29.4 28.0 28.1  MCHC 34.0 32.0 32.2  RDW 15.3 14.9 14.9  PLT 192 241 269   Cardiac EnzymesNo results for input(s): TROPONINI in the last 168 hours.   Recent Labs Lab 06/17/17 1000  TROPIPOC 0.00    BNP  Recent Labs Lab 06/17/17 0950  BNP 186.6*    DDimer   Recent Labs Lab 06/17/17 1126  DDIMER 2.71*    Radiology    Dg Chest 2 View  Result Date: 06/17/2017 CLINICAL DATA:  Shortness of breath and lightheadedness this morning. Patient is 4 days postpartum. History of cardiomegaly, palpitations, hypertension, former smoker. EXAM: CHEST  2 VIEW COMPARISON:  Chest x-ray of July 25, 2016 FINDINGS: The lungs are adequately inflated. There is no focal infiltrate. The cardiac silhouette is mildly enlarged. The pulmonary vascularity is engorged. There is no pleural effusion or pneumothorax.  The bony thorax exhibits no acute abnormality. IMPRESSION: Mild cardiomegaly with pulmonary vascular congestion may reflect low-grade CHF. There is no alveolar pneumonia nor pleural effusion. Electronically Signed   By: David  Martinique M.D.   On: 06/17/2017 10:50   Ct Angio Chest Pe W And/or Wo Contrast  Result Date: 06/17/2017 CLINICAL DATA:  Chest pain and shortness of breath beginning a few days ago. 4 days postpartum. EXAM: CT ANGIOGRAPHY CHEST WITH CONTRAST TECHNIQUE: Multidetector CT imaging of the chest was performed using the standard protocol during bolus administration of intravenous contrast. Multiplanar CT image reconstructions and MIPs were obtained to evaluate the vascular anatomy. CONTRAST:  80 cc Isovue 370 intravenous COMPARISON:  Chest x-ray from earlier today.  Chest CT 08/17/2007 FINDINGS: Cardiovascular: Satisfactory opacification of the pulmonary arteries to the segmental level. No evidence of pulmonary embolism. Borderline cardiomegaly. No pericardial effusion. Mediastinum/Nodes: Negative for adenopathy . Lungs/Pleura: Generalized septal thickening at the  bases with trace pleural effusions. Generalized airway cuffing. No consolidation. No pneumothorax. Upper Abdomen: No acute finding Musculoskeletal: No acute or aggressive finding Review of the MIP images confirms the above findings. IMPRESSION: 1. Mild CHF. 2. Negative for pulmonary embolism. Electronically Signed   By: Monte Fantasia M.D.   On: 06/17/2017 13:42   Cardiac Studies   TTE: 06/17/17 - Left ventricle: The cavity size was normal. Wall thickness was   normal. Systolic function was normal. The estimated ejection   fraction was in the range of 55% to 60%.    Patient Profile     31 y.o. female 6 days postpartum  Assessment & Plan    1. CHF  A very pleasant 30 year old female who is 5 days postpartum with her second child. The patient has no prior medical history of heart disease, she delivered a healthy baby girl 8 years ago with no complications. These second baby was overdue and she was admitted for induction on September 4 at 7 AM and didn't deliver until 24 hour later. She has received IV fluids during the entire time. After she went home she felt on and off shortness of breath, no chest pain and occasional palpitations. She denies any paroxysmal nocturnal dyspnea and sleeps on 2 pillows but that's chronic. She has minimal lower extremity edema that hasn't changed. On physical exam blood pressure is 017 systolic, she has no S3 or S4, she has crackles in her lung bases and mild lower extremity edema. Her labs show normal creatinine BNP of 186, elevated d-dimer, her EKG is normal, chest x-rays consistent with pulmonary edema, CT chest shows no pulmonary embolism and I have personally reviewed her CT her coronaries have normal origin.  Echocardiogram performed bedside showed normal LVEF 60-65%, with global longitudinal strain of -20% that represents a normal value.  This most probably represents acute heart failure secondary to fluid overload during prolonged induced labor as well  as fluid shifts in the postpartum period.   The patient diuresed 13 lbs in 2 days, she will be discharged today with no lasix, she will restart breastfeeding today.  We will switch amlodipine to nifedipine 30 mg po daily.  We will arrange for an early follow up in the clinic to recheck CHF and BP. Crea is stable.  For questions or updates, please contact [place] Please consult [email or web address] for contact info under Cardiology/STEMI.     Signed, Ena Dawley, MD  06/19/2017, 8:06 AM

## 2017-06-19 NOTE — Discharge Summary (Signed)
Patient reported sense of nervousness and chest pressure while preparing for discharge, shortly after administration of nifedipine (chosen due to its safety profile with breastfeeding). She reports tingling in her hands and feet and sense of pressure in her head when crying. She states these symptoms are very similar to prior anxiety attacks - in the past improved with propranolol. Nervousness is reported as potential side effect to nifedipine, possibly compounded by underlying h/o anxiety. EKG is normal. BP 139/68 at onset of event, now 161/92 after increase in anxiety. [name] assessed patient with me - will try a dose of low dose Xanax x1 (safe in BF in low doses), give propranolol 20mg  now, observe over the next few hours. She reassured pt that exam was benign. Pt strongly prefers to remain on propranolol instead of starting new med for BP. I called pt's pharmacy to update them to cancel nifedipine rx and amlodipine rx - was unable to reach an actual pharmacy employee after being on hold for over 7 minutes so I left messages on the pharmacy line. Pt also made aware to continue home propranolol.  DC summary has already been attested by [name], so I cannot edit it - discharge med list will be populated in separate note when DC is finalized.  Keyleigh Manninen PA-C

## 2017-06-19 NOTE — Telephone Encounter (Signed)
New message      TOC appt made on 06-25-17 with Mare Loan at 2pm per [facility]

## 2017-06-19 NOTE — Progress Notes (Signed)
Discharge to home, no complaints of any pain or discomfort. Husband at bedside. Discharge instructions and follow up appointments discussed with patient, verbalized understanding. PIV removed no signs of swelling noted.

## 2017-06-19 NOTE — Progress Notes (Signed)
Was called in patients room complaining of chest tightness but no chest pains.Per husband  The pt has been experiencing above symtomps   Minutes after Procardia was given,pt even  took a shower. This Rn was just called in the room at time time, pt was crying, placed on 2L/[place] for comfort. Cardiology PA paged made aware with orders.

## 2017-06-19 NOTE — Discharge Summary (Signed)
Discharge Summary    Patient ID: [name],  MRN: [id number], DOB/AGE: [birth year] 31 y.o.  Admit date: 06/17/2017 Discharge date: 06/19/2017  Primary Care Provider: [name] Primary Cardiologist: [name]  Discharge Diagnoses    Principal Problem:   Acute heart failure with normal ejection fraction [facility]) Active Problems:   Essential hypertension   OSA (obstructive sleep apnea)   Fluid overload   Morbid obesity [facility])  Diagnostic Studies/Procedures    2D Echo 06/2017 Study Conclusions  - Left ventricle: The cavity size was normal. Wall thickness was   normal. Systolic function was normal. The estimated ejection   fraction was in the range of 55% to 60%. _____________     History of Present Illness     [name] is a 31 y.o. female with history of morbid obesity, HTN, OSA, Asthma, bioplar 1 disorder, depression, former tobacco abuse, prior alcohol/cannibis use who presented to [facility] with complaints of dyspnea. She gave birth via spontaneous vaginal delivery on 06/12/17. Her dyspnea started the day she was released from the hospital after giving birth. It would come and go, worse with exertion, also with chest tightness and palpitations. D-dimer was + at 2.17, however chest CT was negative for PE. It did suggest heart failure. BNP is abnormal at 186.9. CXR shows mild cardiomegaly with pulmonary vascular congestion which may reflect low-grade CHF. Hgb 11.0. BMP unremarkable. EKG showed NSR. She was admitted for further evaluation.  Hospital Course    It was felt her symptoms were likely due to volume overload from continuous IV fluid administration during her delivery. She was treated with IV Lasix and diuresed from 300lb down to 287lb with -7L. Her propranolol was held on admisison and she was treated with amlodipine while inpatient. This was changed to nifedipine at discharge given compatability with breastfeeding. [name] recommends very close f/u in  clinic early next week. She did not feel the patient would require standing diuretic right now as she is breastfeeding and this should encourage continued fluid loss. [name] has seen and examined the patient today and feels she is stable for discharge. She was encouraged to call her OB to let her know of this recent admission and discuss plan for f/u in their office. She was also advised to call if BP running higher. AVS also contained HF discharge recs re: weight gain and sodium. _____________  Discharge Vitals Blood pressure (!) 147/83, pulse 64, temperature 98.3 F (36.8 C), temperature source Oral, resp. rate 17, height 5\' 5"  (1.651 m), weight 287 lb 4.8 oz (130.3 kg), last menstrual period 08/18/2016, SpO2 97 %, unknown if currently breastfeeding.  Filed Weights   06/17/17 2115 06/18/17 0354 06/19/17 0511  Weight: (!) 300 lb 3.2 oz (136.2 kg) 298 lb (135.2 kg) 287 lb 4.8 oz (130.3 kg)    Labs & Radiologic Studies    CBC  Recent Labs  06/17/17 0950 06/18/17 0440  WBC 6.0 7.6  HGB 11.0* 11.3*  HCT 34.4* 35.1*  MCV 87.5 87.3  PLT 241 332   Basic Metabolic Panel  Recent Labs  06/17/17 0928 06/18/17 0440  NA 141 141  K 4.1 3.4*  CL 109 107  CO2 28 26  GLUCOSE 96 99  BUN 8 6  CREATININE 0.79 0.78  CALCIUM 9.1 9.0   Liver Function Tests  Recent Labs  06/17/17 0928  AST 29  ALT 28  ALKPHOS 103  BILITOT 0.6  PROT 6.0*  ALBUMIN 2.9*   D-Dimer  Recent Labs  06/17/17 1126  DDIMER 2.71*   _____________  Dg Chest 2 View  Result Date: 06/17/2017 CLINICAL DATA:  Shortness of breath and lightheadedness this morning. Patient is 4 days postpartum. History of cardiomegaly, palpitations, hypertension, former smoker. EXAM: CHEST  2 VIEW COMPARISON:  Chest x-ray of July 25, 2016 FINDINGS: The lungs are adequately inflated. There is no focal infiltrate. The cardiac silhouette is mildly enlarged. The pulmonary vascularity is engorged. There is no pleural effusion or  pneumothorax. The bony thorax exhibits no acute abnormality. IMPRESSION: Mild cardiomegaly with pulmonary vascular congestion may reflect low-grade CHF. There is no alveolar pneumonia nor pleural effusion. Electronically Signed   By: David  Martinique M.D.   On: 06/17/2017 10:50   Ct Angio Chest Pe W And/or Wo Contrast  Result Date: 06/17/2017 CLINICAL DATA:  Chest pain and shortness of breath beginning a few days ago. 4 days postpartum. EXAM: CT ANGIOGRAPHY CHEST WITH CONTRAST TECHNIQUE: Multidetector CT imaging of the chest was performed using the standard protocol during bolus administration of intravenous contrast. Multiplanar CT image reconstructions and MIPs were obtained to evaluate the vascular anatomy. CONTRAST:  80 cc Isovue 370 intravenous COMPARISON:  Chest x-ray from earlier today.  Chest CT 08/17/2007 FINDINGS: Cardiovascular: Satisfactory opacification of the pulmonary arteries to the segmental level. No evidence of pulmonary embolism. Borderline cardiomegaly. No pericardial effusion. Mediastinum/Nodes: Negative for adenopathy . Lungs/Pleura: Generalized septal thickening at the bases with trace pleural effusions. Generalized airway cuffing. No consolidation. No pneumothorax. Upper Abdomen: No acute finding Musculoskeletal: No acute or aggressive finding Review of the MIP images confirms the above findings. IMPRESSION: 1. Mild CHF. 2. Negative for pulmonary embolism. Electronically Signed   By: Monte Fantasia M.D.   On: 06/17/2017 13:42   Disposition   Pt is being discharged home today in good condition.  Follow-up Plans & Appointments    Follow-up Information    Consuelo Pandy, PA-C Follow up.   Specialties:  Cardiology, Radiology Why:  [facility] - as below. Brittainy is one of the PAs that works closely with [name]. Please arrive 15 minutes prior to appointment time to make sure you get checked in on time. Contact information: [address] [place]  [phone]        OB/GYN Follow up.   Why:  Please call your OB to discuss follow-up plan for monitoring of your blood pressure post-partum given this admission.         Discharge Instructions    Diet - low sodium heart healthy    Complete by:  As directed    Increase activity slowly    Complete by:  As directed    Please monitor your blood pressure occasionally at home. Call your doctor if you tend to get readings of greater than 130 on the top number or 80 on the bottom number.      Discharge Medications   Allergies as of 06/19/2017      Reactions   Pineapple Swelling   Strawberry Extract Swelling   Divalproex Sodium Hives, Rash   Haldol [haloperidol] Palpitations, Rash   "difficulty breathing"      Medication List    STOP taking these medications   propranolol 20 MG tablet Commonly known as:  INDERAL     TAKE these medications   acetaminophen 325 MG tablet Commonly known as:  TYLENOL Take 650 mg by mouth every 6 (six) hours as needed for mild pain.   albuterol 108 (  90 Base) MCG/ACT inhaler Commonly known as:  PROVENTIL HFA;VENTOLIN HFA Inhale 2 puffs into the lungs every 6 (six) hours as needed for wheezing or shortness of breath.   cetirizine 10 MG tablet Commonly known as:  ZYRTEC Take 10 mg by mouth daily as needed. For allergies   COMFORT FIT MATERNITY SUPP LG Misc 1 Units by Does not apply route daily.   diphenhydrAMINE 25 mg capsule Commonly known as:  BENADRYL Take 1 capsule (25 mg total) by mouth every 6 (six) hours as needed.   fluticasone-salmeterol 115-21 MCG/ACT inhaler Commonly known as:  ADVAIR HFA Inhale 2 puffs into the lungs 2 (two) times daily.   ibuprofen 200 MG tablet Commonly known as:  ADVIL,MOTRIN Take 400-600 mg by mouth every 6 (six) hours as needed for mild pain. What changed:  Another medication with the same name was removed. Continue taking this medication, and follow the directions you see here.   NIFEdipine  30 MG 24 hr tablet Commonly known as:  PROCARDIA-XL/ADALAT CC Take 1 tablet (30 mg total) by mouth daily.   prenatal multivitamin Tabs tablet Take 1 tablet by mouth daily at 12 noon.            Discharge Care Instructions        Start     Ordered   06/19/17 0000  NIFEdipine (PROCARDIA-XL/ADALAT CC) 30 MG 24 hr tablet  Daily    Question:  Supervising Provider  Answer:  Jolaine Artist   06/19/17 0852   06/19/17 0000  Diet - low sodium heart healthy     06/19/17 0852   06/19/17 0000  Increase activity slowly     06/19/17 0855       Allergies:  Allergies  Allergen Reactions  . Pineapple Swelling  . Strawberry Extract Swelling  . Divalproex Sodium Hives and Rash  . Haldol [Haloperidol] Palpitations and Rash    "difficulty breathing"       Outstanding Labs/Studies   n/a  Duration of Discharge Encounter   Greater than 30 minutes including physician time.  Signed, Charlie Pitter PA-C 06/19/2017, 8:55 AM

## 2017-06-20 ENCOUNTER — Other Ambulatory Visit: Payer: Self-pay

## 2017-06-20 DIAGNOSIS — I509 Heart failure, unspecified: Secondary | ICD-10-CM

## 2017-06-20 NOTE — Patient Outreach (Signed)
[facility]) Care Management  06/20/2017  [name] 1986-05-02 [id number]  Subjective: client reports she is doing better  Objective: none  Assessment: 31 year old with recent delivery admitted due to heart failure. RNCM called to complete transition of care call. Client reports she is doing better.   Heart failure: RNCM discussed heart failure. Client is able to state parameters for when to call the doctor. Client reports she got scales today and is weighing self. She states she is able to lay down and breathe without difficulty. She states edema of lower extremities much better. She states she was not able to get both scales and blood pressure cuff, therefore, she got scales.  Medications reviewed: client reports she has all the medications ordered. She denies any difficulty with medication management.  Plan: follow up regarding blood pressure cuff; home visit next week.  Thea Silversmith, RN, MSN, [place] Coordinator Cell: [phone]

## 2017-06-20 NOTE — Telephone Encounter (Signed)
TCM call:  Patient contacted on 06/20/2017 at 10:19am Patient understands to follow up with  Provider on 06/25/2017 at [place] Patient understands discharge instructions? yes Patient understands medications and regimen? yes Patient understands to bring all medications to this visit? yes

## 2017-06-20 NOTE — Telephone Encounter (Signed)
Medication Detail    Disp Refills Start End   propranolol (INDERAL) 20 MG tablet 60 tablet 1 06/19/2017    Sig - Route: Take 1 tablet (20 mg total) by mouth 2 (two) times daily. - Oral   Sent to pharmacy as: propranolol (INDERAL) 20 MG tablet   E-Prescribing Status: Receipt confirmed by pharmacy (06/19/2017 4:08 PM EDT)   Pharmacy   [id number] - [place], [address] AT [address]

## 2017-06-21 ENCOUNTER — Emergency Department (HOSPITAL_COMMUNITY)
Admission: EM | Admit: 2017-06-21 | Discharge: 2017-06-21 | Disposition: A | Discharge status: Home / Self Care | Payer: Medicare HMO | Attending: Emergency Medicine | Admitting: Emergency Medicine

## 2017-06-21 ENCOUNTER — Encounter (HOSPITAL_COMMUNITY): Payer: Self-pay | Admitting: *Deleted

## 2017-06-21 ENCOUNTER — Emergency Department (HOSPITAL_COMMUNITY): Payer: Medicare HMO

## 2017-06-21 DIAGNOSIS — I509 Heart failure, unspecified: Secondary | ICD-10-CM | POA: Insufficient documentation

## 2017-06-21 DIAGNOSIS — I10 Essential (primary) hypertension: Secondary | ICD-10-CM

## 2017-06-21 DIAGNOSIS — I110 Hypertensive heart disease with heart failure: Secondary | ICD-10-CM | POA: Diagnosis not present

## 2017-06-21 DIAGNOSIS — F17210 Nicotine dependence, cigarettes, uncomplicated: Secondary | ICD-10-CM | POA: Diagnosis not present

## 2017-06-21 DIAGNOSIS — R079 Chest pain, unspecified: Secondary | ICD-10-CM | POA: Diagnosis not present

## 2017-06-21 DIAGNOSIS — J45909 Unspecified asthma, uncomplicated: Secondary | ICD-10-CM | POA: Diagnosis not present

## 2017-06-21 DIAGNOSIS — Z79899 Other long term (current) drug therapy: Secondary | ICD-10-CM | POA: Diagnosis not present

## 2017-06-21 LAB — BASIC METABOLIC PANEL
Anion gap: 8 (ref 5–15)
BUN: 10 mg/dL (ref 6–20)
CO2: 25 mmol/L (ref 22–32)
Calcium: 9.3 mg/dL (ref 8.9–10.3)
Chloride: 106 mmol/L (ref 101–111)
Creatinine, Ser: 0.93 mg/dL (ref 0.44–1.00)
GFR calc Af Amer: >60 mL/min (ref >60)
GFR calc non Af Amer: >60 mL/min (ref >60)
Glucose, Bld: 87 mg/dL (ref 65–99)
Potassium: 3.9 mmol/L (ref 3.5–5.1)
Sodium: 139 mmol/L (ref 135–145)

## 2017-06-21 LAB — CBC
HCT: 37.9 % (ref 36.0–46.0)
Hemoglobin: 12.4 g/dL (ref 12.0–15.0)
MCH: 28.3 pg (ref 26.0–34.0)
MCHC: 32.7 g/dL (ref 30.0–36.0)
MCV: 86.5 fL (ref 78.0–100.0)
Platelets: 316 10*3/uL (ref 150–400)
RBC: 4.38 MIL/uL (ref 3.87–5.11)
RDW: 14.4 % (ref 11.5–15.5)
WBC: 5.8 10*3/uL (ref 4.0–10.5)

## 2017-06-21 LAB — I-STAT TROPONIN, ED: Troponin i, poc: 0 ng/mL (ref 0.00–0.08)

## 2017-06-21 MED ORDER — AMLODIPINE BESYLATE 5 MG PO TABS: 5.0000 mg | ORAL_TABLET | Freq: Every day | ORAL | 0 refills | Status: DC

## 2017-06-21 MED ORDER — ALPRAZOLAM 0.25 MG PO TABS
0.2500 mg | ORAL_TABLET | Freq: Once | ORAL | Status: AC
  Administered 2017-06-21: 0.25 mg via ORAL
  Filled 2017-06-21: qty 1

## 2017-06-21 MED ORDER — ALPRAZOLAM 0.25 MG PO TABS: 0.2500 mg | ORAL_TABLET | Freq: Every evening | ORAL | 0 refills | Status: DC | PRN

## 2017-06-21 MED ORDER — AMLODIPINE BESYLATE 5 MG PO TABS
5.0000 mg | ORAL_TABLET | Freq: Once | ORAL | Status: AC
  Administered 2017-06-21: 5 mg via ORAL
  Filled 2017-06-21: qty 1

## 2017-06-21 NOTE — ED Triage Notes (Signed)
Pt recently had baby. Was admitted here due to chf and htn. Pt reports feeling lightheaded and chest discomfort this am and checked bp and it was elevated. Took propanolol pta and bp is now 148/76. No acute distress is noted at triage.
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
## 2017-06-25 ENCOUNTER — Ambulatory Visit: Payer: Medicare HMO | Admitting: Cardiology

## 2017-06-27 ENCOUNTER — Other Ambulatory Visit: Payer: Self-pay

## 2017-06-27 NOTE — Patient Outreach (Addendum)
[facility]) Care Management   06/27/2017  [name] 09-06-1986 [id number]  [name] is an 31 y.o. female  Subjective: client expressed concerns regarding diagnosis of heart failure and wanting to be around for her children.  Objective:  BP (!) 130/102   Pulse 76   Ht 1.651 m (5\' 5" )   Wt 274 lb (124.3 kg)   LMP 08/18/2016   SpO2 98%   BMI 45.60 kg/m   Review of Systems  Respiratory:       Lung sounds clear, no wheezing, no rales.  Cardiovascular:       Heart rate regular    Physical Exam  Encounter Medications:   Outpatient Encounter Prescriptions as of 06/27/2017  Medication Sig  . acetaminophen (TYLENOL) 325 MG tablet Take 650 mg by mouth every 6 (six) hours as needed for mild pain.  Marland Kitchen albuterol (PROVENTIL HFA;VENTOLIN HFA) 108 (90 BASE) MCG/ACT inhaler Inhale 2 puffs into the lungs every 6 (six) hours as needed for wheezing or shortness of breath.  . ALPRAZolam (XANAX) 0.25 MG tablet Take 1 tablet (0.25 mg total) by mouth at bedtime as needed for anxiety.  Marland Kitchen amLODipine (NORVASC) 5 MG tablet Take 1 tablet (5 mg total) by mouth daily.  . cetirizine (ZYRTEC) 10 MG tablet Take 10 mg by mouth daily as needed. For allergies  . diphenhydrAMINE (BENADRYL) 25 mg capsule Take 1 capsule (25 mg total) by mouth every 6 (six) hours as needed.  . Elastic Bandages & Supports (COMFORT FIT MATERNITY SUPP LG) MISC 1 Units by Does not apply route daily.  . fluticasone-salmeterol (ADVAIR HFA) 115-21 MCG/ACT inhaler Inhale 2 puffs into the lungs 2 (two) times daily.  Marland Kitchen ibuprofen (ADVIL,MOTRIN) 200 MG tablet Take 400-600 mg by mouth every 6 (six) hours as needed for mild pain.  . Prenatal Vit-Fe Fumarate-FA (PRENATAL MULTIVITAMIN) TABS tablet Take 1 tablet by mouth daily at 12 noon.  . propranolol (INDERAL) 20 MG tablet Take 1 tablet (20 mg total) by mouth 2 (two) times daily.   No facility-administered encounter medications on file as of 06/27/2017.      Functional Status:   In your present state of health, do you have any difficulty performing the following activities: 06/27/2017 06/17/2017  Hearing? N N  Vision? N N  Difficulty concentrating or making decisions? N N  Walking or climbing stairs? N Y  Dressing or bathing? N N  Doing errands, shopping? N Y  Conservation officer, nature and eating ? N -  Using the Toilet? N -  In the past six months, have you accidently leaked urine? N -  Do you have problems with loss of bowel control? N -  Managing your Medications? N -  Managing your Finances? N -  Housekeeping or managing your Housekeeping? N -  Some recent data might be hidden    Fall/Depression Screening:    Fall Risk  06/17/2017  Falls in the past year? No   PHQ 2/9 Scores 06/27/2017 06/17/2017  PHQ - 2 Score 1 -  Exception Documentation - Other- indicate reason in comment box  Not completed - Patient was in [facility] ED during our phone call. She allowed her husband to finish completing the screening questions.     Assessment:  31 year old with recent delivery admitted due to heart failure. History of asthma. Present during visit was client's husband. He has been staying home with client to help her take care of her children. She as a daughter in  the 3rd grade and the new born.   She reports she is weighing self daily. She also has obtained a blood pressure cuff and is monitoring her blood pressure.   Diastolic blood pressure elevated, however client states she has not taken her medication today. She reports she usually takes it at 11am. Client took her medication during home visit.   RNCM discussed heart failure and when to call for assistance, RNCM also discussed action plan regarding asthma.  Support provided regarding new diagnosis of heart failure. RNCM encouraged client to discuss her feeling regarding the diagnosis to her cardiologist.  [facility] encouraged client to call her obstetrician to inform that she has been in the hospital post  delivery for heart failure.  RN provided positive feedback to both her and her husband regarding self care management.  Plan: telephonic follow up next week. [facility] CM Care Plan Problem One     Most Recent Value  Care Plan Problem One  at risk for readmission  Role Documenting the Problem One  Care Management [place] for Problem One  Active  [facility] Long Term Goal   client will not be readmitted within the next 31 days.  [facility] Long Term Goal Start Date  06/20/17  Interventions for Problem One Long Term Goal  RNCM discussed heart failure and when to call for assistance, RNCM also discussed action plan regarding asthma.  [facility] CM Short Term Goal #1   client will weigh and record weights daily  [facility] CM Short Term Goal #1 Start Date  06/20/17  Interventions for Short Term Goal #1  encouraged to continue to take medications as prescribed. client encouraged to follow up with providers as scheduled. reviewed heart failure zone tool and explained it purpose. also discussed action plan for asthma.   [facility] CM Short Term Goal #2   client will attend follow up appointments as scheduled within the next 30 days.  [facility] CM Short Term Goal #2 Start Date  06/20/17  Interventions for Short Term Goal #2  discussed questions to ask providers, encouraged client to call obstetrician and notify regarding heart failure.      Thea Silversmith, RN, MSN, [place] Coordinator Cell: [phone]

## 2017-06-28 ENCOUNTER — Inpatient Hospital Stay (HOSPITAL_COMMUNITY)
Admission: AD | Admit: 2017-06-28 | Discharge: 2017-06-28 | Disposition: A | Discharge status: Home / Self Care | Payer: Medicare HMO | Source: Ambulatory Visit | Attending: Obstetrics and Gynecology | Admitting: Obstetrics and Gynecology

## 2017-06-28 ENCOUNTER — Encounter (HOSPITAL_COMMUNITY): Payer: Self-pay | Admitting: *Deleted

## 2017-06-28 ENCOUNTER — Telehealth: Payer: Self-pay | Admitting: Cardiology

## 2017-06-28 DIAGNOSIS — F418 Other specified anxiety disorders: Secondary | ICD-10-CM

## 2017-06-28 DIAGNOSIS — Z87891 Personal history of nicotine dependence: Secondary | ICD-10-CM | POA: Insufficient documentation

## 2017-06-28 DIAGNOSIS — R030 Elevated blood-pressure reading, without diagnosis of hypertension: Secondary | ICD-10-CM | POA: Diagnosis present

## 2017-06-28 DIAGNOSIS — O99345 Other mental disorders complicating the puerperium: Secondary | ICD-10-CM | POA: Insufficient documentation

## 2017-06-28 DIAGNOSIS — O903 Peripartum cardiomyopathy: Secondary | ICD-10-CM | POA: Diagnosis not present

## 2017-06-28 DIAGNOSIS — F419 Anxiety disorder, unspecified: Secondary | ICD-10-CM | POA: Diagnosis not present

## 2017-06-28 LAB — COMPREHENSIVE METABOLIC PANEL
ALT: 55 U/L — ABNORMAL HIGH (ref 14–54)
AST: 42 U/L — ABNORMAL HIGH (ref 15–41)
Albumin: 3.9 g/dL (ref 3.5–5.0)
Alkaline Phosphatase: 127 U/L — ABNORMAL HIGH (ref 38–126)
Anion gap: 8 (ref 5–15)
BUN: 12 mg/dL (ref 6–20)
CO2: 25 mmol/L (ref 22–32)
Calcium: 9.6 mg/dL (ref 8.9–10.3)
Chloride: 105 mmol/L (ref 101–111)
Creatinine, Ser: 0.93 mg/dL (ref 0.44–1.00)
GFR calc Af Amer: >60 mL/min (ref >60)
GFR calc non Af Amer: >60 mL/min (ref >60)
Glucose, Bld: 97 mg/dL (ref 65–99)
Potassium: 4 mmol/L (ref 3.5–5.1)
Sodium: 138 mmol/L (ref 135–145)
Total Bilirubin: 1.3 mg/dL — ABNORMAL HIGH (ref 0.3–1.2)
Total Protein: 7.6 g/dL (ref 6.5–8.1)

## 2017-06-28 LAB — URINALYSIS, ROUTINE W REFLEX MICROSCOPIC
Bilirubin Urine: NEGATIVE
Glucose, UA: NEGATIVE mg/dL
Hgb urine dipstick: NEGATIVE
Ketones, ur: NEGATIVE mg/dL
Leukocytes, UA: NEGATIVE
Nitrite: NEGATIVE
Protein, ur: NEGATIVE mg/dL
Specific Gravity, Urine: 1.024 (ref 1.005–1.030)
pH: 5 (ref 5.0–8.0)

## 2017-06-28 LAB — CBC
HCT: 42.3 % (ref 36.0–46.0)
Hemoglobin: 14 g/dL (ref 12.0–15.0)
MCH: 28.7 pg (ref 26.0–34.0)
MCHC: 33.1 g/dL (ref 30.0–36.0)
MCV: 86.9 fL (ref 78.0–100.0)
Platelets: 294 10*3/uL (ref 150–400)
RBC: 4.87 MIL/uL (ref 3.87–5.11)
RDW: 14.6 % (ref 11.5–15.5)
WBC: 3.8 10*3/uL — ABNORMAL LOW (ref 4.0–10.5)

## 2017-06-28 LAB — PROTEIN / CREATININE RATIO, URINE
Creatinine, Urine: 313 mg/dL
Protein Creatinine Ratio: 0.04 mg/mg{Cre} (ref 0.00–0.15)
Total Protein, Urine: 11 mg/dL

## 2017-06-28 MED ORDER — METOCLOPRAMIDE HCL 10 MG PO TABS: 10.0000 mg | ORAL_TABLET | Freq: Three times a day (TID) | ORAL | 0 refills | Status: DC | PRN

## 2017-06-28 MED ORDER — SERTRALINE HCL 50 MG PO TABS: 50.0000 mg | ORAL_TABLET | Freq: Every day | ORAL | 5 refills | Status: DC

## 2017-06-28 NOTE — Telephone Encounter (Signed)
NEw Message  Pt c/o BP issue: STAT if pt c/o blurred vision, one-sided weakness or slurred speech  1. What are your last 5 BP readings? 9/14 185/111 yesterday 132/110 today   2. Are you having any other symptoms (ex. Dizziness, headache, blurred vision, passed out)? Tired and dizziness   3. What is your BP issue? Per pt would like to speak with RN about her medication possibly not helping her. Please call back to discuss

## 2017-06-28 NOTE — Telephone Encounter (Signed)
Pt calling to report to Dr Meda Coffee about her last 2 BP readings, as mentioned in this message.  Pt states she has been compliant with taking all her meds. Pt states that she would like for her meds to be readjusted. Pt states she has had a headache for a couple of days.  Pt saw [facility] yesterday and they encouraged her to continue taking all her meds, follow-up as planned with all her Providers, and call her OBGYN for post-partum follow-up.  Pt states she has not called her OBGYN.  Spoke with Dr Meda Coffee about the pt and she advised that before we adjust any further cardiac meds, the pt should come in sometime next week to be sees in our HTN clinic.  Endorsed this recommendation to the pt.  Informed the pt that someone from our Scheduling dept will be calling her shortly to arrange her BP clinic appt. Advised the pt to continue current regimen, maintain a low sodium diet, and make sure she follows-up with her OBGYN.  Pt verbalized understanding and agrees with this plan.

## 2017-06-28 NOTE — Telephone Encounter (Signed)
Pt currently admitted to [facility] by [facility].
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
## 2017-06-28 NOTE — Telephone Encounter (Signed)
Per [name] - she will call pt to discuss Dr [name] orders/recomendations.

## 2017-06-28 NOTE — MAU Note (Signed)
Patient was sent by [facility] office for evaluation of elevated blood pressure. Delivered--9/5--vaginal  Was seen a [facility] and told had postpartum cardiomyopathy BP at home was 156/114 Denies headache. Reports light headed, dizziness, and seeing spots at times. Denies epigastric pain or increase in swelling.   On Norvasc 5mg 

## 2017-07-02 ENCOUNTER — Other Ambulatory Visit: Payer: Self-pay
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
## 2017-07-03 ENCOUNTER — Ambulatory Visit: Payer: Self-pay

## 2017-07-03 ENCOUNTER — Other Ambulatory Visit: Payer: Self-pay | Admitting: Licensed Clinical Social Worker
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
## 2017-07-05 ENCOUNTER — Encounter: Payer: Self-pay | Admitting: Cardiology

## 2017-07-05 ENCOUNTER — Ambulatory Visit (INDEPENDENT_AMBULATORY_CARE_PROVIDER_SITE_OTHER): Payer: Medicare HMO | Admitting: Cardiology

## 2017-07-05 VITALS — BP 132/86 | HR 97 | Ht 65.0 in | Wt 272.0 lb

## 2017-07-05 DIAGNOSIS — I10 Essential (primary) hypertension: Secondary | ICD-10-CM | POA: Diagnosis not present

## 2017-07-05 NOTE — Patient Instructions (Signed)
Medication Instructions:   Your physician recommends that you continue on your current medications as directed. Please refer to the Current Medication list given to you today.   If you need a refill on your cardiac medications before your next appointment, please call your pharmacy.  Labwork: NONE ORDERED  TODAY    Testing/Procedures: NONE ORDERED  TODAY    Follow-Up:  AS SCHEDULED WITH DR Meda Coffee    Any Other Special Instructions Will Be Listed Below (If Applicable).
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
## 2017-07-09 ENCOUNTER — Other Ambulatory Visit: Payer: Self-pay | Admitting: Licensed Clinical Social Worker
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
## 2017-07-10 ENCOUNTER — Other Ambulatory Visit: Payer: Self-pay | Admitting: Licensed Clinical Social Worker

## 2017-07-10 NOTE — Patient Outreach (Signed)
[facility]) Care Management  07/10/2017  Kashayla BAYA LENTZ 01/03/1986 [id number]  Assessment- CSW successfully faxed SCAT application to SCAT office today on 07/10/17.  Plan-CSW will follow up within two weeks to follow up on if an assessment appointment has been scheduled.  Eula Fried, BSW, MSW, [email or web address] Phone: [phone] Fax: [phone]

## 2017-07-11 ENCOUNTER — Other Ambulatory Visit: Payer: Self-pay
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
## 2017-07-17 ENCOUNTER — Telehealth: Payer: Self-pay | Admitting: Cardiology

## 2017-07-17 ENCOUNTER — Other Ambulatory Visit: Payer: Self-pay

## 2017-07-17 DIAGNOSIS — R7303 Prediabetes: Secondary | ICD-10-CM | POA: Diagnosis not present

## 2017-07-17 DIAGNOSIS — R0602 Shortness of breath: Secondary | ICD-10-CM

## 2017-07-17 DIAGNOSIS — E559 Vitamin D deficiency, unspecified: Secondary | ICD-10-CM | POA: Diagnosis not present

## 2017-07-17 DIAGNOSIS — Z720 Tobacco use: Secondary | ICD-10-CM | POA: Diagnosis not present

## 2017-07-17 DIAGNOSIS — R1013 Epigastric pain: Secondary | ICD-10-CM | POA: Diagnosis not present

## 2017-07-17 DIAGNOSIS — E785 Hyperlipidemia, unspecified: Secondary | ICD-10-CM | POA: Diagnosis not present

## 2017-07-17 DIAGNOSIS — E8779 Other fluid overload: Secondary | ICD-10-CM

## 2017-07-17 DIAGNOSIS — I5031 Acute diastolic (congestive) heart failure: Secondary | ICD-10-CM

## 2017-07-17 DIAGNOSIS — I10 Essential (primary) hypertension: Secondary | ICD-10-CM

## 2017-07-17 DIAGNOSIS — J45909 Unspecified asthma, uncomplicated: Secondary | ICD-10-CM | POA: Diagnosis not present

## 2017-07-17 NOTE — Telephone Encounter (Signed)
Spoke with [name] and [name]  has noted several episodes of chest pain that radiates to back and has vomited  X 2 notes pain is worse if lays on side but if lays on back has some relief and has been burping a lot as well  .Encouraged [name] to call PMD for eval and tx and may also try and take tylenol as directed for pain .Will forward to DR Meda Coffee for review and recommendations ./cy

## 2017-07-17 NOTE — Telephone Encounter (Signed)
Please advise her to check her weight every day and go to the ER if it increases by 5 lbs.  She is supposed to take lasix 40 mg po daily, if she feels SOB she should take Lasix 40 mg po BID for the next two days.  If her SOB continues to worsen she should go to the ER
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
## 2017-07-17 NOTE — Telephone Encounter (Signed)
Attempted to call the pt back to endorse recommendations per Dr Meda Coffee, and pt picked up phone, then hung this up.

## 2017-07-17 NOTE — Telephone Encounter (Signed)
Also, please have her come to the office in the next 1-2 days and she will need a repeat echocardiogram, please arrange for an echo even if there is noone that can see her. Thank you, KN

## 2017-07-17 NOTE — Telephone Encounter (Signed)
New message   Patient states she has pain in her back and lower chest, diaphragm area for 2 weeks. Sharp Pains. Also has vomiting. Patient not sure if she should be seen here. Please call at [phone]   Pt c/o swelling: STAT is pt has developed SOB within 24 hours  1) How much weight have you gained and in what time span? n/a  2) If swelling, where is the swelling located? Both ankles  3) Are you currently taking a fluid pill? no  4) Are you currently SOB? no  5) Do you have a log of your daily weights (if so, list)? States  scale is not accurate  6) Have you gained 3 pounds in a day or 5 pounds in a week? no  7) Have you traveled recently? no

## 2017-07-18 ENCOUNTER — Other Ambulatory Visit: Payer: Self-pay | Admitting: Licensed Clinical Social Worker
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
## 2017-07-19 MED ORDER — FUROSEMIDE 40 MG PO TABS: 40.0000 mg | ORAL_TABLET | Freq: Every day | ORAL | 1 refills | Status: DC

## 2017-07-19 NOTE — Telephone Encounter (Signed)
Notified the pt that per Dr Meda Coffee, she 1st advised that she start weighing herself daily and refer to the ER if it increases by 5 lbs.  Also informed the pt that per Dr Meda Coffee she should be taking lasix 40 mg po daily.  Per the pt, she has no lasix 40 mg po daily, for the hospital gave her none on discharge.  Informed the pt that I will send in for her to have lasix 40 mg po daily, and with that, if she still feels sob, she should take lasix 40 mg po bid x 2 days only.  Informed the pt that if that doesn't help, and her SOB continues to worsen, then she should go to the ER.  Also informed the pt that Dr Meda Coffee would like for her to be seen early next week and also have an echo done.  Only availability we had to get the pt in to be seen, is at our NL office with [place] PA-C. Informed the pt that I will schedule her to see Bernerd Pho PA-C on next Monday 10/15 at 0930.  Advised the pt to arrive 20 mins prior to that appt.  Advised the pt of directions to our NL office.  Also informed the pt that I will go ahead and place the order for her to have an echo done, and have a [facility] scheduler call to arrange.  I will also send this message to [place] PA-C as an [place], and see if she could possibly help in assisting with getting her echo scheduled, after seeing the pt in the clinic.  Pt verbalized understanding and agrees with this plan.
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
## 2017-07-22 ENCOUNTER — Other Ambulatory Visit: Payer: Self-pay | Admitting: Licensed Clinical Social Worker

## 2017-07-22 ENCOUNTER — Ambulatory Visit: Payer: Medicare HMO | Admitting: Student
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
## 2017-07-23 ENCOUNTER — Other Ambulatory Visit: Payer: Self-pay | Admitting: Licensed Clinical Social Worker

## 2017-07-23 ENCOUNTER — Ambulatory Visit (INDEPENDENT_AMBULATORY_CARE_PROVIDER_SITE_OTHER): Payer: Medicare HMO | Admitting: Student

## 2017-07-23 ENCOUNTER — Encounter: Payer: Self-pay | Admitting: Student

## 2017-07-23 VITALS — BP 130/88 | HR 53 | Ht 65.0 in | Wt 269.9 lb

## 2017-07-23 DIAGNOSIS — R1013 Epigastric pain: Secondary | ICD-10-CM | POA: Diagnosis not present

## 2017-07-23 DIAGNOSIS — I10 Essential (primary) hypertension: Secondary | ICD-10-CM | POA: Diagnosis not present

## 2017-07-23 DIAGNOSIS — I5031 Acute diastolic (congestive) heart failure: Secondary | ICD-10-CM

## 2017-07-23 MED ORDER — AMLODIPINE BESYLATE 5 MG PO TABS: 5.0000 mg | ORAL_TABLET | Freq: Every day | ORAL | 3 refills | Status: DC

## 2017-07-23 NOTE — Patient Instructions (Signed)
Bernerd Pho, PA recommends that you continue on your current medications as directed. Please refer to the Current Medication list given to you today.  TAKE Furosemide (Lasix) as needed for edema.  Your physician recommends that you return for lab work TODAY.  Please keep your follow-up scheduled with Dr Meda Coffee in January.

## 2017-07-23 NOTE — Patient Outreach (Signed)
[facility]) Care Management  07/23/2017  [name] 18-Apr-1986 [id number]  Assessment- CSW completed call to [facility] office to follow up on patient's application but was unable to reach anyone. CSW left a detailed voice message requesting a return call with updates on patient's application status.  Plan-CSW will await for return call or complete another outreach attempt if needed.  Eula Fried, BSW, MSW, [email or web address] Phone: [phone] Fax: [phone]

## 2017-07-24 ENCOUNTER — Other Ambulatory Visit: Payer: Self-pay | Admitting: Licensed Clinical Social Worker

## 2017-07-24 ENCOUNTER — Other Ambulatory Visit: Payer: Self-pay

## 2017-07-24 DIAGNOSIS — R1013 Epigastric pain: Secondary | ICD-10-CM | POA: Diagnosis not present

## 2017-07-24 LAB — PRO B NATRIURETIC PEPTIDE: NT-Pro BNP: 24 pg/mL (ref 0–130)

## 2017-07-24 LAB — BASIC METABOLIC PANEL
BUN/Creatinine Ratio: 13 (ref 9–23)
BUN: 12 mg/dL (ref 6–20)
CO2: 23 mmol/L (ref 20–29)
Calcium: 9.6 mg/dL (ref 8.7–10.2)
Chloride: 102 mmol/L (ref 96–106)
Creatinine, Ser: 0.92 mg/dL (ref 0.57–1.00)
GFR calc Af Amer: 97 mL/min/{1.73_m2} (ref >59)
GFR calc non Af Amer: 84 mL/min/{1.73_m2} (ref >59)
Glucose: 85 mg/dL (ref 65–99)
Potassium: 4.4 mmol/L (ref 3.5–5.2)
Sodium: 143 mmol/L (ref 134–144)

## 2017-07-24 NOTE — Telephone Encounter (Signed)
Pt aware of lab results and recommendations per [place] PA-C.  Cancelled the pts upcoming echo appt, due to improved results and symptoms.  Pt verbalized understanding and pleased with these results.
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
## 2017-07-24 NOTE — Patient Outreach (Signed)
[facility]) Care Management  07/24/2017  [name] 1985/10/11 [id number]   Subjective: none  Objective: none  Assessment: 31 year old with hospitalization 9/10-9/12 for acute heart faillure post delivery. Client delivered baby girl on June 5th.    RNCM called for transition of care. No answer. HIPPA compliant message left.  Plan: follow up call next week if no return call.  Thea Silversmith, RN, MSN, [place] Coordinator Cell: [phone]

## 2017-07-24 NOTE — Patient Outreach (Signed)
[facility]) Care Management  07/24/2017  [name] 08-Sep-1986 [id number]  Assessment- CSW received return call from [name] with SCAT services. She reports that an assessment appointment was successfully scheduled for 08/05/17 at 10:30 am. CSW completed call to patient and confirmed this information. Patient was educated on the next steps to complete the application process for [facility]AT.  Plan-CSW will follow up within one week.  [name], BSW, MSW, [name] Phone: [phone] Fax: [phone]

## 2017-07-24 NOTE — Telephone Encounter (Signed)
Notes recorded by Erma Heritage, PA-C on 07/24/2017 at 10:05 AM EDT Please let the patient know her kidney function and electrolytes were stable. Please cancel the order for her repeat echocardiogram as lab results did not show any evidence of volume overload. Thank you.

## 2017-07-25 DIAGNOSIS — F411 Generalized anxiety disorder: Secondary | ICD-10-CM | POA: Diagnosis not present

## 2017-07-26 ENCOUNTER — Other Ambulatory Visit (HOSPITAL_COMMUNITY): Payer: Medicare HMO

## 2017-07-29 ENCOUNTER — Other Ambulatory Visit: Payer: Self-pay

## 2017-07-29 DIAGNOSIS — R079 Chest pain, unspecified: Secondary | ICD-10-CM | POA: Diagnosis not present

## 2017-07-29 NOTE — Patient Outreach (Signed)
[place] [facility]) Care Management  07/29/2017  [name] 1986/07/30 [id number]   Subjective: "I still have my good days and my bad days".  Objective: none  Assessment: 31 year old with hospitalization 9/10-9/12 for acute heart faillure post delivery. Client delivered baby girl on June 5th.    RNCM called follow up for transition of care. Client reports her blood pressure has been 130/80's, she reports she continues to weigh, but is on her cycle now. She has an upcoming blood pressure check at her cardiologist office on 11/2.   History of depression and anxiety. Client reports that she has started zoloft for depression and states it has helped. She also reports xanax for anxiety as needed is helping some.   Recent workup for Gastrointestinal complaints. Client reports she is on carafate due to stomach ulcer prescribed by her primary care physician. Client reports a follow up scheduled for 10/24 to primary care regarding stomach ulcer. She denies pain since starting the carafate.  Client encouraged to call RNCM with any questions or concerns.   Plan: follow up next month to see if client has any additional care management needs.  Thea Silversmith, RN, MSN, [place] Coordinator Cell: [phone]

## 2017-07-30 ENCOUNTER — Encounter (HOSPITAL_COMMUNITY): Payer: Self-pay | Admitting: Emergency Medicine

## 2017-07-30 ENCOUNTER — Emergency Department (HOSPITAL_COMMUNITY)
Admission: EM | Admit: 2017-07-30 | Discharge: 2017-07-30 | Disposition: A | Discharge status: Home / Self Care | Payer: Medicare HMO | Attending: Emergency Medicine | Admitting: Emergency Medicine

## 2017-07-30 ENCOUNTER — Emergency Department (HOSPITAL_COMMUNITY): Payer: Medicare HMO

## 2017-07-30 DIAGNOSIS — Z87891 Personal history of nicotine dependence: Secondary | ICD-10-CM | POA: Insufficient documentation

## 2017-07-30 DIAGNOSIS — Z79899 Other long term (current) drug therapy: Secondary | ICD-10-CM | POA: Insufficient documentation

## 2017-07-30 DIAGNOSIS — F1210 Cannabis abuse, uncomplicated: Secondary | ICD-10-CM | POA: Diagnosis not present

## 2017-07-30 DIAGNOSIS — F913 Oppositional defiant disorder: Secondary | ICD-10-CM | POA: Insufficient documentation

## 2017-07-30 DIAGNOSIS — F419 Anxiety disorder, unspecified: Secondary | ICD-10-CM | POA: Diagnosis not present

## 2017-07-30 DIAGNOSIS — J45909 Unspecified asthma, uncomplicated: Secondary | ICD-10-CM | POA: Insufficient documentation

## 2017-07-30 DIAGNOSIS — F909 Attention-deficit hyperactivity disorder, unspecified type: Secondary | ICD-10-CM | POA: Insufficient documentation

## 2017-07-30 DIAGNOSIS — F319 Bipolar disorder, unspecified: Secondary | ICD-10-CM | POA: Diagnosis not present

## 2017-07-30 DIAGNOSIS — R1010 Upper abdominal pain, unspecified: Secondary | ICD-10-CM | POA: Diagnosis not present

## 2017-07-30 DIAGNOSIS — R109 Unspecified abdominal pain: Secondary | ICD-10-CM | POA: Diagnosis present

## 2017-07-30 DIAGNOSIS — I10 Essential (primary) hypertension: Secondary | ICD-10-CM | POA: Insufficient documentation

## 2017-07-30 DIAGNOSIS — K8070 Calculus of gallbladder and bile duct without cholecystitis without obstruction: Secondary | ICD-10-CM

## 2017-07-30 LAB — URINALYSIS, ROUTINE W REFLEX MICROSCOPIC
Bacteria, UA: NONE SEEN
Bilirubin Urine: NEGATIVE
Glucose, UA: NEGATIVE mg/dL
Ketones, ur: NEGATIVE mg/dL
Nitrite: NEGATIVE
Protein, ur: NEGATIVE mg/dL
Specific Gravity, Urine: 1.018 (ref 1.005–1.030)
pH: 5 (ref 5.0–8.0)

## 2017-07-30 LAB — CBC
HCT: 39.2 % (ref 36.0–46.0)
Hemoglobin: 13.2 g/dL (ref 12.0–15.0)
MCH: 29 pg (ref 26.0–34.0)
MCHC: 33.7 g/dL (ref 30.0–36.0)
MCV: 86.2 fL (ref 78.0–100.0)
Platelets: 241 10*3/uL (ref 150–400)
RBC: 4.55 MIL/uL (ref 3.87–5.11)
RDW: 14.6 % (ref 11.5–15.5)
WBC: 8.2 10*3/uL (ref 4.0–10.5)

## 2017-07-30 LAB — COMPREHENSIVE METABOLIC PANEL
ALT: 73 U/L — ABNORMAL HIGH (ref 14–54)
AST: 65 U/L — ABNORMAL HIGH (ref 15–41)
Albumin: 3.9 g/dL (ref 3.5–5.0)
Alkaline Phosphatase: 123 U/L (ref 38–126)
Anion gap: 8 (ref 5–15)
BUN: 14 mg/dL (ref 6–20)
CO2: 26 mmol/L (ref 22–32)
Calcium: 9.1 mg/dL (ref 8.9–10.3)
Chloride: 105 mmol/L (ref 101–111)
Creatinine, Ser: 0.88 mg/dL (ref 0.44–1.00)
GFR calc Af Amer: >60 mL/min (ref >60)
GFR calc non Af Amer: >60 mL/min (ref >60)
Glucose, Bld: 111 mg/dL — ABNORMAL HIGH (ref 65–99)
Potassium: 3.6 mmol/L (ref 3.5–5.1)
Sodium: 139 mmol/L (ref 135–145)
Total Bilirubin: 0.6 mg/dL (ref 0.3–1.2)
Total Protein: 7.3 g/dL (ref 6.5–8.1)

## 2017-07-30 LAB — I-STAT BETA HCG BLOOD, ED (MC, WL, AP ONLY): I-stat hCG, quantitative: <5 m[IU]/mL (ref <5)

## 2017-07-30 LAB — LIPASE, BLOOD: Lipase: 32 U/L (ref 11–51)

## 2017-07-30 MED ORDER — ONDANSETRON HCL 4 MG/2ML IJ SOLN
4.0000 mg | Freq: Once | INTRAMUSCULAR | Status: AC
  Administered 2017-07-30: 4 mg via INTRAVENOUS
  Filled 2017-07-30: qty 2

## 2017-07-30 MED ORDER — SODIUM CHLORIDE 0.9 % IV BOLUS (SEPSIS)
1000.0000 mL | Freq: Once | INTRAVENOUS | Status: AC
  Administered 2017-07-30: 1000 mL via INTRAVENOUS

## 2017-07-30 MED ORDER — ONDANSETRON HCL 4 MG PO TABS: 4.0000 mg | ORAL_TABLET | Freq: Four times a day (QID) | ORAL | 0 refills | Status: DC

## 2017-07-30 MED ORDER — HYDROMORPHONE HCL 1 MG/ML IJ SOLN
1.0000 mg | Freq: Once | INTRAMUSCULAR | Status: AC
  Administered 2017-07-30: 1 mg via INTRAVENOUS
  Filled 2017-07-30: qty 1

## 2017-07-30 MED ORDER — ONDANSETRON 4 MG PO TBDP
4.0000 mg | ORAL_TABLET | Freq: Once | ORAL | Status: AC | PRN
  Administered 2017-07-30: 4 mg via ORAL
  Filled 2017-07-30: qty 1

## 2017-07-30 MED ORDER — HYDROCODONE-ACETAMINOPHEN 5-325 MG PO TABS: 1.0000 | ORAL_TABLET | ORAL | 0 refills | Status: DC | PRN

## 2017-07-30 NOTE — ED Triage Notes (Signed)
Patient called ems due to epigastric pain. Patient is having this pain in mid abdomen that radiates to her back. She states it started around 20:30

## 2017-07-30 NOTE — ED Notes (Signed)
Gave patient urine cup to urinate.
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
## 2017-07-31 ENCOUNTER — Other Ambulatory Visit: Payer: Self-pay

## 2017-07-31 ENCOUNTER — Other Ambulatory Visit: Payer: Self-pay | Admitting: Licensed Clinical Social Worker

## 2017-07-31 DIAGNOSIS — K8020 Calculus of gallbladder without cholecystitis without obstruction: Secondary | ICD-10-CM | POA: Diagnosis not present

## 2017-07-31 DIAGNOSIS — R740 Nonspecific elevation of levels of transaminase and lactic acid dehydrogenase [LDH]: Secondary | ICD-10-CM | POA: Diagnosis not present

## 2017-07-31 DIAGNOSIS — E559 Vitamin D deficiency, unspecified: Secondary | ICD-10-CM | POA: Diagnosis not present

## 2017-07-31 DIAGNOSIS — R1013 Epigastric pain: Secondary | ICD-10-CM | POA: Diagnosis not present

## 2017-07-31 DIAGNOSIS — Z720 Tobacco use: Secondary | ICD-10-CM | POA: Diagnosis not present

## 2017-07-31 DIAGNOSIS — J45909 Unspecified asthma, uncomplicated: Secondary | ICD-10-CM | POA: Diagnosis not present

## 2017-07-31 DIAGNOSIS — E785 Hyperlipidemia, unspecified: Secondary | ICD-10-CM | POA: Diagnosis not present

## 2017-07-31 DIAGNOSIS — R7303 Prediabetes: Secondary | ICD-10-CM | POA: Diagnosis not present

## 2017-07-31 DIAGNOSIS — I10 Essential (primary) hypertension: Secondary | ICD-10-CM | POA: Diagnosis not present

## 2017-07-31 DIAGNOSIS — R932 Abnormal findings on diagnostic imaging of liver and biliary tract: Secondary | ICD-10-CM | POA: Diagnosis not present

## 2017-07-31 NOTE — Patient Outreach (Signed)
[facility]) Care Management  07/31/2017  [name] 04-30-1986 [id number]  Subjective: client reports she is "at the [facility] office now."  Objective: none  Assessment: 31 year old with hospitalization 9/10-9/12 for acute heart faillure post delivery. Client delivered baby girl on June 5th. She has been to the emergency room on multiple occasions for increased blood pressure, but recently 10/23 for due to abdominal pain/gallstones.   RNCM called to follow up. Client reports ultrasound was done in the emergency room and that she was found to have gallstones. Client reports she had eaten some hamburger and she thinks that triggered her pain.   Client reports she is at her primary care office now and is getting a referral to gastroenterology.  RNCM encouraged client to refrain from fried foods, high fat foods, highly processed foods, whole milk/ dairy and fatty red meat.Client acknowledged understanding.  Client reports she has seen primary care provider and has a referral to gastro doctor  RNCM encouraged client to call RNCM with questions concerns. Also reinforced 24 hour nurse advice line.  Plan: follow up next week.  Thea Silversmith, RN, MSN, [place] Coordinator Cell: [phone]

## 2017-07-31 NOTE — Patient Outreach (Signed)
[facility]) Care Management  07/31/2017  [name] April 29, 1986 [id number]  Assessment-CSW completed outreach call to patient to follow up on ED visit yesterday. Patient answered. Patient had test completed at ED and multiple small stones were seen within the gallbladder. She reports being in a lot of pain and discomfort. Patient reports that she was instructed to be seen by PCP as soon as possible and that she is currently at PCP's office now waiting to be seen. Emotional support was provided over phone and patient was receptive.   Plan-CSW will follow up within one week and continue to provide social work support.  Eula Fried, BSW, MSW, [email or web address] Phone: [phone] Fax: [phone]

## 2017-08-01 ENCOUNTER — Encounter (HOSPITAL_COMMUNITY): Payer: Self-pay | Admitting: *Deleted

## 2017-08-01 ENCOUNTER — Other Ambulatory Visit: Payer: Self-pay | Admitting: Gastroenterology

## 2017-08-02 ENCOUNTER — Encounter (HOSPITAL_COMMUNITY): Payer: Self-pay

## 2017-08-02 ENCOUNTER — Ambulatory Visit (HOSPITAL_COMMUNITY): Payer: Medicare HMO | Admitting: Anesthesiology

## 2017-08-02 ENCOUNTER — Encounter (HOSPITAL_COMMUNITY)
Admission: RE | Disposition: A | Discharge status: Home / Self Care | Payer: Self-pay | Source: Ambulatory Visit | Attending: Gastroenterology

## 2017-08-02 ENCOUNTER — Ambulatory Visit (HOSPITAL_COMMUNITY)
Admission: RE | Admit: 2017-08-02 | Discharge: 2017-08-02 | Disposition: A | Discharge status: Home / Self Care | Payer: Medicare HMO | Source: Ambulatory Visit | Attending: Gastroenterology | Admitting: Gastroenterology

## 2017-08-02 DIAGNOSIS — I509 Heart failure, unspecified: Secondary | ICD-10-CM | POA: Diagnosis not present

## 2017-08-02 DIAGNOSIS — Z7951 Long term (current) use of inhaled steroids: Secondary | ICD-10-CM | POA: Insufficient documentation

## 2017-08-02 DIAGNOSIS — Z8249 Family history of ischemic heart disease and other diseases of the circulatory system: Secondary | ICD-10-CM | POA: Insufficient documentation

## 2017-08-02 DIAGNOSIS — I110 Hypertensive heart disease with heart failure: Secondary | ICD-10-CM | POA: Diagnosis not present

## 2017-08-02 DIAGNOSIS — R932 Abnormal findings on diagnostic imaging of liver and biliary tract: Secondary | ICD-10-CM | POA: Diagnosis not present

## 2017-08-02 DIAGNOSIS — R1013 Epigastric pain: Secondary | ICD-10-CM | POA: Diagnosis not present

## 2017-08-02 DIAGNOSIS — G4733 Obstructive sleep apnea (adult) (pediatric): Secondary | ICD-10-CM | POA: Diagnosis not present

## 2017-08-02 DIAGNOSIS — Z803 Family history of malignant neoplasm of breast: Secondary | ICD-10-CM | POA: Insufficient documentation

## 2017-08-02 DIAGNOSIS — F319 Bipolar disorder, unspecified: Secondary | ICD-10-CM | POA: Diagnosis not present

## 2017-08-02 DIAGNOSIS — F419 Anxiety disorder, unspecified: Secondary | ICD-10-CM | POA: Insufficient documentation

## 2017-08-02 DIAGNOSIS — Z79899 Other long term (current) drug therapy: Secondary | ICD-10-CM | POA: Insufficient documentation

## 2017-08-02 DIAGNOSIS — J45909 Unspecified asthma, uncomplicated: Secondary | ICD-10-CM | POA: Insufficient documentation

## 2017-08-02 DIAGNOSIS — Z87891 Personal history of nicotine dependence: Secondary | ICD-10-CM | POA: Insufficient documentation

## 2017-08-02 DIAGNOSIS — Z91018 Allergy to other foods: Secondary | ICD-10-CM | POA: Insufficient documentation

## 2017-08-02 DIAGNOSIS — Z9889 Other specified postprocedural states: Secondary | ICD-10-CM | POA: Insufficient documentation

## 2017-08-02 DIAGNOSIS — Z888 Allergy status to other drugs, medicaments and biological substances status: Secondary | ICD-10-CM | POA: Diagnosis not present

## 2017-08-02 DIAGNOSIS — Z833 Family history of diabetes mellitus: Secondary | ICD-10-CM | POA: Diagnosis not present

## 2017-08-02 DIAGNOSIS — Z800 Family history of malignant neoplasm of digestive organs: Secondary | ICD-10-CM | POA: Insufficient documentation

## 2017-08-02 DIAGNOSIS — K8020 Calculus of gallbladder without cholecystitis without obstruction: Secondary | ICD-10-CM | POA: Insufficient documentation

## 2017-08-02 HISTORY — DX: Headache, unspecified: R51.9

## 2017-08-02 HISTORY — DX: Family history of other specified conditions: Z84.89

## 2017-08-02 HISTORY — DX: Headache: R51

## 2017-08-02 HISTORY — PX: EUS: SHX5427

## 2017-08-02 SURGERY — UPPER ENDOSCOPIC ULTRASOUND (EUS) LINEAR
Anesthesia: General

## 2017-08-02 MED ORDER — MIDAZOLAM HCL 2 MG/2ML IJ SOLN
INTRAMUSCULAR | Status: AC
  Filled 2017-08-02: qty 2

## 2017-08-02 MED ORDER — ONDANSETRON HCL 4 MG/2ML IJ SOLN
INTRAMUSCULAR | Status: DC | PRN
  Administered 2017-08-02: 4 mg via INTRAVENOUS

## 2017-08-02 MED ORDER — DEXAMETHASONE SODIUM PHOSPHATE 4 MG/ML IJ SOLN
INTRAMUSCULAR | Status: DC | PRN
  Administered 2017-08-02: 10 mg via INTRAVENOUS

## 2017-08-02 MED ORDER — LIDOCAINE 2% (20 MG/ML) 5 ML SYRINGE
INTRAMUSCULAR | Status: AC
  Filled 2017-08-02: qty 5

## 2017-08-02 MED ORDER — ROCURONIUM BROMIDE 10 MG/ML (PF) SYRINGE
PREFILLED_SYRINGE | INTRAVENOUS | Status: DC | PRN
  Administered 2017-08-02: 40 mg via INTRAVENOUS

## 2017-08-02 MED ORDER — ONDANSETRON HCL 4 MG/2ML IJ SOLN
INTRAMUSCULAR | Status: AC
  Filled 2017-08-02: qty 2

## 2017-08-02 MED ORDER — SUGAMMADEX SODIUM 500 MG/5ML IV SOLN
INTRAVENOUS | Status: AC
  Filled 2017-08-02: qty 5

## 2017-08-02 MED ORDER — MIDAZOLAM HCL 5 MG/5ML IJ SOLN
INTRAMUSCULAR | Status: DC | PRN
  Administered 2017-08-02: 2 mg via INTRAVENOUS

## 2017-08-02 MED ORDER — LACTATED RINGERS IV SOLN
INTRAVENOUS | Status: DC | PRN
  Administered 2017-08-02: 12:00:00 via INTRAVENOUS

## 2017-08-02 MED ORDER — PROPOFOL 10 MG/ML IV BOLUS
INTRAVENOUS | Status: AC
  Filled 2017-08-02: qty 20

## 2017-08-02 MED ORDER — INDOMETHACIN 50 MG RE SUPP
RECTAL | Status: AC
  Filled 2017-08-02: qty 2

## 2017-08-02 MED ORDER — SUGAMMADEX SODIUM 500 MG/5ML IV SOLN
INTRAVENOUS | Status: DC | PRN
  Administered 2017-08-02: 400 mg via INTRAVENOUS

## 2017-08-02 MED ORDER — SUCCINYLCHOLINE CHLORIDE 200 MG/10ML IV SOSY
PREFILLED_SYRINGE | INTRAVENOUS | Status: AC
  Filled 2017-08-02: qty 10

## 2017-08-02 MED ORDER — LIDOCAINE 2% (20 MG/ML) 5 ML SYRINGE
INTRAMUSCULAR | Status: DC | PRN
  Administered 2017-08-02: 100 mg via INTRAVENOUS

## 2017-08-02 MED ORDER — PROPOFOL 10 MG/ML IV BOLUS
INTRAVENOUS | Status: DC | PRN
  Administered 2017-08-02: 400 mg via INTRAVENOUS

## 2017-08-02 MED ORDER — SUCCINYLCHOLINE CHLORIDE 200 MG/10ML IV SOSY
PREFILLED_SYRINGE | INTRAVENOUS | Status: DC | PRN
  Administered 2017-08-02: 140 mg via INTRAVENOUS

## 2017-08-02 MED ORDER — SODIUM CHLORIDE 0.9 % IV SOLN: INTRAVENOUS | Status: DC

## 2017-08-02 MED ORDER — FENTANYL CITRATE (PF) 100 MCG/2ML IJ SOLN
INTRAMUSCULAR | Status: DC | PRN
  Administered 2017-08-02: 50 ug via INTRAVENOUS

## 2017-08-02 MED ORDER — GLUCAGON HCL RDNA (DIAGNOSTIC) 1 MG IJ SOLR
INTRAMUSCULAR | Status: AC
  Filled 2017-08-02: qty 1

## 2017-08-02 MED ORDER — ROCURONIUM BROMIDE 50 MG/5ML IV SOSY
PREFILLED_SYRINGE | INTRAVENOUS | Status: AC
  Filled 2017-08-02: qty 5

## 2017-08-02 MED ORDER — FENTANYL CITRATE (PF) 100 MCG/2ML IJ SOLN
INTRAMUSCULAR | Status: AC
  Filled 2017-08-02: qty 2

## 2017-08-02 MED ORDER — DEXAMETHASONE SODIUM PHOSPHATE 10 MG/ML IJ SOLN
INTRAMUSCULAR | Status: AC
  Filled 2017-08-02: qty 1
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
## 2017-08-02 NOTE — Anesthesia Procedure Notes (Signed)
Procedure Name: Intubation Date/Time: 08/02/2017 12:13 PM Performed by: Carleene Cooper A Pre-anesthesia Checklist: Patient identified, Emergency Drugs available, Suction available, Patient being monitored and Timeout performed Patient Re-evaluated:Patient Re-evaluated prior to induction Oxygen Delivery Method: Circle system utilized Preoxygenation: Pre-oxygenation with 100% oxygen Induction Type: IV induction Ventilation: Mask ventilation without difficulty Laryngoscope Size: Mac and 4 Grade View: Grade I Tube type: Oral Tube size: 7.5 mm Number of attempts: 1 Airway Equipment and Method: Stylet Placement Confirmation: ETT inserted through vocal cords under direct vision,  positive ETCO2 and breath sounds checked- equal and bilateral Secured at: 21 cm Tube secured with: Tape Dental Injury: Teeth and Oropharynx as per pre-operative assessment

## 2017-08-02 NOTE — Transfer of Care (Signed)
Immediate Anesthesia Transfer of Care Note  Patient: [name]  Procedure(s) Performed: UPPER ENDOSCOPIC ULTRASOUND (EUS) LINEAR (N/A ) ENDOSCOPIC RETROGRADE CHOLANGIOPANCREATOGRAPHY (ERCP) (N/A )  Patient Location: PACU and Endoscopy Unit  Anesthesia Type:General  Level of Consciousness: awake, alert , oriented and patient cooperative  Airway & Oxygen Therapy: Patient Spontanous Breathing and Patient connected to face mask oxygen  Post-op Assessment: Report given to RN, Post -op Vital signs reviewed and stable and Patient moving all extremities  Post vital signs: Reviewed and stable  Last Vitals:  Vitals:   08/02/17 1043  BP: 117/64  Pulse: 79  Resp: (!) 22  Temp: 36.7 C  SpO2: 99%    Last Pain:  Vitals:   08/02/17 1043  TempSrc: Oral         Complications: No apparent anesthesia complications

## 2017-08-02 NOTE — Op Note (Signed)
[facility] Patient Name: [name] Procedure Date: 08/02/2017 MRN: [id number] Attending MD: Carol Ada , MD Date of Birth: 1985/12/12 CSN: [id number] Age: 31 Admit Type: Outpatient Procedure:                Upper EUS Indications:              Common bile duct dilation (etiology unknown) seen                            on CT scan Providers:                Carol Ada, MD, Angus Seller, Elspeth Cho                            Tech., Technician, Courtney Heys. [place], CRNA Referring MD:              Medicines:                General Anesthesia Complications:            No immediate complications. Estimated Blood Loss:     Estimated blood loss: none. Procedure:                Pre-Anesthesia Assessment:                           - Prior to the procedure, a History and Physical                            was performed, and patient medications and                            allergies were reviewed. The patient's tolerance of                            previous anesthesia was also reviewed. The risks                            and benefits of the procedure and the sedation                            options and risks were discussed with the patient.                            All questions were answered, and informed consent                            was obtained. Prior Anticoagulants: The patient has                            taken no previous anticoagulant or antiplatelet                            agents. ASA Grade Assessment: III - A patient with  severe systemic disease. After reviewing the risks                            and benefits, the patient was deemed in                            satisfactory condition to undergo the procedure.                           - Sedation was administered by an anesthesia                            professional. Deep sedation was attained.                           After obtaining informed consent,  the endoscope was                            passed under direct vision. Throughout the                            procedure, the patient's blood pressure, pulse, and                            oxygen saturations were monitored continuously. The                            [id number] ([id number]) scope was introduced through                            the mouth, and advanced to the second part of                            duodenum. The upper EUS was accomplished without                            difficulty. The patient tolerated the procedure                            well. Scope In: Scope Out: Findings:      Endoscopic Finding :      The examined esophagus was endoscopically normal.      The entire examined stomach was endoscopically normal.      The examined duodenum was endoscopically normal.      Endosonographic Finding :      Multiple stones were visualized endosonographically in the gallbladder       body. The stones were oval. They were hyperechoic and characterized by       shadowing.      The CBD ranged from 5 mm to 7.1 mm from distal to the mid CBD. There is       no evidence of choledocholithiasis. The PD was normal in caliber. Impression:               - Normal esophagus.                           -  Normal stomach.                           - Normal examined duodenum.                           - Multiple stones were visualized                            endosonographically in the gallbladder body.                           - No specimens collected. Moderate Sedation:      N/A- Per Anesthesia Care Recommendation:           - Patient has a contact number available for                            emergencies. The signs and symptoms of potential                            delayed complications were discussed with the                            patient. Return to normal activities tomorrow.                            Written discharge instructions were provided to the                             patient.                           - Resume regular diet.                           - Continue present medications.                           - Surgical consultation. Procedure Code(s):        --- Professional ---                           [phone], Esophagogastroduodenoscopy, flexible,                            transoral; with endoscopic ultrasound examination                            limited to the esophagus, stomach or duodenum, and                            adjacent structures Diagnosis Code(s):        --- Professional ---                           K80.20, Calculus of gallbladder without  cholecystitis without obstruction                           R93.2, Abnormal findings on diagnostic imaging of                            liver and biliary tract CPT copyright 2016 American Medical Association. All rights reserved. The codes documented in this report are preliminary and upon coder review may  be revised to meet current compliance requirements. Carol Ada, MD Carol Ada, MD 08/02/2017 1:07:54 PM This report has been signed electronically. Number of Addenda: 0

## 2017-08-02 NOTE — Anesthesia Postprocedure Evaluation (Signed)
Anesthesia Post Note  Patient: Abel Dianna Rossetti  Procedure(s) Performed: UPPER ENDOSCOPIC ULTRASOUND (EUS) LINEAR (N/A )     Patient location during evaluation: PACU Anesthesia Type: General Level of consciousness: awake and alert Pain management: pain level controlled Vital Signs Assessment: post-procedure vital signs reviewed and stable Respiratory status: spontaneous breathing, nonlabored ventilation and respiratory function stable Cardiovascular status: blood pressure returned to baseline and stable Postop Assessment: no apparent nausea or vomiting Anesthetic complications: no    Last Vitals:  Vitals:   08/02/17 1307 08/02/17 1310  BP: (!) 138/96 (!) 150/112  Pulse: 88 99  Resp: 19 (!) 22  Temp:    SpO2: 100% 97%    Last Pain:  Vitals:   08/02/17 1043  TempSrc: Oral                 Audry Pili

## 2017-08-02 NOTE — Anesthesia Preprocedure Evaluation (Addendum)
Anesthesia Evaluation  Patient identified by MRN, date of birth, ID band Patient awake    Reviewed: Allergy & Precautions, H&P , NPO status , Patient's Chart, lab work & pertinent test results  Airway Mallampati: II  TM Distance: >3 FB Neck ROM: full    Dental no notable dental hx. (+) Teeth Intact   Pulmonary asthma , sleep apnea , former smoker,    Pulmonary exam normal breath sounds clear to auscultation       Cardiovascular hypertension, CHF: postpartum.  Normal cardiovascular exam Rhythm:regular Rate:Normal  TTE 06/2017 - normal   Neuro/Psych  Headaches, Anxiety Depression Bipolar Disorder    GI/Hepatic negative GI ROS, Neg liver ROS,   Endo/Other  Morbid obesity  Renal/GU negative Renal ROS     Musculoskeletal   Abdominal (+) + obese,   Peds  Hematology negative hematology ROS (+)   Anesthesia Other Findings   Reproductive/Obstetrics                            Anesthesia Physical  Anesthesia Plan  ASA: III  Anesthesia Plan: General   Post-op Pain Management:    Induction: Intravenous  PONV Risk Score and Plan: 3 and Ondansetron, Dexamethasone, Midazolam and Treatment may vary due to age or medical condition  Airway Management Planned: Oral ETT  Additional Equipment: None  Intra-op Plan:   Post-operative Plan: Extubation in OR  Informed Consent: I have reviewed the patients History and Physical, chart, labs and discussed the procedure including the risks, benefits and alternatives for the proposed anesthesia with the patient or authorized representative who has indicated his/her understanding and acceptance.   Dental advisory given  Plan Discussed with: CRNA  Anesthesia Plan Comments:         Anesthesia Quick Evaluation

## 2017-08-02 NOTE — Discharge Instructions (Signed)
Esophagogastroduodenoscopy, Care After °Refer to this sheet in the next few weeks. These instructions provide you with information about caring for yourself after your procedure. Your health care provider may also give you more specific instructions. Your treatment has been planned according to current medical practices, but problems sometimes occur. Call your health care provider if you have any problems or questions after your procedure. °What can I expect after the procedure? °After the procedure, it is common to have: °· A sore throat. °· Nausea. °· Bloating. °· Dizziness. °· Fatigue. ° °Follow these instructions at home: °· Do not eat or drink anything until the numbing medicine (local anesthetic) has worn off and your gag reflex has returned. You will know that the local anesthetic has worn off when you can swallow comfortably. °· Do not drive for 24 hours if you received a medicine to help you relax (sedative). °· If your health care provider took a tissue sample for testing during the procedure, make sure to get your test results. This is your responsibility. Ask your health care provider or the department performing the test when your results will be ready. °· Keep all follow-up visits as told by your health care provider. This is important. °Contact a health care provider if: °· You cannot stop coughing. °· You are not urinating. °· You are urinating less than usual. °Get help right away if: °· You have trouble swallowing. °· You cannot eat or drink. °· You have throat or chest pain that gets worse. °· You are dizzy or light-headed. °· You faint. °· You have nausea or vomiting. °· You have chills. °· You have a fever. °· You have severe abdominal pain. °· You have black, tarry, or bloody stools. °This information is not intended to replace advice given to you by your health care provider. Make sure you discuss any questions you have with your health care provider. °Document Released: 09/10/2012 Document  Revised: 03/01/2016 Document Reviewed: 08/18/2015 °Elsevier Interactive Patient Education © 2018 Elsevier Inc. ° °

## 2017-08-05 ENCOUNTER — Encounter (HOSPITAL_COMMUNITY): Payer: Self-pay | Admitting: Gastroenterology

## 2017-08-05 ENCOUNTER — Other Ambulatory Visit: Payer: Self-pay | Admitting: Licensed Clinical Social Worker

## 2017-08-05 NOTE — Patient Outreach (Signed)
[facility]) Care Management  08/05/2017  [name] 08/06/1986 [id number]  Assessment- CSW completed call to patient to remind her of [facility] in person interview appointment today. CSW wanted to make sure that patient did not need to reschedule appointment. CSW unable to reach patient successfully but left a HIPPA compliant voice message encouraging a return call.  Plan-CSW will await for a return call or complete additional outreach attempt within one week.  Eula Fried, BSW, MSW, [email or web address] Phone: [phone] Fax: [phone]

## 2017-08-07 ENCOUNTER — Other Ambulatory Visit: Payer: Self-pay

## 2017-08-07 NOTE — Patient Outreach (Signed)
[facility]) Care Management  08/07/2017  [name] 1985/12/12 [id number]   Subjective: "I am doing alright" reports pain is intermittent. But denies pain at this time.  Objective: none  Assessment: 31 year old with hospitalization 9/10-9/12 for acute heart failure post delivery. Client delivered baby girl on June 5th. She has been to the emergency room on multiple occasions for increased blood pressure, but recently 10/23 for due to abdominal pain/gallstones.   Client reports she has seen Gastroenterologist and had a procedure. Client reports referral to surgeon for continued treatment.   History of cardiomyopathy post deliver. Client reports she continues to weigh self and has no significant change in weights. She reports her blood pressure last week was 117/80 and she has a follow up appointment for blood pressure check at her cardiologist office on Friday, November 2.   Client reports her children are doing "good". She denies any questions or concerns at this time. Transition of care program complete.  Plan: RNCM will follow up next month to assess for any additional care management needs.  RNCM encouraged client to call as needed. RNCM reinforced 24 hour nurse advice line available as needed.  Thea Silversmith, RN, MSN, [place] Coordinator Cell: [phone]

## 2017-08-08 NOTE — Progress Notes (Signed)
Patient ID: ALILA SOTERO                 DOB: Oct 31, 1985                      MRN: [id number]     HPI: [name] is a 31 y.o. female patient of [name] referred by Dr. Bernerd Pho to HTN clinic. PMH is significant for obesity, acute CHF, anxiety, asthma, bipolar 1 disorder, depression, HTN, and OSA. Patient experienced elevated blood pressure post partum on 07/05/17, including SBP in the 190s which led her to go to the ED, where she was switched from propranolol to amlodipine 5mg , she was no longer breastfeeding at this time.   Pt presents today with her husband and 2 children in good spirits. She reports tolerating her amlodipine well and took her dose 4 hours ago. She denies dizziness, blurred vision, or headaches. She checks her BP at home and readings range 110s-140s/80s - she is symptomatic when her systolic is in the 175Z. She will feel a tightness in her head or ringing in her ears. She is motivated to lose weight after her recent pregnancy and is walking every day with her husband. She inquires about being referred to a nutritionist. She reads labels and looks at sodium content although admits to eating pizza and chips recently. She does not drink any caffeine.   Current HTN meds: amlodipine 5mg  daily, furosemide 40mg  daily as needed (hasn't been needing it) Previously tried: propranolol (stopped due to low heart rate) BP goal: 130/80 mmHg  Family History: Diabetes in mother. Heart disease in paternal grandmother and grandfather. Breast cancer in her maternal grandmother and stomach cancer in her paternal grandfather.  Social History: Former smoker with 5 pack years, quit in 2016. Previous alcohol/cannibis use.  Diet: Honey nut Chex with milk or chips for snack, Breakfast 2 eggs with cheese, dinner is usually salmon or chicken. Cooking a lot at home. Reviewed foods that may have high hidden salt content, such as fast food, canned food, or frozen meals. She states that she  has been looking at the labels more. Avoids caffeine, drinks decaf coffee only.   Exercise: They have stairs at home that she goes up and down several times a day and uses the resistance band while she watches TV. Walks 77min - 1 hr each day with her husband. Discussed walking at a brisk pace to ensure a good workout.  Home BP readings: Patient checks her blood pressure at home when she feels off, which she describes as sometimes being a ringing in her ears or pressure in her head. BP readings typically run 117-145/80. She reported one low around 107/80 a couple days after her cycle began and states that she felt lightheaded at that time.  Wt Readings from Last 3 Encounters:  08/02/17 269 lb (122 kg)  07/30/17 269 lb (122 kg)  07/23/17 269 lb 14.4 oz (122.4 kg)   BP Readings from Last 3 Encounters:  08/02/17 (!) 142/90  07/30/17 123/68  07/23/17 130/88   Pulse Readings from Last 3 Encounters:  08/02/17 80  07/30/17 (!) 56  07/23/17 (!) 53    Renal function: Estimated Creatinine Clearance: 123.6 mL/min (by C-G formula based on SCr of 0.88 mg/dL).  Past Medical History:  Diagnosis Date  . Acute CHF ([place])    a. post-partum, possibly due to IV fluids. 2d echo normal.  . Anxiety   . Asthma   .  Bipolar 1 disorder ([place])   . Depression   . Family history of adverse reaction to anesthesia    3rd cousin died during laser eye surgery age 53  . Gallstones    pain  . Headache    h/o migraines none recent  . Hypertension   . OSA (obstructive sleep apnea) 01/28/2013   no cpap used  . Vaginal tear resulting from childbirth 06/12/2017    Current Outpatient Prescriptions on File Prior to Visit  Medication Sig Dispense Refill  . acetaminophen (TYLENOL) 325 MG tablet Take 650 mg by mouth every 6 (six) hours as needed for mild pain.    Marland Kitchen albuterol (PROVENTIL HFA;VENTOLIN HFA) 108 (90 BASE) MCG/ACT inhaler Inhale 2 puffs into the lungs every 6 (six) hours as needed for wheezing or shortness  of breath.    . ALPRAZolam (XANAX) 0.25 MG tablet Take 0.25 mg by mouth 3 (three) times daily as needed.   0  . amLODipine (NORVASC) 5 MG tablet Take 1 tablet (5 mg total) by mouth daily. 90 tablet 3  . diphenhydrAMINE (BENADRYL) 25 mg capsule Take 1 capsule (25 mg total) by mouth every 6 (six) hours as needed. (Patient not taking: Reported on 07/30/2017) 30 capsule 0  . Fluticasone-Salmeterol (ADVAIR) 250-50 MCG/DOSE AEPB Inhale 2 puffs into the lungs daily.    . furosemide (LASIX) 40 MG tablet Take 1 tablet (40 mg total) by mouth daily. 90 tablet 1  . HYDROcodone-acetaminophen (NORCO/VICODIN) 5-325 MG tablet Take 1-2 tablets by mouth every 4 (four) hours as needed for moderate pain. 10 tablet 0  . ibuprofen (ADVIL,MOTRIN) 200 MG tablet Take 400-600 mg by mouth every 6 (six) hours as needed for mild pain.    Marland Kitchen metoCLOPramide (REGLAN) 10 MG tablet Take 1 tablet (10 mg total) by mouth 3 (three) times daily as needed for nausea. (Patient not taking: Reported on 07/23/2017) 60 tablet 0  . NUVARING 0.12-0.015 MG/24HR vaginal ring Place 1 each vaginally every 28 (twenty-eight) days.   12  . ondansetron (ZOFRAN) 4 MG tablet Take 1 tablet (4 mg total) by mouth every 6 (six) hours. 12 tablet 0  . Prenatal Vit-Fe Fumarate-FA (PRENATAL MULTIVITAMIN) TABS tablet Take 1 tablet by mouth daily at 12 noon. (Patient not taking: Reported on 07/30/2017) 100 tablet 3  . sertraline (ZOLOFT) 50 MG tablet Take 1 tablet (50 mg total) by mouth daily. 30 tablet 5  . sucralfate (CARAFATE) 1 g tablet Take 1 g by mouth 4 (four) times daily -  with meals and at bedtime.     No current facility-administered medications on file prior to visit.     Allergies  Allergen Reactions  . Pineapple Swelling  . Strawberry Extract Swelling  . Divalproex Sodium Hives and Rash  . Haldol [Haloperidol] Palpitations and Rash    "difficulty breathing"     Assessment/Plan:  1. Hypertension - Blood pressure today was 124/84, which is  very close to her goal of <130/80. Will continue amlodipine 5mg  daily and furosemide 40mg  daily as needed. Patient is motivated to lose weight after recent pregnancy. Encouraged her to continue staying active and eat a low sodium diet. Referral to nutritionist has been made per patient request. Advised patient to continue monitoring her blood pressure at home and to contact us with any concerns. Follow up in hypertension clinic as needed.  Patient seen with Levonne Lapping, PharmD Candidate  Raelie Lohr E. Adib Wahba, PharmD, CPP, [place] [address], [place], [place] [address]  Phone: [phone]; Fax: [phone] 08/09/2017 11:36 AM

## 2017-08-09 ENCOUNTER — Ambulatory Visit (INDEPENDENT_AMBULATORY_CARE_PROVIDER_SITE_OTHER): Payer: Medicare HMO | Admitting: Pharmacist

## 2017-08-09 VITALS — BP 124/84 | HR 68

## 2017-08-09 DIAGNOSIS — I10 Essential (primary) hypertension: Secondary | ICD-10-CM

## 2017-08-09 NOTE — Patient Instructions (Signed)
It was nice to meet you today  Continue to take your amlodipine every day and your furosemide as needed  Your blood pressure goal is less than 130/80  Call clinic if you notice your readings become consistently elevated above 140/90  We will refer you to a nutritionist

## 2017-08-12 ENCOUNTER — Other Ambulatory Visit: Payer: Self-pay | Admitting: Licensed Clinical Social Worker

## 2017-08-12 NOTE — Patient Outreach (Signed)
[name] [name]) Care Management  08/12/2017  [name] [birth year] [id number]  Assessment- CSW completed outreach call to patient and she answered successfully. Patient reports that she was able to successfully attend [facility] assessment appointment. However, she was only approved for limited transportation services. Patient [place] and that [place] approved patient to get [place]. If patient is unable to make it to the [place] bus stop then she can use [facility] services but this service will only be provided for medical appointments and not non medical appointments. Patient has limited privileges with [facility] but is excited to gain free transportation whenever she uses the regular [employer] services. Patient understands that if she uses [facility] for medical appointments then it will be a $1.50 charge each way. Patient now has stable transportation to medical appointments. Transportation goal has been met and financial assistance has been provided to patient. Patient is agreeable to social work discharge at this time but is agreeable to contact CSW in the future in case other problems arise. CSW will close case at this time.  [facility] CM Care Plan Problem One     Most Recent Value  Care Plan Problem One  Lack of community resource information and support  Role Documenting the Problem One  Clinical Social Worker  Care Plan for Problem One  Active  [facility] CM Short Term Goal #1   Patient will gain transportation, financial, wellness and mental health resources within 30 days  [facility] CM Short Term Goal #1 Start Date  07/03/17  [facility] CM Short Term Goal #1 Met Date  08/12/17  Interventions for Short Term Goal #1  Goal met  [facility] CM Short Term Goal #2   Patient will gain stable transportation through [facility] within 30 days  [facility] CM Short Term Goal #2 Start Date  07/24/17  [facility] CM Short Term Goal #2 Met Date  08/12/17  Interventions for Short  Term Goal #2  Goal met. Patient can either use [employer] (FREE OF CHARGE) or use [facility] for medical appointments or during inclement weather     Plan-CSW will update [facility] RNCM and PCP that social worker will be closing case.  Eula Fried, BSW, [email or web address], [email or web address] Phone: [phone] Fax: [phone]

## 2017-08-14 DIAGNOSIS — K8020 Calculus of gallbladder without cholecystitis without obstruction: Secondary | ICD-10-CM | POA: Diagnosis not present

## 2017-08-14 DIAGNOSIS — R197 Diarrhea, unspecified: Secondary | ICD-10-CM | POA: Diagnosis not present

## 2017-08-14 DIAGNOSIS — K625 Hemorrhage of anus and rectum: Secondary | ICD-10-CM | POA: Diagnosis not present

## 2017-08-19 ENCOUNTER — Ambulatory Visit: Payer: Self-pay | Admitting: Surgery

## 2017-08-19 DIAGNOSIS — K8010 Calculus of gallbladder with chronic cholecystitis without obstruction: Secondary | ICD-10-CM | POA: Diagnosis not present

## 2017-08-19 NOTE — H&P (View-Only) (Signed)
[name] 08/19/2017 4:44 PM Location: [facility] Patient #: [id number] DOB: 1985-10-30 Married / Language: English / Race: Black or African American Female  History of Present Illness Adin Hector MD; 08/19/2017 5:44 PM) The patient is a 31 year old female who presents for evaluation of gall stones. Note for "Gall stones": ` ` ` Patient sent for surgical consultation at the request of Dr. Carol Ada  Chief Complaint: Symptomatic gallstones  The patient is a morbidly obese female has had intermittent attacks of upper abdominal pains. This started after she had delivered her child in early September. Her pregnancy was complicated with heart failure. Managed by cardiology. Has been on diuretics and blood pressure medicines. Weaning off the diuretics now. She gets intermittent attacks of upper abdominal pain. Tends to go to her right back and shoulder. Usually triggered by eating. She is trying to do a low fat diet but still gets a couple attacks a week. Last time it happened after having some chops steak at the [employer]. She just having some issues with rectal bleeding. She is due to get a colonoscopy a Dr. Benson [place]. Some point she had more severe pain and elevated liver function tests. Ultrasound showed gallstones. Concern of some common bile duct dilatation. She underwent upper endoscopy with EUS. No evidence of common bile duct stones. Held off on ERCP.  She has history of heartburn when she was pregnant. He is to take Tums for it. Burning on the back of her throat. Since she delivered her child, her heartburn is much less. These attacks do not feel like that. She normally can walk 20-30 minutes without difficulty. No history of abdominal surgeries. No personal nor family history of GI/colon cancer, inflammatory bowel disease, irritable bowel syndrome, allergy such as Celiac Sprue, dietary/dairy problems, colitis, ulcers nor gastritis. No recent  sick contacts/gastroenteritis. No travel outside the country. No changes in diet. No dysphagia to solids or liquids. No significant heartburn or reflux. No hematochezia, hematemesis, coffee ground emesis. No evidence of prior gastric/peptic ulceration.  (Review of systems as stated in this history (HPI) or in the review of systems. Otherwise all other 12 point ROS are negative)   Past Surgical History ([name], [place]; 08/19/2017 4:44 PM) Oral Surgery  Diagnostic Studies History ([name], [place]; 08/19/2017 4:44 PM) Mammogram never Pap Smear 1-5 years ago  Allergies ([name], [place]; 08/19/2017 4:45 PM) No Known Drug Allergies 08/19/2017 Allergies Reconciled  Medication History ([name], [place]; 08/19/2017 4:45 PM) ALPRAZolam (0.25MG  Tablet, Oral) Active. Hydrocodone-Acetaminophen (5-325MG  Tablet, Oral) Active. Advair Diskus (100-50MCG/DOSE Aero Pow Br Act, Inhalation) Active. AmLODIPine Besylate (5MG  Tablet, Oral) Active. Carafate (1GM/10ML Suspension, Oral) Active. Furosemide (40MG  Tablet, Oral) Active. Ibuprofen (600MG  Tablet, Oral) Active. Metoclopramide HCl (10MG  Tablet, Oral) Active. NuvaRing (0.12-0.015MG /24HR Ring, Vaginal) Active. Ondansetron HCl (4MG  Tablet, Oral) Active. Propranolol HCl (20MG  Tablet, Oral) Active. Sertraline HCl (50MG  Tablet, Oral) Active. Ventolin HFA (108 (90 Base)MCG/ACT Aerosol Soln, Inhalation) Active. Medications Reconciled  Social History ([name], [place]; 08/19/2017 4:44 PM) Alcohol use Remotely quit alcohol use. Illicit drug use Prefer to discuss with provider. No caffeine use Tobacco use Former smoker.  Family History ([name], [place]; 08/19/2017 4:44 PM) Anesthetic complications Family Members In General. Arthritis Father, Mother. Breast Cancer Family Members In General. Cerebrovascular Accident Family Members In [place] Members In  General. Colon Polyps Family Members In General, Father, Mother. Depression Family Members In General, Mother. Diabetes Mellitus Family Members  In General. Hypertension Family Members In General, Sister. Ischemic Bowel Disease Family Members In General. Migraine Headache Family Members In General. Rectal Cancer Family Members In General. Respiratory Condition Family Members In General.  Pregnancy / Birth History ([name], RMA; 08/19/2017 4:44 PM) Age at menarche 65 years. Gravida 3 Length (months) of breastfeeding 7-12 Maternal age 40-25 Para 2 Regular periods  Other Problems ([name], [place]; 08/19/2017 4:44 PM) Anxiety Disorder Asthma Cholelithiasis Congestive Heart Failure Depression High blood pressure Sleep Apnea     Review of Systems (Lisa A. Brown RMA; 08/19/2017 4:44 PM) General Not Present- Appetite Loss, Chills, Fatigue, Fever, Night Sweats, Weight Gain and Weight Loss. Skin Not Present- Change in Wart/Mole, Dryness, Hives, Jaundice, New Lesions, Non-Healing Wounds, Rash and Ulcer. HEENT Present- Ringing in the Ears and Seasonal Allergies. Not Present- Earache, Hearing Loss, Hoarseness, Nose Bleed, Oral Ulcers, Sinus Pain, Sore Throat, Visual Disturbances, Wears glasses/contact lenses and Yellow Eyes. Breast Not Present- Breast Mass, Breast Pain, Nipple Discharge and Skin Changes. Cardiovascular Not Present- Chest Pain, Difficulty Breathing Lying Down, Leg Cramps, Palpitations, Rapid Heart Rate, Shortness of Breath and Swelling of Extremities. Gastrointestinal Present- Bloody Stool, Difficulty Swallowing, Excessive gas, Nausea and Rectal Pain. Not Present- Abdominal Pain, Bloating, Change in Bowel Habits, Chronic diarrhea, Constipation, Gets full quickly at meals, Hemorrhoids, Indigestion and Vomiting. Female Genitourinary Not Present- Frequency, Nocturia, Painful Urination, Pelvic Pain and Urgency. Musculoskeletal Not Present-  Back Pain, Joint Pain, Joint Stiffness, Muscle Pain, Muscle Weakness and Swelling of Extremities. Neurological Not Present- Decreased Memory, Fainting, Headaches, Numbness, Seizures, Tingling, Tremor, Trouble walking and Weakness. Psychiatric Present- Anxiety, Bipolar and Fearful. Not Present- Change in Sleep Pattern, Depression and Frequent crying.  Vitals (Lisa A. Brown RMA; 08/19/2017 4:45 PM) 08/19/2017 4:44 PM Weight: 277.5 lb Height: 65in Body Surface Area: 2.27 m Body Mass Index: 46.18 kg/m  Temp.: 98.57F  Pulse: 92 (Regular)  BP: 132/86 (Sitting, Left Arm, Standard)      Physical Exam Adin Hector MD; 08/19/2017 5:02 PM)  General Mental Status-Alert. General Appearance-Not in acute distress, Not Sickly. Orientation-Oriented X3. Hydration-Well hydrated. Voice-Normal.  Integumentary Global Assessment Upon inspection and palpation of skin surfaces of the - Axillae: non-tender, no inflammation or ulceration, no drainage. and Distribution of scalp and body hair is normal. General Characteristics Temperature - normal warmth is noted.  Head and Neck Head-normocephalic, atraumatic with no lesions or palpable masses. Face Global Assessment - atraumatic, no absence of expression. Neck Global Assessment - no abnormal movements, no bruit auscultated on the right, no bruit auscultated on the left, no decreased range of motion, non-tender. Trachea-midline. Thyroid Gland Characteristics - non-tender.  Eye Eyeball - Left-Extraocular movements intact, No Nystagmus. Eyeball - Right-Extraocular movements intact, No Nystagmus. Cornea - Left-No Hazy. Cornea - Right-No Hazy. Sclera/Conjunctiva - Left-No scleral icterus, No Discharge. Sclera/Conjunctiva - Right-No scleral icterus, No Discharge. Pupil - Left-Direct reaction to light normal. Pupil - Right-Direct reaction to light normal.  ENMT Ears Pinna - Left - no drainage  observed, no generalized tenderness observed. Right - no drainage observed, no generalized tenderness observed. Nose and Sinuses External Inspection of the Nose - no destructive lesion observed. Inspection of the nares - Left - quiet respiration. Right - quiet respiration. Mouth and Throat Lips - Upper Lip - no fissures observed, no pallor noted. Lower Lip - no fissures observed, no pallor noted. Nasopharynx - no discharge present. Oral Cavity/Oropharynx - Tongue - no dryness observed. Oral Mucosa - no cyanosis observed. Hypopharynx - no  evidence of airway distress observed.  Chest and Lung Exam Inspection Movements - Normal and Symmetrical. Accessory muscles - No use of accessory muscles in breathing. Palpation Palpation of the chest reveals - Non-tender. Auscultation Breath sounds - Normal and Clear.  Cardiovascular Auscultation Rhythm - Regular. Murmurs & Other Heart Sounds - Auscultation of the heart reveals - No Murmurs and No Systolic Clicks.  Abdomen Inspection Inspection of the abdomen reveals - No Visible peristalsis and No Abnormal pulsations. Umbilicus - No Bleeding, No Urine drainage. Palpation/Percussion Palpation and Percussion of the abdomen reveal - Soft, Non Tender, No Rebound tenderness, No Rigidity (guarding) and No Cutaneous hyperesthesia. Note: Abdomen obese but soft. Not severely distended. No distasis recti. No umbilical or other anterior abdominal wall hernias  Female Genitourinary Sexual Maturity Tanner 5 - Adult hair pattern. Note: No vaginal bleeding nor discharge  Peripheral Vascular Upper Extremity Inspection - Left - No Cyanotic nailbeds, Not Ischemic. Right - No Cyanotic nailbeds, Not Ischemic.  Neurologic Neurologic evaluation reveals -normal attention span and ability to concentrate, able to name objects and repeat phrases. Appropriate fund of knowledge , normal sensation and normal coordination. Mental Status Affect - not angry, not  paranoid. Cranial Nerves-Normal Bilaterally. Gait-Normal.  Neuropsychiatric Mental status exam performed with findings of-able to articulate well with normal speech/language, rate, volume and coherence, thought content normal with ability to perform basic computations and apply abstract reasoning and no evidence of hallucinations, delusions, obsessions or homicidal/suicidal ideation.  Musculoskeletal Global Assessment Spine, Ribs and Pelvis - no instability, subluxation or laxity. Right Upper Extremity - no instability, subluxation or laxity.  Lymphatic Head & Neck  General Head & Neck Lymphatics: Bilateral - Description - No Localized lymphadenopathy. Axillary  General Axillary Region: Bilateral - Description - No Localized lymphadenopathy. Femoral & Inguinal  Generalized Femoral & Inguinal Lymphatics: Left - Description - No Localized lymphadenopathy. Right - Description - No Localized lymphadenopathy.    Assessment & Plan Adin Hector MD; 08/19/2017 5:10 PM)  CHRONIC CHOLECYSTITIS WITH CALCULUS (K80.10) Impression: Morbidly obese female with compensated heart failure with episodes of postprandial pain and increased liver function tests suspicious for transient choledocholithiasis and chronic cholecystitis.  Gastrointestinal she does not feel if she has any common bile duct stones at this time.  She keeps getting recurrent attacks.  I think she would benefit from cholecystectomy. Reasonable to start out single site with low threshold to switch to standard four-port given her morbid obesity.  I would like cardiac clearance first given her recent heart failure. Looks like she is compensated by would like to double check. Not needing diuretics nearly as much. Just on Norvasc at this point. Decent exercise tolerance.  Current Plans I recommended obtaining preoperative cardiac clearance. I am concerned about the health of the patient and the ability to tolerate the  operation. Therefore, we will request clearance by cardiology to better assess operative risk & see if a reevaluation, further workup, etc is needed. Also recommendations on how medications such as for anticoagulation and blood pressure should be managed/held/restarted after surgery. You are being scheduled for surgery- Our schedulers will call you.  You should hear from our office's scheduling department within 5 working days about the location, date, and time of surgery. We try to make accommodations for patient's preferences in scheduling surgery, but sometimes the OR schedule or the surgeon's schedule prevents [place] from making those accommodations.  If you have not heard from our office [phone]) in 5 working days, call the office and ask  for your surgeon's nurse.  If you have other questions about your diagnosis, plan, or surgery, call the office and ask for your surgeon's nurse.  Written instructions provided Pt Education - Pamphlet Given - Laparoscopic Gallbladder Surgery: discussed with patient and provided information. The anatomy & physiology of hepatobiliary & pancreatic function was discussed. The pathophysiology of gallbladder dysfunction was discussed. Natural history risks without surgery was discussed. I feel the risks of no intervention will lead to serious problems that outweigh the operative risks; therefore, I recommended cholecystectomy to remove the pathology. I explained laparoscopic techniques with possible need for an open approach. Probable cholangiogram to evaluate the bilary tract was explained as well.  Risks such as bleeding, infection, abscess, leak, injury to other organs, need for further treatment, heart attack, death, and other risks were discussed. I noted a good likelihood this will help address the problem. Possibility that this will not correct all abdominal symptoms was explained. Goals of post-operative recovery were discussed as well. We will  work to minimize complications. An educational handout further explaining the pathology and treatment options was given as well. Questions were answered. The patient expresses understanding & wishes to proceed with surgery.  Pt Education - [facility] Laparosopic Post Op HCI (Seidy Labreck) Pt Education - [facility] Good Bowel Health (Amberlie Gaillard) Pt Education - Laparoscopic Cholecystectomy: gallbladder  Adin Hector, M.D., F.A.C.S. Gastrointestinal and Minimally Invasive Surgery [facility] [address] [place], [place] [id number] [phone] Main / Paging

## 2017-08-19 NOTE — H&P (Signed)
[name] 08/19/2017 4:44 PM Location: [facility] Patient #: [id number] DOB: [birth year] Married / Language: English / Race: Black or African American Female  History of Present Illness Adin Hector MD; 08/19/2017 5:44 PM) The patient is a 31 year old female who presents for evaluation of gall stones. Note for "Gall stones": ` ` ` Patient sent for surgical consultation at the request of Dr. Carol Ada  Chief Complaint: Symptomatic gallstones  The patient is a morbidly obese female has had intermittent attacks of upper abdominal pains. This started after she had delivered her child in early September. Her pregnancy was complicated with heart failure. Managed by cardiology. Has been on diuretics and blood pressure medicines. Weaning off the diuretics now. She gets intermittent attacks of upper abdominal pain. Tends to go to her right back and shoulder. Usually triggered by eating. She is trying to do a low fat diet but still gets a couple attacks a week. Last time it happened after having some chops steak at the [employer]. She just having some issues with rectal bleeding. She is due to get a colonoscopy a Dr. Benson [place]. Some point she had more severe pain and elevated liver function tests. Ultrasound showed gallstones. Concern of some common bile duct dilatation. She underwent upper endoscopy with EUS. No evidence of common bile duct stones. Held off on ERCP.  She has history of heartburn when she was pregnant. He is to take Tums for it. Burning on the back of her throat. Since she delivered her child, her heartburn is much less. These attacks do not feel like that. She normally can walk 20-30 minutes without difficulty. No history of abdominal surgeries. No personal nor family history of GI/colon cancer, inflammatory bowel disease, irritable bowel syndrome, allergy such as Celiac Sprue, dietary/dairy problems, colitis, ulcers nor gastritis. No recent  sick contacts/gastroenteritis. No travel outside the country. No changes in diet. No dysphagia to solids or liquids. No significant heartburn or reflux. No hematochezia, hematemesis, coffee ground emesis. No evidence of prior gastric/peptic ulceration.  (Review of systems as stated in this history (HPI) or in the review of systems. Otherwise all other 12 point ROS are negative)   Past Surgical History ([name], [place]; 08/19/2017 4:44 PM) Oral Surgery  Diagnostic Studies History ([name], [place]; 08/19/2017 4:44 PM) Mammogram never Pap Smear 1-5 years ago  Allergies ([name], [place]; 08/19/2017 4:45 PM) No Known Drug Allergies 08/19/2017 Allergies Reconciled  Medication History ([name], [place]; 08/19/2017 4:45 PM) ALPRAZolam (0.25MG  Tablet, Oral) Active. Hydrocodone-Acetaminophen (5-325MG  Tablet, Oral) Active. Advair Diskus (100-50MCG/DOSE Aero Pow Br Act, Inhalation) Active. AmLODIPine Besylate (5MG  Tablet, Oral) Active. Carafate (1GM/10ML Suspension, Oral) Active. Furosemide (40MG  Tablet, Oral) Active. Ibuprofen (600MG  Tablet, Oral) Active. Metoclopramide HCl (10MG  Tablet, Oral) Active. NuvaRing (0.12-0.015MG /24HR Ring, Vaginal) Active. Ondansetron HCl (4MG  Tablet, Oral) Active. Propranolol HCl (20MG  Tablet, Oral) Active. Sertraline HCl (50MG  Tablet, Oral) Active. Ventolin HFA (108 (90 Base)MCG/ACT Aerosol Soln, Inhalation) Active. Medications Reconciled  Social History ([name], [place]; 08/19/2017 4:44 PM) Alcohol use Remotely quit alcohol use. Illicit drug use Prefer to discuss with provider. No caffeine use Tobacco use Former smoker.  Family History ([name], [place]; 08/19/2017 4:44 PM) Anesthetic complications Family Members In General. Arthritis Father, Mother. Breast Cancer Family Members In General. Cerebrovascular Accident Family Members In [place] Members In  General. Colon Polyps Family Members In General, Father, Mother. Depression Family Members In General, Mother. Diabetes Mellitus Family Members  In General. Hypertension Family Members In General, Sister. Ischemic Bowel Disease Family Members In General. Migraine Headache Family Members In General. Rectal Cancer Family Members In General. Respiratory Condition Family Members In General.  Pregnancy / Birth History ([name], RMA; 08/19/2017 4:44 PM) Age at menarche 39 years. Gravida 3 Length (months) of breastfeeding 7-12 Maternal age 3-25 Para 2 Regular periods  Other Problems ([name], [place]; 08/19/2017 4:44 PM) Anxiety Disorder Asthma Cholelithiasis Congestive Heart Failure Depression High blood pressure Sleep Apnea     Review of Systems ([name] RMA; 08/19/2017 4:44 PM) General Not Present- Appetite Loss, Chills, Fatigue, Fever, Night Sweats, Weight Gain and Weight Loss. Skin Not Present- Change in Wart/Mole, Dryness, Hives, Jaundice, New Lesions, Non-Healing Wounds, Rash and Ulcer. HEENT Present- Ringing in the Ears and Seasonal Allergies. Not Present- Earache, Hearing Loss, Hoarseness, Nose Bleed, Oral Ulcers, Sinus Pain, Sore Throat, Visual Disturbances, Wears glasses/contact lenses and Yellow Eyes. Breast Not Present- Breast Mass, Breast Pain, Nipple Discharge and Skin Changes. Cardiovascular Not Present- Chest Pain, Difficulty Breathing Lying Down, Leg Cramps, Palpitations, Rapid Heart Rate, Shortness of Breath and Swelling of Extremities. Gastrointestinal Present- Bloody Stool, Difficulty Swallowing, Excessive gas, Nausea and Rectal Pain. Not Present- Abdominal Pain, Bloating, Change in Bowel Habits, Chronic diarrhea, Constipation, Gets full quickly at meals, Hemorrhoids, Indigestion and Vomiting. Female Genitourinary Not Present- Frequency, Nocturia, Painful Urination, Pelvic Pain and Urgency. Musculoskeletal Not Present-  Back Pain, Joint Pain, Joint Stiffness, Muscle Pain, Muscle Weakness and Swelling of Extremities. Neurological Not Present- Decreased Memory, Fainting, Headaches, Numbness, Seizures, Tingling, Tremor, Trouble walking and Weakness. Psychiatric Present- Anxiety, Bipolar and Fearful. Not Present- Change in Sleep Pattern, Depression and Frequent crying.  Vitals ([name] RMA; 08/19/2017 4:45 PM) 08/19/2017 4:44 PM Weight: 277.5 lb Height: 65in Body Surface Area: 2.27 m Body Mass Index: 46.18 kg/m  Temp.: 98.72F  Pulse: 92 (Regular)  BP: 132/86 (Sitting, Left Arm, Standard)      Physical Exam Adin Hector MD; 08/19/2017 5:02 PM)  General Mental Status-Alert. General Appearance-Not in acute distress, Not Sickly. Orientation-Oriented X3. Hydration-Well hydrated. Voice-Normal.  Integumentary Global Assessment Upon inspection and palpation of skin surfaces of the - Axillae: non-tender, no inflammation or ulceration, no drainage. and Distribution of scalp and body hair is normal. General Characteristics Temperature - normal warmth is noted.  Head and Neck Head-normocephalic, atraumatic with no lesions or palpable masses. Face Global Assessment - atraumatic, no absence of expression. Neck Global Assessment - no abnormal movements, no bruit auscultated on the right, no bruit auscultated on the left, no decreased range of motion, non-tender. Trachea-midline. Thyroid Gland Characteristics - non-tender.  Eye Eyeball - Left-Extraocular movements intact, No Nystagmus. Eyeball - Right-Extraocular movements intact, No Nystagmus. Cornea - Left-No Hazy. Cornea - Right-No Hazy. Sclera/Conjunctiva - Left-No scleral icterus, No Discharge. Sclera/Conjunctiva - Right-No scleral icterus, No Discharge. Pupil - Left-Direct reaction to light normal. Pupil - Right-Direct reaction to light normal.  ENMT Ears Pinna - Left - no drainage  observed, no generalized tenderness observed. Right - no drainage observed, no generalized tenderness observed. Nose and Sinuses External Inspection of the Nose - no destructive lesion observed. Inspection of the nares - Left - quiet respiration. Right - quiet respiration. Mouth and Throat Lips - Upper Lip - no fissures observed, no pallor noted. Lower Lip - no fissures observed, no pallor noted. Nasopharynx - no discharge present. Oral Cavity/Oropharynx - Tongue - no dryness observed. Oral Mucosa - no cyanosis observed. Hypopharynx - no  evidence of airway distress observed.  Chest and Lung Exam Inspection Movements - Normal and Symmetrical. Accessory muscles - No use of accessory muscles in breathing. Palpation Palpation of the chest reveals - Non-tender. Auscultation Breath sounds - Normal and Clear.  Cardiovascular Auscultation Rhythm - Regular. Murmurs & Other Heart Sounds - Auscultation of the heart reveals - No Murmurs and No Systolic Clicks.  Abdomen Inspection Inspection of the abdomen reveals - No Visible peristalsis and No Abnormal pulsations. Umbilicus - No Bleeding, No Urine drainage. Palpation/Percussion Palpation and Percussion of the abdomen reveal - Soft, Non Tender, No Rebound tenderness, No Rigidity (guarding) and No Cutaneous hyperesthesia. Note: Abdomen obese but soft. Not severely distended. No distasis recti. No umbilical or other anterior abdominal wall hernias  Female Genitourinary Sexual Maturity Tanner 5 - Adult hair pattern. Note: No vaginal bleeding nor discharge  Peripheral Vascular Upper Extremity Inspection - Left - No Cyanotic nailbeds, Not Ischemic. Right - No Cyanotic nailbeds, Not Ischemic.  Neurologic Neurologic evaluation reveals -normal attention span and ability to concentrate, able to name objects and repeat phrases. Appropriate fund of knowledge , normal sensation and normal coordination. Mental Status Affect - not angry, not  paranoid. Cranial Nerves-Normal Bilaterally. Gait-Normal.  Neuropsychiatric Mental status exam performed with findings of-able to articulate well with normal speech/language, rate, volume and coherence, thought content normal with ability to perform basic computations and apply abstract reasoning and no evidence of hallucinations, delusions, obsessions or homicidal/suicidal ideation.  Musculoskeletal Global Assessment Spine, Ribs and Pelvis - no instability, subluxation or laxity. Right Upper Extremity - no instability, subluxation or laxity.  Lymphatic Head & Neck  General Head & Neck Lymphatics: Bilateral - Description - No Localized lymphadenopathy. Axillary  General Axillary Region: Bilateral - Description - No Localized lymphadenopathy. Femoral & Inguinal  Generalized Femoral & Inguinal Lymphatics: Left - Description - No Localized lymphadenopathy. Right - Description - No Localized lymphadenopathy.    Assessment & Plan Adin Hector MD; 08/19/2017 5:10 PM)  CHRONIC CHOLECYSTITIS WITH CALCULUS (K80.10) Impression: Morbidly obese female with compensated heart failure with episodes of postprandial pain and increased liver function tests suspicious for transient choledocholithiasis and chronic cholecystitis.  Gastrointestinal she does not feel if she has any common bile duct stones at this time.  She keeps getting recurrent attacks.  I think she would benefit from cholecystectomy. Reasonable to start out single site with low threshold to switch to standard four-port given her morbid obesity.  I would like cardiac clearance first given her recent heart failure. Looks like she is compensated by would like to double check. Not needing diuretics nearly as much. Just on Norvasc at this point. Decent exercise tolerance.  Current Plans I recommended obtaining preoperative cardiac clearance. I am concerned about the health of the patient and the ability to tolerate the  operation. Therefore, we will request clearance by cardiology to better assess operative risk & see if a reevaluation, further workup, etc is needed. Also recommendations on how medications such as for anticoagulation and blood pressure should be managed/held/restarted after surgery. You are being scheduled for surgery- Our schedulers will call you.  You should hear from our office's scheduling department within 5 working days about the location, date, and time of surgery. We try to make accommodations for patient's preferences in scheduling surgery, but sometimes the OR schedule or the surgeon's schedule prevents [place] from making those accommodations.  If you have not heard from our office [phone]) in 5 working days, call the office and ask  for your surgeon's nurse.  If you have other questions about your diagnosis, plan, or surgery, call the office and ask for your surgeon's nurse.  Written instructions provided Pt Education - Pamphlet Given - Laparoscopic Gallbladder Surgery: discussed with patient and provided information. The anatomy & physiology of hepatobiliary & pancreatic function was discussed. The pathophysiology of gallbladder dysfunction was discussed. Natural history risks without surgery was discussed. I feel the risks of no intervention will lead to serious problems that outweigh the operative risks; therefore, I recommended cholecystectomy to remove the pathology. I explained laparoscopic techniques with possible need for an open approach. Probable cholangiogram to evaluate the bilary tract was explained as well.  Risks such as bleeding, infection, abscess, leak, injury to other organs, need for further treatment, heart attack, death, and other risks were discussed. I noted a good likelihood this will help address the problem. Possibility that this will not correct all abdominal symptoms was explained. Goals of post-operative recovery were discussed as well. We will  work to minimize complications. An educational handout further explaining the pathology and treatment options was given as well. Questions were answered. The patient expresses understanding & wishes to proceed with surgery.  Pt Education - CCS Laparosopic Post Op HCI (Aubrielle Stroud) Pt Education - [facility] Good Bowel Health (Mozella Rexrode) Pt Education - Laparoscopic Cholecystectomy: gallbladder  Adin Hector, M.D., F.A.C.S. Gastrointestinal and Minimally Invasive Surgery [facility] [address] [place], [place] [address] [phone] Main / Paging

## 2017-08-26 ENCOUNTER — Encounter (HOSPITAL_BASED_OUTPATIENT_CLINIC_OR_DEPARTMENT_OTHER): Payer: Self-pay | Admitting: *Deleted

## 2017-08-27 ENCOUNTER — Encounter (HOSPITAL_BASED_OUTPATIENT_CLINIC_OR_DEPARTMENT_OTHER): Payer: Self-pay | Admitting: *Deleted

## 2017-08-27 DIAGNOSIS — I10 Essential (primary) hypertension: Secondary | ICD-10-CM | POA: Diagnosis not present

## 2017-08-27 DIAGNOSIS — E559 Vitamin D deficiency, unspecified: Secondary | ICD-10-CM | POA: Diagnosis not present

## 2017-08-27 DIAGNOSIS — E785 Hyperlipidemia, unspecified: Secondary | ICD-10-CM | POA: Diagnosis not present

## 2017-08-27 DIAGNOSIS — Z0000 Encounter for general adult medical examination without abnormal findings: Secondary | ICD-10-CM | POA: Diagnosis not present

## 2017-08-27 DIAGNOSIS — Z720 Tobacco use: Secondary | ICD-10-CM | POA: Diagnosis not present

## 2017-08-27 DIAGNOSIS — R7303 Prediabetes: Secondary | ICD-10-CM | POA: Diagnosis not present

## 2017-08-27 DIAGNOSIS — J45909 Unspecified asthma, uncomplicated: Secondary | ICD-10-CM | POA: Diagnosis not present

## 2017-08-27 DIAGNOSIS — R1013 Epigastric pain: Secondary | ICD-10-CM | POA: Diagnosis not present

## 2017-08-27 NOTE — Progress Notes (Signed)
Called pt via phone today to do pre-op interview.  Per pt will need to reschedule because her two month old baby has appointment that same day at [facility].  Per pt she knows she needs the surgery stated her has been ok since she completely changed her diet.  Gave pt Dr Johney Maine or scheduler name and phone number to call and reschedule today.

## 2017-09-04 ENCOUNTER — Other Ambulatory Visit: Payer: Self-pay

## 2017-09-04 ENCOUNTER — Ambulatory Visit: Payer: Self-pay

## 2017-09-04 NOTE — Patient Outreach (Signed)
[facility]) Care Management  09/04/2017  [place] KIMBERLY NIELAND 1986-06-28 [id number]   RNCM called to follow up to assess if client has any additional care management needs. No answer. HIPPA compliant message left.  Plan: follow up within the next 1-2 weeks if no return call.  Thea Silversmith, RN, MSN, [place] Coordinator Cell: [phone]

## 2017-09-06 ENCOUNTER — Other Ambulatory Visit: Payer: Self-pay

## 2017-09-06 NOTE — Patient Outreach (Signed)
[facility]) Care Management  09/06/2017  [name] September 03, 1986 [id number]  Subjective: "I am not having any shortness of breath today".  Objective: none  Assessment: 31 year old with hospitalization 9/10-9/12 for acute heart failure post delivery. Client delivered baby girl on June 5th. Emergency room visit: for increased blood pressure; 10/23 for due to abdominal pain/gallstones.   RNCM called to follow up and assess if any additional care management needs. Client reports having some shortness of breath on yesterday along with some swelling of her ankles. She states she used her inhaler first to see if that would relieve her shortness of breath. She states it helped some, but also took a prn Lasix dose after she was not completed relieved with use of inhaler. She reports shortness of breath completely relieved after taking Lasix. She denies shortness of breath today. [name] states that she was taken off amlodipine and started on propranolol by primary care due to an episode of fast heart beat. RNCM encouraged client to notify cardiologist of these events. Client states she will call to inform cardiologist.  She reports she continues to take her blood pressure and states it is within normal range for her. She states she also continues to weigh self. She reports her weight today was 274 pounds with clothes/hoodie and shoes on.   Per [name], she is scheduled to have her gallbladder removed next week.  Plan: continue to follow. RNCM will call post procedure telephonically next week. Home visit scheduled the following week.  Thea Silversmith, RN, MSN, [place] Coordinator Cell: [phone]

## 2017-09-09 ENCOUNTER — Encounter: Payer: Self-pay | Admitting: Registered"

## 2017-09-09 ENCOUNTER — Encounter: Payer: Medicare HMO | Attending: Internal Medicine | Admitting: Registered"

## 2017-09-09 DIAGNOSIS — I10 Essential (primary) hypertension: Secondary | ICD-10-CM | POA: Insufficient documentation

## 2017-09-09 DIAGNOSIS — Z713 Dietary counseling and surveillance: Secondary | ICD-10-CM | POA: Diagnosis not present

## 2017-09-09 NOTE — Progress Notes (Signed)
Medical Nutrition Therapy:  Appt start time: 1107 end time:  1222.   Assessment:  Primary concerns today: Pt referred due to essential hypertension. Pt reports she would like to know what she should eat regarding portion sizes and how to assess foods she purchases in the grocery store. She reports she tries to include more vegetables, fruits, and lean protein, and tries to avoid red meat, greasy foods and foods high in salt. Pt reports she struggles with understanding what to look for on nutrition labels. Pt reports high fat meats aggravate her gallbladder. Pt is scheduled to have her gallbladder removed 12/5. Pt reports she has a lot of concerns regarding her health, specifically about heart failure. Pt reports concerns regarding having enough money to buy healthy food at times. 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 Pt reports she sometimes struggles with anxiety and depression. She reports she has thought about seeing a Social worker.    Food allergies/intolerances: strawberries and pineapple. Pt reports she can tolerate a small amount of these fruits but if she consumes a large amount it causes her tongue and throat to swell. Pt reports she believes she is allergic to pollen and sometimes will start itching when walking outside and she believes it is due to something she touches that she is allergic to, but she has not been able to identify what is causing the reaction.   Preferred Learning Style:   No preference indicated   Learning Readiness:   Ready  MEDICATIONS: See list.    DIETARY INTAKE:  Usual eating pattern includes 2-3 meals per day and typically does not snack. Meals eaten at home are eaten on the sofa and the TV is usually on during mealtime. Pt reports that she sits on the sofa at meals and her husband and daughter sit at the table because they do not have enough space to all sit together.   Everyday foods vegetables.   Avoided foods include greasy and salty foods.    24-hr recall:  B (8-9 AM): cooked spinach, a little oatmeal, Gingerale, sweet tea, water   Snk ( AM): None reported.    L ( PM): None reported.  Snk ( PM): 1 cup of sweet tea; 1 cup of water, 1 piece of chocolate chip cake   D (830-9 PM): cooked spinach, 2 baked chicken wings with hot sauce, pinto beans  Snk ( PM): None reported.  Beverages: ~3 cups sweet tea, ~1 cup Gingerale, ~24-50 oz per day   Usual physical activity: Pt reports she likes to go walking, but has not been able to walk regularly since having health problems. Sometimes uses her resistance band.    Progress Towards Goal(s):  In progress.   Nutritional Diagnosis:  NB-1.1 Food and nutrition-related knowledge deficit As related to reading nutrition labels .  As evidenced by pt reporting she does not know what to look for on a nutrition label. NI-5.11.1 Predicted suboptimal nutrient intake As related to skipping meals .  As evidenced by pt's reported dietary recall and habits.    Intervention:  Nutrition counseling provided. Dietitian provided education regarding balanced, heart healthy nutrition. Dietitian discussed the importance of getting in consistent meals/avoiding skipping meals and how skipping meals slows metabolism and can lead to weight gain. Dietitian provided education on mindful eating and reading nutrition labels with emphasis on assessing sodium and saturated fat content of foods. Encouraged pt to get in regular physical  activity and discussed gradually increasing amount of activity. Encouraged pt to talk with her doctor regarding her past dx of heart failure as pt has many concerns about it. Also encouraged pt to talk to her doctor about seeing an allergist regarding food allergies. Encouraged pt to see a counselor to help with anxiety and depression. Pt appeared agreeable to information/goals discussed.   Goals/Instructions:   Make sure to get in three meals per  day. Try to have balanced meals like the My Plate example (see handout). Try to include more vegetables, fruits, and whole grains at meals.   Continue being mindful of sodium content in foods and try to select foods low in sodium most of the time (see handout). Try to select heart healthy foods (see handout).   Practice Mindful Eating  At meal and snack times, put away electronics (TV, phone, tablet, etc.) and try to eat seated at a table so you can better focus on eating your meal/snack and promote listening to your body's fullness and hunger signals.  If you feel that you are wanting to snack because your are bored or due to emotions and not because you are hungry, try to do a fun activity (read a book, take a walk, talk with a friend, etc.) for at least 20 minutes.    For help with meal planning: [employer]tn  For assistance with healthy recipes: https://whatscooking.CustomizedRugs.fi  Calorieking.com -for information on nutrition in foods at restaurants   Make physical activity a part of your week. Try to include at least 30 minutes of physical activity 5 days each week or at least 150 minutes per week. Regular physical activity promotes overall health-including helping to reduce risk for heart disease and diabetes, promoting mental health, and helping [place] sleep better.  -Walking can be a great way to get in more activity. Recommend gradually adding in another day of activity to reach at least 30 minutes of activity 5 days per week.   Teaching Method Utilized:  Visual Auditory  Handouts given during visit include:  Balanced plate with food list   Nutrition Therapy for Hypertension   Low Sodium Nutrition Therapy   How to Read a Nutrition Label   Salt Free Seasonings   [place] with resources for food assistance   List of local counselors   Barriers to learning/adherence to lifestyle change: Limited food budget  Demonstrated degree  of understanding via:  Teach Back   Monitoring/Evaluation:  Dietary intake, exercise, and body weight in 2 month(s).

## 2017-09-09 NOTE — Progress Notes (Addendum)
Clearance [name] 08/19/17 cards  Chart.  ekg 07-23-17 epic  cxr 06-21-17 epic  Echo 06/17/17 epic

## 2017-09-09 NOTE — Patient Instructions (Signed)
[name]  09/09/2017   Your procedure is scheduled on: 09-11-17  Report to [facility] Main  Entrance Take [place]  elevators to 3rd floor to  [place] at    130 PM.    Call this number if you have problems the morning of surgery [phone]    Remember: ONLY 1 PERSON MAY GO WITH YOU TO SHORT STAY TO GET  READY MORNING OF [place].  Do not eat food  :After Midnight. YOU MAY HAVE CLEAR LIQUIDS UNTIL 0930 AM THEN NOTHING BY MOUTH     Take these medicines the morning of surgery with A SIP OF WATER: ZOLOFT, PROPANALOL, INHALERS AND BRING                                You may not have any metal on your body including hair pins and              piercings  Do not wear jewelry, make-up, lotions, powders or perfumes, deodorant             Do not wear nail polish.  Do not shave  48 hours prior to surgery.     Do not bring valuables to the hospital. [place].  Contacts, dentures or bridgework may not be worn into surgery.      Patients discharged the day of surgery will not be allowed to drive home.  Name and phone number of your driver:  Special Instructions: N/A              Please read over the following fact sheets you were given: _____________________________________________________________________             [facility] - Preparing for Surgery Before surgery, you can play an important role.  Because skin is not sterile, your skin needs to be as free of germs as possible.  You can reduce the number of germs on your skin by washing with CHG (chlorahexidine gluconate) soap before surgery.  CHG is an antiseptic cleaner which kills germs and bonds with the skin to continue killing germs even after washing. Please DO NOT use if you have an allergy to CHG or antibacterial soaps.  If your skin becomes reddened/irritated stop using the CHG and inform your nurse when you arrive at Short Stay. Do  not shave (including legs and underarms) for at least 48 hours prior to the first CHG shower.  You may shave your face/neck. Please follow these instructions carefully:  1.  Shower with CHG Soap the night before surgery and the  morning of Surgery.  2.  If you choose to wash your hair, wash your hair first as usual with your  normal  shampoo.  3.  After you shampoo, rinse your hair and body thoroughly to remove the  shampoo.                           4.  Use CHG as you would any other liquid soap.  You can apply chg directly  to the skin and wash  Gently with a scrungie or clean washcloth.  5.  Apply the CHG Soap to your body ONLY FROM THE NECK DOWN.   Do not use on face/ open                           Wound or open sores. Avoid contact with eyes, ears mouth and genitals (private parts).                       Wash face,  Genitals (private parts) with your normal soap.             6.  Wash thoroughly, paying special attention to the area where your surgery  will be performed.  7.  Thoroughly rinse your body with warm water from the neck down.  8.  DO NOT shower/wash with your normal soap after using and rinsing off  the CHG Soap.                9.  Pat yourself dry with a clean towel.            10.  Wear clean pajamas.            11.  Place clean sheets on your bed the night of your first shower and do not  sleep with pets. Day of Surgery : Do not apply any lotions/deodorants the morning of surgery.  Please wear clean clothes to the hospital/surgery center.  FAILURE TO FOLLOW THESE INSTRUCTIONS MAY RESULT IN THE CANCELLATION OF YOUR SURGERY PATIENT SIGNATURE_________________________________  NURSE SIGNATURE__________________________________  ________________________________________________________________________    CLEAR LIQUID DIET   Foods Allowed                                                                     Foods Excluded  Coffee and tea, regular and  decaf                             liquids that you cannot  Plain Jell-O in any flavor                                             see through such as: Fruit ices (not with fruit pulp)                                     milk, soups, orange juice  Iced Popsicles                                    All solid food Carbonated beverages, regular and diet                                    Cranberry, grape and apple juices Sports drinks like Gatorade Lightly seasoned clear broth or consume(fat free) Sugar, honey syrup  Sample Menu Breakfast                                Lunch                                     Supper Cranberry juice                    Beef broth                            Chicken broth Jell-O                                     Grape juice                           Apple juice Coffee or tea                        Jell-O                                      Popsicle                                                Coffee or tea                        Coffee or tea  _____________________________________________________________________

## 2017-09-09 NOTE — Patient Instructions (Addendum)
Make sure to get in three meals per day. Try to have balanced meals like the My Plate example (see handout). Try to include more vegetables, fruits, and whole grains at meals.   Continue being mindful of sodium content in foods and try to select foods low in sodium most of the time (see handout). Try to select heart healthy foods (see handout).   Practice Mindful Eating  At meal and snack times, put away electronics (TV, phone, tablet, etc.) and try to eat seated at a table so you can better focus on eating your meal/snack and promote listening to your body's fullness and hunger signals.  If you feel that you are wanting to snack because your are bored or due to emotions and not because you are hungry, try to do a fun activity (read a book, take a walk, talk with a friend, etc.) for at least 20 minutes.    For help with meal planning: [employer]  For assistance with healthy recipes: https://whatscooking.CustomizedRugs.fi  Calorieking.com -for information on nutrition in foods at restaurants   Make physical activity a part of your week. Try to include at least 30 minutes of physical activity 5 days each week or at least 150 minutes per week. Regular physical activity promotes overall health-including helping to reduce risk for heart disease and diabetes, promoting mental health, and helping [place] sleep better.  -Walking can be a great way to get in more activity. Recommend gradually adding in another day of activity to reach at least 30  minutes of activity 5 days per week.

## 2017-09-10 ENCOUNTER — Other Ambulatory Visit: Payer: Self-pay

## 2017-09-10 ENCOUNTER — Encounter (HOSPITAL_COMMUNITY): Payer: Self-pay

## 2017-09-10 ENCOUNTER — Encounter (HOSPITAL_COMMUNITY)
Admission: RE | Admit: 2017-09-10 | Discharge: 2017-09-10 | Disposition: A | Discharge status: Home / Self Care | Payer: Medicare HMO | Source: Ambulatory Visit | Attending: Surgery | Admitting: Surgery

## 2017-09-10 DIAGNOSIS — I110 Hypertensive heart disease with heart failure: Secondary | ICD-10-CM | POA: Insufficient documentation

## 2017-09-10 DIAGNOSIS — Z79899 Other long term (current) drug therapy: Secondary | ICD-10-CM | POA: Diagnosis not present

## 2017-09-10 DIAGNOSIS — J45909 Unspecified asthma, uncomplicated: Secondary | ICD-10-CM | POA: Diagnosis not present

## 2017-09-10 DIAGNOSIS — G4730 Sleep apnea, unspecified: Secondary | ICD-10-CM | POA: Diagnosis not present

## 2017-09-10 DIAGNOSIS — Z87891 Personal history of nicotine dependence: Secondary | ICD-10-CM | POA: Insufficient documentation

## 2017-09-10 DIAGNOSIS — F419 Anxiety disorder, unspecified: Secondary | ICD-10-CM | POA: Diagnosis not present

## 2017-09-10 DIAGNOSIS — Z888 Allergy status to other drugs, medicaments and biological substances status: Secondary | ICD-10-CM | POA: Diagnosis not present

## 2017-09-10 DIAGNOSIS — Z6841 Body Mass Index (BMI) 40.0 and over, adult: Secondary | ICD-10-CM | POA: Insufficient documentation

## 2017-09-10 DIAGNOSIS — K8011 Calculus of gallbladder with chronic cholecystitis with obstruction: Secondary | ICD-10-CM | POA: Insufficient documentation

## 2017-09-10 DIAGNOSIS — Z91018 Allergy to other foods: Secondary | ICD-10-CM | POA: Diagnosis not present

## 2017-09-10 DIAGNOSIS — I509 Heart failure, unspecified: Secondary | ICD-10-CM | POA: Diagnosis not present

## 2017-09-10 DIAGNOSIS — K8010 Calculus of gallbladder with chronic cholecystitis without obstruction: Secondary | ICD-10-CM | POA: Diagnosis present

## 2017-09-10 LAB — CBC
HCT: 39.3 % (ref 36.0–46.0)
Hemoglobin: 13 g/dL (ref 12.0–15.0)
MCH: 29.5 pg (ref 26.0–34.0)
MCHC: 33.1 g/dL (ref 30.0–36.0)
MCV: 89.1 fL (ref 78.0–100.0)
Platelets: 314 10*3/uL (ref 150–400)
RBC: 4.41 MIL/uL (ref 3.87–5.11)
RDW: 15.4 % (ref 11.5–15.5)
WBC: 6.1 10*3/uL (ref 4.0–10.5)

## 2017-09-10 LAB — BASIC METABOLIC PANEL
Anion gap: 7 (ref 5–15)
BUN: 14 mg/dL (ref 6–20)
CO2: 25 mmol/L (ref 22–32)
Calcium: 9.3 mg/dL (ref 8.9–10.3)
Chloride: 108 mmol/L (ref 101–111)
Creatinine, Ser: 0.82 mg/dL (ref 0.44–1.00)
GFR calc Af Amer: >60 mL/min (ref >60)
GFR calc non Af Amer: >60 mL/min (ref >60)
Glucose, Bld: 103 mg/dL — ABNORMAL HIGH (ref 65–99)
Potassium: 3.7 mmol/L (ref 3.5–5.1)
Sodium: 140 mmol/L (ref 135–145)

## 2017-09-10 LAB — HCG, SERUM, QUALITATIVE: Preg, Serum: NEGATIVE

## 2017-09-11 ENCOUNTER — Ambulatory Visit (HOSPITAL_COMMUNITY): Payer: Medicare HMO | Admitting: Anesthesiology

## 2017-09-11 ENCOUNTER — Encounter (HOSPITAL_COMMUNITY): Payer: Self-pay | Admitting: *Deleted

## 2017-09-11 ENCOUNTER — Ambulatory Visit (HOSPITAL_COMMUNITY): Payer: Medicare HMO

## 2017-09-11 ENCOUNTER — Ambulatory Visit (HOSPITAL_COMMUNITY)
Admission: RE | Admit: 2017-09-11 | Discharge: 2017-09-11 | Disposition: A | Discharge status: Home / Self Care | Payer: Medicare HMO | Source: Ambulatory Visit | Attending: Surgery | Admitting: Surgery

## 2017-09-11 ENCOUNTER — Encounter (HOSPITAL_COMMUNITY)
Admission: RE | Disposition: A | Discharge status: Home / Self Care | Payer: Self-pay | Source: Ambulatory Visit | Attending: Surgery

## 2017-09-11 DIAGNOSIS — G4730 Sleep apnea, unspecified: Secondary | ICD-10-CM | POA: Diagnosis not present

## 2017-09-11 DIAGNOSIS — K8010 Calculus of gallbladder with chronic cholecystitis without obstruction: Secondary | ICD-10-CM | POA: Diagnosis not present

## 2017-09-11 DIAGNOSIS — K8011 Calculus of gallbladder with chronic cholecystitis with obstruction: Secondary | ICD-10-CM | POA: Diagnosis not present

## 2017-09-11 DIAGNOSIS — I10 Essential (primary) hypertension: Secondary | ICD-10-CM | POA: Diagnosis not present

## 2017-09-11 DIAGNOSIS — G4733 Obstructive sleep apnea (adult) (pediatric): Secondary | ICD-10-CM | POA: Diagnosis not present

## 2017-09-11 DIAGNOSIS — I509 Heart failure, unspecified: Secondary | ICD-10-CM | POA: Diagnosis not present

## 2017-09-11 DIAGNOSIS — K831 Obstruction of bile duct: Secondary | ICD-10-CM

## 2017-09-11 DIAGNOSIS — Z6841 Body Mass Index (BMI) 40.0 and over, adult: Secondary | ICD-10-CM | POA: Diagnosis not present

## 2017-09-11 DIAGNOSIS — J45909 Unspecified asthma, uncomplicated: Secondary | ICD-10-CM | POA: Diagnosis not present

## 2017-09-11 DIAGNOSIS — Z79899 Other long term (current) drug therapy: Secondary | ICD-10-CM | POA: Diagnosis not present

## 2017-09-11 DIAGNOSIS — Z419 Encounter for procedure for purposes other than remedying health state, unspecified: Secondary | ICD-10-CM

## 2017-09-11 DIAGNOSIS — F419 Anxiety disorder, unspecified: Secondary | ICD-10-CM | POA: Diagnosis not present

## 2017-09-11 DIAGNOSIS — K839 Disease of biliary tract, unspecified: Secondary | ICD-10-CM | POA: Diagnosis not present

## 2017-09-11 DIAGNOSIS — F3130 Bipolar disorder, current episode depressed, mild or moderate severity, unspecified: Secondary | ICD-10-CM | POA: Diagnosis present

## 2017-09-11 DIAGNOSIS — I110 Hypertensive heart disease with heart failure: Secondary | ICD-10-CM | POA: Diagnosis not present

## 2017-09-11 DIAGNOSIS — K5900 Constipation, unspecified: Secondary | ICD-10-CM | POA: Diagnosis not present

## 2017-09-11 HISTORY — DX: Personal history of other diseases of the circulatory system: Z86.79

## 2017-09-11 HISTORY — PX: LAPAROSCOPIC CHOLECYSTECTOMY SINGLE SITE WITH INTRAOPERATIVE CHOLANGIOGRAM: SHX6538

## 2017-09-11 HISTORY — DX: Obstructive sleep apnea (adult) (pediatric): G47.33

## 2017-09-11 SURGERY — LAPAROSCOPIC CHOLECYSTECTOMY SINGLE SITE WITH INTRAOPERATIVE CHOLANGIOGRAM
Anesthesia: General

## 2017-09-11 MED ORDER — BUPIVACAINE-EPINEPHRINE 0.25% -1:200000 IJ SOLN
INTRAMUSCULAR | Status: AC
  Filled 2017-09-11: qty 1

## 2017-09-11 MED ORDER — ALBUTEROL SULFATE HFA 108 (90 BASE) MCG/ACT IN AERS: 2.0000 | INHALATION_SPRAY | Freq: Four times a day (QID) | RESPIRATORY_TRACT | Status: DC | PRN

## 2017-09-11 MED ORDER — KETAMINE HCL 10 MG/ML IJ SOLN
INTRAMUSCULAR | Status: DC | PRN
  Administered 2017-09-11: 30 mg via INTRAVENOUS

## 2017-09-11 MED ORDER — LIDOCAINE 2% (20 MG/ML) 5 ML SYRINGE
INTRAMUSCULAR | Status: DC | PRN
  Administered 2017-09-11: 1.5 mg/kg/h via INTRAVENOUS

## 2017-09-11 MED ORDER — SUCRALFATE 1 G PO TABS: 1.0000 g | ORAL_TABLET | Freq: Three times a day (TID) | ORAL | Status: DC

## 2017-09-11 MED ORDER — CHLORHEXIDINE GLUCONATE CLOTH 2 % EX PADS: 6.0000 | MEDICATED_PAD | Freq: Once | CUTANEOUS | Status: DC

## 2017-09-11 MED ORDER — KETOROLAC TROMETHAMINE 30 MG/ML IJ SOLN
30.0000 mg | Freq: Once | INTRAMUSCULAR | Status: DC | PRN
  Administered 2017-09-11: 30 mg via INTRAVENOUS

## 2017-09-11 MED ORDER — ACETAMINOPHEN 500 MG PO TABS
1000.0000 mg | ORAL_TABLET | ORAL | Status: AC
  Administered 2017-09-11: 1000 mg via ORAL
  Filled 2017-09-11: qty 2

## 2017-09-11 MED ORDER — FENTANYL CITRATE (PF) 100 MCG/2ML IJ SOLN
INTRAMUSCULAR | Status: AC
  Administered 2017-09-11: 25 ug via INTRAVENOUS
  Filled 2017-09-11: qty 4

## 2017-09-11 MED ORDER — OXYCODONE HCL 5 MG PO TABS
5.0000 mg | ORAL_TABLET | ORAL | Status: DC | PRN
  Administered 2017-09-11: 5 mg via ORAL

## 2017-09-11 MED ORDER — CELECOXIB 200 MG PO CAPS
200.0000 mg | ORAL_CAPSULE | ORAL | Status: AC
  Administered 2017-09-11: 200 mg via ORAL
  Filled 2017-09-11: qty 1

## 2017-09-11 MED ORDER — MIDAZOLAM HCL 2 MG/2ML IJ SOLN
INTRAMUSCULAR | Status: AC
  Filled 2017-09-11: qty 2

## 2017-09-11 MED ORDER — OXYCODONE HCL 5 MG PO TABS
ORAL_TABLET | ORAL | Status: AC
  Filled 2017-09-11: qty 1

## 2017-09-11 MED ORDER — IBUPROFEN 600 MG PO TABS: 600.0000 mg | ORAL_TABLET | Freq: Three times a day (TID) | ORAL | Status: DC | PRN

## 2017-09-11 MED ORDER — ROCURONIUM BROMIDE 50 MG/5ML IV SOSY
PREFILLED_SYRINGE | INTRAVENOUS | Status: AC
  Filled 2017-09-11: qty 5

## 2017-09-11 MED ORDER — HYDRALAZINE HCL 20 MG/ML IJ SOLN
INTRAMUSCULAR | Status: AC
  Administered 2017-09-11: 5 mg via INTRAVENOUS
  Filled 2017-09-11: qty 1

## 2017-09-11 MED ORDER — 0.9 % SODIUM CHLORIDE (POUR BTL) OPTIME
TOPICAL | Status: DC | PRN
  Administered 2017-09-11: 1000 mL

## 2017-09-11 MED ORDER — KETAMINE HCL 10 MG/ML IJ SOLN
INTRAMUSCULAR | Status: AC
  Filled 2017-09-11: qty 1

## 2017-09-11 MED ORDER — ONDANSETRON HCL 4 MG/2ML IJ SOLN
INTRAMUSCULAR | Status: DC | PRN
  Administered 2017-09-11: 4 mg via INTRAVENOUS

## 2017-09-11 MED ORDER — LACTATED RINGERS IR SOLN
Status: DC | PRN
  Administered 2017-09-11: 1000 mL

## 2017-09-11 MED ORDER — ARTIFICIAL TEARS OPHTHALMIC OINT
TOPICAL_OINTMENT | OPHTHALMIC | Status: AC
  Filled 2017-09-11: qty 3.5

## 2017-09-11 MED ORDER — MOMETASONE FURO-FORMOTEROL FUM 200-5 MCG/ACT IN AERO: 2.0000 | INHALATION_SPRAY | Freq: Two times a day (BID) | RESPIRATORY_TRACT | Status: DC

## 2017-09-11 MED ORDER — LIDOCAINE 2% (20 MG/ML) 5 ML SYRINGE
INTRAMUSCULAR | Status: DC | PRN
  Administered 2017-09-11: 75 mg via INTRAVENOUS

## 2017-09-11 MED ORDER — SUGAMMADEX SODIUM 200 MG/2ML IV SOLN
INTRAVENOUS | Status: AC
  Filled 2017-09-11: qty 2

## 2017-09-11 MED ORDER — IOPAMIDOL (ISOVUE-300) INJECTION 61%
INTRAVENOUS | Status: DC | PRN
  Administered 2017-09-11: 20 mL

## 2017-09-11 MED ORDER — FENTANYL CITRATE (PF) 250 MCG/5ML IJ SOLN
INTRAMUSCULAR | Status: DC | PRN
  Administered 2017-09-11: 50 ug via INTRAVENOUS
  Administered 2017-09-11: 100 ug via INTRAVENOUS
  Administered 2017-09-11: 50 ug via INTRAVENOUS

## 2017-09-11 MED ORDER — HYDRALAZINE HCL 20 MG/ML IJ SOLN
5.0000 mg | INTRAMUSCULAR | Status: DC | PRN
  Administered 2017-09-11 (×2): 5 mg via INTRAVENOUS

## 2017-09-11 MED ORDER — HYDROMORPHONE HCL 1 MG/ML IJ SOLN
INTRAMUSCULAR | Status: AC
  Filled 2017-09-11: qty 1

## 2017-09-11 MED ORDER — SUCCINYLCHOLINE CHLORIDE 200 MG/10ML IV SOSY
PREFILLED_SYRINGE | INTRAVENOUS | Status: DC | PRN
  Administered 2017-09-11: 120 mg via INTRAVENOUS

## 2017-09-11 MED ORDER — SUGAMMADEX SODIUM 500 MG/5ML IV SOLN
INTRAVENOUS | Status: AC
  Filled 2017-09-11: qty 5

## 2017-09-11 MED ORDER — ROCURONIUM BROMIDE 10 MG/ML (PF) SYRINGE
PREFILLED_SYRINGE | INTRAVENOUS | Status: DC | PRN
  Administered 2017-09-11: 20 mg via INTRAVENOUS
  Administered 2017-09-11: 50 mg via INTRAVENOUS
  Administered 2017-09-11: 10 mg via INTRAVENOUS

## 2017-09-11 MED ORDER — DEXAMETHASONE SODIUM PHOSPHATE 10 MG/ML IJ SOLN
INTRAMUSCULAR | Status: DC | PRN
  Administered 2017-09-11: 10 mg via INTRAVENOUS

## 2017-09-11 MED ORDER — IOPAMIDOL (ISOVUE-300) INJECTION 61%
INTRAVENOUS | Status: AC
  Filled 2017-09-11: qty 50

## 2017-09-11 MED ORDER — MIDAZOLAM HCL 5 MG/5ML IJ SOLN
INTRAMUSCULAR | Status: DC | PRN
  Administered 2017-09-11: 2 mg via INTRAVENOUS

## 2017-09-11 MED ORDER — PROPOFOL 10 MG/ML IV BOLUS
INTRAVENOUS | Status: DC | PRN
  Administered 2017-09-11: 200 mg via INTRAVENOUS

## 2017-09-11 MED ORDER — PROPOFOL 10 MG/ML IV BOLUS
INTRAVENOUS | Status: AC
  Filled 2017-09-11: qty 20

## 2017-09-11 MED ORDER — HYDROCODONE-ACETAMINOPHEN 5-325 MG PO TABS: 1.0000 | ORAL_TABLET | Freq: Four times a day (QID) | ORAL | 0 refills | Status: DC | PRN

## 2017-09-11 MED ORDER — LACTATED RINGERS IV SOLN
INTRAVENOUS | Status: DC
  Administered 2017-09-11: 14:00:00 via INTRAVENOUS

## 2017-09-11 MED ORDER — SERTRALINE HCL 50 MG PO TABS: 50.0000 mg | ORAL_TABLET | Freq: Every day | ORAL | Status: DC

## 2017-09-11 MED ORDER — ALPRAZOLAM 0.25 MG PO TABS: 0.2500 mg | ORAL_TABLET | Freq: Every day | ORAL | Status: DC | PRN

## 2017-09-11 MED ORDER — FENTANYL CITRATE (PF) 250 MCG/5ML IJ SOLN
INTRAMUSCULAR | Status: AC
  Filled 2017-09-11: qty 5

## 2017-09-11 MED ORDER — LIDOCAINE HCL 2 % IJ SOLN
INTRAMUSCULAR | Status: AC
  Filled 2017-09-11: qty 20

## 2017-09-11 MED ORDER — BUPIVACAINE-EPINEPHRINE (PF) 0.25% -1:200000 IJ SOLN
INTRAMUSCULAR | Status: AC
  Filled 2017-09-11: qty 30

## 2017-09-11 MED ORDER — SUGAMMADEX SODIUM 500 MG/5ML IV SOLN
INTRAVENOUS | Status: DC | PRN
  Administered 2017-09-11: 300 mg via INTRAVENOUS

## 2017-09-11 MED ORDER — ONDANSETRON HCL 4 MG/2ML IJ SOLN: 4.0000 mg | Freq: Once | INTRAMUSCULAR | Status: DC | PRN

## 2017-09-11 MED ORDER — BUPIVACAINE-EPINEPHRINE 0.25% -1:200000 IJ SOLN
INTRAMUSCULAR | Status: DC | PRN
  Administered 2017-09-11: 60 mL

## 2017-09-11 MED ORDER — LIDOCAINE 2% (20 MG/ML) 5 ML SYRINGE
INTRAMUSCULAR | Status: AC
  Filled 2017-09-11: qty 5

## 2017-09-11 MED ORDER — SUCCINYLCHOLINE CHLORIDE 200 MG/10ML IV SOSY
PREFILLED_SYRINGE | INTRAVENOUS | Status: AC
  Filled 2017-09-11: qty 10

## 2017-09-11 MED ORDER — PROPRANOLOL HCL 20 MG PO TABS: 20.0000 mg | ORAL_TABLET | Freq: Two times a day (BID) | ORAL | Status: DC

## 2017-09-11 MED ORDER — GABAPENTIN 300 MG PO CAPS
300.0000 mg | ORAL_CAPSULE | ORAL | Status: AC
  Administered 2017-09-11: 300 mg via ORAL
  Filled 2017-09-11: qty 1

## 2017-09-11 MED ORDER — ONDANSETRON HCL 4 MG/2ML IJ SOLN
INTRAMUSCULAR | Status: AC
  Filled 2017-09-11: qty 2

## 2017-09-11 MED ORDER — KETOROLAC TROMETHAMINE 30 MG/ML IJ SOLN
INTRAMUSCULAR | Status: AC
  Filled 2017-09-11: qty 1

## 2017-09-11 MED ORDER — FENTANYL CITRATE (PF) 100 MCG/2ML IJ SOLN
25.0000 ug | INTRAMUSCULAR | Status: DC | PRN
  Administered 2017-09-11 (×2): 25 ug via INTRAVENOUS

## 2017-09-11 SURGICAL SUPPLY — 40 items
APPLIER CLIP 5 13 M/L LIGAMAX5 (MISCELLANEOUS) ×2
APR CLP MED LRG 5 ANG JAW (MISCELLANEOUS) ×1
BAG SPEC RTRVL LRG 6X4 10 (ENDOMECHANICALS)
CABLE HIGH FREQUENCY MONO STRZ (ELECTRODE) ×2 IMPLANT
CHLORAPREP W/TINT 26ML (MISCELLANEOUS) ×2 IMPLANT
CLIP APPLIE 5 13 M/L LIGAMAX5 (MISCELLANEOUS) ×1 IMPLANT
COVER MAYO STAND STRL (DRAPES) ×2 IMPLANT
COVER SURGICAL LIGHT HANDLE (MISCELLANEOUS) ×2 IMPLANT
DECANTER SPIKE VIAL GLASS SM (MISCELLANEOUS) ×2 IMPLANT
DRAIN CHANNEL 19F RND (DRAIN) IMPLANT
DRAPE C-ARM 42X120 X-RAY (DRAPES) ×2 IMPLANT
DRAPE WARM FLUID 44X44 (DRAPE) ×2 IMPLANT
DRSG TEGADERM 4X4.75 (GAUZE/BANDAGES/DRESSINGS) ×2 IMPLANT
ELECT REM PT RETURN 15FT ADLT (MISCELLANEOUS) ×2 IMPLANT
ENDOLOOP SUT PDS II  0 18 (SUTURE) ×2
ENDOLOOP SUT PDS II 0 18 (SUTURE) IMPLANT
EVACUATOR SILICONE 100CC (DRAIN) IMPLANT
GAUZE SPONGE 2X2 8PLY STRL LF (GAUZE/BANDAGES/DRESSINGS) ×1 IMPLANT
GLOVE ECLIPSE 8.0 STRL XLNG CF (GLOVE) ×2 IMPLANT
GLOVE INDICATOR 8.0 STRL GRN (GLOVE) ×2 IMPLANT
GOWN STRL REUS W/TWL XL LVL3 (GOWN DISPOSABLE) ×4 IMPLANT
IRRIG SUCT STRYKERFLOW 2 WTIP (MISCELLANEOUS) ×2
IRRIGATION SUCT STRKRFLW 2 WTP (MISCELLANEOUS) ×1 IMPLANT
KIT BASIN OR (CUSTOM PROCEDURE TRAY) ×2 IMPLANT
PAD POSITIONING PINK XL (MISCELLANEOUS) ×2 IMPLANT
POSITIONER SURGICAL ARM (MISCELLANEOUS) IMPLANT
POUCH SPECIMEN RETRIEVAL 10MM (ENDOMECHANICALS) IMPLANT
SCISSORS LAP 5X35 DISP (ENDOMECHANICALS) ×2 IMPLANT
SET CHOLANGIOGRAPH MIX (MISCELLANEOUS) ×2 IMPLANT
SHEARS HARMONIC ACE PLUS 36CM (ENDOMECHANICALS) ×2 IMPLANT
SPONGE GAUZE 2X2 STER 10/PKG (GAUZE/BANDAGES/DRESSINGS) ×1
SUT MNCRL AB 4-0 PS2 18 (SUTURE) ×2 IMPLANT
SUT PDS AB 1 CT1 27 (SUTURE) ×4 IMPLANT
SYR 20CC LL (SYRINGE) ×2 IMPLANT
TOWEL OR 17X26 10 PK STRL BLUE (TOWEL DISPOSABLE) ×2 IMPLANT
TOWEL OR NON WOVEN STRL DISP B (DISPOSABLE) ×2 IMPLANT
TRAY LAPAROSCOPIC (CUSTOM PROCEDURE TRAY) ×2 IMPLANT
TROCAR BLADELESS OPT 5 100 (ENDOMECHANICALS) ×2 IMPLANT
TROCAR BLADELESS OPT 5 150 (ENDOMECHANICALS) ×2 IMPLANT
TUBING INSUF HEATED (TUBING) ×2 IMPLANT
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
## 2017-09-11 NOTE — Anesthesia Procedure Notes (Signed)
Procedure Name: Intubation Date/Time: 09/11/2017 3:22 PM Performed by: Lollie Sails, CRNA Pre-anesthesia Checklist: Patient identified, Emergency Drugs available, Suction available, Patient being monitored and Timeout performed Patient Re-evaluated:Patient Re-evaluated prior to induction Oxygen Delivery Method: Circle system utilized Preoxygenation: Pre-oxygenation with 100% oxygen Induction Type: IV induction Ventilation: Mask ventilation without difficulty Laryngoscope Size: Miller and 3 Grade View: Grade I Tube type: Oral Tube size: 7.5 mm Number of attempts: 1 Airway Equipment and Method: Stylet Placement Confirmation: ETT inserted through vocal cords under direct vision,  positive ETCO2 and breath sounds checked- equal and bilateral Secured at: 23 cm Tube secured with: Tape Dental Injury: Teeth and Oropharynx as per pre-operative assessment

## 2017-09-11 NOTE — Progress Notes (Signed)
Notified Dr. Smith Robert patients blood pressure is elevated at 158/102 and patient is unable to urinate. Per Dr. Smith Robert, will notify the MD on call. Paged Dr. Hassell Done, waiting for response.

## 2017-09-11 NOTE — Anesthesia Postprocedure Evaluation (Signed)
Anesthesia Post Note  Patient: [name]  Procedure(s) Performed: LAPAROSCOPIC CHOLECYSTECTOMY SINGLE SITE WITH INTRAOPERATIVE CHOLANGIOGRAM (N/A )     Patient location during evaluation: PACU Anesthesia Type: General Level of consciousness: awake and alert Pain management: pain level controlled Vital Signs Assessment: post-procedure vital signs reviewed and stable Respiratory status: spontaneous breathing, nonlabored ventilation, respiratory function stable and patient connected to nasal cannula oxygen Cardiovascular status: blood pressure returned to baseline and stable Postop Assessment: no apparent nausea or vomiting Anesthetic complications: no    Last Vitals:  Vitals:   09/11/17 1822 09/11/17 1826  BP: (!) 157/97 (!) 139/98  Pulse: 68 76  Resp: 12 18  Temp: 37.2 C   SpO2: 98% 100%                 Effie Berkshire

## 2017-09-11 NOTE — Transfer of Care (Signed)
Immediate Anesthesia Transfer of Care Note  Patient: [name]  Procedure(s) Performed: LAPAROSCOPIC CHOLECYSTECTOMY SINGLE SITE WITH INTRAOPERATIVE CHOLANGIOGRAM (N/A )  Patient Location: PACU  Anesthesia Type:General  Level of Consciousness: awake, drowsy and responds to stimulation  Airway & Oxygen Therapy: Patient Spontanous Breathing and Patient connected to face mask oxygen  Post-op Assessment: Report given to RN and Post -op Vital signs reviewed and stable  Post vital signs: Reviewed and stable  Last Vitals:  Vitals:   09/11/17 1321 09/11/17 1704  BP: 137/88 (!) 145/106  Pulse: 71 64  Resp: 16 12  Temp: 36.9 C 37 C  SpO2: 98% 100%    Last Pain:  Vitals:   09/11/17 1352  TempSrc:   PainSc: 3       Patients Stated Pain Goal: 4 (81/18/86 [id number])  Complications: No apparent anesthesia complications

## 2017-09-11 NOTE — Interval H&P Note (Signed)
History and Physical Interval Note:  09/11/2017 2:32 PM  [name]  has presented today for surgery, with the diagnosis of SYMPTOMATIC BILIARY COLIC, PROBABLE CHRONIC CHOLECYSTITIS  The various methods of treatment have been discussed with the patient and family. After consideration of risks, benefits and other options for treatment, the patient has consented to  Procedure(s): [place] CHOLANGIOGRAM (N/A) as a surgical intervention .  The patient's history has been reviewed, patient examined, no change in status, stable for surgery.  I have reviewed the patient's chart and labs.  Questions were answered to the patient's satisfaction.     Adin Hector

## 2017-09-11 NOTE — Discharge Instructions (Signed)
General Anesthesia, Adult, Care After These instructions provide you with information about caring for yourself after your procedure. Your health care provider may also give you more specific instructions. Your treatment has been planned according to current medical practices, but problems sometimes occur. Call your health care provider if you have any problems or questions after your procedure. What can I expect after the procedure? After the procedure, it is common to have: Vomiting. A sore throat. Mental slowness.  It is common to feel: Nauseous. Cold or shivery. Sleepy. Tired. Sore or achy, even in parts of your body where you did not have surgery.  Follow these instructions at home: For at least 24 hours after the procedure: Do not: Participate in activities where you could fall or become injured. Drive. Use heavy machinery. Drink alcohol. Take sleeping pills or medicines that cause drowsiness. Make important decisions or sign legal documents. Take care of children on your own. Rest. Eating and drinking If you vomit, drink water, juice, or soup when you can drink without vomiting. Drink enough fluid to keep your urine clear or pale yellow. Make sure you have little or no nausea before eating solid foods. Follow the diet recommended by your health care provider. General instructions Have a responsible adult stay with you until you are awake and alert. Return to your normal activities as told by your health care provider. Ask your health care provider what activities are safe for you. Take over-the-counter and prescription medicines only as told by your health care provider. If you smoke, do not smoke without supervision. Keep all follow-up visits as told by your health care provider. This is important. Contact a health care provider if: You continue to have nausea or vomiting at home, and medicines are not helpful. You cannot drink fluids or start eating again. You cannot  urinate after 8-12 hours. You develop a skin rash. You have fever. You have increasing redness at the site of your procedure. Get help right away if: You have difficulty breathing. You have chest pain. You have unexpected bleeding. You feel that you are having a life-threatening or urgent problem. This information is not intended to replace advice given to you by your health care provider. Make sure you discuss any questions you have with your health care provider. Document Released: 12/31/2000 Document Revised: 02/27/2016 Document Reviewed: 09/08/2015 Elsevier Interactive Patient Education  2018 [place]: POST OP INSTRUCTIONS  ######################################################################  EAT Gradually transition to a high fiber diet with a fiber supplement over the next few weeks after discharge.  Start with a pureed / full liquid diet (see below)  WALK Walk an hour a day.  Control your pain to do that.    CONTROL PAIN Control pain so that you can walk, sleep, tolerate sneezing/coughing, go up/down stairs.  HAVE A BOWEL MOVEMENT DAILY Keep your bowels regular to avoid problems.  OK to try a laxative to override constipation.  OK to use an antidairrheal to slow down diarrhea.  Call if not better after 2 tries  CALL IF YOU HAVE PROBLEMS/CONCERNS Call if you are still struggling despite following these instructions. Call if you have concerns not answered by these instructions  ######################################################################    1. DIET: Follow a light bland diet the first 24 hours after arrival home, such as soup, liquids, crackers, etc.  Be sure to include lots of fluids daily.  Avoid fast food or heavy meals as your are more likely to get nauseated.  Eat a low fat  the next few days after surgery.   2. Take your usually prescribed home medications unless otherwise directed. 3. PAIN CONTROL: a. Pain is best controlled by  a usual combination of three different methods TOGETHER: i. Ice/Heat ii. Over the counter pain medication iii. Prescription pain medication b. Most patients will experience some swelling and bruising around the incisions.  Ice packs or heating pads (30-60 minutes up to 6 times a day) will help. Use ice for the first few days to help decrease swelling and bruising, then switch to heat to help relax tight/sore spots and speed recovery.  Some people prefer to use ice alone, heat alone, alternating between ice & heat.  Experiment to what works for you.  Swelling and bruising can take several weeks to resolve.   c. It is helpful to take an over-the-counter pain medication regularly for the first few weeks.  Choose one of the following that works best for you: i. Naproxen (Aleve, etc)  Two 220mg  tabs twice a day ii. Ibuprofen (Advil, etc) Three 200mg  tabs four times a day (every meal & bedtime) iii. Acetaminophen (Tylenol, etc) 500-650mg  four times a day (every meal & bedtime) d. A  prescription for pain medication (such as oxycodone, hydrocodone, etc) should be given to you upon discharge.  Take your pain medication as prescribed.  i. If you are having problems/concerns with the prescription medicine (does not control pain, nausea, vomiting, rash, itching, etc), please call us [phone] to see if we need to switch you to a different pain medicine that will work better for you and/or control your side effect better. ii. If you need a refill on your pain medication, please contact your pharmacy.  They will contact our office to request authorization. Prescriptions will not be filled after 5 pm or on week-ends. 4. Avoid getting constipated.  Between the surgery and the pain medications, it is common to experience some constipation.  Increasing fluid intake and taking a fiber supplement (such as Metamucil, Citrucel, FiberCon, MiraLax, etc) 1-2 times a day regularly will usually help prevent this problem from  occurring.  A mild laxative (prune juice, Milk of Magnesia, MiraLax, etc) should be taken according to package directions if there are no bowel movements after 48 hours.   5. Watch out for diarrhea.  If you have many loose bowel movements, simplify your diet to bland foods & liquids for a few days.  Stop any stool softeners and decrease your fiber supplement.  Switching to mild anti-diarrheal medications (Kayopectate, Pepto Bismol) can help.  If this worsens or does not improve, please call us. 6. Wash / shower every day.  You may shower over the dressings as they are waterproof.  Continue to shower over incision(s) after the dressing is off. 7. Remove your waterproof bandages 5 days after surgery.  You may leave the incision open to air.  You may replace a dressing/Band-Aid to cover the incision for comfort if you wish.  8. ACTIVITIES as tolerated:   a. You may resume regular (light) daily activities beginning the next day--such as daily self-care, walking, climbing stairs--gradually increasing activities as tolerated.  If you can walk 30 minutes without difficulty, it is safe to try more intense activity such as jogging, treadmill, bicycling, low-impact aerobics, swimming, etc. b. Save the most intensive and strenuous activity for last such as sit-ups, heavy lifting, contact sports, etc  Refrain from any heavy lifting or straining until you are off narcotics for pain control.   c. DO  NOT PUSH THROUGH PAIN.  Let pain be your guide: If it hurts to do something, don't do it.  Pain is your body warning you to avoid that activity for another week until the pain goes down. d. You may drive when you are no longer taking prescription pain medication, you can comfortably wear a seatbelt, and you can safely maneuver your car and apply brakes. e. Dennis Bast may have sexual intercourse when it is comfortable.  9. FOLLOW UP in our office a. Please call [facility] at [phone] to set up an appointment to see your surgeon in  the office for a follow-up appointment approximately 2-3 weeks after your surgery. b. Make sure that you call for this appointment the day you arrive home to insure a convenient appointment time. 10. IF YOU HAVE DISABILITY OR FAMILY LEAVE FORMS, BRING THEM TO THE OFFICE FOR PROCESSING.  DO NOT GIVE THEM TO YOUR DOCTOR.   WHEN TO CALL us [phone]: 1. Poor pain control 2. Reactions / problems with new medications (rash/itching, nausea, etc)  3. Fever over 101.5 F (38.5 C) 4. Inability to urinate 5. Nausea and/or vomiting 6. Worsening swelling or bruising 7. Continued bleeding from incision. 8. Increased pain, redness, or drainage from the incision   The clinic staff is available to answer your questions during regular business hours (8:30am-5pm).  Please dont hesitate to call and ask to speak to one of our nurses for clinical concerns.   If you have a medical emergency, go to the nearest emergency room or call 911.  A surgeon from [facility] is always on call at the [facility], [place], [place], [place], [place]  [address] ? MAIN: [phone] ? TOLL FREE: [phone] ?  FAX [phone] [email or web address]   Cholecystitis Cholecystitis is inflammation of the gallbladder. It is often called a gallbladder attack. The gallbladder is a pear-shaped organ that lies beneath the liver on the right side of the body. The gallbladder stores bile, which is a fluid that helps the body to digest fats. If bile builds up in your gallbladder, your gallbladder becomes inflamed. This condition may occur suddenly (be acute). Repeat episodes of acute cholecystitis or prolonged episodes may lead to a long-term (chronic) condition. Cholecystitis is serious and it requires treatment. What are the causes? The most common cause of this condition is gallstones. Gallstones can block the tube (duct) that carries bile out of your  gallbladder. This causes bile to build up. Other causes of this condition include:  Damage to the gallbladder due to a decrease in blood flow.  Infections in the bile ducts.  Scars or kinks in the bile ducts.  Tumors in the liver, pancreas, or gallbladder.  What increases the risk? This condition is more likely to develop in:  People who have sickle cell disease.  People who take birth control pills or use estrogen.  People who have alcoholic liver disease.  People who have liver cirrhosis.  People who have their nutrition delivered through a vein (parenteral nutrition).  People who do not eat or drink (do fasting) for a long period of time.  People who are obese.  People who have rapid weight loss.  People who are pregnant.  People who have increased triglyceride levels.  People who have pancreatitis.  What are the signs or symptoms? Symptoms of this condition include:  Abdominal pain, especially in the upper right area of the abdomen.  Abdominal tenderness or  bloating.  Nausea.  Vomiting.  Fever.  Chills.  Yellowing of the skin and the whites of the eyes (jaundice).  How is this diagnosed? This condition is diagnosed with a medical history and physical exam. You may also have other tests, including:  Imaging tests, such as: ? An ultrasound of the gallbladder. ? A CT scan of the abdomen. ? A gallbladder nuclear scan (HIDA scan). This scan allows your health care provider to see the bile moving from your liver to your gallbladder and to your small intestine. ? MRI.  Blood tests, such as: ? A complete blood count, because the white blood cell count may be higher than normal. ? Liver function tests, because some levels may be higher than normal with certain types of gallstones.  How is this treated? Treatment may include:  Fasting for a certain amount of time.  IV fluids.  Medicine to treat pain or vomiting.  Antibiotic medicine.  Surgery to  remove your gallbladder (cholecystectomy). This may happen immediately or at a later time.  Follow these instructions at home: Home care will depend on your treatment. In general:  Take over-the-counter and prescription medicines only as told by your health care provider.  If you were prescribed an antibiotic medicine, take it as told by your health care provider. Do not stop taking the antibiotic even if you start to feel better.  Follow instructions from your health care provider about what to eat or drink. When you are allowed to eat, avoid eating or drinking anything that triggers your symptoms.  Keep all follow-up visits as told by your health care provider. This is important.  Contact a health care provider if:  Your pain is not controlled with medicine.  You have a fever. Get help right away if:  Your pain moves to another part of your abdomen or to your back.  You continue to have symptoms or you develop new symptoms even with treatment. This information is not intended to replace advice given to you by your health care provider. Make sure you discuss any questions you have with your health care provider. Document Released: 09/24/2005 Document Revised: 02/02/2016 Document Reviewed: 01/05/2015 Elsevier Interactive Patient Education  2017 [employer].

## 2017-09-11 NOTE — Op Note (Signed)
09/11/2017  PATIENT:  [name]  31 y.o. female  Patient Care Team: [name], MD as PCP - General (Internal Medicine) Luretha Rued, RN as [place] Management Elan Mcelvain, Remo Lipps, MD as Consulting Physician (General Surgery) Carol Ada, MD as Consulting Physician (Gastroenterology) Dorothy Spark, MD as Consulting Physician (Cardiology) Meisinger, Sherren Mocha, MD as Consulting Physician (Obstetrics and Gynecology)  PRE-OPERATIVE DIAGNOSIS:    Chronic Calculus cholecystitis  POST-OPERATIVE DIAGNOSIS:   Chronic [place]culus cholecystitis Distal CBD obstruction, possible choledocolithiasis Liver: normal  PROCEDURE:  SINGLE SITE Laparoscopic cholecystectomy with intraoperative cholangiogram  SURGEON:  Adin Hector, MD, FACS.  ASSISTANT: OR Staff   ANESTHESIA:    General with endotracheal intubation Local anesthetic as a field block  EBL:  (See Anesthesia Intraoperative Record) Total I/O In: -  Out: 15 [Blood:15]  Delay start of Pharmacological VTE agent (>24hrs) due to surgical blood loss or risk of bleeding:  no  DRAINS: None   SPECIMEN: Gallbladder    DISPOSITION OF SPECIMEN:  PATHOLOGY  COUNTS:  YES  PLAN OF CARE: Discharge to home after PACU  PATIENT DISPOSITION:  PACU - hemodynamically stable.  INDICATION: Morbid obese but pleasant female with episodes of biliary colic and common bile duct dilatation.  Gastritis evaluation with EUS showed no definite choledocholithiasis.  Surgical consultation requested.  Felt she would benefit from cholecystectomy.  The anatomy & physiology of hepatobiliary & pancreatic function was discussed.  The pathophysiology of gallbladder dysfunction was discussed.  Natural history risks without surgery was discussed.   I feel the risks of no intervention will lead to serious problems that outweigh the operative risks; therefore, I recommended cholecystectomy to remove the pathology.  I explained  laparoscopic techniques with possible need for an open approach.  Probable cholangiogram to evaluate the bilary tract was explained as well.    Risks such as bleeding, infection, abscess, leak, injury to other organs, need for further treatment, heart attack, death, and other risks were discussed.  I noted a good likelihood this will help address the problem.  Possibility that this will not correct all abdominal symptoms was explained.  Goals of post-operative recovery were discussed as well.  We will work to minimize complications.  An educational handout further explaining the pathology and treatment options was given as well.  Questions were answered.  The patient expresses understanding & wishes to proceed with surgery.  OR FINDINGS: Enlarged large boggy gallbladder.  Some chronic changes consistent with chronic cholecystitis.  IOC shows dilated intrahepatic biliary system with tapering at distant common bile duct.  Possible mobile intraluminal swirling/stones seen distally.  No contrast into the duodenum despite two IOC runs.  Suspicious for possible distal common bile duct stone.  Liver: normal  DESCRIPTION:   The patient was identified & brought in the operating room. The patient was positioned supine with arms tucked. SCDs were active during the entire case. The patient underwent general anesthesia without any difficulty.  The abdomen was prepped and draped in a sterile fashion. A Surgical Timeout confirmed our plan.  I made a transverse curvilinear incision through the superior umbilical fold.  I placed a 78mm long port through the supraumbilical fascia using a modified Hassan cutdown technique with umbilical stalk fascial countertraction. I began carbon dioxide insufflation.  No change in end tidal CO2 measurement.   Camera inspection revealed no injury. There were no adhesions to the anterior abdominal wall supraumbilically.  I proceeded to continue with single site technique. I placed a #5  port in left upper aspect of the wound. I placed a 5 mm atraumatic grasper in the right inferior aspect of the wound.  I turned attention to the right upper quadrant.  The patient had very enlarged but fluffy liver.  No strong evidence of fatty change in the liver.  The gallbladder was quite enlarged and stretched out.  Bulky.  Rather distended.  However I was able to grasp the gallbladder fundus & elevate cephalad. I freed adhesions to the ventral surface of the gallbladder off carefully.  Mostly at the level of the infundibulum.  I freed the peritoneal coverings between the gallbladder and the liver on the posteriolateral and anteriomedial walls. I alternated between Harmonic & blunt Maryland dissection to help get a good critical view of the cystic artery and cystic duct. I did further dissection to free 5 centimeters of the gallbladder off the liver bed to get a good critical view of the infundibulum and cystic duct. I dissected out the cystic artery; and, after getting a good 360 view, ligated the anterior & posterior branches of the cystic artery close on the infundibulum using the Harmonic ultrasonic dissection.  I skeletonized the cystic duct.  I placed a clip on the infundibulum. I did a partial cystic duct-otomy and ensured patency. I placed a 5 [place] cholangiocatheter through a puncture site at the right subcostal ridge of the abdominal wall and directed it into the cystic duct.  We ran a cholangiogram with dilute radio-opaque contrast and continuous fluoroscopy. Contrast flowed from a helical side branch consistent with cystic duct cannulization. Contrast flowed up the common hepatic duct into the right and left intrahepatic chains out to secondary radicals. Contrast flowed down the common bile duct easily but stopped at the distal common bile duct with a smooth taper.  There was some intraluminal swirling and possible small stones.  The overall biliary system was moderately dilated.  I flushed  with another 40 mL of saline and attempted repeat cholangiogram.  Again would not go into the duodenum.  Concerning for distal common bile duct obstruction.  Suspicious for persistent choledocholithiasis.  I removed the cholangiocatheter.  Did get some brisk bleeding from a cystic duct arterial branch.  I freed the gallbladder from alteration of its attachments to the liver in a dome down fashion.  I ligated the cystic duct with 0 PDS Endoloop and a few clips.  Careful irrigation inspection to confirm that I had good hemostasis.  Cystic artery was ligated with 2 clips.  Cystic duct ligated with Endoloop and 4 clips.  I completed cystic duct transection.  Inspected and irrigated to prove no bleeding or leak of bile.  Hemostasis good. I ensured hemostasis on the gallbladder fossa of the liver and elsewhere. I inspected the rest of the abdomen & detected no injury nor bleeding elsewhere.  I removed the gallbladder out the supraumbilical fascia. I closed the fascia transversely using #1 PDS interrupted stitches. I closed the skin using 4-0 monocryl stitch.  Sterile dressing was applied. The patient was extubated & arrived in the PACU in stable condition..  I had discussed postoperative care with the patient in the holding area. I discussed operative findings, updated the patient's status, discussed probable steps to recovery, and gave postoperative recommendations to the patient's spouse.  Recommendations were made.  Questions were answered.  He expressed understanding & appreciation.  I paged Dr. Benson Norway & covering [place] service but no answer yet.  I will again attempt to reach out to gastroenterology  to see if patient would benefit from follow-up blood work, MRCP, or possible ERCP.  Adin Hector, M.D., F.A.C.S. Gastrointestinal and Minimally Invasive Surgery [facility] [address] [place], [place] [address] [phone] Main / Paging  09/11/2017 4:57 PM

## 2017-09-11 NOTE — Anesthesia Preprocedure Evaluation (Addendum)
Anesthesia Evaluation  Patient identified by MRN, date of birth, ID band Patient awake    Reviewed: Allergy & Precautions, NPO status , Patient's Chart, lab work & pertinent test results  Airway Mallampati: II  TM Distance: >3 FB Neck ROM: Full    Dental  (+) Dental Advisory Given   Pulmonary asthma , sleep apnea , former smoker,    breath sounds clear to auscultation       Cardiovascular hypertension, Pt. on medications  Rhythm:Regular Rate:Normal     Neuro/Psych negative neurological ROS     GI/Hepatic negative GI ROS, Neg liver ROS,   Endo/Other  Morbid obesity  Renal/GU negative Renal ROS     Musculoskeletal   Abdominal   Peds  Hematology negative hematology ROS (+)   Anesthesia Other Findings   Reproductive/Obstetrics                            Lab Results  Component Value Date   WBC 6.1 09/10/2017   HGB 13.0 09/10/2017   HCT 39.3 09/10/2017   MCV 89.1 09/10/2017   PLT 314 09/10/2017   Lab Results  Component Value Date   CREATININE 0.82 09/10/2017   BUN 14 09/10/2017   NA 140 09/10/2017   K 3.7 09/10/2017   CL 108 09/10/2017   CO2 25 09/10/2017    Anesthesia Physical Anesthesia Plan  ASA: III  Anesthesia Plan: General   Post-op Pain Management:    Induction: Intravenous  PONV Risk Score and Plan: 4 or greater and Ondansetron, Dexamethasone, Midazolam, Scopolamine patch - Pre-op and Treatment may vary due to age or medical condition  Airway Management Planned: Oral ETT  Additional Equipment:   Intra-op Plan:   Post-operative Plan: Extubation in OR  Informed Consent: I have reviewed the patients History and Physical, chart, labs and discussed the procedure including the risks, benefits and alternatives for the proposed anesthesia with the patient or authorized representative who has indicated his/her understanding and acceptance.   Dental advisory  given  Plan Discussed with: CRNA  Anesthesia Plan Comments:         Anesthesia Quick Evaluation

## 2017-09-12 ENCOUNTER — Encounter (HOSPITAL_COMMUNITY): Payer: Self-pay | Admitting: Surgery

## 2017-09-12 ENCOUNTER — Other Ambulatory Visit: Payer: Self-pay | Admitting: Gastroenterology

## 2017-09-12 ENCOUNTER — Other Ambulatory Visit: Payer: Self-pay

## 2017-09-12 NOTE — Patient Outreach (Addendum)
[facility]) Care Management  09/12/2017  [name] [birth year] [id number]   31 year old post laparoscopic cholecystectomy on 09/11/17. RNCM called to complete transition of care. No answer. HIPPA compliant message left.  Plan: continue to attempt to reach.  Thea Silversmith, RN, MSN, [place] Coordinator Cell: [phone]

## 2017-09-12 NOTE — Progress Notes (Signed)
Not really sure about this patient.  Looks like she just recently had an EUS by Dr. Benson Norway.  Dr. Henrene Pastor saw her back in 2015.

## 2017-09-13 ENCOUNTER — Telehealth: Payer: Self-pay | Admitting: Physician Assistant

## 2017-09-13 ENCOUNTER — Other Ambulatory Visit: Payer: Self-pay

## 2017-09-13 NOTE — Telephone Encounter (Signed)
Patient just recently had gallbladder surgery.  Postsurgery, she did take a dose of Lasix 40 mg.  She denies any lower extremity edema, orthopnea or PND.  Her home weight is 276 pounds, however this is after food.  Her weight prior to the surgery was 273 pounds which is close to her dry weight.  She will continue to monitor and the informed cardiology if she has weight gain of 3 pounds overnight or 5 pounds in single week.  Hilbert Corrigan PA Pager: [phone]

## 2017-09-13 NOTE — Patient Outreach (Addendum)
[facility]) Care Management  09/13/2017  [name] 04-27-1986 [id number]   Subjective: reports pain at "5" at this time.  Objective: none  Assessment: 31 year old post laparoscopic cholecystectomy on 09/11/17. Transition of care call completed. Discharge instructions reviewed.  Client reports she is scheduled for another procedure on 12/11 for retrieval of a gallstone, unclear of the location.   Plan: Home visit next week.  Thea Silversmith, RN, MSN, [place] Coordinator Cell: [phone]

## 2017-09-17 ENCOUNTER — Encounter (HOSPITAL_COMMUNITY): Payer: Self-pay | Admitting: *Deleted

## 2017-09-17 ENCOUNTER — Ambulatory Visit (HOSPITAL_COMMUNITY): Payer: Medicare HMO | Admitting: Anesthesiology

## 2017-09-17 ENCOUNTER — Other Ambulatory Visit: Payer: Self-pay

## 2017-09-17 ENCOUNTER — Ambulatory Visit (HOSPITAL_COMMUNITY)
Admission: RE | Admit: 2017-09-17 | Discharge: 2017-09-17 | Disposition: A | Discharge status: Home / Self Care | Payer: Medicare HMO | Source: Ambulatory Visit | Attending: Gastroenterology | Admitting: Gastroenterology

## 2017-09-17 ENCOUNTER — Encounter (HOSPITAL_COMMUNITY)
Admission: RE | Disposition: A | Discharge status: Home / Self Care | Payer: Self-pay | Source: Ambulatory Visit | Attending: Gastroenterology

## 2017-09-17 DIAGNOSIS — K8011 Calculus of gallbladder with chronic cholecystitis with obstruction: Secondary | ICD-10-CM | POA: Diagnosis not present

## 2017-09-17 DIAGNOSIS — R935 Abnormal findings on diagnostic imaging of other abdominal regions, including retroperitoneum: Secondary | ICD-10-CM | POA: Insufficient documentation

## 2017-09-17 DIAGNOSIS — R1013 Epigastric pain: Secondary | ICD-10-CM | POA: Diagnosis not present

## 2017-09-17 DIAGNOSIS — I509 Heart failure, unspecified: Secondary | ICD-10-CM | POA: Insufficient documentation

## 2017-09-17 DIAGNOSIS — Z79899 Other long term (current) drug therapy: Secondary | ICD-10-CM | POA: Diagnosis not present

## 2017-09-17 DIAGNOSIS — R932 Abnormal findings on diagnostic imaging of liver and biliary tract: Secondary | ICD-10-CM | POA: Diagnosis not present

## 2017-09-17 DIAGNOSIS — G4733 Obstructive sleep apnea (adult) (pediatric): Secondary | ICD-10-CM | POA: Insufficient documentation

## 2017-09-17 DIAGNOSIS — Z87891 Personal history of nicotine dependence: Secondary | ICD-10-CM | POA: Diagnosis not present

## 2017-09-17 DIAGNOSIS — I110 Hypertensive heart disease with heart failure: Secondary | ICD-10-CM | POA: Insufficient documentation

## 2017-09-17 DIAGNOSIS — J45909 Unspecified asthma, uncomplicated: Secondary | ICD-10-CM | POA: Insufficient documentation

## 2017-09-17 DIAGNOSIS — F419 Anxiety disorder, unspecified: Secondary | ICD-10-CM | POA: Diagnosis not present

## 2017-09-17 DIAGNOSIS — I10 Essential (primary) hypertension: Secondary | ICD-10-CM | POA: Diagnosis not present

## 2017-09-17 DIAGNOSIS — F319 Bipolar disorder, unspecified: Secondary | ICD-10-CM | POA: Diagnosis not present

## 2017-09-17 HISTORY — PX: EUS: SHX5427

## 2017-09-17 SURGERY — UPPER ENDOSCOPIC ULTRASOUND (EUS) LINEAR
Anesthesia: General

## 2017-09-17 MED ORDER — FENTANYL CITRATE (PF) 250 MCG/5ML IJ SOLN
INTRAMUSCULAR | Status: AC
  Filled 2017-09-17: qty 5

## 2017-09-17 MED ORDER — LACTATED RINGERS IV SOLN
INTRAVENOUS | Status: DC
  Administered 2017-09-17: 12:00:00 via INTRAVENOUS

## 2017-09-17 MED ORDER — GLUCAGON HCL RDNA (DIAGNOSTIC) 1 MG IJ SOLR
INTRAMUSCULAR | Status: AC
  Filled 2017-09-17: qty 1

## 2017-09-17 MED ORDER — DEXAMETHASONE SODIUM PHOSPHATE 10 MG/ML IJ SOLN
INTRAMUSCULAR | Status: DC | PRN
  Administered 2017-09-17: 10 mg via INTRAVENOUS

## 2017-09-17 MED ORDER — SODIUM CHLORIDE 0.9 % IV SOLN: INTRAVENOUS | Status: DC

## 2017-09-17 MED ORDER — FENTANYL CITRATE (PF) 100 MCG/2ML IJ SOLN
INTRAMUSCULAR | Status: DC | PRN
  Administered 2017-09-17: 50 ug via INTRAVENOUS

## 2017-09-17 MED ORDER — CIPROFLOXACIN IN D5W 400 MG/200ML IV SOLN
INTRAVENOUS | Status: AC
  Filled 2017-09-17: qty 200

## 2017-09-17 MED ORDER — PROPOFOL 10 MG/ML IV BOLUS
INTRAVENOUS | Status: DC | PRN
  Administered 2017-09-17: 200 mg via INTRAVENOUS

## 2017-09-17 MED ORDER — MIDAZOLAM HCL 2 MG/2ML IJ SOLN
INTRAMUSCULAR | Status: AC
  Filled 2017-09-17: qty 2

## 2017-09-17 MED ORDER — INDOMETHACIN 50 MG RE SUPP
RECTAL | Status: AC
  Filled 2017-09-17: qty 2

## 2017-09-17 MED ORDER — MIDAZOLAM HCL 5 MG/5ML IJ SOLN
INTRAMUSCULAR | Status: DC | PRN
  Administered 2017-09-17: 2 mg via INTRAVENOUS

## 2017-09-17 MED ORDER — SUCCINYLCHOLINE CHLORIDE 20 MG/ML IJ SOLN
INTRAMUSCULAR | Status: DC | PRN
  Administered 2017-09-17: 120 mg via INTRAVENOUS

## 2017-09-17 MED ORDER — PROPOFOL 10 MG/ML IV BOLUS
INTRAVENOUS | Status: AC
  Filled 2017-09-17: qty 20

## 2017-09-17 MED ORDER — ONDANSETRON HCL 4 MG/2ML IJ SOLN
INTRAMUSCULAR | Status: DC | PRN
  Administered 2017-09-17: 4 mg via INTRAVENOUS

## 2017-09-17 MED ORDER — LIDOCAINE HCL (CARDIAC) 20 MG/ML IV SOLN
INTRAVENOUS | Status: DC | PRN
  Administered 2017-09-17: 100 mg via INTRAVENOUS
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
## 2017-09-17 NOTE — Anesthesia Preprocedure Evaluation (Addendum)
Anesthesia Evaluation  Patient identified by MRN, date of birth, ID band Patient awake    Reviewed: Allergy & Precautions, H&P , NPO status , Patient's Chart, lab work & pertinent test results, reviewed documented beta blocker date and time   Airway Mallampati: II  TM Distance: >3 FB Neck ROM: Full    Dental no notable dental hx. (+) Dental Advisory Given   Pulmonary asthma , sleep apnea , former smoker,    Pulmonary exam normal breath sounds clear to auscultation       Cardiovascular hypertension, Pt. on medications  Rhythm:Regular Rate:Normal     Neuro/Psych negative neurological ROS     GI/Hepatic negative GI ROS, Neg liver ROS,   Endo/Other  Morbid obesity  Renal/GU negative Renal ROS     Musculoskeletal   Abdominal   Peds  Hematology negative hematology ROS (+)   Anesthesia Other Findings   Reproductive/Obstetrics                             Lab Results  Component Value Date   WBC 6.1 09/10/2017   HGB 13.0 09/10/2017   HCT 39.3 09/10/2017   MCV 89.1 09/10/2017   PLT 314 09/10/2017   Lab Results  Component Value Date   CREATININE 0.82 09/10/2017   BUN 14 09/10/2017   NA 140 09/10/2017   K 3.7 09/10/2017   CL 108 09/10/2017   CO2 25 09/10/2017    Anesthesia Physical  Anesthesia Plan  ASA: III  Anesthesia Plan: General   Post-op Pain Management:    Induction: Intravenous  PONV Risk Score and Plan: 4 or greater and Ondansetron, Dexamethasone, Scopolamine patch - Pre-op and Treatment may vary due to age or medical condition  Airway Management Planned: Oral ETT  Additional Equipment:   Intra-op Plan:   Post-operative Plan: Extubation in OR  Informed Consent: I have reviewed the patients History and Physical, chart, labs and discussed the procedure including the risks, benefits and alternatives for the proposed anesthesia with the patient or authorized  representative who has indicated his/her understanding and acceptance.   Dental advisory given  Plan Discussed with: CRNA  Anesthesia Plan Comments:         Anesthesia Quick Evaluation

## 2017-09-17 NOTE — Op Note (Signed)
[facility] Patient Name: [name] Procedure Date: 09/17/2017 MRN: [id number] Attending MD: Carol Ada , MD Date of Birth: Oct 28, 1985 CSN: [id number] Age: 31 Admit Type: Outpatient Procedure:                Upper EUS Indications:              Abnormal ultrasound of the abdomen Providers:                Carol Ada, MD, Angus Seller, Cletis Athens,                            Technician Referring MD:              Medicines:                General Anesthesia Complications:            No immediate complications. Estimated Blood Loss:     Estimated blood loss: none. Procedure:                Pre-Anesthesia Assessment:                           - Prior to the procedure, a History and Physical                            was performed, and patient medications and                            allergies were reviewed. The patient's tolerance of                            previous anesthesia was also reviewed. The risks                            and benefits of the procedure and the sedation                            options and risks were discussed with the patient.                            All questions were answered, and informed consent                            was obtained. Prior Anticoagulants: The patient has                            taken no previous anticoagulant or antiplatelet                            agents. ASA Grade Assessment: II - A patient with                            mild systemic disease. After reviewing the risks                            and  benefits, the patient was deemed in                            satisfactory condition to undergo the procedure.                           - Sedation was administered by an anesthesia                            professional. General anesthesia was attained.                           After obtaining informed consent, the endoscope was                            passed under direct vision. Throughout  the                            procedure, the patient's blood pressure, pulse, and                            oxygen saturations were monitored continuously. The                            [id number] ([id number]) scope was introduced through                            the mouth, and advanced to the second part of                            duodenum. The upper EUS was accomplished without                            difficulty. The patient tolerated the procedure                            well. Scope In: Scope Out: Findings:      Endosonographic Finding :      There was no sign of significant endosonographic abnormality in the       common bile duct. The maximum diameter of the duct was 6 mm. No masses,       no stones and no biliary sludge were identified.      A clear view of the CBD was obtained throughout its entire length. There       was no evidence of any stones or sludge. The CBD throughout its entire       course measured at 6.5 mm. As a result the ERCP was not performed. Impression:               - There was no sign of significant pathology in the                            common bile duct.                           -  No specimens collected. Moderate Sedation:      N/A- Per Anesthesia Care Recommendation:           - Patient has a contact number available for                            emergencies. The signs and symptoms of potential                            delayed complications were discussed with the                            patient. Return to normal activities tomorrow.                            Written discharge instructions were provided to the                            patient.                           - Resume previous diet.                           - Return to GI clinic in 4 weeks. Patient does                            require further evaluation of her hematochezia. Procedure Code(s):        --- Professional ---                           [phone],  Esophagogastroduodenoscopy, flexible,                            transoral; with endoscopic ultrasound examination                            limited to the esophagus, stomach or duodenum, and                            adjacent structures Diagnosis Code(s):        --- Professional ---                           R93.5, Abnormal findings on diagnostic imaging of                            other abdominal regions, including retroperitoneum CPT copyright 2016 American Medical Association. All rights reserved. The codes documented in this report are preliminary and upon coder review may  be revised to meet current compliance requirements. Carol Ada, MD Carol Ada, MD 09/17/2017 1:39:16 PM This report has been signed electronically. Number of Addenda: 0

## 2017-09-17 NOTE — Discharge Instructions (Signed)

## 2017-09-17 NOTE — Anesthesia Procedure Notes (Signed)

## 2017-09-17 NOTE — Transfer of Care (Signed)
Immediate Anesthesia Transfer of Care Note  Patient: [name]  Procedure(s) Performed: UPPER ENDOSCOPIC ULTRASOUND (EUS) LINEAR (N/A ) ENDOSCOPIC RETROGRADE CHOLANGIOPANCREATOGRAPHY (ERCP) WITH PROPOFOL (N/A )  Patient Location: PACU  Anesthesia Type:General  Level of Consciousness: awake, alert  and oriented  Airway & Oxygen Therapy: Patient Spontanous Breathing and Patient connected to face mask oxygen  Post-op Assessment: Report given to RN and Post -op Vital signs reviewed and stable  Post vital signs: Reviewed and stable  Last Vitals:  Vitals:   09/17/17 1205  BP: 132/63  Pulse: 60  Resp: (!) 21  Temp: 36.7 C  SpO2: 98%    Last Pain:  Vitals:   09/17/17 1205  TempSrc: Oral  PainSc: 3          Complications: No apparent anesthesia complications

## 2017-09-18 ENCOUNTER — Other Ambulatory Visit: Payer: Self-pay

## 2017-09-18 NOTE — Patient Outreach (Signed)
[place] [facility]) Care Management  09/18/2017  [name] 1985-12-20 [id number]   Subjective: none  Objective: none  Assessment:  31 year with history of acute heart failure post delivery. Emergency room visits with increased blood pressure, abdominal pain/gallstones. laparoscopic cholecystectomy on 09/11/17 followed by upper endosccopic ultrasound on 12/11.  RNCM called to follow up. No answer. HIPPA compliant message left.  Plan: await return call. continue to follow.  Thea Silversmith, RN, MSN, [place] Coordinator Cell: [phone]
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
## 2017-09-19 ENCOUNTER — Encounter (HOSPITAL_COMMUNITY): Payer: Self-pay | Admitting: Gastroenterology

## 2017-09-19 ENCOUNTER — Other Ambulatory Visit: Payer: Self-pay

## 2017-09-19 ENCOUNTER — Ambulatory Visit: Payer: Self-pay

## 2017-09-19 NOTE — Patient Outreach (Signed)
[place] [facility]) Care Management  09/19/2017  [name] October 26, 1985 [id number]   Subjective: "I am doing ok"  Objective: none  Assessment: 31 year with history of acute heartfailurepost delivery. Emergency room visits with increased blood pressure, abdominal pain/gallstones. laparoscopic cholecystectomy on 09/11/17 followed by upper endoscopic ultrasound on 12/11.  RNCM called for transition of care. She will not be place in transition of care program due to this was outpatient procedure. Discharge instructions reviewed.  Client is without questions or concerns.  Plan: home visit rescheduled for next week.  Thea Silversmith, RN, MSN, [place] Coordinator Cell: [phone]

## 2017-09-20 NOTE — Anesthesia Postprocedure Evaluation (Signed)
Anesthesia Post Note  Patient: [name]  Procedure(s) Performed: UPPER ENDOSCOPIC ULTRASOUND (EUS) LINEAR (N/A )     Patient location during evaluation: PACU Anesthesia Type: General Level of consciousness: awake and alert Pain management: pain level controlled Vital Signs Assessment: post-procedure vital signs reviewed and stable Respiratory status: spontaneous breathing, nonlabored ventilation, respiratory function stable and patient connected to nasal cannula oxygen Cardiovascular status: blood pressure returned to baseline and stable Postop Assessment: no apparent nausea or vomiting Anesthetic complications: no    Last Vitals:  Vitals:   09/17/17 1410 09/17/17 1415  BP: (!) 142/81   Pulse: 69 70  Resp: 20 19  Temp:    SpO2: 95% 95%    Last Pain:  Vitals:   09/17/17 1346  TempSrc: Oral  PainSc:                  Riccardo Dubin

## 2017-09-24 ENCOUNTER — Telehealth: Payer: Self-pay | Admitting: Cardiology

## 2017-09-24 NOTE — Telephone Encounter (Signed)
Can you please fit her on PA schedule in the next 3 days? Thank you, KN

## 2017-09-24 NOTE — Telephone Encounter (Signed)
Spoke with the pt and informed her of recommendations per Dr Meda Coffee.  Scheduled the pt to come in and see Vin PA-C for this Thursday 12/20 at 2:30 pm. Pt aware to arrive 15 mins prior to this appt.  Pt verbalized understanding and agrees with this plan.

## 2017-09-24 NOTE — Telephone Encounter (Addendum)
Received Walk-in form:  Pt states she has been having chest pain mid-chest  for 3 days, it comes and goes, sharp pain that lasts for 5 minutes, no pattern, at rest or exertion, helps if she moves her arms or sits still, wonders if it is gas. Pt states she has had similar pain in the past when she was pregnant.  Pt states BP today at [facility] today was 142/92, HR 73 and 66--she has had her gall bladder removed and 2 endoscopic GI procedures recently, concerned that may have some effect on her BP.   Copied from HTN Clinic note 08/09/17:  1. Hypertension - Blood pressure today was 124/84, which is very close to her goal of <130/80. Will continue amlodipine 5mg  daily and furosemide 40mg  daily as needed. Patient is motivated to lose weight after recent pregnancy. Encouraged her to continue staying active and eat a low sodium diet. Referral to nutritionist has been made per patient request. Advised patient to continue monitoring her blood pressure at home and to contact us with any concerns. Follow up in hypertension clinic as needed.  Pt states about 2 weeks after visit to HTN Clinic 08/09/17 she was advised by her PCP to take propranolol 20 mg bid for palpitations, pt stopped amlodipine at that time, she is currently talking propranolol 20 mg bid also.  Pt advised to restart amlodipine 5 mg daily, limit salt in her diet, take and record BP daily, call office in 10-14 days with readings. Pt advised I will forward to Dr Meda Coffee and Hypertension Clinic for review.

## 2017-09-24 NOTE — Telephone Encounter (Signed)
Walk in pt form-patient needs call back please. Gave to Triage

## 2017-09-25 ENCOUNTER — Other Ambulatory Visit: Payer: Self-pay

## 2017-09-25 ENCOUNTER — Ambulatory Visit: Payer: Self-pay

## 2017-09-25 NOTE — Patient Outreach (Signed)
[facility]) Care Management   09/25/2017  [name] 23-Sep-1986 [id number]  [name] is an 31 y.o. female  Subjective: "I feel ok"  Objective:  BP 128/80   Pulse 63   Ht 1.651 m (5\' 5" )   Wt 271 lb (122.9 kg)   LMP 08/28/2017   SpO2 98%   BMI 45.10 kg/m   Review of Systems  Respiratory: Negative.   Cardiovascular: Negative.     Physical Exam  Encounter Medications:   Outpatient Encounter Medications as of 09/25/2017  Medication Sig  . acetaminophen (TYLENOL) 500 MG tablet Take 500 mg by mouth every 8 (eight) hours as needed for headache.  . albuterol (PROVENTIL HFA;VENTOLIN HFA) 108 (90 BASE) MCG/ACT inhaler Inhale 2 puffs into the lungs every 6 (six) hours as needed for wheezing or shortness of breath.  . ALPRAZolam (XANAX) 0.25 MG tablet Take 0.25 mg by mouth daily as needed for anxiety.   Marland Kitchen amLODipine (NORVASC) 5 MG tablet Take 1 tablet (5 mg total) by mouth daily.  . diphenhydrAMINE (BENADRYL) 25 mg capsule Take 1 capsule (25 mg total) by mouth every 6 (six) hours as needed. (Patient taking differently: Take 25 mg by mouth every 6 (six) hours as needed for allergies or sleep. )  . Fluticasone-Salmeterol (ADVAIR) 250-50 MCG/DOSE AEPB Inhale 2 puffs into the lungs daily.  . furosemide (LASIX) 40 MG tablet Take 1 tablet (40 mg total) by mouth daily. (Patient taking differently: Take 40 mg by mouth daily as needed for fluid. )  . HYDROcodone-acetaminophen (NORCO/VICODIN) 5-325 MG tablet Take 1-2 tablets by mouth every 6 (six) hours as needed for moderate pain or severe pain.  Marland Kitchen ibuprofen (ADVIL,MOTRIN) 600 MG tablet Take 600 mg by mouth every 8 (eight) hours as needed for moderate pain.  Marland Kitchen metoCLOPramide (REGLAN) 10 MG tablet Take 1 tablet (10 mg total) by mouth 3 (three) times daily as needed for nausea.  Marland Kitchen NUVARING 0.12-0.015 MG/24HR vaginal ring Place 1 each vaginally every 28 (twenty-eight) days.   . ondansetron (ZOFRAN) 4 MG tablet Take 1  tablet (4 mg total) by mouth every 6 (six) hours. (Patient taking differently: Take 4 mg by mouth 2 (two) times daily as needed for nausea or vomiting. )  . Prenatal Vit-Fe Fumarate-FA (PRENATAL MULTIVITAMIN) TABS tablet Take 1 tablet by mouth daily at 12 noon. (Patient not taking: Reported on 07/30/2017)  . propranolol (INDERAL) 20 MG tablet Take 20 mg by mouth 2 (two) times daily.   . sertraline (ZOLOFT) 50 MG tablet Take 1 tablet (50 mg total) by mouth daily.  . sucralfate (CARAFATE) 1 g tablet Take 1 g by mouth 4 (four) times daily -  with meals and at bedtime.   No facility-administered encounter medications on file as of 09/25/2017.     Functional Status:   In your present state of health, do you have any difficulty performing the following activities: 09/10/2017 06/28/2017  Hearing? N N  Vision? N N  Difficulty concentrating or making decisions? N N  Walking or climbing stairs? N N  Dressing or bathing? N N  Doing errands, shopping? N -  Preparing Food and eating ? - -  Using the Toilet? - -  In the past six months, have you accidently leaked urine? - -  Do you have problems with loss of bowel control? - -  Managing your Medications? - -  Managing your Finances? - -  Housekeeping or managing your Housekeeping? - -  Some recent  data might be hidden    Fall/Depression Screening:    Fall Risk  09/09/2017 07/09/2017 06/17/2017  Falls in the past year? No No No   PHQ 2/9 Scores 09/09/2017 07/09/2017 07/03/2017 06/27/2017 06/17/2017  PHQ - 2 Score 2 1 1 1  -  PHQ- 9 Score 12 - - - -  Exception Documentation - - - - Other- indicate reason in comment box  Not completed - - - - Patient was in [facility] ED during our phone call. She allowed her husband to finish completing the screening questions.     Assessment:  8 yearwith history ofacute heartfailurepost delivery.Emergency room visits withincreased blood pressure, abdominal pain/gallstones.laparoscopic cholecystectomy on 12/5/18followed  by upper endoscopic ultrasound on 12/11.  Home visit completed. Client reports she previously had a concern about increased blood pressure. She states she called cardiologist office and amlodipine was restarted on yesterday. Blood pressure manual per RNCM was 128/80 pulse 63. Compared with client's automatic cuff, 118/88 pulse 62. Client reports she has a follow up appointment at cardiologist office on tomorrow.  Client continues to weigh and record weights, she report she is seeing a nutritionist and tries to watch her sodium intake. No questions or concerns at this time.  RNCM discussed case closure and client is in agreemen  Client to call RNCM if her needs change. RNCM also encouraged client to call 24 hour nurse advice line as needed.  Plan: close case.

## 2017-09-26 ENCOUNTER — Encounter: Payer: Self-pay | Admitting: Physician Assistant

## 2017-09-26 ENCOUNTER — Ambulatory Visit (INDEPENDENT_AMBULATORY_CARE_PROVIDER_SITE_OTHER): Payer: Medicare HMO | Admitting: Physician Assistant

## 2017-09-26 VITALS — BP 106/64 | HR 93 | Resp 16 | Ht 65.0 in | Wt 272.8 lb

## 2017-09-26 DIAGNOSIS — I10 Essential (primary) hypertension: Secondary | ICD-10-CM | POA: Diagnosis not present

## 2017-09-26 DIAGNOSIS — R0602 Shortness of breath: Secondary | ICD-10-CM | POA: Diagnosis not present

## 2017-09-26 DIAGNOSIS — R5383 Other fatigue: Secondary | ICD-10-CM

## 2017-09-26 DIAGNOSIS — R079 Chest pain, unspecified: Secondary | ICD-10-CM

## 2017-09-26 MED ORDER — PANTOPRAZOLE SODIUM 40 MG PO TBEC: 40.0000 mg | DELAYED_RELEASE_TABLET | Freq: Every day | ORAL | 11 refills | Status: DC

## 2017-09-26 NOTE — Patient Instructions (Addendum)
Medication Instructions:  Your physician has recommended you make the following change in your medication:  1.  START Protonix 40 mg daily  Labwork: TODAY:  BMP, PRO BNP, TSH, & CBC  Testing/Procedures: None ordered  Follow-Up: Your physician recommends that you schedule a follow-up appointment in: 3 WEEKS WITH PA ON [name] CARE TEAM ON A DAY [name] IS IN THE OFFICE   Any Other Special Instructions Will Be Listed Below (If Applicable).     If you need a refill on your cardiac medications before your next appointment, please call your pharmacy.
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
## 2017-09-27 LAB — BASIC METABOLIC PANEL
BUN/Creatinine Ratio: 19 (ref 9–23)
BUN: 17 mg/dL (ref 6–20)
CO2: 20 mmol/L (ref 20–29)
Calcium: 9.5 mg/dL (ref 8.7–10.2)
Chloride: 102 mmol/L (ref 96–106)
Creatinine, Ser: 0.91 mg/dL (ref 0.57–1.00)
GFR calc Af Amer: 99 mL/min/{1.73_m2} (ref >59)
GFR calc non Af Amer: 86 mL/min/{1.73_m2} (ref >59)
Glucose: 90 mg/dL (ref 65–99)
Potassium: 4.7 mmol/L (ref 3.5–5.2)
Sodium: 140 mmol/L (ref 134–144)

## 2017-09-27 LAB — CBC
Hematocrit: 38.3 % (ref 34.0–46.6)
Hemoglobin: 12.6 g/dL (ref 11.1–15.9)
MCH: 29.3 pg (ref 26.6–33.0)
MCHC: 32.9 g/dL (ref 31.5–35.7)
MCV: 89 fL (ref 79–97)
Platelets: 323 10*3/uL (ref 150–379)
RBC: 4.3 x10E6/uL (ref 3.77–5.28)
RDW: 15 % (ref 12.3–15.4)
WBC: 9.4 10*3/uL (ref 3.4–10.8)

## 2017-09-27 LAB — TSH: TSH: 1.77 u[IU]/mL (ref 0.450–4.500)

## 2017-09-27 LAB — PRO B NATRIURETIC PEPTIDE: NT-Pro BNP: 37 pg/mL (ref 0–130)

## 2017-10-11 ENCOUNTER — Ambulatory Visit (INDEPENDENT_AMBULATORY_CARE_PROVIDER_SITE_OTHER): Payer: Medicare HMO | Admitting: Cardiology

## 2017-10-11 ENCOUNTER — Encounter: Payer: Self-pay | Admitting: Cardiology

## 2017-10-11 ENCOUNTER — Encounter: Payer: Self-pay | Admitting: *Deleted

## 2017-10-11 VITALS — BP 116/82 | HR 61 | Ht 65.0 in | Wt 278.4 lb

## 2017-10-11 DIAGNOSIS — I10 Essential (primary) hypertension: Secondary | ICD-10-CM

## 2017-10-11 DIAGNOSIS — I5032 Chronic diastolic (congestive) heart failure: Secondary | ICD-10-CM | POA: Diagnosis not present
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
## 2017-10-11 NOTE — Patient Instructions (Signed)

## 2017-10-16 DIAGNOSIS — R1031 Right lower quadrant pain: Secondary | ICD-10-CM | POA: Diagnosis not present

## 2017-10-16 DIAGNOSIS — R1032 Left lower quadrant pain: Secondary | ICD-10-CM | POA: Diagnosis not present

## 2017-10-16 DIAGNOSIS — R0781 Pleurodynia: Secondary | ICD-10-CM | POA: Diagnosis not present

## 2017-10-16 DIAGNOSIS — K625 Hemorrhage of anus and rectum: Secondary | ICD-10-CM | POA: Diagnosis not present

## 2017-10-25 DIAGNOSIS — R1013 Epigastric pain: Secondary | ICD-10-CM | POA: Diagnosis not present

## 2017-10-25 DIAGNOSIS — I10 Essential (primary) hypertension: Secondary | ICD-10-CM | POA: Diagnosis not present

## 2017-10-25 DIAGNOSIS — E785 Hyperlipidemia, unspecified: Secondary | ICD-10-CM | POA: Diagnosis not present

## 2017-10-25 DIAGNOSIS — J45909 Unspecified asthma, uncomplicated: Secondary | ICD-10-CM | POA: Diagnosis not present

## 2017-10-25 DIAGNOSIS — R7303 Prediabetes: Secondary | ICD-10-CM | POA: Diagnosis not present

## 2017-10-25 DIAGNOSIS — Z720 Tobacco use: Secondary | ICD-10-CM | POA: Diagnosis not present

## 2017-10-25 DIAGNOSIS — R1011 Right upper quadrant pain: Secondary | ICD-10-CM | POA: Diagnosis not present

## 2017-10-25 DIAGNOSIS — E559 Vitamin D deficiency, unspecified: Secondary | ICD-10-CM | POA: Diagnosis not present

## 2017-10-28 ENCOUNTER — Other Ambulatory Visit: Payer: Self-pay | Admitting: Physician Assistant

## 2017-10-28 DIAGNOSIS — R1011 Right upper quadrant pain: Secondary | ICD-10-CM

## 2017-11-04 ENCOUNTER — Encounter (HOSPITAL_COMMUNITY): Payer: Self-pay | Admitting: *Deleted

## 2017-11-04 ENCOUNTER — Inpatient Hospital Stay (HOSPITAL_COMMUNITY)
Admission: AD | Admit: 2017-11-04 | Discharge: 2017-11-04 | Disposition: A | Discharge status: Home / Self Care | Payer: Medicare HMO | Source: Ambulatory Visit | Attending: Obstetrics and Gynecology | Admitting: Obstetrics and Gynecology

## 2017-11-04 DIAGNOSIS — N912 Amenorrhea, unspecified: Secondary | ICD-10-CM

## 2017-11-04 NOTE — MAU Provider Note (Signed)
[name] is a 32 y.o. [id number] who present to MAU today for pregnancy confirmation. She denies abdominal pain or vaginal bleeding.   BP 125/70 (BP Location: Right Arm)   Pulse 80   Temp 98.3 F (36.8 C) (Oral)   Resp 18  CONSTITUTIONAL: Well-developed, well-nourished female in no acute distress.  CARDIOVASCULAR: Normal heart rate noted RESPIRATORY: Effort and breath sounds normal GASTROINTESTINAL:Soft, no distention noted.  No tenderness, rebound or guarding.  SKIN: Skin is warm and dry. No rash noted. Not diaphoretic. No erythema. No pallor. PSYCHIATRIC: Normal mood and affect. Normal behavior. Normal judgment and thought content.  MDM Medical screening exam complete Patient does not endorse any symptoms concerning for ectopic pregnancy or pregnancy related complication today.   A:  Amenorrhea  P: Discharge home Patient advised that she can present as a walk-in to [facility] for a pregnancy test  Reasons to return to MAU reviewed  Patient may return to MAU as needed or if her condition were to change or worsen  Katheren Shams, DO 11/04/2017 11:27 PM

## 2017-11-04 NOTE — MAU Note (Signed)
Pt wants pregnancy confirmation

## 2017-11-04 NOTE — Discharge Instructions (Signed)
We do not do confirmation pregnancy test in the MAU Follow-up in the [facility] clinic M-F 8a-5p for pregnancy test confirmation.

## 2017-11-04 NOTE — MAU Note (Signed)
[name] discussed f/u in clinic for pregnancy confirmation, pt verbalized understanding

## 2017-11-05 ENCOUNTER — Ambulatory Visit
Admission: RE | Admit: 2017-11-05 | Discharge: 2017-11-05 | Disposition: A | Discharge status: Home / Self Care | Payer: Medicare HMO | Source: Ambulatory Visit | Attending: Physician Assistant | Admitting: Physician Assistant

## 2017-11-05 DIAGNOSIS — R1013 Epigastric pain: Secondary | ICD-10-CM | POA: Diagnosis not present

## 2017-11-05 DIAGNOSIS — I10 Essential (primary) hypertension: Secondary | ICD-10-CM | POA: Diagnosis not present

## 2017-11-05 DIAGNOSIS — K760 Fatty (change of) liver, not elsewhere classified: Secondary | ICD-10-CM | POA: Diagnosis not present

## 2017-11-05 DIAGNOSIS — R7303 Prediabetes: Secondary | ICD-10-CM | POA: Diagnosis not present

## 2017-11-05 DIAGNOSIS — E785 Hyperlipidemia, unspecified: Secondary | ICD-10-CM | POA: Diagnosis not present

## 2017-11-05 DIAGNOSIS — Z720 Tobacco use: Secondary | ICD-10-CM | POA: Diagnosis not present

## 2017-11-05 DIAGNOSIS — J45909 Unspecified asthma, uncomplicated: Secondary | ICD-10-CM | POA: Diagnosis not present

## 2017-11-05 DIAGNOSIS — E559 Vitamin D deficiency, unspecified: Secondary | ICD-10-CM | POA: Diagnosis not present

## 2017-11-05 DIAGNOSIS — Z3201 Encounter for pregnancy test, result positive: Secondary | ICD-10-CM | POA: Diagnosis not present

## 2017-11-05 DIAGNOSIS — R1011 Right upper quadrant pain: Secondary | ICD-10-CM

## 2017-11-06 ENCOUNTER — Telehealth: Payer: Self-pay | Admitting: Cardiology

## 2017-11-06 NOTE — Telephone Encounter (Signed)
New message    Patient calling with concerns about heart medication now that she is pregnant. Please call

## 2017-11-06 NOTE — Telephone Encounter (Signed)
Pt calling to let Dr Meda Coffee know that she went to her OBGYN yesterday, and found out she is pregnant.  Pt reports that they drew labs on her to confirm how many weeks pregnant she is.  Pt reports that she is awaiting those results and for the NP to endorse how far along she is.  Pt wanted to let Dr Meda Coffee know this, for she takes several cardiac meds, and wants to know does she continue her regimen as is, or should her regimen be changed? Endorsed to the pt that Dr Meda Coffee is out of the office today, but I will route this message to her for further review and recommendation, and follow-up with the pt shortly thereafter.  Pt verbalized understanding and agrees with this plan.

## 2017-11-07 ENCOUNTER — Other Ambulatory Visit: Payer: Self-pay | Admitting: Cardiology

## 2017-11-07 MED ORDER — LABETALOL HCL 200 MG PO TABS: 200.0000 mg | ORAL_TABLET | Freq: Two times a day (BID) | ORAL | 1 refills | Status: DC

## 2017-11-07 NOTE — Telephone Encounter (Signed)
Left a message for the pt to call back to endorse med recommendations and follow-up, per Dr Meda Coffee.

## 2017-11-07 NOTE — Telephone Encounter (Signed)
Pt is scheduled to see Dayna Dunn PA-C for 11/25/17 at 0930 for follow-up of cardiac meds changed and switched to a regimen safe with pregnancy. Pt to follow-up per Dr Meda Coffee.  Appt with Lisbeth Renshaw was previously scheduled at the pts last OV with Dr Meda Coffee.  Pt agreed to appt date and time.

## 2017-11-07 NOTE — Telephone Encounter (Signed)
Spoke with the pt and informed her that per Dr Meda Coffee, we will d/c her amlodipine and propranolol, and start her on labetalol 200 mg po bid, and she will need an appt with Pharmacist in HTN clinic in 2 weeks to titrate as needed, and an appt with Dr Meda Coffee, at her next available appt.  Confirmed the pharmacy of choice with the pt.  Informed the pt that I will send a message to our schedulers to call her back and arrange for her 2 week appt with HTN clinic and follow-up appt with Dr Meda Coffee.  Pt verbalized understanding and agrees with this plan.

## 2017-11-07 NOTE — Telephone Encounter (Signed)
Please discontinue amlodipine and propranolol, start labetalol 200 mg by mouth twice a day, schedule her with the pharmacist in a week or two 2 titrate as needed, please schedule her with me my next available appointment.

## 2017-11-11 ENCOUNTER — Ambulatory Visit: Payer: Medicare HMO | Admitting: Registered"

## 2017-11-12 DIAGNOSIS — N912 Amenorrhea, unspecified: Secondary | ICD-10-CM | POA: Diagnosis not present

## 2017-11-12 DIAGNOSIS — Z3201 Encounter for pregnancy test, result positive: Secondary | ICD-10-CM | POA: Diagnosis not present

## 2017-11-14 DIAGNOSIS — Z3491 Encounter for supervision of normal pregnancy, unspecified, first trimester: Secondary | ICD-10-CM | POA: Diagnosis not present

## 2017-11-18 ENCOUNTER — Ambulatory Visit: Payer: Medicare HMO | Admitting: Cardiology

## 2017-11-19 ENCOUNTER — Ambulatory Visit (INDEPENDENT_AMBULATORY_CARE_PROVIDER_SITE_OTHER): Payer: Medicare HMO | Admitting: Pharmacist

## 2017-11-19 VITALS — BP 124/84 | HR 70

## 2017-11-19 DIAGNOSIS — I10 Essential (primary) hypertension: Secondary | ICD-10-CM

## 2017-11-19 NOTE — Progress Notes (Signed)
Patient ID: [name]                 DOB: [birth year]                      MRN: [id number]     HPI: [name] is a 32 y.o. female patient of [name] who presents today for hypertension evaluation. PMH significant for obesity, HTN, OSA, Asthma, Bioplar 1 disorder, depression, former tobacco abuse, and prior alcohol/cannibis use. Her HTN was previously controlled on amlodipine 5mg . However, she recently called to clinic to report that she was pregnant. Her amlodipine was stopped and she was started on labetalol 200mg  BID for control while pregnant.   She presents today with her 2 daughters and partner. She reports that she does have stars in her vision occasionally since starting the medication. This happens mostly when she stands up. She has not had chest pain or SOB since starting the medication. She believes she is doing well on the labetalol.   She does report that she has some swelling in her lower legs. This does seem to go down in the morning after sleeping, but slowly builds throughout the day. She is wearing compression stockings and says these help some.   Current HTN meds:  Furosemide 40mg  daily - pt taking PRN? Labetalol 200mg  BID (10am and 9pm)  Previously tried: amlodipine - stopped due to pregnancy   BP goal: <130/80  Family History: Asthma in her father and sister; Breast cancer in her maternal grandmother; CAD in her other; Cancer in her maternal grandfather, maternal grandmother, and other; Diabetes in her mother; Diverticulitis in her father and mother; Heart disease in her paternal grandfather and paternal grandmother; Hypertension in her paternal aunt and sister; Stomach cancer in her paternal grandfather.  Social History: Quit tobacco in 2014. Last alcoholic in 6073 as well.   Diet: She is mindful about sodium intake. She does eat fast food occasionally. She mostly eats from home. She drinks mostly water and beet juice. She does drink carrot juice as well.     Exercise: Walk and climbs stairs in the home daily.  Home BP readings: OB appt - 145/80 had been on labetalol for 4 days   Wt Readings from Last 3 Encounters:  10/11/17 278 lb 6.4 oz (126.3 kg)  09/26/17 272 lb 12.8 oz (123.7 kg)  09/25/17 271 lb (122.9 kg)   BP Readings from Last 3 Encounters:  11/19/17 124/84  11/04/17 125/70  10/11/17 116/82   Pulse Readings from Last 3 Encounters:  11/19/17 70  11/04/17 80  10/11/17 61    Renal function: CrCl cannot be calculated (Patient's most recent lab result is older than the maximum 21 days allowed.).  Past Medical History:  Diagnosis Date  . Anxiety   . Asthma   . Bipolar 1 disorder ([place])   . Chronic cholecystitis with calculus   . Depression   . Family history of adverse reaction to anesthesia    3rd cousin died during laser eye surgery age 70  . Headache    h/o migraines none recent  . History of acute heart failure cardioloigst-  dr Ena Dawley   06-17-2017 post partum secondary to IV fluid overload w/  normal echo  . Hypertension   . Mild obstructive sleep apnea 01/28/2013 study   no cpap recommeded ;  recommended do not sleep supine, wt loss, sleep apnea surgery  during pregnacy    Current Outpatient Medications  on File Prior to Visit  Medication Sig Dispense Refill  . acetaminophen (TYLENOL) 500 MG tablet Take 500 mg by mouth every 8 (eight) hours as needed for headache.    . albuterol (PROVENTIL HFA;VENTOLIN HFA) 108 (90 BASE) MCG/ACT inhaler Inhale 2 puffs into the lungs every 6 (six) hours as needed for wheezing or shortness of breath.    . ALPRAZolam (XANAX) 0.25 MG tablet Take 0.25 mg by mouth daily as needed for anxiety.   0  . diphenhydrAMINE (BENADRYL) 25 mg capsule Take 1 capsule (25 mg total) by mouth every 6 (six) hours as needed. (Patient taking differently: Take 25 mg by mouth every 6 (six) hours as needed for allergies or sleep. ) 30 capsule 0  . furosemide (LASIX) 40 MG tablet Take 1 tablet (40 mg  total) by mouth daily. (Patient taking differently: Take 40 mg by mouth daily as needed for fluid. ) 90 tablet 1  . HYDROcodone-acetaminophen (NORCO/VICODIN) 5-325 MG tablet Take 1-2 tablets by mouth every 6 (six) hours as needed for moderate pain or severe pain. 30 tablet 0  . ibuprofen (ADVIL,MOTRIN) 600 MG tablet Take 600 mg by mouth every 8 (eight) hours as needed for moderate pain.    Marland Kitchen labetalol (NORMODYNE) 200 MG tablet TAKE 1 TABLET(200 MG) BY MOUTH TWICE DAILY 180 tablet 3  . pantoprazole (PROTONIX) 40 MG tablet Take 1 tablet (40 mg total) by mouth daily. 30 tablet 11  . sucralfate (CARAFATE) 1 g tablet Take 1 g by mouth 4 (four) times daily -  with meals and at bedtime.     No current facility-administered medications on file prior to visit.     Allergies  Allergen Reactions  . Haldol [Haloperidol] Shortness Of Breath, Palpitations and Rash    "difficulty breathing"  . Depakote [Divalproex Sodium]   . Pineapple Swelling  . Strawberry Extract Swelling  . Divalproex Sodium Hives and Rash    Blood pressure 124/84, pulse 70, unknown if currently breastfeeding.   Assessment/Plan: Hypertension: BP today well controlled. Continue labetalol 200mg  BID. Monitor dizziness and call with concerns. Advised to keep feet elevated as much as possible. We did discuss that loop diuretics are preferred diuretic in pregnancy (rather than thiazides), but can be associated with risks to the fetus related to the potential for intravascular volume contraction and reduced placental perfusion. Therefore, would recommend use furosemide as sparingly as possible. She will follow up with [name], PA as scheduled and HTN clinic as needed.    Thank you, Lelan Pons. Patterson Hammersmith, [facility]  11/19/2017 1:56 PM

## 2017-11-19 NOTE — Patient Instructions (Addendum)
Return for a follow up appointment as scheduled with PA   Check your blood pressure at home daily (if able) and keep record of the readings.  Take your BP meds as follows: CONTINUE labetalol 200mg  TWICE DAILY   Bring all of your meds, your BP cuff and your record of home blood pressures to your next appointment.  Exercise as you're able, try to walk approximately 30 minutes per day.  Keep salt intake to a minimum, especially watch canned and prepared boxed foods.  Eat more fresh fruits and vegetables and fewer canned items.  Avoid eating in fast food restaurants.    HOW TO TAKE YOUR BLOOD PRESSURE: . Rest 5 minutes before taking your blood pressure. .  Don't smoke or drink caffeinated beverages for at least 30 minutes before. . Take your blood pressure before (not after) you eat. . Sit comfortably with your back supported and both feet on the floor (don't cross your legs). . Elevate your arm to heart level on a table or a desk. . Use the proper sized cuff. It should fit smoothly and snugly around your bare upper arm. There should be enough room to slip a fingertip under the cuff. The bottom edge of the cuff should be 1 inch above the crease of the elbow. . Ideally, take 3 measurements at one sitting and record the average.

## 2017-11-22 ENCOUNTER — Encounter: Payer: Self-pay | Admitting: Physician Assistant

## 2017-11-22 ENCOUNTER — Emergency Department (HOSPITAL_COMMUNITY)
Admission: EM | Admit: 2017-11-22 | Discharge: 2017-11-22 | Disposition: A | Discharge status: Home / Self Care | Payer: Medicare HMO | Attending: Emergency Medicine | Admitting: Emergency Medicine

## 2017-11-22 DIAGNOSIS — R0602 Shortness of breath: Secondary | ICD-10-CM | POA: Diagnosis not present

## 2017-11-22 DIAGNOSIS — O161 Unspecified maternal hypertension, first trimester: Secondary | ICD-10-CM | POA: Diagnosis not present

## 2017-11-22 DIAGNOSIS — Z87891 Personal history of nicotine dependence: Secondary | ICD-10-CM | POA: Diagnosis not present

## 2017-11-22 DIAGNOSIS — J45909 Unspecified asthma, uncomplicated: Secondary | ICD-10-CM | POA: Diagnosis not present

## 2017-11-22 DIAGNOSIS — Z79899 Other long term (current) drug therapy: Secondary | ICD-10-CM | POA: Diagnosis not present

## 2017-11-22 DIAGNOSIS — R110 Nausea: Secondary | ICD-10-CM | POA: Insufficient documentation

## 2017-11-22 DIAGNOSIS — Z3A08 8 weeks gestation of pregnancy: Secondary | ICD-10-CM | POA: Diagnosis not present

## 2017-11-22 DIAGNOSIS — O9989 Other specified diseases and conditions complicating pregnancy, childbirth and the puerperium: Secondary | ICD-10-CM | POA: Insufficient documentation

## 2017-11-22 DIAGNOSIS — R519 Headache, unspecified: Secondary | ICD-10-CM

## 2017-11-22 DIAGNOSIS — R002 Palpitations: Secondary | ICD-10-CM | POA: Diagnosis not present

## 2017-11-22 DIAGNOSIS — R51 Headache: Secondary | ICD-10-CM | POA: Insufficient documentation

## 2017-11-22 LAB — COMPREHENSIVE METABOLIC PANEL
ALT: 47 U/L (ref 14–54)
AST: 29 U/L (ref 15–41)
Albumin: 3.4 g/dL — ABNORMAL LOW (ref 3.5–5.0)
Alkaline Phosphatase: 68 U/L (ref 38–126)
Anion gap: 9 (ref 5–15)
BUN: 10 mg/dL (ref 6–20)
CO2: 23 mmol/L (ref 22–32)
Calcium: 9.3 mg/dL (ref 8.9–10.3)
Chloride: 107 mmol/L (ref 101–111)
Creatinine, Ser: 0.81 mg/dL (ref 0.44–1.00)
GFR calc Af Amer: >60 mL/min (ref >60)
GFR calc non Af Amer: >60 mL/min (ref >60)
Glucose, Bld: 100 mg/dL — ABNORMAL HIGH (ref 65–99)
Potassium: 3.9 mmol/L (ref 3.5–5.1)
Sodium: 139 mmol/L (ref 135–145)
Total Bilirubin: 0.3 mg/dL (ref 0.3–1.2)
Total Protein: 6.8 g/dL (ref 6.5–8.1)

## 2017-11-22 LAB — URINALYSIS, ROUTINE W REFLEX MICROSCOPIC
Bilirubin Urine: NEGATIVE
Glucose, UA: NEGATIVE mg/dL
Hgb urine dipstick: NEGATIVE
Ketones, ur: 5 mg/dL — AB
Leukocytes, UA: NEGATIVE
Nitrite: NEGATIVE
Protein, ur: 30 mg/dL — AB
Specific Gravity, Urine: 1.024 (ref 1.005–1.030)
pH: 6 (ref 5.0–8.0)

## 2017-11-22 LAB — CBC
HCT: 35.1 % — ABNORMAL LOW (ref 36.0–46.0)
Hemoglobin: 11.6 g/dL — ABNORMAL LOW (ref 12.0–15.0)
MCH: 29.3 pg (ref 26.0–34.0)
MCHC: 33 g/dL (ref 30.0–36.0)
MCV: 88.6 fL (ref 78.0–100.0)
Platelets: 296 10*3/uL (ref 150–400)
RBC: 3.96 MIL/uL (ref 3.87–5.11)
RDW: 14.3 % (ref 11.5–15.5)
WBC: 7 10*3/uL (ref 4.0–10.5)

## 2017-11-22 LAB — LIPASE, BLOOD: Lipase: 29 U/L (ref 11–51)

## 2017-11-22 LAB — I-STAT TROPONIN, ED: Troponin i, poc: 0 ng/mL (ref 0.00–0.08)

## 2017-11-22 LAB — HCG, QUANTITATIVE, PREGNANCY: hCG, Beta Chain, Quant, S: 12462 m[IU]/mL — ABNORMAL HIGH (ref <5)

## 2017-11-22 LAB — BRAIN NATRIURETIC PEPTIDE: B Natriuretic Peptide: 55.3 pg/mL (ref 0.0–100.0)

## 2017-11-22 NOTE — ED Notes (Signed)
Pt called for recheck v/s, no response from lobby 

## 2017-11-22 NOTE — Discharge Instructions (Signed)
Continue taking all your medications as prescribed. It is very important that your follow-up with your cardiologist and OB/GYN for further evaluation. Return to the emergency room if you develop persistent chest pain, difficulty breathing, or any new or concerning symptoms.
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
## 2017-11-22 NOTE — ED Triage Notes (Signed)
Patient here from home with complaints f headache, chills, nausea, vomiting that started 3 days ago. Reports that she is [redacted] week pregnant. "I think I may have the flu".

## 2017-11-25 ENCOUNTER — Ambulatory Visit: Payer: Medicare HMO | Admitting: Physician Assistant
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
## 2017-11-26 ENCOUNTER — Other Ambulatory Visit: Payer: Self-pay

## 2017-11-26 ENCOUNTER — Encounter: Payer: Self-pay | Admitting: Physician Assistant

## 2017-11-26 ENCOUNTER — Encounter (HOSPITAL_COMMUNITY): Payer: Self-pay | Admitting: Emergency Medicine

## 2017-11-26 ENCOUNTER — Ambulatory Visit (INDEPENDENT_AMBULATORY_CARE_PROVIDER_SITE_OTHER): Payer: Medicare HMO | Admitting: Physician Assistant

## 2017-11-26 VITALS — BP 119/82 | HR 73 | Ht 65.0 in | Wt 288.0 lb

## 2017-11-26 DIAGNOSIS — K219 Gastro-esophageal reflux disease without esophagitis: Secondary | ICD-10-CM

## 2017-11-26 DIAGNOSIS — Z9189 Other specified personal risk factors, not elsewhere classified: Secondary | ICD-10-CM | POA: Diagnosis not present

## 2017-11-26 DIAGNOSIS — J45909 Unspecified asthma, uncomplicated: Secondary | ICD-10-CM

## 2017-11-26 DIAGNOSIS — Z87891 Personal history of nicotine dependence: Secondary | ICD-10-CM | POA: Diagnosis not present

## 2017-11-26 DIAGNOSIS — L509 Urticaria, unspecified: Secondary | ICD-10-CM | POA: Diagnosis not present

## 2017-11-26 DIAGNOSIS — O99711 Diseases of the skin and subcutaneous tissue complicating pregnancy, first trimester: Secondary | ICD-10-CM | POA: Diagnosis not present

## 2017-11-26 DIAGNOSIS — I10 Essential (primary) hypertension: Secondary | ICD-10-CM | POA: Diagnosis not present

## 2017-11-26 DIAGNOSIS — Z79899 Other long term (current) drug therapy: Secondary | ICD-10-CM | POA: Insufficient documentation

## 2017-11-26 DIAGNOSIS — Z3A08 8 weeks gestation of pregnancy: Secondary | ICD-10-CM | POA: Diagnosis not present

## 2017-11-26 DIAGNOSIS — R002 Palpitations: Secondary | ICD-10-CM | POA: Diagnosis not present

## 2017-11-26 DIAGNOSIS — R600 Localized edema: Secondary | ICD-10-CM
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
## 2017-11-26 NOTE — Patient Instructions (Addendum)
Medication Instructions:  Your physician has recommended you make the following change in your medication:  1.  STOP the Propranolol 2.  When reviewing Advair, pharmacy information indicates this should be used with caution during pregnancy - please call your prescribing doctor to discuss if you haven't already done so this pregnancy 3.  Your medicine list included ibuprofen. You should not take this while pregnant. Tylenol is an acceptable alternative as directed.   Labwork: None ordered  Testing/Procedures: None ordered  Follow-Up: Your physician recommends that you schedule a follow-up appointment in: [place]   Any Other Special Instructions Will Be Listed Below (If Applicable). FOLLOW YOUR BLOOD PRESSURE REGULARLY AND IF YOU NOTICE THAT IT IS RUNNING HIGHER THAN 140/90 CALL OUR OFFICE.    If you need a refill on your cardiac medications before your next appointment, please call your pharmacy.

## 2017-11-27 ENCOUNTER — Emergency Department (HOSPITAL_COMMUNITY)
Admission: EM | Admit: 2017-11-27 | Discharge: 2017-11-27 | Disposition: A | Discharge status: Home / Self Care | Payer: Medicare HMO | Attending: Emergency Medicine | Admitting: Emergency Medicine

## 2017-11-27 ENCOUNTER — Other Ambulatory Visit: Payer: Self-pay

## 2017-11-27 DIAGNOSIS — L509 Urticaria, unspecified: Secondary | ICD-10-CM | POA: Diagnosis not present

## 2017-11-27 MED ORDER — DEXAMETHASONE SODIUM PHOSPHATE 10 MG/ML IJ SOLN
10.0000 mg | Freq: Once | INTRAMUSCULAR | Status: AC
  Administered 2017-11-27: 10 mg via INTRAMUSCULAR
  Filled 2017-11-27: qty 1

## 2017-11-27 MED ORDER — CETIRIZINE HCL 5 MG/5ML PO SOLN
5.0000 mg | Freq: Once | ORAL | Status: AC
  Administered 2017-11-27: 5 mg via ORAL
  Filled 2017-11-27: qty 5

## 2017-11-27 NOTE — ED Triage Notes (Signed)
Pt stopped this RN in the waiting room to state that she feels worse. Pt states her lips are swelling. Mild swelling noted to lip. Informed pt we are working as hard as we can to get ppl moved back. Resp WNL
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
## 2017-11-27 NOTE — ED Notes (Signed)
Pt took 50mg  of liquid benadryl while in waiting room.

## 2017-11-28 ENCOUNTER — Encounter (HOSPITAL_COMMUNITY): Payer: Self-pay

## 2017-11-28 ENCOUNTER — Emergency Department (HOSPITAL_COMMUNITY)
Admission: EM | Admit: 2017-11-28 | Discharge: 2017-11-28 | Disposition: A | Discharge status: Home / Self Care | Payer: Medicare HMO | Attending: Emergency Medicine | Admitting: Emergency Medicine

## 2017-11-28 DIAGNOSIS — I10 Essential (primary) hypertension: Secondary | ICD-10-CM | POA: Insufficient documentation

## 2017-11-28 DIAGNOSIS — L509 Urticaria, unspecified: Secondary | ICD-10-CM

## 2017-11-28 DIAGNOSIS — J45909 Unspecified asthma, uncomplicated: Secondary | ICD-10-CM | POA: Insufficient documentation

## 2017-11-28 DIAGNOSIS — Z87891 Personal history of nicotine dependence: Secondary | ICD-10-CM | POA: Insufficient documentation

## 2017-11-28 DIAGNOSIS — Z79899 Other long term (current) drug therapy: Secondary | ICD-10-CM | POA: Insufficient documentation

## 2017-11-28 DIAGNOSIS — R21 Rash and other nonspecific skin eruption: Secondary | ICD-10-CM | POA: Diagnosis present

## 2017-11-28 LAB — CBC WITH DIFFERENTIAL/PLATELET
Basophils Absolute: 0 10*3/uL (ref 0.0–0.1)
Basophils Relative: 0 %
Eosinophils Absolute: 0.1 10*3/uL (ref 0.0–0.7)
Eosinophils Relative: 1 %
HCT: 36.7 % (ref 36.0–46.0)
Hemoglobin: 11.8 g/dL — ABNORMAL LOW (ref 12.0–15.0)
Lymphocytes Relative: 53 %
Lymphs Abs: 4.9 10*3/uL — ABNORMAL HIGH (ref 0.7–4.0)
MCH: 28.9 pg (ref 26.0–34.0)
MCHC: 32.2 g/dL (ref 30.0–36.0)
MCV: 89.7 fL (ref 78.0–100.0)
Monocytes Absolute: 0.4 10*3/uL (ref 0.1–1.0)
Monocytes Relative: 4 %
Neutro Abs: 3.9 10*3/uL (ref 1.7–7.7)
Neutrophils Relative %: 42 %
Platelets: 281 10*3/uL (ref 150–400)
RBC: 4.09 MIL/uL (ref 3.87–5.11)
RDW: 14.5 % (ref 11.5–15.5)
WBC: 9.2 10*3/uL (ref 4.0–10.5)

## 2017-11-28 LAB — URINALYSIS, ROUTINE W REFLEX MICROSCOPIC
Bilirubin Urine: NEGATIVE
Glucose, UA: NEGATIVE mg/dL
Hgb urine dipstick: NEGATIVE
Ketones, ur: NEGATIVE mg/dL
Leukocytes, UA: NEGATIVE
Nitrite: NEGATIVE
Protein, ur: NEGATIVE mg/dL
Specific Gravity, Urine: 1.027 (ref 1.005–1.030)
pH: 5 (ref 5.0–8.0)

## 2017-11-28 LAB — HCG, QUANTITATIVE, PREGNANCY: hCG, Beta Chain, Quant, S: 22634 m[IU]/mL — ABNORMAL HIGH (ref <5)

## 2017-11-28 MED ORDER — METHYLPREDNISOLONE SODIUM SUCC 125 MG IJ SOLR
80.0000 mg | Freq: Once | INTRAMUSCULAR | Status: AC
  Administered 2017-11-28: 80 mg via INTRAVENOUS
  Filled 2017-11-28: qty 2

## 2017-11-28 MED ORDER — DIPHENHYDRAMINE HCL 50 MG/ML IJ SOLN
25.0000 mg | Freq: Once | INTRAMUSCULAR | Status: AC
  Administered 2017-11-28: 25 mg via INTRAVENOUS
  Filled 2017-11-28: qty 1

## 2017-11-28 MED ORDER — FAMOTIDINE 20 MG IN NS 100 ML IVPB
20.0000 mg | Freq: Once | INTRAVENOUS | Status: AC
  Administered 2017-11-28: 20 mg via INTRAVENOUS
  Filled 2017-11-28: qty 100

## 2017-11-28 MED ORDER — METOCLOPRAMIDE HCL 5 MG/ML IJ SOLN
10.0000 mg | Freq: Once | INTRAMUSCULAR | Status: AC
  Administered 2017-11-28: 10 mg via INTRAVENOUS
  Filled 2017-11-28: qty 2

## 2017-11-28 MED ORDER — DIPHENHYDRAMINE HCL 25 MG PO TABS: 25.0000 mg | ORAL_TABLET | Freq: Four times a day (QID) | ORAL | 0 refills | Status: DC

## 2017-11-28 NOTE — ED Triage Notes (Addendum)
Pt presents with continued hives and feeling like her face is swelling; pt seen here 2 days ago for same, was given medication which cleared symptoms.  Pt also reports vaginal discharge that is yellow/white x 2-3 days with lower abdominal cramping that began today.

## 2017-11-28 NOTE — Discharge Instructions (Signed)
Please read attached information. If you experience any new or worsening signs or symptoms please return to the emergency room for evaluation. Please follow-up with your primary care provider or specialist as discussed. Please use medication prescribed only as directed and discontinue taking if you have any concerning signs or symptoms.   °

## 2017-11-28 NOTE — ED Provider Notes (Signed)
Patient placed in Quick Look pathway, seen and evaluated   Chief Complaint: hives  HPI:   Patient reports that she was treated in the ED for hives a couple days ago and they got better while in the ED but have returned. Patient reports swelling of her face and lip, feeling like her throat is swelling  ROS: Skin rash swelling  HEENT: sore throat, lips swelling  GI: abdominal pain   GU: vaginal d/c  Physical Exam:  32 y.o. [id number] @ [redacted] weeks gestation in NAD.   HEENT: throat without erythema or  edema. Lips with mild swelling.   Neuro: Awake and Alert  Skin: hives noted to upper and lower  extremities.     Focused Exam:    Initiation of care has begun. The patient has been counseled on the process, plan, and necessity for staying for the completion/evaluation, and the remainder of the medical screening examination    Ashley Murrain, NP 11/28/17 1736    Daleen Bo, MD 11/29/17 1011
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
## 2017-11-28 NOTE — ED Notes (Signed)
Pt verbalizes understanding of d/c instructions. Pt received prescriptions. Pt ambulatory at d/c with all belongings.  

## 2017-11-29 DIAGNOSIS — E785 Hyperlipidemia, unspecified: Secondary | ICD-10-CM | POA: Diagnosis not present

## 2017-11-29 DIAGNOSIS — R7303 Prediabetes: Secondary | ICD-10-CM | POA: Diagnosis not present

## 2017-11-29 DIAGNOSIS — I10 Essential (primary) hypertension: Secondary | ICD-10-CM | POA: Diagnosis not present

## 2017-11-29 DIAGNOSIS — O26891 Other specified pregnancy related conditions, first trimester: Secondary | ICD-10-CM | POA: Diagnosis not present

## 2017-11-29 DIAGNOSIS — Z3A01 Less than 8 weeks gestation of pregnancy: Secondary | ICD-10-CM | POA: Diagnosis not present

## 2017-11-29 DIAGNOSIS — L509 Urticaria, unspecified: Secondary | ICD-10-CM | POA: Diagnosis not present

## 2017-11-29 DIAGNOSIS — O3680X0 Pregnancy with inconclusive fetal viability, not applicable or unspecified: Secondary | ICD-10-CM | POA: Diagnosis not present

## 2017-11-29 DIAGNOSIS — J45909 Unspecified asthma, uncomplicated: Secondary | ICD-10-CM | POA: Diagnosis not present

## 2017-11-29 DIAGNOSIS — Z0000 Encounter for general adult medical examination without abnormal findings: Secondary | ICD-10-CM | POA: Diagnosis not present

## 2017-11-29 DIAGNOSIS — Z720 Tobacco use: Secondary | ICD-10-CM | POA: Diagnosis not present

## 2017-11-29 DIAGNOSIS — E559 Vitamin D deficiency, unspecified: Secondary | ICD-10-CM | POA: Diagnosis not present

## 2017-11-30 ENCOUNTER — Encounter (HOSPITAL_COMMUNITY): Payer: Self-pay | Admitting: *Deleted

## 2017-11-30 ENCOUNTER — Inpatient Hospital Stay (HOSPITAL_COMMUNITY)
Admission: AD | Admit: 2017-11-30 | Discharge: 2017-11-30 | Disposition: A | Discharge status: Home / Self Care | Payer: Medicare HMO | Source: Ambulatory Visit | Attending: Obstetrics and Gynecology | Admitting: Obstetrics and Gynecology

## 2017-11-30 ENCOUNTER — Other Ambulatory Visit: Payer: Self-pay | Admitting: Obstetrics and Gynecology

## 2017-11-30 DIAGNOSIS — L509 Urticaria, unspecified: Secondary | ICD-10-CM

## 2017-11-30 DIAGNOSIS — Z888 Allergy status to other drugs, medicaments and biological substances status: Secondary | ICD-10-CM | POA: Diagnosis not present

## 2017-11-30 DIAGNOSIS — O26891 Other specified pregnancy related conditions, first trimester: Secondary | ICD-10-CM | POA: Insufficient documentation

## 2017-11-30 DIAGNOSIS — Z3A08 8 weeks gestation of pregnancy: Secondary | ICD-10-CM | POA: Insufficient documentation

## 2017-11-30 DIAGNOSIS — I10 Essential (primary) hypertension: Secondary | ICD-10-CM

## 2017-11-30 DIAGNOSIS — Z87891 Personal history of nicotine dependence: Secondary | ICD-10-CM | POA: Diagnosis not present

## 2017-11-30 DIAGNOSIS — Z3490 Encounter for supervision of normal pregnancy, unspecified, unspecified trimester: Secondary | ICD-10-CM

## 2017-11-30 LAB — URINALYSIS, ROUTINE W REFLEX MICROSCOPIC
Bilirubin Urine: NEGATIVE
Glucose, UA: NEGATIVE mg/dL
Hgb urine dipstick: NEGATIVE
Ketones, ur: NEGATIVE mg/dL
Leukocytes, UA: NEGATIVE
Nitrite: NEGATIVE
Protein, ur: NEGATIVE mg/dL
Specific Gravity, Urine: 1.024 (ref 1.005–1.030)
pH: 5 (ref 5.0–8.0)

## 2017-11-30 LAB — RAPID URINE DRUG SCREEN, HOSP PERFORMED
Amphetamines: NOT DETECTED
Barbiturates: NOT DETECTED
Benzodiazepines: NOT DETECTED
Cocaine: NOT DETECTED
Opiates: NOT DETECTED
Tetrahydrocannabinol: NOT DETECTED

## 2017-11-30 MED ORDER — RANITIDINE HCL 300 MG PO TABS: 300.0000 mg | ORAL_TABLET | Freq: Every day | ORAL | 0 refills | Status: DC

## 2017-11-30 MED ORDER — PREDNISONE 10 MG PO TABS: 30.0000 mg | ORAL_TABLET | Freq: Every day | ORAL | 0 refills | Status: DC

## 2017-11-30 MED ORDER — PREDNISONE 10 MG PO TABS: 20.0000 mg | ORAL_TABLET | Freq: Every day | ORAL | 0 refills | Status: DC

## 2017-11-30 NOTE — Discharge Instructions (Signed)
Hives Hives (urticaria) are itchy, red, swollen areas on your skin. Hives can show up on any part of your body, and they can vary in size. They can be as small as the tip of a pen or much larger. Hives often fade within 24 hours (acute hives). In other cases, new hives show up after old ones fade. This can continue for many days or weeks (chronic hives). Hives are caused by your body's reaction to an irritant or to something that you are allergic to (trigger). You can get hives right after being around a trigger or hours later. Hives do not spread from person to person (are not contagious). Hives may get worse if you scratch them, if you exercise, or if you have worries (emotional stress). Follow these instructions at home: Medicines  Take or apply over-the-counter and prescription medicines only as told by your doctor.  If you were prescribed an antibiotic medicine, use it as told by your doctor. Do not stop taking the antibiotic even if you start to feel better. Skin Care  Apply cool, wet cloths (cool compresses) to the itchy, red, swollen areas.  Do not scratch your skin. Do not rub your skin. General instructions  Do not take hot showers or baths. This can make itching worse.  Do not wear tight clothes.  Use sunscreen and wear clothing that covers your skin when you are outside.  Avoid any triggers that cause your hives. Keep a journal to help you keep track of what causes your hives. Write down: ? What medicines you take. ? What you eat and drink. ? What products you use on your skin.  Keep all follow-up visits as told by your doctor. This is important. Contact a doctor if:  Your symptoms are not better with medicine.  Your joints are painful or swollen. Get help right away if:  You have a fever.  You have belly pain.  Your tongue or lips are swollen.  Your eyelids are swollen.  Your chest or throat feels tight.  You have trouble breathing or swallowing. These  symptoms may be an emergency. Do not wait to see if the symptoms will go away. Get medical help right away. Call your local emergency services (911 in the [place] Do not drive yourself to the hospital. This information is not intended to replace advice given to you by your health care provider. Make sure you discuss any questions you have with your health care provider. Document Released: 07/03/2008 Document Revised: 03/01/2016 Document Reviewed: 07/13/2015 Elsevier Interactive Patient Education  2018 [employer]  
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
## 2017-11-30 NOTE — MAU Note (Addendum)
Pt presents with c/o hives, reports hives initially started on 11/27/17.  Has been seen in [facility] ED x2 with same c/o hives, last visit 11/28/17.  Reports given Rx for Benadryl.  States last took Benadryl @ 1030, also took Zyrtec @ 1130.  Reports mild relief with meds. Pt also c/o of lower abdominal menstrual type cramping, reports nothing different in cramping since seen in ED 2 days ago.  Denies VB or spotting. Reports new onset of diarrhea, 2 episodes in 24 hours.

## 2017-12-05 DIAGNOSIS — Z3A08 8 weeks gestation of pregnancy: Secondary | ICD-10-CM | POA: Diagnosis not present

## 2017-12-05 DIAGNOSIS — Z3689 Encounter for other specified antenatal screening: Secondary | ICD-10-CM | POA: Diagnosis not present

## 2017-12-05 DIAGNOSIS — O10011 Pre-existing essential hypertension complicating pregnancy, first trimester: Secondary | ICD-10-CM | POA: Diagnosis not present

## 2017-12-05 DIAGNOSIS — Z113 Encounter for screening for infections with a predominantly sexual mode of transmission: Secondary | ICD-10-CM | POA: Diagnosis not present

## 2017-12-05 DIAGNOSIS — Z3682 Encounter for antenatal screening for nuchal translucency: Secondary | ICD-10-CM | POA: Diagnosis not present

## 2018-01-02 DIAGNOSIS — Z3A12 12 weeks gestation of pregnancy: Secondary | ICD-10-CM | POA: Diagnosis not present

## 2018-01-02 DIAGNOSIS — O10011 Pre-existing essential hypertension complicating pregnancy, first trimester: Secondary | ICD-10-CM | POA: Diagnosis not present

## 2018-01-07 ENCOUNTER — Telehealth: Payer: Self-pay | Admitting: Cardiology

## 2018-01-07 ENCOUNTER — Encounter (HOSPITAL_COMMUNITY): Payer: Self-pay

## 2018-01-07 ENCOUNTER — Other Ambulatory Visit: Payer: Self-pay

## 2018-01-07 ENCOUNTER — Emergency Department (HOSPITAL_COMMUNITY)
Admission: EM | Admit: 2018-01-07 | Discharge: 2018-01-07 | Disposition: A | Discharge status: Home / Self Care | Payer: Medicare HMO | Attending: Emergency Medicine | Admitting: Emergency Medicine

## 2018-01-07 DIAGNOSIS — Z79899 Other long term (current) drug therapy: Secondary | ICD-10-CM | POA: Insufficient documentation

## 2018-01-07 DIAGNOSIS — K2100 Gastro-esophageal reflux disease with esophagitis, without bleeding: Secondary | ICD-10-CM

## 2018-01-07 DIAGNOSIS — J45909 Unspecified asthma, uncomplicated: Secondary | ICD-10-CM | POA: Insufficient documentation

## 2018-01-07 DIAGNOSIS — R1013 Epigastric pain: Secondary | ICD-10-CM | POA: Diagnosis present

## 2018-01-07 DIAGNOSIS — Z87891 Personal history of nicotine dependence: Secondary | ICD-10-CM | POA: Diagnosis not present

## 2018-01-07 DIAGNOSIS — O99611 Diseases of the digestive system complicating pregnancy, first trimester: Secondary | ICD-10-CM | POA: Diagnosis not present

## 2018-01-07 DIAGNOSIS — F909 Attention-deficit hyperactivity disorder, unspecified type: Secondary | ICD-10-CM | POA: Insufficient documentation

## 2018-01-07 DIAGNOSIS — I10 Essential (primary) hypertension: Secondary | ICD-10-CM | POA: Insufficient documentation

## 2018-01-07 DIAGNOSIS — R072 Precordial pain: Secondary | ICD-10-CM | POA: Diagnosis not present

## 2018-01-07 DIAGNOSIS — O9989 Other specified diseases and conditions complicating pregnancy, childbirth and the puerperium: Secondary | ICD-10-CM | POA: Diagnosis not present

## 2018-01-07 LAB — CBC
HCT: 39.8 % (ref 36.0–46.0)
Hemoglobin: 13 g/dL (ref 12.0–15.0)
MCH: 28.7 pg (ref 26.0–34.0)
MCHC: 32.7 g/dL (ref 30.0–36.0)
MCV: 87.9 fL (ref 78.0–100.0)
Platelets: 290 10*3/uL (ref 150–400)
RBC: 4.53 MIL/uL (ref 3.87–5.11)
RDW: 13.9 % (ref 11.5–15.5)
WBC: 7.3 10*3/uL (ref 4.0–10.5)

## 2018-01-07 LAB — BASIC METABOLIC PANEL
Anion gap: 7 (ref 5–15)
BUN: 7 mg/dL (ref 6–20)
CO2: 22 mmol/L (ref 22–32)
Calcium: 9.6 mg/dL (ref 8.9–10.3)
Chloride: 105 mmol/L (ref 101–111)
Creatinine, Ser: 0.59 mg/dL (ref 0.44–1.00)
GFR calc Af Amer: >60 mL/min (ref >60)
GFR calc non Af Amer: >60 mL/min (ref >60)
Glucose, Bld: 100 mg/dL — ABNORMAL HIGH (ref 65–99)
Potassium: 3.8 mmol/L (ref 3.5–5.1)
Sodium: 134 mmol/L — ABNORMAL LOW (ref 135–145)

## 2018-01-07 LAB — I-STAT TROPONIN, ED: Troponin i, poc: 0.01 ng/mL (ref 0.00–0.08)

## 2018-01-07 LAB — BRAIN NATRIURETIC PEPTIDE: B Natriuretic Peptide: 29.8 pg/mL (ref 0.0–100.0)

## 2018-01-07 MED ORDER — RANITIDINE HCL 150 MG PO TABS: 150.0000 mg | ORAL_TABLET | Freq: Two times a day (BID) | ORAL | 0 refills | Status: DC

## 2018-01-07 MED ORDER — FAMOTIDINE IN NACL 20-0.9 MG/50ML-% IV SOLN
20.0000 mg | Freq: Once | INTRAVENOUS | Status: AC
  Administered 2018-01-07: 20 mg via INTRAVENOUS
  Filled 2018-01-07: qty 50

## 2018-01-07 NOTE — Telephone Encounter (Signed)
Follow up    Patient update phone number

## 2018-01-07 NOTE — Discharge Instructions (Addendum)
Follow up with your md in 1-2 weeks for recheck °

## 2018-01-07 NOTE — Telephone Encounter (Signed)
Pt is calling to inform Dr Meda Coffee that she is having acute chest pain, sob, and a fluttering sensation in her chest.  Pt is [redacted] weeks pregnant.  Pt reports that this pain is mid-sternal in nature.  Pt reports she is sob with this pain.  Pt reports that she has no dizziness, pre-syncopal or syncopal episodes.  Pt reports that her OBGYN advised her to go to the ER at [facility] for further evaluation. Pt reports she is taking her labetalol as prescribed.  Pt states she is going to have her Husband drive her to [facility] ER now for further evaluation of complaints.  Informed the pt that I agree with her OBGYN that she should report to the ER now.  Informed the pt that Dr Meda Coffee is out of the office today, but I will route this message to her for further review.  Pt verbalized understanding and agrees with this plan.

## 2018-01-07 NOTE — ED Notes (Signed)
ED Provider at bedside. 
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
## 2018-01-07 NOTE — ED Triage Notes (Signed)
Pt reports she has hx. Of SOB, feeling more SOB than normal. C/O fluid build, Cp, having blood when having a BM. Pt also reports she had preeclampsia with last pregnancy and HF. Pt states she is 14 weeks.

## 2018-01-07 NOTE — Telephone Encounter (Signed)
New Message    Pt c/o Shortness Of Breath: STAT if SOB developed within the last 24 hours or pt is noticeably SOB on the phone  1. Are you currently SOB (can you hear that pt is SOB on the phone)? no  2. How long have you been experiencing SOB?  A week  3. Are you SOB when sitting or when up moving around? Both   4. Are you currently experiencing any other symptoms? A flutter feeling and pain in the middle of chest when she moved

## 2018-01-08 ENCOUNTER — Other Ambulatory Visit: Payer: Self-pay | Admitting: Cardiology

## 2018-01-08 DIAGNOSIS — I10 Essential (primary) hypertension: Secondary | ICD-10-CM

## 2018-01-08 DIAGNOSIS — E8779 Other fluid overload: Secondary | ICD-10-CM

## 2018-01-08 DIAGNOSIS — R0602 Shortness of breath: Secondary | ICD-10-CM

## 2018-01-08 DIAGNOSIS — I5031 Acute diastolic (congestive) heart failure: Secondary | ICD-10-CM

## 2018-01-08 NOTE — Telephone Encounter (Signed)
Please advise on medication request. Medication LASIX is not currently on med list.

## 2018-01-09 ENCOUNTER — Telehealth: Payer: Self-pay | Admitting: Cardiology

## 2018-01-09 NOTE — Telephone Encounter (Signed)
° °  [facility] Pre-operative Risk Assessment    Request for surgical clearance:  1. What type of surgery is being performed?Surgical Abortion ( 13 1/2 weeks )   2. When is this surgery scheduled?Pending  3. What type of clearance is required (medical clearance vs. Pharmacy clearance to hold med vs. Both)? Both   Are there any medications that need to be held prior to surgery and how long?  4. Practice name and name of physician performing surgery? Dr. Tyrone Nine   What is your office phone and fax number? [phone]  Anesthesia type (None, local, MAC, general) ? 60m Versed or Sentanyl 100 Mcg   BRomana Juniper4/01/2018, 9:28 AM  _________________________________________________________________   (provider comments below)

## 2018-01-10 NOTE — Telephone Encounter (Signed)
   [name] is a patient of [name].  She was seen by Sharrell Ku, PA-C on 11/26/2017.  Clearance for this procedure was not addressed.  I attempted to call her for preop evaluation, but did not get her.  I left a message requesting that she call back on Monday between 1:30 and 5.  Rosaria Ferries, PA-C 01/10/2018 5:09 PM Beeper [phone]

## 2018-01-13 ENCOUNTER — Telehealth: Payer: Self-pay | Admitting: Cardiology

## 2018-01-13 ENCOUNTER — Telehealth: Payer: Self-pay | Admitting: Adult Health

## 2018-01-13 NOTE — Telephone Encounter (Signed)
   Primary Cardiologist: Ena Dawley, MD  Chart reviewed as part of pre-operative protocol coverage. Patient was contacted 01/13/2018 in reference to pre-operative risk assessment for pending surgery as outlined below.  Lyza L Angelo was last seen on  by [name] on 11/26/2017  Since that day, Millette L Scheidegger has done okay. BP is better controlled.   Therefore, based on ACC/AHA guidelines, the patient would be at acceptable risk for the planned procedure without further cardiovascular testing.   I will route this recommendation to the requesting party via Epic fax function and remove from pre-op pool.  Please call with questions.  Jory Sims DNP, ANP, AACC  01/13/2018, 2:44 PM     Request for surgical clearance:  1. What type of surgery is being performed?Surgical Abortion ( 13 1/2 weeks )   2. When is this surgery scheduled?Pending  3. What type of clearance is required (medical clearance vs. Pharmacy clearance to hold med vs. Both)? Both   Are there any medications that need to be held prior to surgery and how long?  4. Practice name and name of physician performing surgery? Dr. Tyrone Nine   What is your office phone and fax number? [phone]  Anesthesia type (None, local, MAC, general) ? 5m Versed or Sentanyl 100 Mcg   BRomana Juniper4/01/2018, 9:28 AM  _________________________________________________________________   (provider comments below)

## 2018-01-13 NOTE — Telephone Encounter (Signed)
[name] is returning a call . Thanks

## 2018-01-14 NOTE — Telephone Encounter (Signed)
   Primary Cardiologist: Ena Dawley, MD  Chart reviewed as part of pre-operative protocol coverage. Patient was contacted 01/14/2018 in reference to pre-operative risk assessment for pending surgery as outlined below.  [name] was last seen on 11/26/17 by [name], PA.  Since that day, [name] has done well. She has hx of volume overload with in the peripartum period, but Echo showed normal heart function. Care will need to be taken to control her BP and avoid fluid overload with this procedure.   Therefore, based on ACC/AHA guidelines, the patient would be at acceptable risk for the planned procedure without further cardiovascular testing.   I will route this recommendation to the requesting party via Epic fax function and remove from pre-op pool.  Please call with questions.  Daune Perch, NP 01/14/2018, 1:57 PM

## 2018-01-14 NOTE — Telephone Encounter (Signed)
I called the office to get fax number: [phone]

## 2018-01-16 ENCOUNTER — Telehealth: Payer: Self-pay | Admitting: Physician Assistant

## 2018-01-16 NOTE — Telephone Encounter (Signed)
New Message   Patient is calling to request a letter that indicates that its medically necessary for her to have an abortion due to her heart condition. Please call to discuss.

## 2018-01-16 NOTE — Telephone Encounter (Signed)
Will forward this request to Dr Meda Coffee, for further recommendation.  Pt will get follow-up when further advise provided.

## 2018-01-17 NOTE — Telephone Encounter (Signed)
[name], please refer to surgical clearance pre-op is working on for this pt, started on 01/13/18.  This will help for reference reasons.

## 2018-01-20 ENCOUNTER — Encounter: Payer: Self-pay | Admitting: *Deleted

## 2018-01-20 NOTE — Telephone Encounter (Signed)
Pt states that a letter is no longer needed, that's this was already addressed.  Did reiterate to the pt that per Dr Meda Coffee,   There is no indication from cardiac standpoint for an abortion, she should stop taking drugs and consider birth control such as Mirena in the future.       Pt verbalized understanding.

## 2018-01-20 NOTE — Telephone Encounter (Signed)
There is no indication from cardiac standpoint for an abortion, she should stop taking drugs and consider birth control such as Mirena in the future.

## 2018-01-27 DIAGNOSIS — I509 Heart failure, unspecified: Secondary | ICD-10-CM | POA: Diagnosis not present

## 2018-01-27 DIAGNOSIS — Z332 Encounter for elective termination of pregnancy: Secondary | ICD-10-CM | POA: Diagnosis not present

## 2018-01-27 DIAGNOSIS — I429 Cardiomyopathy, unspecified: Secondary | ICD-10-CM | POA: Diagnosis not present

## 2018-01-27 DIAGNOSIS — Z9049 Acquired absence of other specified parts of digestive tract: Secondary | ICD-10-CM | POA: Diagnosis not present

## 2018-01-27 DIAGNOSIS — F319 Bipolar disorder, unspecified: Secondary | ICD-10-CM | POA: Diagnosis not present

## 2018-01-28 DIAGNOSIS — Z332 Encounter for elective termination of pregnancy: Secondary | ICD-10-CM | POA: Diagnosis not present

## 2018-01-28 DIAGNOSIS — Z30430 Encounter for insertion of intrauterine contraceptive device: Secondary | ICD-10-CM | POA: Diagnosis not present

## 2018-01-30 ENCOUNTER — Encounter: Payer: Self-pay | Admitting: Cardiology

## 2018-01-31 IMAGING — DX DG CHEST 2V
2 series · 2 of 2 positions shown · non-contrast
Comparison: Chest CT 06/17/2017 and chest x-ray 06/17/2017

CLINICAL DATA: Chest pain and lightheaded. Elevated blood pressure.

EXAM:
CHEST  2 VIEW

[chest pa]
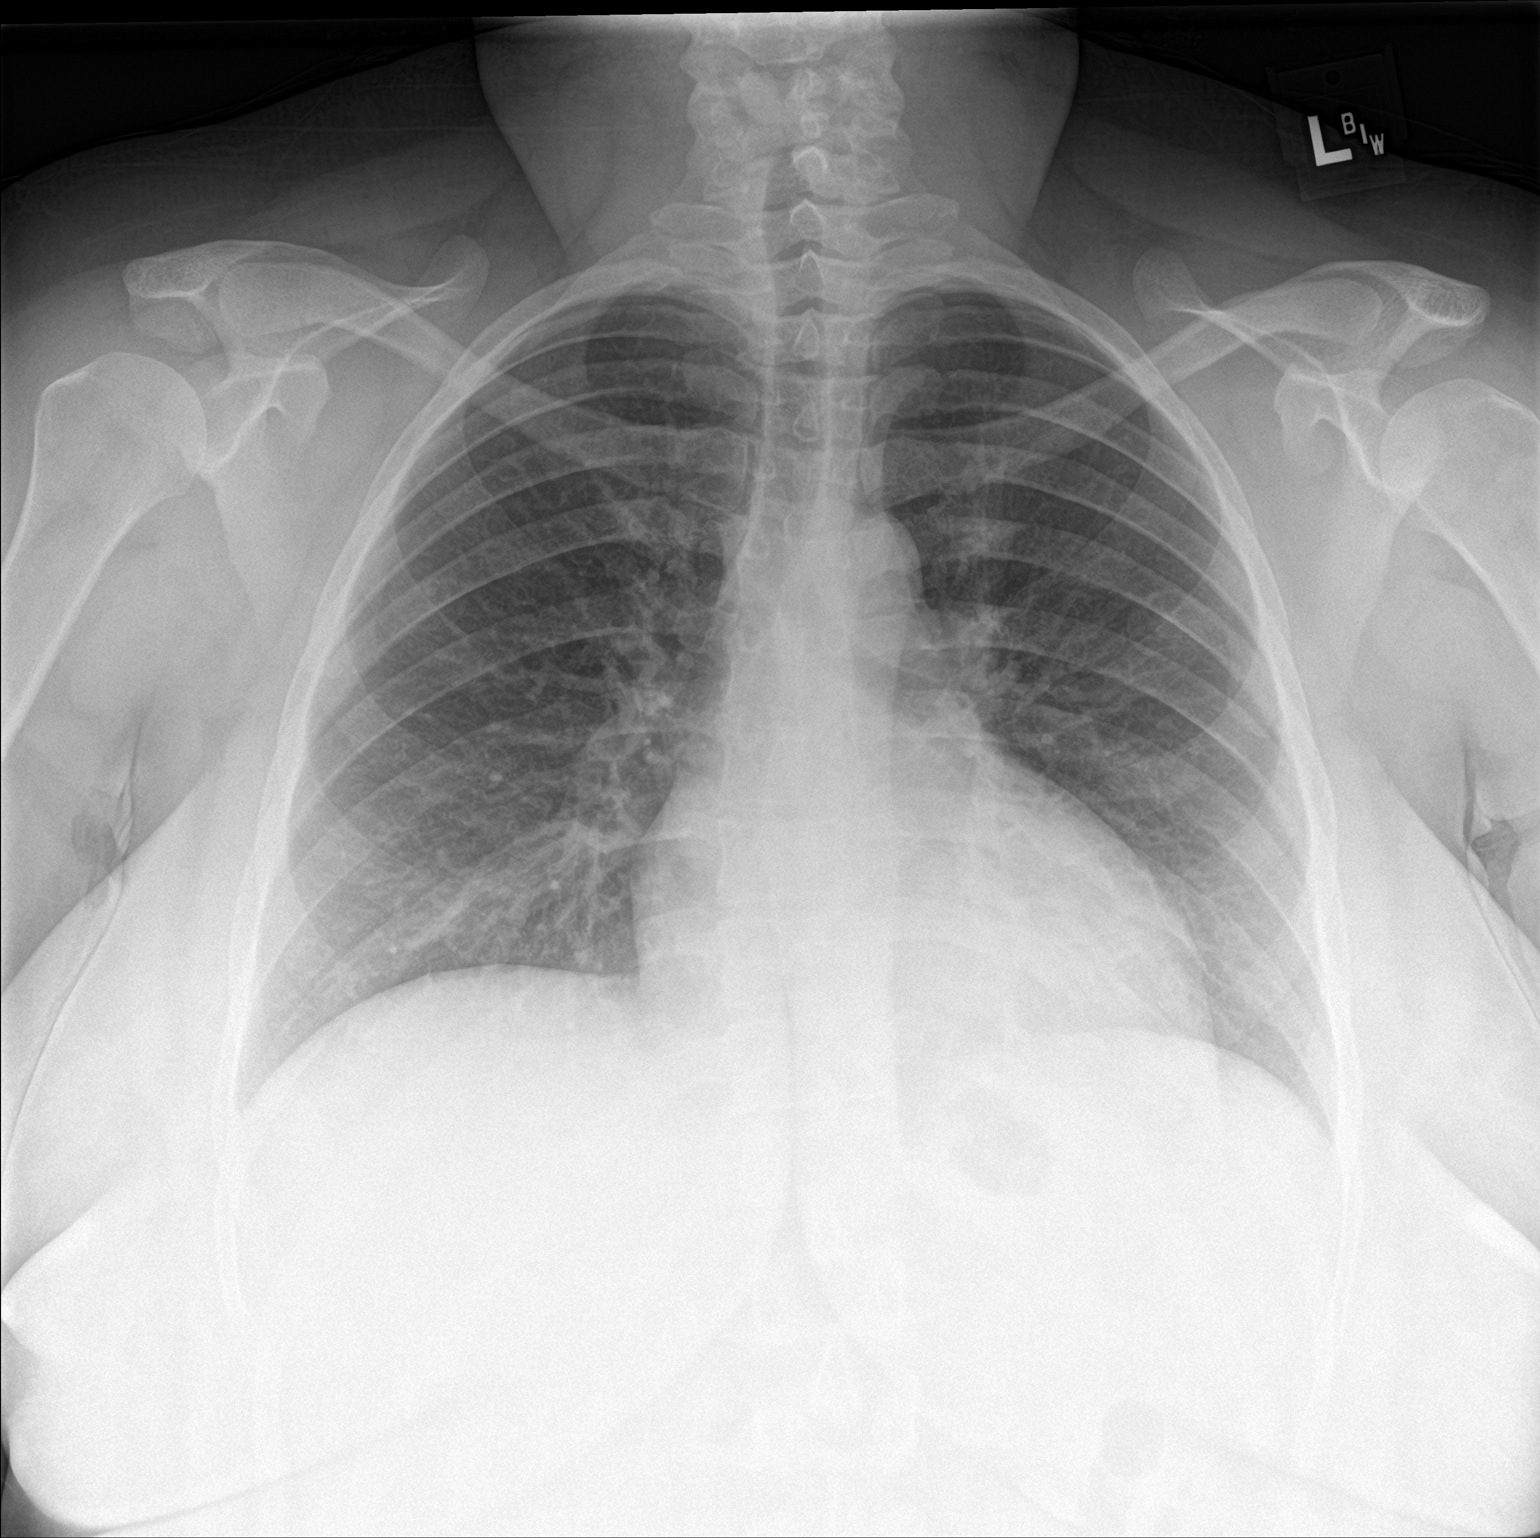

[chest lat]
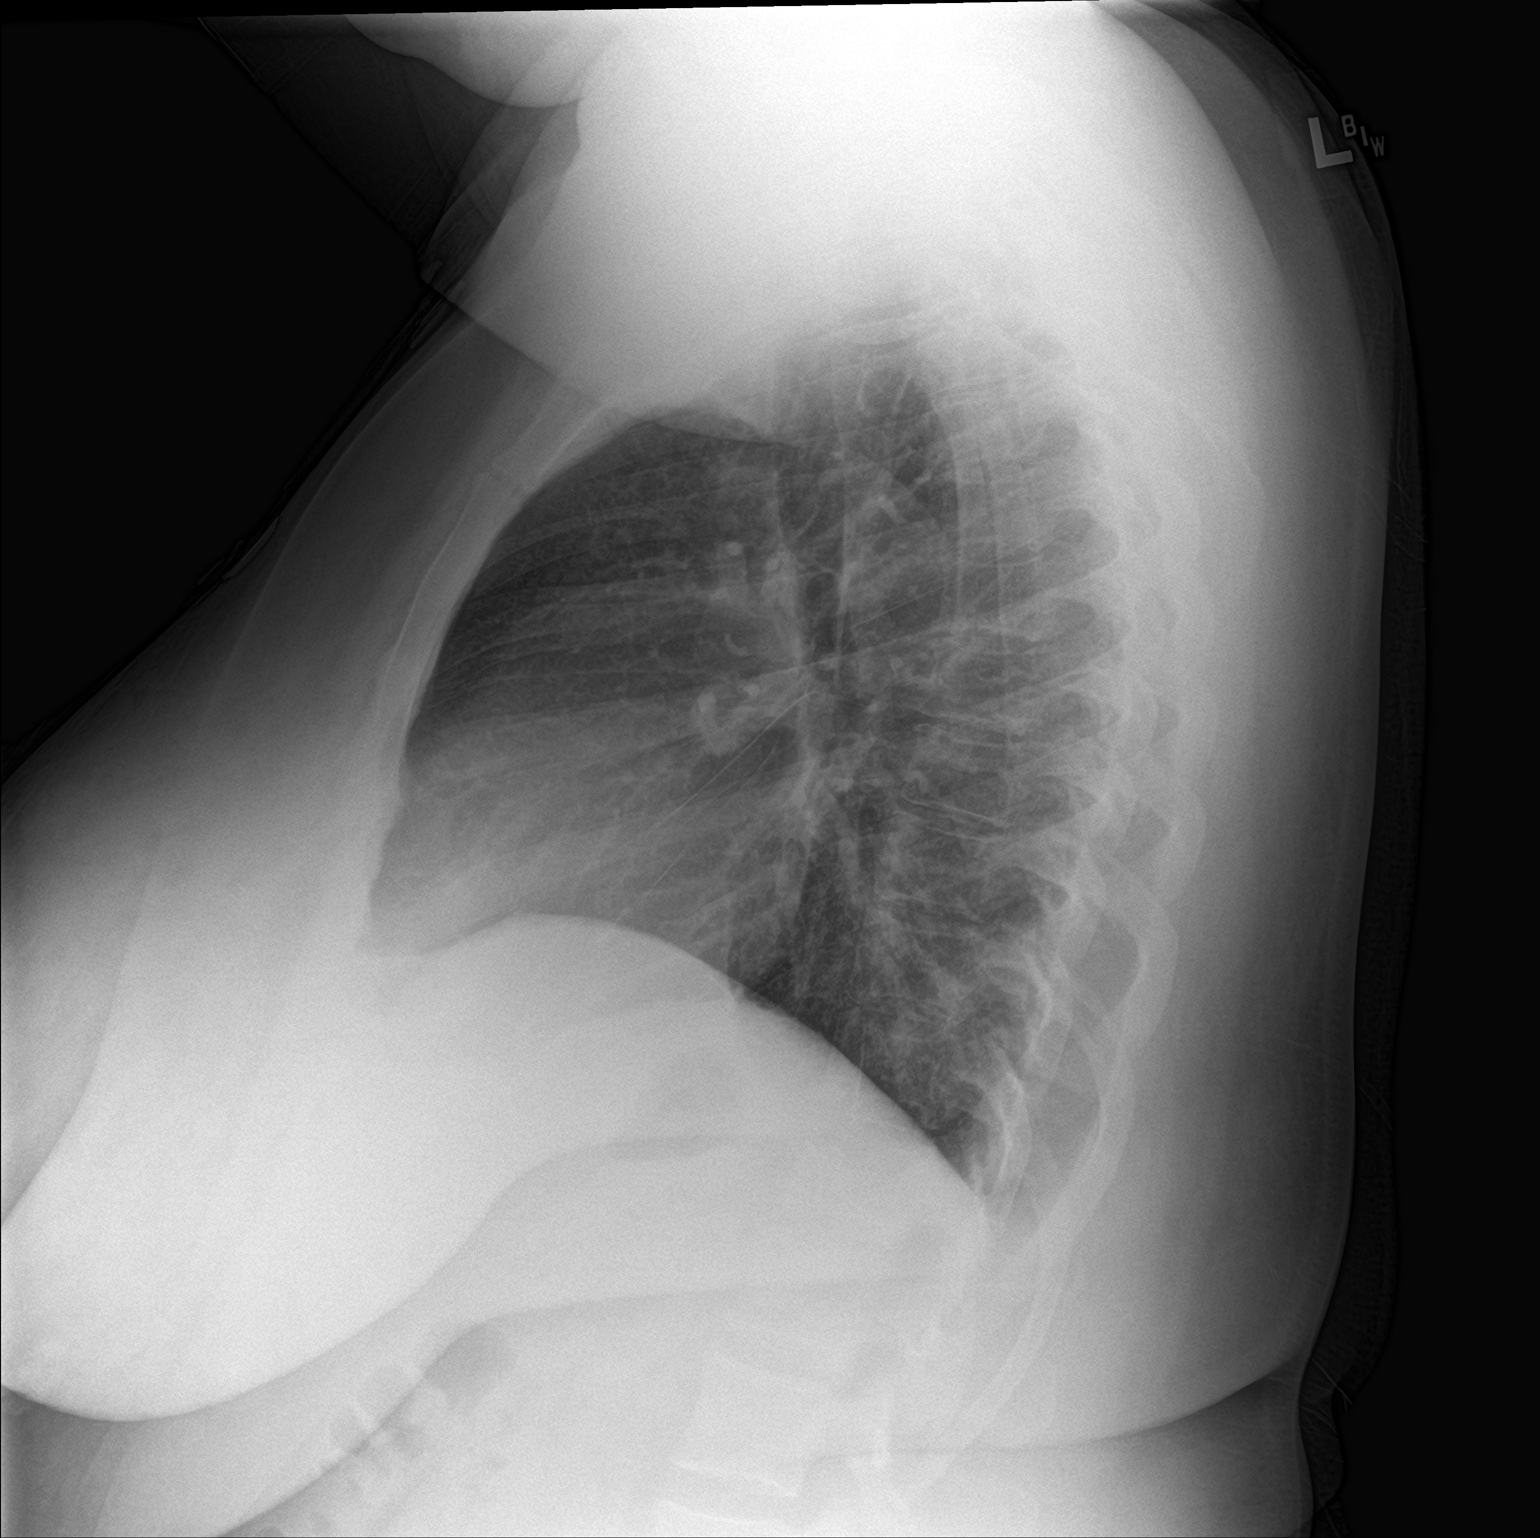

[2 of 2 positions shown; findings below may reference images not displayed]

FINDINGS: The cardiac silhouette, mediastinal and hilar contours are normal.
The lungs are clear. No pulmonary edema or pleural effusion. The
bony thorax is intact.
IMPRESSION: No acute cardiopulmonary findings.

## 2018-02-01 ENCOUNTER — Encounter (HOSPITAL_COMMUNITY): Payer: Self-pay

## 2018-02-01 ENCOUNTER — Ambulatory Visit (HOSPITAL_COMMUNITY)
Admission: EM | Admit: 2018-02-01 | Discharge: 2018-02-01 | Disposition: A | Discharge status: Home / Self Care | Payer: Medicare HMO | Attending: Family Medicine | Admitting: Family Medicine

## 2018-02-01 DIAGNOSIS — Z87891 Personal history of nicotine dependence: Secondary | ICD-10-CM | POA: Insufficient documentation

## 2018-02-01 DIAGNOSIS — Z79899 Other long term (current) drug therapy: Secondary | ICD-10-CM | POA: Insufficient documentation

## 2018-02-01 DIAGNOSIS — N939 Abnormal uterine and vaginal bleeding, unspecified: Secondary | ICD-10-CM | POA: Insufficient documentation

## 2018-02-01 DIAGNOSIS — G4733 Obstructive sleep apnea (adult) (pediatric): Secondary | ICD-10-CM | POA: Diagnosis not present

## 2018-02-01 DIAGNOSIS — I509 Heart failure, unspecified: Secondary | ICD-10-CM | POA: Diagnosis not present

## 2018-02-01 DIAGNOSIS — K0381 Cracked tooth: Secondary | ICD-10-CM | POA: Diagnosis not present

## 2018-02-01 DIAGNOSIS — K0889 Other specified disorders of teeth and supporting structures: Secondary | ICD-10-CM | POA: Insufficient documentation

## 2018-02-01 DIAGNOSIS — S0993XA Unspecified injury of face, initial encounter: Secondary | ICD-10-CM | POA: Diagnosis present

## 2018-02-01 DIAGNOSIS — Z888 Allergy status to other drugs, medicaments and biological substances status: Secondary | ICD-10-CM | POA: Insufficient documentation

## 2018-02-01 DIAGNOSIS — F319 Bipolar disorder, unspecified: Secondary | ICD-10-CM | POA: Insufficient documentation

## 2018-02-01 DIAGNOSIS — I110 Hypertensive heart disease with heart failure: Secondary | ICD-10-CM | POA: Insufficient documentation

## 2018-02-01 MED ORDER — FLUCONAZOLE 150 MG PO TABS: 150.0000 mg | ORAL_TABLET | Freq: Every day | ORAL | 0 refills | Status: DC

## 2018-02-01 MED ORDER — MELOXICAM 7.5 MG PO TABS: 7.5000 mg | ORAL_TABLET | Freq: Every day | ORAL | 0 refills | Status: DC

## 2018-02-01 MED ORDER — CHLORHEXIDINE GLUCONATE 0.12 % MT SOLN: 15.0000 mL | Freq: Two times a day (BID) | OROMUCOSAL | 0 refills | Status: DC

## 2018-02-01 MED ORDER — HYDROCODONE-ACETAMINOPHEN 5-325 MG PO TABS: 1.0000 | ORAL_TABLET | Freq: Four times a day (QID) | ORAL | 0 refills | Status: DC | PRN

## 2018-02-01 MED ORDER — METRONIDAZOLE 500 MG PO TABS: 500.0000 mg | ORAL_TABLET | Freq: Two times a day (BID) | ORAL | 0 refills | Status: DC

## 2018-02-01 NOTE — Discharge Instructions (Signed)
Start chlorhexidine for dental hygiene. Mobic and oragel for pain. Norco for breakthrough pain. Follow up with dentist for further treatment and evaluation. If experiencing swelling of the throat, trouble breathing, trouble swallowing, leaning forward to breath, drooling, go to the emergency department for further evaluation.   You were treated empirically for BV and yeast. Start flagyl and diflucan as directed. Cytology sent, you will be contacted with any positive results that requires further treatment. Refrain from sexual activity and alcohol use for the next 7 days. Monitor for any worsening of symptoms, fever, abdominal pain, nausea, vomiting, to follow up for reevaluation. Please follow up with GYN for further evaluation and management of vaginal bleeding.

## 2018-02-01 NOTE — ED Triage Notes (Signed)
Pt reports dental pain where a tooth broke and increased vaginal bleeding and odor.
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
## 2018-02-03 DIAGNOSIS — R102 Pelvic and perineal pain: Secondary | ICD-10-CM | POA: Diagnosis not present

## 2018-02-03 DIAGNOSIS — O034 Incomplete spontaneous abortion without complication: Secondary | ICD-10-CM | POA: Diagnosis not present

## 2018-02-03 LAB — CERVICOVAGINAL ANCILLARY ONLY
Bacterial vaginitis: NEGATIVE
Candida vaginitis: NEGATIVE
Chlamydia: NEGATIVE
Neisseria Gonorrhea: NEGATIVE
Trichomonas: NEGATIVE

## 2018-02-03 NOTE — Progress Notes (Signed)
Attempted to reach patient regarding normal lab results, pt is not available encouraged family to have patient call back.

## 2018-02-13 DIAGNOSIS — D251 Intramural leiomyoma of uterus: Secondary | ICD-10-CM | POA: Diagnosis not present

## 2018-02-17 ENCOUNTER — Encounter: Payer: Self-pay | Admitting: Cardiology

## 2018-02-17 ENCOUNTER — Ambulatory Visit (INDEPENDENT_AMBULATORY_CARE_PROVIDER_SITE_OTHER): Payer: Medicare HMO | Admitting: Cardiology

## 2018-02-17 VITALS — BP 140/90 | HR 72 | Ht 65.0 in | Wt 291.6 lb

## 2018-02-17 DIAGNOSIS — I10 Essential (primary) hypertension: Secondary | ICD-10-CM | POA: Diagnosis not present

## 2018-02-17 DIAGNOSIS — J45909 Unspecified asthma, uncomplicated: Secondary | ICD-10-CM | POA: Diagnosis not present

## 2018-02-17 DIAGNOSIS — R600 Localized edema: Secondary | ICD-10-CM | POA: Diagnosis not present

## 2018-02-17 MED ORDER — AMLODIPINE BESYLATE 5 MG PO TABS: 5.0000 mg | ORAL_TABLET | Freq: Every day | ORAL | 2 refills | Status: DC

## 2018-02-17 MED ORDER — HYDROCHLOROTHIAZIDE 25 MG PO TABS: 25.0000 mg | ORAL_TABLET | Freq: Every day | ORAL | 2 refills | Status: DC

## 2018-02-17 NOTE — Patient Instructions (Signed)
Medication Instructions:   STOP TAKING LABETALOL NOW  START TAKING AMLODIPINE 5 MG ONCE DAILY  START TAKING HYDROCHLOROTHIAZIDE 25 MG ONCE DAILY     Follow-Up:  2 WEEKS IN [facility] TO SEE MEGAN SUPPLE PHARMD OR KELLEY AUTEN PHARMD   4 MONTHS WITH DR Meda Coffee       If you need a refill on your cardiac medications before your next appointment, please call your pharmacy.
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
## 2018-02-20 ENCOUNTER — Telehealth: Payer: Self-pay | Admitting: Cardiology

## 2018-02-20 NOTE — Telephone Encounter (Signed)
I reviewed with Fuller Canada, PharmD and pt can take amlodipine 5 mg now due to elevated BP.  Further recommendations to be made by [name].  I spoke with pt and advised her to take amlodipine 5 mg now.  I told her we would call her back once reviewed by Dr Meda Coffee.

## 2018-02-20 NOTE — Telephone Encounter (Signed)
New message    Pt c/o medication issue:  1. Name of Medication: hydrochlorothiazide (HYDRODIURIL) 25 MG tablet  2. How are you currently taking this medication (dosage and times per day)? Take 1 tablet (25 mg total) by mouth daily.  3. Are you having a reaction (difficulty breathing--STAT)? No  4. What is your medication issue? Causing stomach cramps, patient requesting to take Propranolol

## 2018-02-20 NOTE — Telephone Encounter (Signed)
I spoke with pt. She reports since starting HCTZ on 5/13 she has had stomach cramping. She has to sit down due to cramps and sometimes go to the bathroom.  Denies frequent bowel movements or diarrhea. Has never taken HCTZ before.  Amlodipine also added at recent visit. Pt has taken amlodipine in the past and tolerated OK.   She also complains that heart does a "double beat" at times.  Happened 3 times today when she was walking in the store.  She states she took propanolol, amlodipine and lasix in the past and is asking if she can go back on this since she is not pregnant. I asked if she has been checking her blood pressure and she has not checked since she saw [name].  She checked while on the phone with me and reports BP is 146/104 and heart rate is 105.

## 2018-02-21 MED ORDER — PROPRANOLOL HCL 20 MG PO TABS: 20.0000 mg | ORAL_TABLET | Freq: Two times a day (BID) | ORAL | 2 refills | Status: DC

## 2018-02-21 NOTE — Telephone Encounter (Signed)
Yes, please d/c HCTZ, restart propranolol 20 mg PO BID, continue amlodipine 5 mg po daily.

## 2018-02-21 NOTE — Telephone Encounter (Signed)
[name], pt is asking if she can go off the HCTZ due to stomach cramps, continue her lasix 40 mg daily, take amlodipine 5 mg daily, and she would like to go back to her old regimen of propanolol, being she isn't pregnant now.   Please advise on med regimen.  She is already taking amlodipine 5 mg po daily, as started at last OV with the pt Monday.

## 2018-02-21 NOTE — Telephone Encounter (Signed)
I agree with increasing amlodipine.  

## 2018-02-21 NOTE — Telephone Encounter (Signed)
Spoke with the pt and informed her that per Dr Meda Coffee, we will stop her HCTZ, restart her Propranolol 20 mg po bid, and she should continue her lasix 40 mg po daily regimen, as well as her amlodipine 5 mg po daily.  Confirmed the pharmacy of choice with the pt.  Pt verbalized understanding and agrees with this plan.   Updated HCTZ as an intolerance in her allergies, in her chart--pt inquired that this causes her stomach cramps.

## 2018-03-04 DIAGNOSIS — E559 Vitamin D deficiency, unspecified: Secondary | ICD-10-CM | POA: Diagnosis not present

## 2018-03-04 DIAGNOSIS — Z720 Tobacco use: Secondary | ICD-10-CM | POA: Diagnosis not present

## 2018-03-04 DIAGNOSIS — I10 Essential (primary) hypertension: Secondary | ICD-10-CM | POA: Diagnosis not present

## 2018-03-04 DIAGNOSIS — J45909 Unspecified asthma, uncomplicated: Secondary | ICD-10-CM | POA: Diagnosis not present

## 2018-03-04 DIAGNOSIS — R7303 Prediabetes: Secondary | ICD-10-CM | POA: Diagnosis not present

## 2018-03-04 DIAGNOSIS — M6740 Ganglion, unspecified site: Secondary | ICD-10-CM | POA: Diagnosis not present

## 2018-03-04 DIAGNOSIS — R1013 Epigastric pain: Secondary | ICD-10-CM | POA: Diagnosis not present

## 2018-03-04 DIAGNOSIS — E785 Hyperlipidemia, unspecified: Secondary | ICD-10-CM | POA: Diagnosis not present

## 2018-03-07 DIAGNOSIS — F4321 Adjustment disorder with depressed mood: Secondary | ICD-10-CM | POA: Diagnosis not present

## 2018-03-08 HISTORY — PX: COLONOSCOPY WITH PROPOFOL: SHX5780

## 2018-03-12 ENCOUNTER — Ambulatory Visit: Payer: Medicare HMO

## 2018-03-12 ENCOUNTER — Ambulatory Visit (INDEPENDENT_AMBULATORY_CARE_PROVIDER_SITE_OTHER): Payer: Medicare HMO | Admitting: Pharmacist

## 2018-03-12 VITALS — BP 112/82 | HR 63

## 2018-03-12 DIAGNOSIS — F4321 Adjustment disorder with depressed mood: Secondary | ICD-10-CM | POA: Diagnosis not present

## 2018-03-12 DIAGNOSIS — I10 Essential (primary) hypertension: Secondary | ICD-10-CM | POA: Diagnosis not present

## 2018-03-12 NOTE — Progress Notes (Signed)
Patient ID: [name]                 DOB: Apr 05, 1986                      MRN: [id number]     HPI: [name] is a 32 y.o. female patient of [name] who presents today for hypertension evaluation. PMH significant for obesity, HTN, OSA, Asthma, Bioplar 1 disorder, depression, former tobacco abuse, and prior alcohol/cannibis use. Her HTN was previously controlled on amlodipine 5mg . However, she recently called to clinic to report that she was pregnant. Her amlodipine was stopped and she was started on labetalol 200mg  BID for control while pregnant. Since this time the pregnancy has been terminated and amlodipine and HCTZ were restarted. Since this time she has called and reported that HCTZ was causing cramping and this was stopped. She was restarted on propranolol 20mg  BID.   She presents today.  She is using OTC herbal tea. She reports she feels so much better on the propanolol. She does occasionally get heartburn pains. She denies headaches. She has had 2 pregnancies since I have last seen her in clinic. Unfortunately one of the pregnancies had to be terminated due to her cardiac conditions. This was recommended by her OB/Gyn   Current HTN meds:  Amlodipine 5mg  daily Furosemide 40mg  daily Propranolol 20mg  BID  Previously tried: HCTZ - cramps  BP goal: <130/80  Family History: Asthma in her father and sister; Breast cancer in her maternal grandmother; CAD in her other; Cancer in her maternal grandfather, maternal grandmother, and other; Diabetes in her mother; Diverticulitis in her father and mother; Heart disease in her paternal grandfather and paternal grandmother; Hypertension in her paternal aunt and sister; Stomach cancer in her paternal grandfather.  Social History: Quit tobacco in 2014. Last alcoholic in 9675 as well.   Diet: She is mindful about sodium intake. She does eat fast food occasionally. She mostly eats from home. She drinks mostly water and beet juice. She does  drink carrot juice as well.   Exercise: Walk and climbs stairs in the home daily.  Home BP readings: has not been monitoring  Wt Readings from Last 3 Encounters:  02/17/18 291 lb 9.6 oz (132.3 kg)  02/01/18 288 lb (130.6 kg)  01/07/18 288 lb (130.6 kg)   BP Readings from Last 3 Encounters:  02/17/18 140/90  02/01/18 (!) 134/95  01/07/18 105/64   Pulse Readings from Last 3 Encounters:  02/17/18 72  02/01/18 (!) 103  01/07/18 76    Renal function: CrCl cannot be calculated (Patient's most recent lab result is older than the maximum 21 days allowed.).  Past Medical History:  Diagnosis Date  . Anxiety   . Asthma   . Bipolar 1 disorder ([place])   . Chronic cholecystitis with calculus   . Depression   . Family history of adverse reaction to anesthesia    3rd cousin died during laser eye surgery age 43  . Headache    h/o migraines none recent  . History of acute heart failure    a. 06-17-2017 post partum secondary to continuous IV fluid administration during delivery - echo normal - required Lasix for diuresis.    . Hypertension   . Mild obstructive sleep apnea 01/28/2013 study   no cpap recommeded ;  recommended do not sleep supine, wt loss, sleep apnea surgery  during pregnacy    Current Outpatient Medications on File Prior to  Visit  Medication Sig Dispense Refill  . acetaminophen (TYLENOL) 500 MG tablet Take 500 mg by mouth every 8 (eight) hours as needed for headache.    . albuterol (PROVENTIL HFA;VENTOLIN HFA) 108 (90 BASE) MCG/ACT inhaler Inhale 2 puffs into the lungs every 6 (six) hours as needed for wheezing or shortness of breath.    Marland Kitchen amLODipine (NORVASC) 5 MG tablet Take 1 tablet (5 mg total) by mouth daily. 90 tablet 2  . chlorhexidine (PERIDEX) 0.12 % solution Use as directed 15 mLs in the mouth or throat 2 (two) times daily. 120 mL 0  . diphenhydrAMINE (BENADRYL) 25 MG tablet Take 1 tablet (25 mg total) by mouth every 6 (six) hours. 20 tablet 0  .  Fluticasone-Salmeterol (ADVAIR DISKUS) 100-50 MCG/DOSE AEPB Inhale 1 puff into the lungs 2 (two) times daily.    . furosemide (LASIX) 40 MG tablet TAKE 1 TABLET(40 MG) BY MOUTH DAILY 90 tablet 0  . HYDROcodone-acetaminophen (NORCO/VICODIN) 5-325 MG tablet Take 1 tablet by mouth every 6 (six) hours as needed for severe pain. 10 tablet 0  . levonorgestrel (MIRENA, 52 MG,) 20 MCG/24HR IUD Mirena 20 mcg/24 hours (5 yrs) 52 mg intrauterine device  Take by intrauterine route.    . propranolol (INDERAL) 20 MG tablet Take 1 tablet (20 mg total) by mouth 2 (two) times daily. 180 tablet 2   No current facility-administered medications on file prior to visit.     Allergies  Allergen Reactions  . Haldol [Haloperidol] Shortness Of Breath, Palpitations and Rash    "difficulty breathing"  . Depakote [Divalproex Sodium] Hives and Swelling  . Hctz [Hydrochlorothiazide] Nausea Only    Pt reports causes "stomach cramps."  . Nifedipine     06/2017 admission - felt absolutely awful after even 1 dose  . Pineapple Swelling  . Strawberry Extract Swelling  . Divalproex Sodium Hives and Rash    Last menstrual period 10/01/2017, not currently breastfeeding.   Assessment/Plan: Hypertension: BP today is borderline at goal. Will continue medications as prescribed. She has been concerned about fluid retention. Have advised that she start monitoring her weights and call our office if weight increases 3 lbs in a day or 5 lbs in a week. Follow up with [name] as scheduled and HTN clinic if needed.    Thank you, Lelan Pons. Patterson Hammersmith, [facility]  03/12/2018 9:51 AM

## 2018-03-12 NOTE — Progress Notes (Deleted)
Patient ID: KIARRA [name]                 DOB: Jan 30, 1986                      MRN: [id number]     HPI: [name] is a 32 y.o. female patient of [name] who presents today for hypertension evaluation. PMH significant for obesity, HTN, OSA, Asthma, Bioplar 1 disorder, depression, former tobacco abuse, and prior alcohol/cannibis use. Her HTN was previously controlled on amlodipine 5mg . However, she recently called to clinic to report that she was pregnant. Her amlodipine was stopped and she was started on labetalol 200mg  BID for control while pregnant. Since this time the pregnancy has been terminated and amlodipine and HCTZ were restarted. Since this time she has called and reported that HCTZ was causing cramping and this was stopped. She was restarted on propranolol 20mg  BID  She presents today     Current HTN meds:  Amlodipine 5mg  daily Furosemide 40mg  daily Propranolol 20mg  BID  Previously tried: HCTZ - cramps  BP goal: <130/80  Family History: Asthma in her father and sister; Breast cancer in her maternal grandmother; CAD in her other; Cancer in her maternal grandfather, maternal grandmother, and other; Diabetes in her mother; Diverticulitis in her father and mother; Heart disease in her paternal grandfather and paternal grandmother; Hypertension in her paternal aunt and sister; Stomach cancer in her paternal grandfather.  Social History: Quit tobacco in 2014. Last alcoholic in 9767 as well.   Diet: She is mindful about sodium intake. She does eat fast food occasionally. She mostly eats from home. She drinks mostly water and beet juice. She does drink carrot juice as well.   Exercise: Walk and climbs stairs in the home daily.  Home BP readings:   Wt Readings from Last 3 Encounters:  02/17/18 291 lb 9.6 oz (132.3 kg)  02/01/18 288 lb (130.6 kg)  01/07/18 288 lb (130.6 kg)   BP Readings from Last 3 Encounters:  02/17/18 140/90  02/01/18 (!) 134/95  01/07/18 105/64     Pulse Readings from Last 3 Encounters:  02/17/18 72  02/01/18 (!) 103  01/07/18 76    Renal function: CrCl cannot be calculated (Patient's most recent lab result is older than the maximum 21 days allowed.).  Past Medical History:  Diagnosis Date  . Anxiety   . Asthma   . Bipolar 1 disorder ([place])   . Chronic cholecystitis with calculus   . Depression   . Family history of adverse reaction to anesthesia    3rd cousin died during laser eye surgery age 78  . Headache    h/o migraines none recent  . History of acute heart failure    a. 06-17-2017 post partum secondary to continuous IV fluid administration during delivery - echo normal - required Lasix for diuresis.    . Hypertension   . Mild obstructive sleep apnea 01/28/2013 study   no cpap recommeded ;  recommended do not sleep supine, wt loss, sleep apnea surgery  during pregnacy    Current Outpatient Medications on File Prior to Visit  Medication Sig Dispense Refill  . acetaminophen (TYLENOL) 500 MG tablet Take 500 mg by mouth every 8 (eight) hours as needed for headache.    . albuterol (PROVENTIL HFA;VENTOLIN HFA) 108 (90 BASE) MCG/ACT inhaler Inhale 2 puffs into the lungs every 6 (six) hours as needed for wheezing or shortness of breath.    Marland Kitchen  amLODipine (NORVASC) 5 MG tablet Take 1 tablet (5 mg total) by mouth daily. 90 tablet 2  . chlorhexidine (PERIDEX) 0.12 % solution Use as directed 15 mLs in the mouth or throat 2 (two) times daily. 120 mL 0  . diphenhydrAMINE (BENADRYL) 25 MG tablet Take 1 tablet (25 mg total) by mouth every 6 (six) hours. 20 tablet 0  . Fluticasone-Salmeterol (ADVAIR DISKUS) 100-50 MCG/DOSE AEPB Inhale 1 puff into the lungs 2 (two) times daily.    . furosemide (LASIX) 40 MG tablet TAKE 1 TABLET(40 MG) BY MOUTH DAILY 90 tablet 0  . HYDROcodone-acetaminophen (NORCO/VICODIN) 5-325 MG tablet Take 1 tablet by mouth every 6 (six) hours as needed for severe pain. 10 tablet 0  . levonorgestrel (MIRENA, 52 MG,)  20 MCG/24HR IUD Mirena 20 mcg/24 hours (5 yrs) 52 mg intrauterine device  Take by intrauterine route.    . propranolol (INDERAL) 20 MG tablet Take 1 tablet (20 mg total) by mouth 2 (two) times daily. 180 tablet 2   No current facility-administered medications on file prior to visit.     Allergies  Allergen Reactions  . Haldol [Haloperidol] Shortness Of Breath, Palpitations and Rash    "difficulty breathing"  . Depakote [Divalproex Sodium] Hives and Swelling  . Hctz [Hydrochlorothiazide] Nausea Only    Pt reports causes "stomach cramps."  . Nifedipine     06/2017 admission - felt absolutely awful after even 1 dose  . Pineapple Swelling  . Strawberry Extract Swelling  . Divalproex Sodium Hives and Rash    Last menstrual period 10/01/2017, not currently breastfeeding.   Assessment/Plan: Hypertension: increase propranolol    Thank you, Lelan Pons. Patterson Hammersmith, [facility]  03/12/2018 7:05 AM

## 2018-03-12 NOTE — Patient Instructions (Addendum)
CONTINUE medications as prescribed.   Get a scale and keep track of your weights. If your weight increases 3lbs in a day or 5 lbs in a week please call our office.   Follow up with [name] as scheduled.

## 2018-03-19 DIAGNOSIS — M67431 Ganglion, right wrist: Secondary | ICD-10-CM | POA: Diagnosis not present

## 2018-03-19 DIAGNOSIS — R202 Paresthesia of skin: Secondary | ICD-10-CM | POA: Diagnosis not present

## 2018-03-19 DIAGNOSIS — M25531 Pain in right wrist: Secondary | ICD-10-CM | POA: Diagnosis not present

## 2018-03-20 DIAGNOSIS — N760 Acute vaginitis: Secondary | ICD-10-CM | POA: Diagnosis not present

## 2018-03-20 DIAGNOSIS — K625 Hemorrhage of anus and rectum: Secondary | ICD-10-CM | POA: Diagnosis not present

## 2018-03-20 DIAGNOSIS — R102 Pelvic and perineal pain: Secondary | ICD-10-CM | POA: Diagnosis not present

## 2018-03-20 DIAGNOSIS — K921 Melena: Secondary | ICD-10-CM | POA: Diagnosis not present

## 2018-03-20 DIAGNOSIS — Z30431 Encounter for routine checking of intrauterine contraceptive device: Secondary | ICD-10-CM | POA: Diagnosis not present

## 2018-04-15 DIAGNOSIS — E785 Hyperlipidemia, unspecified: Secondary | ICD-10-CM | POA: Diagnosis not present

## 2018-04-15 DIAGNOSIS — J45909 Unspecified asthma, uncomplicated: Secondary | ICD-10-CM | POA: Diagnosis not present

## 2018-04-15 DIAGNOSIS — H538 Other visual disturbances: Secondary | ICD-10-CM | POA: Diagnosis not present

## 2018-04-15 DIAGNOSIS — R1013 Epigastric pain: Secondary | ICD-10-CM | POA: Diagnosis not present

## 2018-04-15 DIAGNOSIS — Z0100 Encounter for examination of eyes and vision without abnormal findings: Secondary | ICD-10-CM | POA: Diagnosis not present

## 2018-04-15 DIAGNOSIS — Z0110 Encounter for examination of ears and hearing without abnormal findings: Secondary | ICD-10-CM | POA: Diagnosis not present

## 2018-04-15 DIAGNOSIS — Z131 Encounter for screening for diabetes mellitus: Secondary | ICD-10-CM | POA: Diagnosis not present

## 2018-04-15 DIAGNOSIS — I10 Essential (primary) hypertension: Secondary | ICD-10-CM | POA: Diagnosis not present

## 2018-04-15 DIAGNOSIS — Z0000 Encounter for general adult medical examination without abnormal findings: Secondary | ICD-10-CM | POA: Diagnosis not present

## 2018-04-15 DIAGNOSIS — R7303 Prediabetes: Secondary | ICD-10-CM | POA: Diagnosis not present

## 2018-04-18 ENCOUNTER — Telehealth: Payer: Self-pay | Admitting: Cardiology

## 2018-04-18 NOTE — Telephone Encounter (Signed)
Spoke with patient who c/o headache and elevated BP x 4-5 days. States she has not been monitoring BP regularly at home but states it was "fine" until h/a began 4-5 days ago. States Tylenol has not helped; started ASA yesterday which she states has helped with headache. Also c/o "sweating" but has not checked her temperature; denies respiratory s/s, sinus s/s, dizziness, or changes in vision. States she has gained weight recently but uncertain of how many much. I verified her cardiac medications and she confirms adherence. I advised her to take an ASA or ibuprofen on limited occasion and to seek a cool quiet place to lay down. I advised I will route message to hypertension clinic and [name] for further advice but that it may be difficult to adjust medications without a series of BP readings. I advised her to monitor for s/s of CVA and to seek immediate medical attention if any of those occur. Patient verbalized understanding and agreement with plan and thanked me for the call.

## 2018-04-18 NOTE — Telephone Encounter (Signed)
Called patient and scheduled her for BP check on Thursday 7/18 per [name]. Patient verbalized understanding and agreement with plan.

## 2018-04-18 NOTE — Telephone Encounter (Signed)
New Message    Pt c/o BP issue: STAT if pt c/o blurred vision, one-sided weakness or slurred speech  1. What are your last 5 BP readings? 145/95 2. Are you having any other symptoms (ex. Dizziness, headache, blurred vision, passed out)?  headache  3. What is your BP issue? Patient states she has a severe headache (for 4 days) tylenol is not helping, neither is her bp medication

## 2018-04-18 NOTE — Telephone Encounter (Signed)
I agree, please have her been seen by a nurse or a pharmacist for BP control. Thank you

## 2018-04-22 DIAGNOSIS — Z30432 Encounter for removal of intrauterine contraceptive device: Secondary | ICD-10-CM | POA: Diagnosis not present

## 2018-04-24 ENCOUNTER — Ambulatory Visit: Payer: Medicare HMO

## 2018-04-24 ENCOUNTER — Telehealth: Payer: Self-pay | Admitting: *Deleted

## 2018-04-24 NOTE — Telephone Encounter (Signed)
Spoke with the pt and scheduled her to come in and see our BP Clinic with our Pharmacist, for next Tuesday 04/29/18 at 1030.  Pt aware that this is at our [facility] location.  Pt aware to arrive 15 mins prior to this appt.  Pt aware to bring all her meds with her to that appt.  Will cancel the pts BP nurse visit for today.  This visit for 7/23 is for BP control per Dr Meda Coffee.  Pt verbalized understanding and agrees with this plan.

## 2018-04-24 NOTE — Telephone Encounter (Signed)
Left the pt a message to call back to endorse to her that we would like for her to be seen by our BP Clinic with our Pharmacist for next Tuesday 7/23, open slots for 0930, 1030, and 2 pm.  Pt is scheduled for nurse visit today, and preference is to cancel that visit, and bring her in with BP clinic, so an assessment can be made and med changes could also be made at that visit, as needed.  Advised the pt to call back ASAP.

## 2018-04-29 ENCOUNTER — Ambulatory Visit: Payer: Medicare HMO

## 2018-04-29 NOTE — Progress Notes (Deleted)
Patient ID: [name]                 DOB: 1986/03/09                      MRN: [id number]     HPI: [name] is a 32 y.o. female patient of [name] who presents today for hypertension follow up. PMH significant for obesity, HTN, OSA, Asthma, Bioplar 1 disorder, depression, former tobacco abuse, and prior alcohol/cannibis use. Her HTN was previously controlled on amlodipine 5mg . However, she recently called to clinic to report that she was pregnant. Her amlodipine was stopped and she was started on labetalol 200mg  BID for control while pregnant. Since this time the pregnancy has been terminated and amlodipine and HCTZ were restarted. Since this time she has called and reported that HCTZ was causing cramping and this was stopped. She was restarted on propranolol 20mg  BID. At her most recent visit she had concern for weight gain/swelling.   She has had several elevated pressures recently associated with headache relieved only by ASA.   Current HTN meds:  Amlodipine 5mg  daily Furosemide 40mg  daily Propranolol 20mg  BID  Previously tried: HCTZ - cramps  BP goal: <130/80  Family History: Asthma in her father and sister; Breast cancer in her maternal grandmother; CAD in her other; Cancer in her maternal grandfather, maternal grandmother, and other; Diabetes in her mother; Diverticulitis in her father and mother; Heart disease in her paternal grandfather and paternal grandmother; Hypertension in her paternal aunt and sister; Stomach cancer in her paternal grandfather.  Social History: Quit tobacco in 2014. Last alcoholic in 1660 as well.   Diet: She is mindful about sodium intake. She does eat fast food occasionally. She mostly eats from home. She drinks mostly water and beet juice. She does drink carrot juice as well.   Exercise: Walk and climbs stairs in the home daily.  Home BP readings: has not been monitoring  Wt Readings from Last 3 Encounters:  02/17/18 291 lb 9.6 oz  (132.3 kg)  02/01/18 288 lb (130.6 kg)  01/07/18 288 lb (130.6 kg)   BP Readings from Last 3 Encounters:  03/12/18 112/82  02/17/18 140/90  02/01/18 (!) 134/95   Pulse Readings from Last 3 Encounters:  03/12/18 63  02/17/18 72  02/01/18 (!) 103    Renal function: CrCl cannot be calculated (Patient's most recent lab result is older than the maximum 21 days allowed.).  Past Medical History:  Diagnosis Date  . Anxiety   . Asthma   . Bipolar 1 disorder ([place])   . Chronic cholecystitis with calculus   . Depression   . Family history of adverse reaction to anesthesia    3rd cousin died during laser eye surgery age 68  . Headache    h/o migraines none recent  . History of acute heart failure    a. 06-17-2017 post partum secondary to continuous IV fluid administration during delivery - echo normal - required Lasix for diuresis.    . Hypertension   . Mild obstructive sleep apnea 01/28/2013 study   no cpap recommeded ;  recommended do not sleep supine, wt loss, sleep apnea surgery  during pregnacy    Current Outpatient Medications on File Prior to Visit  Medication Sig Dispense Refill  . acetaminophen (TYLENOL) 500 MG tablet Take 500 mg by mouth every 8 (eight) hours as needed for headache.    . albuterol (PROVENTIL HFA;VENTOLIN HFA) 108 (90  BASE) MCG/ACT inhaler Inhale 2 puffs into the lungs every 6 (six) hours as needed for wheezing or shortness of breath.    Marland Kitchen amLODipine (NORVASC) 5 MG tablet Take 1 tablet (5 mg total) by mouth daily. 90 tablet 2  . chlorhexidine (PERIDEX) 0.12 % solution Use as directed 15 mLs in the mouth or throat 2 (two) times daily. 120 mL 0  . Fluticasone-Salmeterol (ADVAIR DISKUS) 100-50 MCG/DOSE AEPB Inhale 1 puff into the lungs 2 (two) times daily.    . furosemide (LASIX) 40 MG tablet TAKE 1 TABLET(40 MG) BY MOUTH DAILY 90 tablet 0  . HYDROcodone-acetaminophen (NORCO/VICODIN) 5-325 MG tablet Take 1 tablet by mouth every 6 (six) hours as needed for severe  pain. (Patient not taking: Reported on 03/12/2018) 10 tablet 0  . levonorgestrel (MIRENA, 52 MG,) 20 MCG/24HR IUD Mirena 20 mcg/24 hours (5 yrs) 52 mg intrauterine device  Take by intrauterine route.    . propranolol (INDERAL) 20 MG tablet Take 1 tablet (20 mg total) by mouth 2 (two) times daily. 180 tablet 2   No current facility-administered medications on file prior to visit.     Allergies  Allergen Reactions  . Haldol [Haloperidol] Shortness Of Breath, Palpitations and Rash    "difficulty breathing"  . Depakote [Divalproex Sodium] Hives and Swelling  . Hctz [Hydrochlorothiazide] Nausea Only    Pt reports causes "stomach cramps."  . Nifedipine     06/2017 admission - felt absolutely awful after even 1 dose  . Pineapple Swelling  . Strawberry Extract Swelling  . Divalproex Sodium Hives and Rash    Last menstrual period 10/01/2017, not currently breastfeeding.   Assessment/Plan: Hypertension: BP  - increase amlodipine?   Thank you, Lelan Pons. Patterson Hammersmith, [facility]  04/29/2018 7:31 AM

## 2018-05-07 DIAGNOSIS — F4321 Adjustment disorder with depressed mood: Secondary | ICD-10-CM | POA: Diagnosis not present

## 2018-05-13 ENCOUNTER — Encounter (HOSPITAL_BASED_OUTPATIENT_CLINIC_OR_DEPARTMENT_OTHER): Payer: Self-pay | Admitting: *Deleted

## 2018-05-13 DIAGNOSIS — F3341 Major depressive disorder, recurrent, in partial remission: Secondary | ICD-10-CM | POA: Diagnosis not present

## 2018-05-13 DIAGNOSIS — J45909 Unspecified asthma, uncomplicated: Secondary | ICD-10-CM | POA: Diagnosis not present

## 2018-05-13 DIAGNOSIS — R1013 Epigastric pain: Secondary | ICD-10-CM | POA: Diagnosis not present

## 2018-05-13 DIAGNOSIS — E559 Vitamin D deficiency, unspecified: Secondary | ICD-10-CM | POA: Diagnosis not present

## 2018-05-13 DIAGNOSIS — R7303 Prediabetes: Secondary | ICD-10-CM | POA: Diagnosis not present

## 2018-05-13 DIAGNOSIS — Z720 Tobacco use: Secondary | ICD-10-CM | POA: Diagnosis not present

## 2018-05-13 DIAGNOSIS — E785 Hyperlipidemia, unspecified: Secondary | ICD-10-CM | POA: Diagnosis not present

## 2018-05-13 DIAGNOSIS — J0101 Acute recurrent maxillary sinusitis: Secondary | ICD-10-CM | POA: Diagnosis not present

## 2018-05-14 ENCOUNTER — Other Ambulatory Visit: Payer: Self-pay

## 2018-05-14 ENCOUNTER — Encounter (HOSPITAL_BASED_OUTPATIENT_CLINIC_OR_DEPARTMENT_OTHER): Payer: Self-pay | Admitting: *Deleted

## 2018-05-14 NOTE — Progress Notes (Addendum)
Spoke w/ pt via phone for pre-op interview.  Npo after mn.  Arrive at 0600.  Needs urine preg.  Getting cbc and bmet done Friday 05-23-2018 @ 1300 and have anesthesia cunsult since pt has BMI 48.92.  Current ekg in chart and epic.  Will take am meds dos w/ sips of water with exception no lasix. Asked to bring rescue inhaler.  ADDENDUM:  Called and spoke w/ pt via phone since she had not showed up for lab/ anesthesia consult appointment at 1300.  Pt stated she is unable to make appointment because her father is having urgent surgery today.  Pt will need lab drawn on arrival [place].  Will review chart w/ anesthesia.  ADDENDUM:  Spoke w/ dr e. fitzgerald mda, face to face, about pt not making it today for her anesthesia consult. Per dr e. Ola Spurr pt will get assessed dos.

## 2018-05-15 ENCOUNTER — Ambulatory Visit (INDEPENDENT_AMBULATORY_CARE_PROVIDER_SITE_OTHER): Payer: Medicare HMO | Admitting: Pharmacist

## 2018-05-15 ENCOUNTER — Encounter

## 2018-05-15 ENCOUNTER — Encounter: Payer: Self-pay | Admitting: Pharmacist

## 2018-05-15 VITALS — BP 134/78 | HR 60

## 2018-05-15 DIAGNOSIS — I10 Essential (primary) hypertension: Secondary | ICD-10-CM | POA: Diagnosis not present

## 2018-05-15 NOTE — Patient Instructions (Addendum)
It was great seeing you today!  After your labs come back, we will call you and discuss adding a medication. Continue taking your amlodipine at 10 mg daily. We will send in new prescriptions to [facility] based on labwork. Do not take more than 10 mg of amlodipine daily. Check your legs for abnormal swelling. Try to monitor your weights at home. When you see your PCP in September, try to get her opinion about your headaches.
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
## 2018-05-16 LAB — BASIC METABOLIC PANEL
BUN/Creatinine Ratio: 14 (ref 9–23)
BUN: 10 mg/dL (ref 6–20)
CO2: 25 mmol/L (ref 20–29)
Calcium: 9.6 mg/dL (ref 8.7–10.2)
Chloride: 106 mmol/L (ref 96–106)
Creatinine, Ser: 0.74 mg/dL (ref 0.57–1.00)
GFR calc Af Amer: 126 mL/min/{1.73_m2} (ref >59)
GFR calc non Af Amer: 109 mL/min/{1.73_m2} (ref >59)
Glucose: 88 mg/dL (ref 65–99)
Potassium: 4.4 mmol/L (ref 3.5–5.2)
Sodium: 138 mmol/L (ref 134–144)

## 2018-05-16 MED ORDER — AMLODIPINE BESYLATE 10 MG PO TABS: 10.0000 mg | ORAL_TABLET | Freq: Every day | ORAL | 2 refills | Status: DC

## 2018-05-16 MED ORDER — LISINOPRIL 10 MG PO TABS: 10.0000 mg | ORAL_TABLET | Freq: Every day | ORAL | 3 refills | Status: DC

## 2018-05-23 ENCOUNTER — Inpatient Hospital Stay (HOSPITAL_COMMUNITY): Admission: RE | Admit: 2018-05-23 | Payer: Medicare HMO | Source: Ambulatory Visit

## 2018-05-25 ENCOUNTER — Encounter (HOSPITAL_BASED_OUTPATIENT_CLINIC_OR_DEPARTMENT_OTHER): Payer: Self-pay | Admitting: Anesthesiology
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
## 2018-05-25 NOTE — Anesthesia Preprocedure Evaluation (Deleted)
Anesthesia Evaluation    Reviewed: Allergy & Precautions, Patient's Chart, lab work & pertinent test results  Airway        Dental   Pulmonary former smoker,           Cardiovascular hypertension, Pt. on medications      Neuro/Psych PSYCHIATRIC DISORDERS Anxiety Depression Bipolar Disorder    GI/Hepatic   Endo/Other  Morbid obesity  Renal/GU      Musculoskeletal   Abdominal   Peds  Hematology   Anesthesia Other Findings   Reproductive/Obstetrics                             Anesthesia Physical Anesthesia Plan  ASA: III  Anesthesia Plan: General   Post-op Pain Management:    Induction: Intravenous  PONV Risk Score and Plan: 4 or greater and 3 and Scopolamine patch - Pre-op, Ondansetron and Dexamethasone  Airway Management Planned: Oral ETT  Additional Equipment:   Intra-op Plan:   Post-operative Plan:   Informed Consent:   Plan Discussed with:   Anesthesia Plan Comments:         Anesthesia Quick Evaluation

## 2018-05-26 ENCOUNTER — Ambulatory Visit (HOSPITAL_BASED_OUTPATIENT_CLINIC_OR_DEPARTMENT_OTHER)
Admission: RE | Admit: 2018-05-26 | Payer: Medicare HMO | Source: Ambulatory Visit | Admitting: Obstetrics and Gynecology

## 2018-05-26 HISTORY — DX: Personal history of suicidal behavior: Z91.51

## 2018-05-26 HISTORY — DX: Personal history of self-harm: Z91.5

## 2018-05-26 HISTORY — DX: Personal history of other infectious and parasitic diseases: Z86.19

## 2018-05-26 HISTORY — DX: Personal history of Methicillin resistant Staphylococcus aureus infection: Z86.14

## 2018-05-26 HISTORY — DX: Carpal tunnel syndrome, right upper limb: G56.01

## 2018-05-26 SURGERY — LIGATION, FALLOPIAN TUBE, LAPAROSCOPIC
Anesthesia: General | Laterality: Bilateral

## 2018-06-11 DIAGNOSIS — F3341 Major depressive disorder, recurrent, in partial remission: Secondary | ICD-10-CM | POA: Diagnosis not present

## 2018-06-11 DIAGNOSIS — I10 Essential (primary) hypertension: Secondary | ICD-10-CM | POA: Diagnosis not present

## 2018-06-11 DIAGNOSIS — R51 Headache: Secondary | ICD-10-CM | POA: Diagnosis not present

## 2018-06-11 DIAGNOSIS — J45909 Unspecified asthma, uncomplicated: Secondary | ICD-10-CM | POA: Diagnosis not present

## 2018-06-11 DIAGNOSIS — Z720 Tobacco use: Secondary | ICD-10-CM | POA: Diagnosis not present

## 2018-06-11 DIAGNOSIS — R7303 Prediabetes: Secondary | ICD-10-CM | POA: Diagnosis not present

## 2018-06-11 DIAGNOSIS — E785 Hyperlipidemia, unspecified: Secondary | ICD-10-CM | POA: Diagnosis not present

## 2018-06-11 DIAGNOSIS — E559 Vitamin D deficiency, unspecified: Secondary | ICD-10-CM | POA: Diagnosis not present

## 2018-06-23 DIAGNOSIS — Z720 Tobacco use: Secondary | ICD-10-CM | POA: Diagnosis not present

## 2018-06-23 DIAGNOSIS — E785 Hyperlipidemia, unspecified: Secondary | ICD-10-CM | POA: Diagnosis not present

## 2018-06-23 DIAGNOSIS — R1013 Epigastric pain: Secondary | ICD-10-CM | POA: Diagnosis not present

## 2018-06-23 DIAGNOSIS — R51 Headache: Secondary | ICD-10-CM | POA: Diagnosis not present

## 2018-06-23 DIAGNOSIS — I10 Essential (primary) hypertension: Secondary | ICD-10-CM | POA: Diagnosis not present

## 2018-06-23 DIAGNOSIS — R7303 Prediabetes: Secondary | ICD-10-CM | POA: Diagnosis not present

## 2018-06-23 DIAGNOSIS — E559 Vitamin D deficiency, unspecified: Secondary | ICD-10-CM | POA: Diagnosis not present

## 2018-06-23 DIAGNOSIS — J45909 Unspecified asthma, uncomplicated: Secondary | ICD-10-CM | POA: Diagnosis not present

## 2018-06-27 ENCOUNTER — Ambulatory Visit: Payer: Medicare HMO | Admitting: Cardiology

## 2018-06-27 ENCOUNTER — Other Ambulatory Visit: Payer: Medicare HMO

## 2018-07-07 ENCOUNTER — Ambulatory Visit: Payer: Medicare HMO | Admitting: Cardiology

## 2018-07-15 DIAGNOSIS — Z124 Encounter for screening for malignant neoplasm of cervix: Secondary | ICD-10-CM | POA: Diagnosis not present

## 2018-07-15 DIAGNOSIS — E785 Hyperlipidemia, unspecified: Secondary | ICD-10-CM | POA: Diagnosis not present

## 2018-07-15 DIAGNOSIS — E559 Vitamin D deficiency, unspecified: Secondary | ICD-10-CM | POA: Diagnosis not present

## 2018-07-15 DIAGNOSIS — J45909 Unspecified asthma, uncomplicated: Secondary | ICD-10-CM | POA: Diagnosis not present

## 2018-07-15 DIAGNOSIS — Z720 Tobacco use: Secondary | ICD-10-CM | POA: Diagnosis not present

## 2018-07-15 DIAGNOSIS — R7303 Prediabetes: Secondary | ICD-10-CM | POA: Diagnosis not present

## 2018-07-15 DIAGNOSIS — R1013 Epigastric pain: Secondary | ICD-10-CM | POA: Diagnosis not present

## 2018-07-15 DIAGNOSIS — R51 Headache: Secondary | ICD-10-CM | POA: Diagnosis not present

## 2018-07-15 DIAGNOSIS — I10 Essential (primary) hypertension: Secondary | ICD-10-CM | POA: Diagnosis not present

## 2018-08-04 DIAGNOSIS — E785 Hyperlipidemia, unspecified: Secondary | ICD-10-CM | POA: Diagnosis not present

## 2018-08-04 DIAGNOSIS — R7303 Prediabetes: Secondary | ICD-10-CM | POA: Diagnosis not present

## 2018-08-04 DIAGNOSIS — Z720 Tobacco use: Secondary | ICD-10-CM | POA: Diagnosis not present

## 2018-08-04 DIAGNOSIS — R1013 Epigastric pain: Secondary | ICD-10-CM | POA: Diagnosis not present

## 2018-08-04 DIAGNOSIS — I10 Essential (primary) hypertension: Secondary | ICD-10-CM | POA: Diagnosis not present

## 2018-08-04 DIAGNOSIS — E559 Vitamin D deficiency, unspecified: Secondary | ICD-10-CM | POA: Diagnosis not present

## 2018-08-04 DIAGNOSIS — J45909 Unspecified asthma, uncomplicated: Secondary | ICD-10-CM | POA: Diagnosis not present

## 2018-08-04 DIAGNOSIS — R51 Headache: Secondary | ICD-10-CM | POA: Diagnosis not present

## 2018-08-04 DIAGNOSIS — J0100 Acute maxillary sinusitis, unspecified: Secondary | ICD-10-CM | POA: Diagnosis not present

## 2018-08-05 ENCOUNTER — Encounter

## 2018-08-05 ENCOUNTER — Ambulatory Visit: Payer: Medicare HMO | Admitting: Cardiology
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
## 2018-11-17 DIAGNOSIS — Z720 Tobacco use: Secondary | ICD-10-CM | POA: Diagnosis not present

## 2018-11-17 DIAGNOSIS — J45909 Unspecified asthma, uncomplicated: Secondary | ICD-10-CM | POA: Diagnosis not present

## 2018-11-17 DIAGNOSIS — R51 Headache: Secondary | ICD-10-CM | POA: Diagnosis not present

## 2018-11-17 DIAGNOSIS — Z113 Encounter for screening for infections with a predominantly sexual mode of transmission: Secondary | ICD-10-CM | POA: Diagnosis not present

## 2018-11-17 DIAGNOSIS — R7303 Prediabetes: Secondary | ICD-10-CM | POA: Diagnosis not present

## 2018-11-17 DIAGNOSIS — Z124 Encounter for screening for malignant neoplasm of cervix: Secondary | ICD-10-CM | POA: Diagnosis not present

## 2018-11-17 DIAGNOSIS — E785 Hyperlipidemia, unspecified: Secondary | ICD-10-CM | POA: Diagnosis not present

## 2018-11-17 DIAGNOSIS — E559 Vitamin D deficiency, unspecified: Secondary | ICD-10-CM | POA: Diagnosis not present

## 2018-11-17 DIAGNOSIS — I10 Essential (primary) hypertension: Secondary | ICD-10-CM | POA: Diagnosis not present

## 2018-11-17 DIAGNOSIS — R1013 Epigastric pain: Secondary | ICD-10-CM | POA: Diagnosis not present

## 2018-12-01 DIAGNOSIS — Z0100 Encounter for examination of eyes and vision without abnormal findings: Secondary | ICD-10-CM | POA: Diagnosis not present

## 2018-12-01 DIAGNOSIS — R1013 Epigastric pain: Secondary | ICD-10-CM | POA: Diagnosis not present

## 2018-12-01 DIAGNOSIS — Z0000 Encounter for general adult medical examination without abnormal findings: Secondary | ICD-10-CM | POA: Diagnosis not present

## 2018-12-01 DIAGNOSIS — Z0110 Encounter for examination of ears and hearing without abnormal findings: Secondary | ICD-10-CM | POA: Diagnosis not present

## 2018-12-01 DIAGNOSIS — R51 Headache: Secondary | ICD-10-CM | POA: Diagnosis not present

## 2018-12-01 DIAGNOSIS — J45909 Unspecified asthma, uncomplicated: Secondary | ICD-10-CM | POA: Diagnosis not present

## 2018-12-01 DIAGNOSIS — Z720 Tobacco use: Secondary | ICD-10-CM | POA: Diagnosis not present

## 2018-12-01 DIAGNOSIS — Z131 Encounter for screening for diabetes mellitus: Secondary | ICD-10-CM | POA: Diagnosis not present

## 2018-12-01 DIAGNOSIS — E785 Hyperlipidemia, unspecified: Secondary | ICD-10-CM | POA: Diagnosis not present

## 2018-12-25 DIAGNOSIS — R1013 Epigastric pain: Secondary | ICD-10-CM | POA: Diagnosis not present

## 2018-12-25 DIAGNOSIS — R7303 Prediabetes: Secondary | ICD-10-CM | POA: Diagnosis not present

## 2018-12-25 DIAGNOSIS — J45909 Unspecified asthma, uncomplicated: Secondary | ICD-10-CM | POA: Diagnosis not present

## 2018-12-25 DIAGNOSIS — R51 Headache: Secondary | ICD-10-CM | POA: Diagnosis not present

## 2018-12-25 DIAGNOSIS — E782 Mixed hyperlipidemia: Secondary | ICD-10-CM | POA: Diagnosis not present

## 2018-12-25 DIAGNOSIS — Z720 Tobacco use: Secondary | ICD-10-CM | POA: Diagnosis not present

## 2018-12-25 DIAGNOSIS — F3341 Major depressive disorder, recurrent, in partial remission: Secondary | ICD-10-CM | POA: Diagnosis not present

## 2018-12-25 DIAGNOSIS — I10 Essential (primary) hypertension: Secondary | ICD-10-CM | POA: Diagnosis not present

## 2019-01-19 DIAGNOSIS — J45909 Unspecified asthma, uncomplicated: Secondary | ICD-10-CM | POA: Diagnosis not present

## 2019-01-19 DIAGNOSIS — J028 Acute pharyngitis due to other specified organisms: Secondary | ICD-10-CM | POA: Diagnosis not present

## 2019-01-19 DIAGNOSIS — E782 Mixed hyperlipidemia: Secondary | ICD-10-CM | POA: Diagnosis not present

## 2019-01-19 DIAGNOSIS — R51 Headache: Secondary | ICD-10-CM | POA: Diagnosis not present

## 2019-01-19 DIAGNOSIS — Z720 Tobacco use: Secondary | ICD-10-CM | POA: Diagnosis not present

## 2019-01-19 DIAGNOSIS — I10 Essential (primary) hypertension: Secondary | ICD-10-CM | POA: Diagnosis not present

## 2019-01-19 DIAGNOSIS — J302 Other seasonal allergic rhinitis: Secondary | ICD-10-CM | POA: Diagnosis not present

## 2019-01-19 DIAGNOSIS — F3341 Major depressive disorder, recurrent, in partial remission: Secondary | ICD-10-CM | POA: Diagnosis not present

## 2019-01-19 DIAGNOSIS — R1013 Epigastric pain: Secondary | ICD-10-CM | POA: Diagnosis not present

## 2019-02-16 DIAGNOSIS — F3341 Major depressive disorder, recurrent, in partial remission: Secondary | ICD-10-CM | POA: Diagnosis not present

## 2019-02-16 DIAGNOSIS — E782 Mixed hyperlipidemia: Secondary | ICD-10-CM | POA: Diagnosis not present

## 2019-02-16 DIAGNOSIS — J302 Other seasonal allergic rhinitis: Secondary | ICD-10-CM | POA: Diagnosis not present

## 2019-02-16 DIAGNOSIS — I10 Essential (primary) hypertension: Secondary | ICD-10-CM | POA: Diagnosis not present

## 2019-02-16 DIAGNOSIS — R1013 Epigastric pain: Secondary | ICD-10-CM | POA: Diagnosis not present

## 2019-02-16 DIAGNOSIS — R51 Headache: Secondary | ICD-10-CM | POA: Diagnosis not present

## 2019-02-16 DIAGNOSIS — T300 Burn of unspecified body region, unspecified degree: Secondary | ICD-10-CM | POA: Diagnosis not present

## 2019-02-16 DIAGNOSIS — Z720 Tobacco use: Secondary | ICD-10-CM | POA: Diagnosis not present

## 2019-02-16 DIAGNOSIS — J45909 Unspecified asthma, uncomplicated: Secondary | ICD-10-CM | POA: Diagnosis not present

## 2019-03-11 ENCOUNTER — Inpatient Hospital Stay (HOSPITAL_COMMUNITY)
Admission: AD | Admit: 2019-03-11 | Discharge: 2019-03-11 | Discharge status: Against Medical Advice | Payer: Medicare HMO | Attending: Obstetrics and Gynecology | Admitting: Obstetrics and Gynecology

## 2019-03-11 ENCOUNTER — Other Ambulatory Visit: Payer: Self-pay

## 2019-03-11 ENCOUNTER — Telehealth: Payer: Self-pay | Admitting: Cardiology

## 2019-03-11 DIAGNOSIS — Z5321 Procedure and treatment not carried out due to patient leaving prior to being seen by health care provider: Secondary | ICD-10-CM | POA: Diagnosis not present

## 2019-03-11 DIAGNOSIS — N939 Abnormal uterine and vaginal bleeding, unspecified: Secondary | ICD-10-CM | POA: Diagnosis present

## 2019-03-11 LAB — POCT PREGNANCY, URINE: Preg Test, Ur: NEGATIVE

## 2019-03-11 NOTE — Telephone Encounter (Signed)
New Message             Patient is pregnant and is having heart flutters, patient wants to come in to be seen but there is nothing available, pls advise.

## 2019-03-11 NOTE — Telephone Encounter (Signed)
Pt calling in to make an appt, as advised to, for she is having heart palpitations, fluttering, and just found out she is pregnant again. Pt states she is on a lot of cardiac meds that are unsafe for her baby, and will need those readjusted again.  Pt states the fluttering sensation is uncomfortable but manageable at this time.  Pt unaware of her HR and BP.  Tried assisting her on how to take a radial pulse, but she could not feel it.  Pt states she is ok at this time, but is insisting to be seen vs virtual visit.  Pt is asking if she can be seen by Dr Meda Coffee or an APP in our office tomorrow.  Pt states she has not had her first OBGYN appt yet, but that is coming soon.  Scheduled the pt to see Cecilie Kicks NP for tomorrow 03/12/19 at 0945.  Explained to the pt that there is a restriction on family members coming into the office, and she will need to come alone.  Advised the pt of our screening process when she comes to her appt tomorrow.  Also did covid 19 pre-screening questions, and pt cleared to be seen. Informed the pt that I will route this message to Dr Meda Coffee as an Juluis Rainier, that the pt will be seen by Cecilie Kicks NP tomorrow at [phone], for complaints mentioned and being pregnant.  Pt verbalized understanding and agrees with this plan.

## 2019-03-11 NOTE — MAU Note (Signed)
.   [name] is a 33 y.o. at Unknown here in MAU reporting: vaginal bleeding with lower abdominal cramping 3 positive home pregnancy test LMP: 02/05/19 Onset of complaint: today Pain score: 4 Vitals:   03/11/19 1506 03/11/19 1507  BP: 129/85   Pulse: 65   Resp: 15   Temp:  98.5 F (36.9 C)  SpO2: 100%      Lab orders placed from triage: UPT/Quant

## 2019-03-11 NOTE — Telephone Encounter (Signed)
    COVID-19 Pre-Screening Questions:  . In the past 7 to 10 days have you had a cough,  shortness of breath, headache, congestion, fever (100 or greater) body aches, chills, sore throat, or sudden loss of taste or sense of smell? NO . Have you been around anyone with known Covid 19.-NO . Have you been around anyone who is awaiting Covid 19 test results in the past 7 to 10 days?-NO . Have you been around anyone who has been exposed to Covid 19, or has mentioned symptoms of Covid 19 within the past 7 to 10 days?-NO

## 2019-03-11 NOTE — Telephone Encounter (Signed)
Last saw [name] 2019

## 2019-03-12 ENCOUNTER — Other Ambulatory Visit: Payer: Self-pay | Admitting: Cardiology

## 2019-03-13 ENCOUNTER — Encounter: Payer: Self-pay | Admitting: Cardiology

## 2019-03-13 ENCOUNTER — Other Ambulatory Visit: Payer: Self-pay

## 2019-03-13 ENCOUNTER — Ambulatory Visit (INDEPENDENT_AMBULATORY_CARE_PROVIDER_SITE_OTHER): Payer: Medicare HMO | Admitting: Cardiology

## 2019-03-13 ENCOUNTER — Ambulatory Visit: Payer: Medicare HMO | Admitting: Cardiology

## 2019-03-13 VITALS — BP 122/70 | HR 69 | Wt 304.0 lb

## 2019-03-13 DIAGNOSIS — Z3A08 8 weeks gestation of pregnancy: Secondary | ICD-10-CM | POA: Diagnosis not present

## 2019-03-13 DIAGNOSIS — I10 Essential (primary) hypertension: Secondary | ICD-10-CM

## 2019-03-13 DIAGNOSIS — O200 Threatened abortion: Secondary | ICD-10-CM | POA: Diagnosis not present
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
## 2019-03-13 NOTE — Patient Instructions (Addendum)
Medication Instructions:  Your physician has recommended you make the following change in your medication:  1.  STOP the Amlodipine 2.  STOP the Lisinopril  If you need a refill on your cardiac medications before your next appointment, please call your pharmacy.   Lab work: None ordered  If you have labs (blood work) drawn today and your tests are completely normal, you will receive your results only by: Marland Kitchen MyChart Message (if you have MyChart) OR . A paper copy in the mail If you have any lab test that is abnormal or we need to change your treatment, we will call you to review the results.  Testing/Procedures: None ordered  Follow-Up: At [facility], you and your health needs are our priority.  As part of our continuing mission to provide you with exceptional heart care, we have created designated Provider Care Teams.  These Care Teams include your primary Cardiologist (physician) and Advanced Practice Providers (APPs -  Physician Assistants and Nurse Practitioners) who all work together to provide you with the care you need, when you need it. You will need a follow up appointment in 1 WEEK:  03/20/2019 AT 12:15.  PLEASE ARRIVE AT 12:00 FOR REGISTRATION   Any Other Special Instructions Will Be Listed Below (If Applicable).

## 2019-03-19 ENCOUNTER — Telehealth: Payer: Self-pay | Admitting: *Deleted

## 2019-03-19 NOTE — Telephone Encounter (Signed)
-----   Message from Rivka Barbara sent at 03/19/2019 10:35 AM EDT ----- Regarding: RE: add to Nelson's schedule per Cecilie Kicks NP [place],  I scheduled her for 7/13 virtual. She wanted me to let you all know that she lost her baby.  ----- Message ----- From: Nuala Alpha, LPN Sent: 4/69/6295   9:07 AM EDT To: Rivka Barbara Subject: add to Nelson's schedule per Cecilie Kicks NP   Below is message Cecilie Kicks sent on this pt after she saw her in the office.  Can you schedule her with Meda Coffee in 3-4 weeks as virtual visit.  Tell her I will call her closer to that date to explain how the visit will work.   Thanks for being the best, Lorean Ekstrand   Previous Messages  ----- Message -----  From: Isaiah Serge, NP  Sent: 03/16/2019  2:41 PM EDT  To: Jeanann Lewandowsky, RMA   Could you please ask Ermalinda Barrios to put Ms [name] on Dr. [name] schedule in 3-4 weeks per [name] thanks. I would have sent to [facility] but could not think of last name.

## 2019-03-20 ENCOUNTER — Ambulatory Visit: Payer: Medicare HMO | Admitting: Physician Assistant
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
## 2019-04-03 DIAGNOSIS — J45909 Unspecified asthma, uncomplicated: Secondary | ICD-10-CM | POA: Diagnosis not present

## 2019-04-03 DIAGNOSIS — R1013 Epigastric pain: Secondary | ICD-10-CM | POA: Diagnosis not present

## 2019-04-03 DIAGNOSIS — I10 Essential (primary) hypertension: Secondary | ICD-10-CM | POA: Diagnosis not present

## 2019-04-03 DIAGNOSIS — J302 Other seasonal allergic rhinitis: Secondary | ICD-10-CM | POA: Diagnosis not present

## 2019-04-03 DIAGNOSIS — E782 Mixed hyperlipidemia: Secondary | ICD-10-CM | POA: Diagnosis not present

## 2019-04-03 DIAGNOSIS — F3341 Major depressive disorder, recurrent, in partial remission: Secondary | ICD-10-CM | POA: Diagnosis not present

## 2019-04-03 DIAGNOSIS — R7303 Prediabetes: Secondary | ICD-10-CM | POA: Diagnosis not present

## 2019-04-03 DIAGNOSIS — Z720 Tobacco use: Secondary | ICD-10-CM | POA: Diagnosis not present

## 2019-04-03 DIAGNOSIS — Z3202 Encounter for pregnancy test, result negative: Secondary | ICD-10-CM | POA: Diagnosis not present

## 2019-04-03 DIAGNOSIS — R51 Headache: Secondary | ICD-10-CM | POA: Diagnosis not present

## 2019-04-08 DIAGNOSIS — I10 Essential (primary) hypertension: Secondary | ICD-10-CM | POA: Diagnosis not present

## 2019-04-08 DIAGNOSIS — J4520 Mild intermittent asthma, uncomplicated: Secondary | ICD-10-CM | POA: Diagnosis not present

## 2019-04-08 DIAGNOSIS — Z8759 Personal history of other complications of pregnancy, childbirth and the puerperium: Secondary | ICD-10-CM | POA: Diagnosis not present

## 2019-04-08 DIAGNOSIS — F329 Major depressive disorder, single episode, unspecified: Secondary | ICD-10-CM | POA: Diagnosis not present

## 2019-04-08 DIAGNOSIS — Z9049 Acquired absence of other specified parts of digestive tract: Secondary | ICD-10-CM | POA: Diagnosis not present

## 2019-04-15 ENCOUNTER — Telehealth: Payer: Self-pay | Admitting: *Deleted

## 2019-04-15 NOTE — Telephone Encounter (Signed)
.      COVID-19 Pre-Screening Questions:  . In the past 7 to 10 days have you had a cough,  shortness of breath, headache, congestion, fever (100 or greater) body aches, chills, sore throat, or sudden loss of taste or sense of smell?NO . Have you been around anyone with known Covid 19.NO . Have you been around anyone who is awaiting Covid 19 test results in the past 7 to 10 days?NO . Have you been around anyone who has been exposed to Covid 19, or has mentioned symptoms of Covid 19 within the past 7 to 10 days?NO   Pt covid pre-screen questions were done and answered "no" to all.  Pt is aware of no visitor policy and to wear her face mask at all times to this appt.  Pt aware not to arrive more that 15 minutes prior to appointment time Pt verbalized understanding and agrees with this plan   If you have any concerns/questions about symptoms patients report during screening (either on the phone or at threshold). Contact the provider seeing the patient or DOD for further guidance.  If neither are available contact a member of the leadership team.            

## 2019-04-20 ENCOUNTER — Other Ambulatory Visit: Payer: Self-pay

## 2019-04-20 ENCOUNTER — Ambulatory Visit (INDEPENDENT_AMBULATORY_CARE_PROVIDER_SITE_OTHER): Payer: Medicare HMO | Admitting: Cardiology

## 2019-04-20 ENCOUNTER — Encounter: Payer: Self-pay | Admitting: Cardiology

## 2019-04-20 VITALS — BP 124/72 | HR 72 | Ht 65.0 in | Wt 304.6 lb

## 2019-04-20 DIAGNOSIS — Z01810 Encounter for preprocedural cardiovascular examination: Secondary | ICD-10-CM | POA: Diagnosis not present

## 2019-04-20 DIAGNOSIS — I5032 Chronic diastolic (congestive) heart failure: Secondary | ICD-10-CM | POA: Diagnosis not present

## 2019-04-20 DIAGNOSIS — I10 Essential (primary) hypertension: Secondary | ICD-10-CM | POA: Diagnosis not present

## 2019-04-20 DIAGNOSIS — Z6841 Body Mass Index (BMI) 40.0 and over, adult: Secondary | ICD-10-CM | POA: Diagnosis not present

## 2019-04-20 DIAGNOSIS — G4719 Other hypersomnia: Secondary | ICD-10-CM | POA: Diagnosis not present

## 2019-04-20 DIAGNOSIS — R0683 Snoring: Secondary | ICD-10-CM | POA: Diagnosis not present
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
## 2019-04-20 NOTE — Patient Instructions (Signed)
Medication Instructions:   Your physician recommends that you continue on your current medications as directed. Please refer to the Current Medication list given to you today.  If you need a refill on your cardiac medications before your next appointment, please call your pharmacy.       Follow-Up: At [facility], you and your health needs are our priority.  As part of our continuing mission to provide you with exceptional heart care, we have created designated Provider Care Teams.  These Care Teams include your primary Cardiologist (physician) and Advanced Practice Providers (APPs -  Physician Assistants and Nurse Practitioners) who all work together to provide you with the care you need, when you need it. You will need a follow up appointment in 6 months.  Please call our office 2 months in advance to schedule this appointment.  You may see Katarina Nelson, MD or one of the following Advanced Practice Providers on your designated Care Team:   Brittainy Simmons, PA-C Dayna Dunn, PA-C . Michele Lenze, PA-C     

## 2019-04-21 DIAGNOSIS — R51 Headache: Secondary | ICD-10-CM | POA: Diagnosis not present

## 2019-04-21 DIAGNOSIS — R7303 Prediabetes: Secondary | ICD-10-CM | POA: Diagnosis not present

## 2019-04-21 DIAGNOSIS — J302 Other seasonal allergic rhinitis: Secondary | ICD-10-CM | POA: Diagnosis not present

## 2019-04-21 DIAGNOSIS — I10 Essential (primary) hypertension: Secondary | ICD-10-CM | POA: Diagnosis not present

## 2019-04-21 DIAGNOSIS — R1013 Epigastric pain: Secondary | ICD-10-CM | POA: Diagnosis not present

## 2019-04-21 DIAGNOSIS — E782 Mixed hyperlipidemia: Secondary | ICD-10-CM | POA: Diagnosis not present

## 2019-04-21 DIAGNOSIS — Z720 Tobacco use: Secondary | ICD-10-CM | POA: Diagnosis not present

## 2019-04-21 DIAGNOSIS — F3341 Major depressive disorder, recurrent, in partial remission: Secondary | ICD-10-CM | POA: Diagnosis not present

## 2019-04-21 DIAGNOSIS — J45909 Unspecified asthma, uncomplicated: Secondary | ICD-10-CM | POA: Diagnosis not present

## 2019-04-23 DIAGNOSIS — Z7189 Other specified counseling: Secondary | ICD-10-CM | POA: Diagnosis not present

## 2019-04-26 ENCOUNTER — Other Ambulatory Visit: Payer: Self-pay | Admitting: Cardiology

## 2019-05-02 DIAGNOSIS — Z6841 Body Mass Index (BMI) 40.0 and over, adult: Secondary | ICD-10-CM | POA: Diagnosis not present

## 2019-05-02 DIAGNOSIS — G4733 Obstructive sleep apnea (adult) (pediatric): Secondary | ICD-10-CM | POA: Diagnosis not present

## 2019-05-07 DIAGNOSIS — G4719 Other hypersomnia: Secondary | ICD-10-CM | POA: Diagnosis not present

## 2019-05-07 DIAGNOSIS — R0683 Snoring: Secondary | ICD-10-CM | POA: Diagnosis not present

## 2019-05-07 DIAGNOSIS — G4733 Obstructive sleep apnea (adult) (pediatric): Secondary | ICD-10-CM | POA: Diagnosis not present

## 2019-05-07 DIAGNOSIS — Z9989 Dependence on other enabling machines and devices: Secondary | ICD-10-CM | POA: Diagnosis not present

## 2019-05-14 DIAGNOSIS — Z7189 Other specified counseling: Secondary | ICD-10-CM | POA: Diagnosis not present

## 2019-05-21 DIAGNOSIS — Z8759 Personal history of other complications of pregnancy, childbirth and the puerperium: Secondary | ICD-10-CM | POA: Diagnosis not present

## 2019-05-21 DIAGNOSIS — F329 Major depressive disorder, single episode, unspecified: Secondary | ICD-10-CM | POA: Diagnosis not present

## 2019-05-21 DIAGNOSIS — G4733 Obstructive sleep apnea (adult) (pediatric): Secondary | ICD-10-CM | POA: Diagnosis not present

## 2019-05-21 DIAGNOSIS — Z9989 Dependence on other enabling machines and devices: Secondary | ICD-10-CM | POA: Diagnosis not present

## 2019-05-21 DIAGNOSIS — J4520 Mild intermittent asthma, uncomplicated: Secondary | ICD-10-CM | POA: Diagnosis not present

## 2019-05-21 DIAGNOSIS — I10 Essential (primary) hypertension: Secondary | ICD-10-CM | POA: Diagnosis not present

## 2019-05-27 DIAGNOSIS — G4733 Obstructive sleep apnea (adult) (pediatric): Secondary | ICD-10-CM | POA: Diagnosis not present

## 2019-06-04 DIAGNOSIS — Z9989 Dependence on other enabling machines and devices: Secondary | ICD-10-CM | POA: Diagnosis not present

## 2019-06-04 DIAGNOSIS — R05 Cough: Secondary | ICD-10-CM | POA: Diagnosis not present

## 2019-06-04 DIAGNOSIS — J4520 Mild intermittent asthma, uncomplicated: Secondary | ICD-10-CM | POA: Diagnosis not present

## 2019-06-04 DIAGNOSIS — R0683 Snoring: Secondary | ICD-10-CM | POA: Diagnosis not present

## 2019-06-04 DIAGNOSIS — G4733 Obstructive sleep apnea (adult) (pediatric): Secondary | ICD-10-CM | POA: Diagnosis not present

## 2019-06-09 DIAGNOSIS — Z713 Dietary counseling and surveillance: Secondary | ICD-10-CM | POA: Diagnosis not present

## 2019-06-16 DIAGNOSIS — Z7189 Other specified counseling: Secondary | ICD-10-CM | POA: Diagnosis not present

## 2019-06-20 DIAGNOSIS — Z20828 Contact with and (suspected) exposure to other viral communicable diseases: Secondary | ICD-10-CM | POA: Diagnosis not present

## 2019-06-20 DIAGNOSIS — Z01812 Encounter for preprocedural laboratory examination: Secondary | ICD-10-CM | POA: Diagnosis not present

## 2019-06-20 DIAGNOSIS — Z6841 Body Mass Index (BMI) 40.0 and over, adult: Secondary | ICD-10-CM | POA: Diagnosis not present

## 2019-06-24 DIAGNOSIS — K219 Gastro-esophageal reflux disease without esophagitis: Secondary | ICD-10-CM | POA: Diagnosis not present

## 2019-06-24 DIAGNOSIS — Z9989 Dependence on other enabling machines and devices: Secondary | ICD-10-CM | POA: Diagnosis not present

## 2019-06-24 DIAGNOSIS — I10 Essential (primary) hypertension: Secondary | ICD-10-CM | POA: Diagnosis not present

## 2019-06-24 DIAGNOSIS — I509 Heart failure, unspecified: Secondary | ICD-10-CM | POA: Diagnosis not present

## 2019-06-24 DIAGNOSIS — Z91018 Allergy to other foods: Secondary | ICD-10-CM | POA: Diagnosis not present

## 2019-06-24 DIAGNOSIS — F329 Major depressive disorder, single episode, unspecified: Secondary | ICD-10-CM | POA: Diagnosis not present

## 2019-06-24 DIAGNOSIS — G4733 Obstructive sleep apnea (adult) (pediatric): Secondary | ICD-10-CM | POA: Diagnosis not present

## 2019-06-24 DIAGNOSIS — J4520 Mild intermittent asthma, uncomplicated: Secondary | ICD-10-CM | POA: Diagnosis not present

## 2019-06-24 DIAGNOSIS — Z6841 Body Mass Index (BMI) 40.0 and over, adult: Secondary | ICD-10-CM | POA: Diagnosis not present

## 2019-06-24 DIAGNOSIS — I110 Hypertensive heart disease with heart failure: Secondary | ICD-10-CM | POA: Diagnosis not present

## 2019-06-27 DIAGNOSIS — G4733 Obstructive sleep apnea (adult) (pediatric): Secondary | ICD-10-CM | POA: Diagnosis not present

## 2019-07-06 DIAGNOSIS — R69 Illness, unspecified: Secondary | ICD-10-CM | POA: Diagnosis not present

## 2019-07-15 DIAGNOSIS — Z6841 Body Mass Index (BMI) 40.0 and over, adult: Secondary | ICD-10-CM | POA: Diagnosis not present

## 2019-07-15 DIAGNOSIS — Z723 Lack of physical exercise: Secondary | ICD-10-CM | POA: Diagnosis not present

## 2019-07-15 DIAGNOSIS — Z713 Dietary counseling and surveillance: Secondary | ICD-10-CM | POA: Diagnosis not present

## 2019-07-16 ENCOUNTER — Telehealth: Payer: Self-pay | Admitting: Cardiology

## 2019-07-16 NOTE — Telephone Encounter (Signed)
New message:    Patient need a order. Please call patient.

## 2019-07-16 NOTE — Telephone Encounter (Signed)
Called and spoke to patient. She is requesting a letter stating that she can begin an exercise program with [facility]. She is requesting this letter be faxed to [phone].

## 2019-07-17 ENCOUNTER — Encounter: Payer: Self-pay | Admitting: *Deleted

## 2019-07-17 NOTE — Telephone Encounter (Signed)
To whom it may concern,   Mrs Macfadyen is currently under my care and tere is no contraindication for her to start an exercise program with [facility] with no limitations.   Please call us with any questions.   Warm regards,   Ena Dawley, MD  Cardiologist

## 2019-07-17 NOTE — Telephone Encounter (Signed)
Spoke with the pt and informed her that her letter was written on behalf of [name], and sent to her mychart to have and provide the Bariat[facility] Clinic. Pt verbalized understanding. Also faxed this to the number she requested, via epic fax function.

## 2019-07-17 NOTE — Telephone Encounter (Signed)
Letter created for the pt on behalf of [name], and sent to her active mychart account.

## 2019-07-27 DIAGNOSIS — G4733 Obstructive sleep apnea (adult) (pediatric): Secondary | ICD-10-CM | POA: Diagnosis not present

## 2019-08-07 ENCOUNTER — Other Ambulatory Visit: Payer: Self-pay | Admitting: Cardiology

## 2019-08-17 ENCOUNTER — Emergency Department (HOSPITAL_COMMUNITY)
Admission: EM | Admit: 2019-08-17 | Discharge: 2019-08-18 | Disposition: A | Discharge status: Home / Self Care | Payer: Medicare HMO | Attending: Emergency Medicine | Admitting: Emergency Medicine

## 2019-08-17 ENCOUNTER — Encounter (HOSPITAL_COMMUNITY): Payer: Self-pay

## 2019-08-17 ENCOUNTER — Emergency Department (HOSPITAL_COMMUNITY): Payer: Medicare HMO

## 2019-08-17 ENCOUNTER — Other Ambulatory Visit: Payer: Self-pay

## 2019-08-17 DIAGNOSIS — Z5321 Procedure and treatment not carried out due to patient leaving prior to being seen by health care provider: Secondary | ICD-10-CM | POA: Diagnosis not present

## 2019-08-17 DIAGNOSIS — R1013 Epigastric pain: Secondary | ICD-10-CM | POA: Diagnosis not present

## 2019-08-17 LAB — URINALYSIS, ROUTINE W REFLEX MICROSCOPIC
Bilirubin Urine: NEGATIVE
Glucose, UA: NEGATIVE mg/dL
Ketones, ur: NEGATIVE mg/dL
Leukocytes,Ua: NEGATIVE
Nitrite: NEGATIVE
Protein, ur: NEGATIVE mg/dL
Specific Gravity, Urine: 1.025 (ref 1.005–1.030)
pH: 5 (ref 5.0–8.0)

## 2019-08-17 LAB — CBC
HCT: 39.4 % (ref 36.0–46.0)
Hemoglobin: 12.4 g/dL (ref 12.0–15.0)
MCH: 28.5 pg (ref 26.0–34.0)
MCHC: 31.5 g/dL (ref 30.0–36.0)
MCV: 90.6 fL (ref 80.0–100.0)
Platelets: 342 10*3/uL (ref 150–400)
RBC: 4.35 MIL/uL (ref 3.87–5.11)
RDW: 14.5 % (ref 11.5–15.5)
WBC: 7.7 10*3/uL (ref 4.0–10.5)
nRBC: 0 % (ref 0.0–0.2)

## 2019-08-17 LAB — COMPREHENSIVE METABOLIC PANEL WITH GFR
ALT: 34 U/L (ref 0–44)
AST: 30 U/L (ref 15–41)
Albumin: 3.9 g/dL (ref 3.5–5.0)
Alkaline Phosphatase: 57 U/L (ref 38–126)
Anion gap: 12 (ref 5–15)
BUN: 14 mg/dL (ref 6–20)
CO2: 23 mmol/L (ref 22–32)
Calcium: 9.3 mg/dL (ref 8.9–10.3)
Chloride: 103 mmol/L (ref 98–111)
Creatinine, Ser: 0.91 mg/dL (ref 0.44–1.00)
GFR calc Af Amer: >60 mL/min
GFR calc non Af Amer: >60 mL/min
Glucose, Bld: 93 mg/dL (ref 70–99)
Potassium: 4 mmol/L (ref 3.5–5.1)
Sodium: 138 mmol/L (ref 135–145)
Total Bilirubin: 0.6 mg/dL (ref 0.3–1.2)
Total Protein: 7.4 g/dL (ref 6.5–8.1)

## 2019-08-17 LAB — LIPASE, BLOOD: Lipase: 22 U/L (ref 11–51)

## 2019-08-17 LAB — I-STAT BETA HCG BLOOD, ED (MC, WL, AP ONLY): I-stat hCG, quantitative: <5 m[IU]/mL (ref <5)

## 2019-08-17 LAB — TROPONIN I (HIGH SENSITIVITY): Troponin I (High Sensitivity): <2 ng/L (ref <18)

## 2019-08-17 MED ORDER — SODIUM CHLORIDE 0.9% FLUSH: 3.0000 mL | Freq: Once | INTRAVENOUS | Status: DC

## 2019-08-17 NOTE — ED Triage Notes (Signed)
Pt reports intermittent epigastric pain for a while now but it got worse around 5pm today. Hx of CHF. Denies any other  Symptoms. Pt a.o, nad noted at this time.

## 2019-08-18 NOTE — ED Notes (Signed)
No answer x3

## 2019-08-18 NOTE — ED Notes (Signed)
No answer for vitals recheck x2 

## 2019-08-27 DIAGNOSIS — G4733 Obstructive sleep apnea (adult) (pediatric): Secondary | ICD-10-CM | POA: Diagnosis not present

## 2019-08-28 DIAGNOSIS — Z131 Encounter for screening for diabetes mellitus: Secondary | ICD-10-CM | POA: Diagnosis not present

## 2019-08-28 DIAGNOSIS — R7303 Prediabetes: Secondary | ICD-10-CM | POA: Diagnosis not present

## 2019-08-28 DIAGNOSIS — Z23 Encounter for immunization: Secondary | ICD-10-CM | POA: Diagnosis not present

## 2019-08-28 DIAGNOSIS — J45909 Unspecified asthma, uncomplicated: Secondary | ICD-10-CM | POA: Diagnosis not present

## 2019-08-28 DIAGNOSIS — Z720 Tobacco use: Secondary | ICD-10-CM | POA: Diagnosis not present

## 2019-08-28 DIAGNOSIS — I10 Essential (primary) hypertension: Secondary | ICD-10-CM | POA: Diagnosis not present

## 2019-08-28 DIAGNOSIS — R1013 Epigastric pain: Secondary | ICD-10-CM | POA: Diagnosis not present

## 2019-08-28 DIAGNOSIS — Z0001 Encounter for general adult medical examination with abnormal findings: Secondary | ICD-10-CM | POA: Diagnosis not present

## 2019-08-28 DIAGNOSIS — E782 Mixed hyperlipidemia: Secondary | ICD-10-CM | POA: Diagnosis not present

## 2019-08-28 DIAGNOSIS — F3341 Major depressive disorder, recurrent, in partial remission: Secondary | ICD-10-CM | POA: Diagnosis not present

## 2019-08-28 DIAGNOSIS — J302 Other seasonal allergic rhinitis: Secondary | ICD-10-CM | POA: Diagnosis not present

## 2019-09-07 DIAGNOSIS — G4733 Obstructive sleep apnea (adult) (pediatric): Secondary | ICD-10-CM | POA: Diagnosis not present

## 2019-09-07 DIAGNOSIS — Z713 Dietary counseling and surveillance: Secondary | ICD-10-CM | POA: Diagnosis not present

## 2019-09-09 DIAGNOSIS — G4733 Obstructive sleep apnea (adult) (pediatric): Secondary | ICD-10-CM | POA: Diagnosis not present

## 2019-09-09 DIAGNOSIS — J4530 Mild persistent asthma, uncomplicated: Secondary | ICD-10-CM | POA: Diagnosis not present

## 2019-09-09 DIAGNOSIS — Z6841 Body Mass Index (BMI) 40.0 and over, adult: Secondary | ICD-10-CM | POA: Diagnosis not present

## 2019-09-09 DIAGNOSIS — R0683 Snoring: Secondary | ICD-10-CM | POA: Diagnosis not present

## 2019-09-14 ENCOUNTER — Other Ambulatory Visit: Payer: Self-pay

## 2019-09-14 ENCOUNTER — Inpatient Hospital Stay (HOSPITAL_COMMUNITY)
Admission: AD | Admit: 2019-09-14 | Discharge: 2019-09-15 | Disposition: A | Discharge status: Home / Self Care | Payer: Medicare HMO | Attending: Obstetrics and Gynecology | Admitting: Obstetrics and Gynecology

## 2019-09-14 ENCOUNTER — Inpatient Hospital Stay (HOSPITAL_COMMUNITY): Payer: Medicare HMO

## 2019-09-14 ENCOUNTER — Encounter (HOSPITAL_COMMUNITY): Payer: Self-pay | Admitting: *Deleted

## 2019-09-14 DIAGNOSIS — N83201 Unspecified ovarian cyst, right side: Secondary | ICD-10-CM | POA: Insufficient documentation

## 2019-09-14 DIAGNOSIS — R1030 Lower abdominal pain, unspecified: Secondary | ICD-10-CM

## 2019-09-14 DIAGNOSIS — D252 Subserosal leiomyoma of uterus: Secondary | ICD-10-CM | POA: Insufficient documentation

## 2019-09-14 DIAGNOSIS — O3680X0 Pregnancy with inconclusive fetal viability, not applicable or unspecified: Secondary | ICD-10-CM | POA: Diagnosis not present

## 2019-09-14 DIAGNOSIS — Z87891 Personal history of nicotine dependence: Secondary | ICD-10-CM | POA: Diagnosis not present

## 2019-09-14 DIAGNOSIS — O26891 Other specified pregnancy related conditions, first trimester: Secondary | ICD-10-CM

## 2019-09-14 DIAGNOSIS — Z3490 Encounter for supervision of normal pregnancy, unspecified, unspecified trimester: Secondary | ICD-10-CM

## 2019-09-14 DIAGNOSIS — Z3A01 Less than 8 weeks gestation of pregnancy: Secondary | ICD-10-CM | POA: Diagnosis not present

## 2019-09-14 DIAGNOSIS — O3481 Maternal care for other abnormalities of pelvic organs, first trimester: Secondary | ICD-10-CM | POA: Insufficient documentation

## 2019-09-14 DIAGNOSIS — O26899 Other specified pregnancy related conditions, unspecified trimester: Secondary | ICD-10-CM

## 2019-09-14 DIAGNOSIS — O3411 Maternal care for benign tumor of corpus uteri, first trimester: Secondary | ICD-10-CM | POA: Insufficient documentation

## 2019-09-14 DIAGNOSIS — Z8614 Personal history of Methicillin resistant Staphylococcus aureus infection: Secondary | ICD-10-CM | POA: Diagnosis not present

## 2019-09-14 DIAGNOSIS — Z3A00 Weeks of gestation of pregnancy not specified: Secondary | ICD-10-CM | POA: Diagnosis not present

## 2019-09-14 DIAGNOSIS — Z3689 Encounter for other specified antenatal screening: Secondary | ICD-10-CM | POA: Diagnosis not present

## 2019-09-14 DIAGNOSIS — R935 Abnormal findings on diagnostic imaging of other abdominal regions, including retroperitoneum: Secondary | ICD-10-CM

## 2019-09-14 DIAGNOSIS — R109 Unspecified abdominal pain: Secondary | ICD-10-CM | POA: Diagnosis not present

## 2019-09-14 LAB — URINALYSIS, ROUTINE W REFLEX MICROSCOPIC
Bilirubin Urine: NEGATIVE
Glucose, UA: NEGATIVE mg/dL
Hgb urine dipstick: NEGATIVE
Ketones, ur: NEGATIVE mg/dL
Leukocytes,Ua: NEGATIVE
Nitrite: NEGATIVE
Protein, ur: NEGATIVE mg/dL
Specific Gravity, Urine: 1.025 (ref 1.005–1.030)
pH: 6 (ref 5.0–8.0)

## 2019-09-14 LAB — CBC
HCT: 37.4 % (ref 36.0–46.0)
Hemoglobin: 12.1 g/dL (ref 12.0–15.0)
MCH: 28.6 pg (ref 26.0–34.0)
MCHC: 32.4 g/dL (ref 30.0–36.0)
MCV: 88.4 fL (ref 80.0–100.0)
Platelets: 307 10*3/uL (ref 150–400)
RBC: 4.23 MIL/uL (ref 3.87–5.11)
RDW: 14.7 % (ref 11.5–15.5)
WBC: 9.8 10*3/uL (ref 4.0–10.5)
nRBC: 0 % (ref 0.0–0.2)

## 2019-09-14 LAB — HCG, QUANTITATIVE, PREGNANCY: hCG, Beta Chain, Quant, S: 65 m[IU]/mL — ABNORMAL HIGH (ref <5)

## 2019-09-14 LAB — POCT PREGNANCY, URINE: Preg Test, Ur: POSITIVE — AB

## 2019-09-14 LAB — WET PREP, GENITAL
Clue Cells Wet Prep HPF POC: NONE SEEN
Sperm: NONE SEEN
Trich, Wet Prep: NONE SEEN
Yeast Wet Prep HPF POC: NONE SEEN
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
## 2019-09-14 NOTE — MAU Note (Signed)
Patient presents to MAU c/o home + pregnancy test. LMP 08/11/2019 Denies vaginal bleeding. Patient states she is anxious. 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

## 2019-09-15 DIAGNOSIS — O26891 Other specified pregnancy related conditions, first trimester: Secondary | ICD-10-CM | POA: Diagnosis not present

## 2019-09-15 NOTE — Discharge Instructions (Signed)
Human Chorionic Gonadotropin Test Why am I having this test? A human chorionic gonadotropin (hCG) test is done to determine whether you are pregnant. It can also be used: To diagnose an abnormal pregnancy. To determine whether you have had a failed pregnancy (miscarriage) or are at risk of one. What is being tested? This test checks the level of the human chorionic gonadotropin (hCG) hormone in the blood. This hormone is produced during pregnancy by the cells that form the placenta. The placenta is the organ that grows inside your womb (uterus) to nourish a developing baby. When you are pregnant, hCG can be detected in your blood or urine 7 to 8 days before your missed period. It continues to go up for the first 8-10 weeks of pregnancy. The presence of hCG in your blood can be measured with several different types of tests. You may have: A urine test. Because this hormone is eliminated from your body by your kidneys, you may have a urine test to find out whether you are pregnant. A home pregnancy test detects whether there is hCG in your urine. A urine test only shows whether there is hCG in your urine. It does not measure how much. A qualitative blood test. You may have this type of blood test to find out if you are pregnant. This blood test only shows whether there is hCG in your blood. It does not measure how much. A quantitative blood test. This type of blood test measures the amount of hCG in your blood. You may have this test to: Diagnose an abnormal pregnancy. Check whether you have had a miscarriage. Determine whether you are at risk of a miscarriage. What kind of sample is taken?     Two kinds of samples may be collected to test for the hCG hormone. Blood. It is usually collected by inserting a needle into a blood vessel. Urine. It is usually collected by urinating into a germ-free (sterile) specimen cup. It is best to collect the sample the first time you urinate in the  morning. How do I prepare for this test? No preparation is needed for a blood test.  For the urine test: Let your health care provider know about: All medicines you are taking, including vitamins, herbs, creams, and over-the-counter medicines. Any blood in your urine. This may interfere with the result. Do not drink too much fluid. Drink as you normally would, or as directed by your health care provider. How are the results reported? Depending on the type of test that you have, your test results may be reported as values. Your health care provider will compare your results to normal ranges that were established after testing a large group of people (reference ranges). Reference ranges may vary among labs and hospitals. For this test, common reference ranges that show absence of pregnancy are: Quantitative hCG blood levels: less than 5 IU/L. Other results will be reported as either positive or negative. For this test, normal results (meaning the absence of pregnancy) are: Negative for hCG in the urine test. Negative for hCG in the qualitative blood test. What do the results mean? Urine and qualitative blood test A negative result could mean: That you are not pregnant. That the test was done too early in your pregnancy to detect hCG in your blood or urine. If you still have other signs of pregnancy, the test will be repeated. A positive result means: That you are most likely pregnant. Your health care provider may confirm your pregnancy with  an imaging study (ultrasound) of your uterus, if needed. Quantitative blood test Results of the quantitative hCG blood test will be interpreted as follows: Less than 5 IU/L: You are most likely not pregnant. Greater than 25 IU/L: You are most likely pregnant. hCG levels that are higher than expected: You are pregnant with twins. You have abnormal growths in the uterus. hCG levels that are rising more slowly than expected: You have an ectopic pregnancy  (also called a tubal pregnancy). hCG levels that are falling: You may be having a miscarriage. Talk with your health care provider about what your results mean. Questions to ask your health care provider Ask your health care provider, or the department that is doing the test: When will my results be ready? How will I get my results? What are my treatment options? What other tests do I need? What are my next steps? Summary A human chorionic gonadotropin test is done to determine whether you are pregnant. When you are pregnant, hCG can be detected in your blood or urine 7 to 8 days before your missed period. It continues to go up for the first 8-10 weeks of pregnancy. Your hCG level can be measured with different types of tests. You may have a urine test, a qualitative blood test, or a quantitative blood test. Talk with your health care provider about what your results mean. This information is not intended to replace advice given to you by your health care provider. Make sure you discuss any questions you have with your health care provider. Document Released: 10/26/2004 Document Revised: 08/26/2017 Document Reviewed: 08/26/2017 Elsevier Patient Education  2020 [name], Adult  Cardiomyopathy is a long-term (chronic) disease of the heart muscle. The disease makes the heart muscle thick, weak, or stiff. As a result, the heart works harder to pump blood. Over time, cardiomyopathy can lead to an irregular heartbeat (arrhythmia) and heart failure. There are several types of cardiomyopathy. The kind of cardiomyopathy that you have depends on what part of the heart is affected and how it is affected. What are the causes? This condition may be caused by:  A medical condition that damages the heart, such as diabetes, high blood pressure, or heart attack.  Diseases of the immune system, connective tissues, or endocrine system.  Alcohol abuse.  Using illegal drugs.  Taking  certain medicines.  Pregnancy.  Too much iron in the body (hemochromatosis).  Cancer treatments.  Protein buildup in the organs (amyloidosis), or inflammation in the organs (sarcoidosis). In some cases, the cause is not known. What increases the risk? You are more likely to develop this condition if:  You have a gene that is passed down (inherited) from a family member.  You are overweight or obese. What are the signs or symptoms? Often, people with this condition have no symptoms. If you do have symptoms, this may include:  Shortness of breath, especially during activity.  Fatigue.  An irregular heartbeat.  Feeling dizzy or light-headed.  Fainting.  Chest pain.  Coughing.  Swelling of the lower legs, feet, abdomen, or neck veins. How is this diagnosed? This condition is diagnosed based on:  Your symptoms and medical history.  A physical exam.  Blood tests and imaging studies, such as X-ray, echocardiogram, or MRI.  Other tests, such as: ? An electrocardiogram (ECG). ? A portable heart monitor that records your heart's electrical activity (event monitor). ? A stress test. ? Cardiac catheterization and coronary angiogram. ? Heart tissue biopsy. How is this  treated? Your treatment depends on the type and severity of your symptoms. If you do not have symptoms, you may not need treatment. Treatment may include:  General healthy lifestyle changes.  Medicines to: ? Treat high blood pressure, abnormal heart rate, or inflammation. ? Clear excess fluids from your body. ? Prevent blood clots. ? Balance minerals (electrolytes) in your body and get rid of extra sodium in your body. ? Strengthen your heartbeat.  Surgery to: ? Repair a heart defect, remove heart tissue, or destroy tissues in the area of abnormal electrical activity (ablation). ? Implant a device to treat serious heart rhythm problems or to restore the heart's ability to pump blood. ? Replace your  heart with a healthy heart. This is done if all other treatments have failed. Other treatments may include:  Cardiac resynchronization therapy (CRT). This restores the heart's normal beating. Follow these instructions at home: Lifestyle   Eat a heart-healthy diet that includes plenty of fruits, vegetables, whole grains, and foods that are low in salt (sodium).  Maintain a healthy weight.  Stay physically active. Ask your health care provider what activities are safe for you.  Do not use any products that contain nicotine or tobacco, such as cigarettes, e-cigarettes, and chewing tobacco. If you need help quitting, ask your health care provider.  Try to get at least 7 hours of sleep each night.  Find healthy ways to manage stress. Alcohol use  Do not drink alcohol if: ? Your health care provider tells you not to drink. ? You are pregnant, may be pregnant, or are planning to become pregnant. If you drink alcohol:  Limit how much you use to: ? 0-1 drink a day for women. ? 0-2 drinks a day for men.  Be aware of how much alcohol is in your drink. In the [place] one drink equals one 12 oz bottle of beer (355 mL), one 5 oz glass of wine (148 mL), or one 1 oz glass of hard liquor (44 mL). General instructions  Take over-the-counter and prescription medicines only as told by your health care provider. Some medicines can be dangerous for your heart.  If you are prescribed a blood thinner, make sure you understand what to do in a bleeding situation.  Tell all health care providers, including your dentist, that you have cardiomyopathy. Ask your health care provider if you need antibiotics before having dental care or before surgery.  Ask your health care provider if you should wear a medical identification bracelet. This may be important if you have a pacemaker or a defibrillator.  Get all needed vaccines. Get a flu shot every year.  Work closely with your health care provider to manage  any chronic conditions.  If you plan to start a family, talk to a genetic counselor to discuss the risk of having a child with cardiomyopathy.  Keep all follow-up visits as told by your health care provider. This is important, even if you do not have any symptoms. Your health care provider may need to make sure your condition is not getting worse. Contact a health care provider if:  Your symptoms get worse.  You have new symptoms. Get help right away if you:  Have severe chest pain.  Have shortness of breath.  Cough up a pink, bubbly substance.  Feel nauseous and you vomit.  Suddenly become light-headed or dizzy.  Feel your heart beating very quickly.  Feel like your heart is skipping beats. These symptoms may represent a serious problem  that is an emergency. Do not wait to see if the symptoms will go away. Get medical help right away. Call your local emergency services (911 in the [place] Do not drive yourself to the hospital. Summary  Cardiomyopathy is a long-term (chronic) disease of the heart muscle.  Over time, cardiomyopathy can lead to an irregular heartbeat (arrhythmia) and heart failure.  A number of treatments are available for this condition. Your treatment will depend on the type and severity of your symptoms.  Follow your health care provider's instructions on diet, medicines, physical activity, and when to seek help. This information is not intended to replace advice given to you by your health care provider. Make sure you discuss any questions you have with your health care provider. Document Released: 12/07/2004 Document Revised: 11/27/2018 Document Reviewed: 11/27/2018 Elsevier Patient Education  2020 [employer].

## 2019-09-16 LAB — GC/CHLAMYDIA PROBE AMP (~~LOC~~) NOT AT ARMC
Chlamydia: NEGATIVE
Comment: NEGATIVE
Comment: NORMAL
Neisseria Gonorrhea: NEGATIVE

## 2019-09-17 ENCOUNTER — Ambulatory Visit (INDEPENDENT_AMBULATORY_CARE_PROVIDER_SITE_OTHER): Payer: Medicare HMO | Admitting: Lactation Services

## 2019-09-17 ENCOUNTER — Other Ambulatory Visit: Payer: Self-pay

## 2019-09-17 ENCOUNTER — Telehealth: Payer: Self-pay | Admitting: Cardiology

## 2019-09-17 DIAGNOSIS — O0990 Supervision of high risk pregnancy, unspecified, unspecified trimester: Secondary | ICD-10-CM

## 2019-09-17 DIAGNOSIS — Z3490 Encounter for supervision of normal pregnancy, unspecified, unspecified trimester: Secondary | ICD-10-CM | POA: Diagnosis not present

## 2019-09-17 DIAGNOSIS — O3680X0 Pregnancy with inconclusive fetal viability, not applicable or unspecified: Secondary | ICD-10-CM | POA: Diagnosis not present

## 2019-09-17 LAB — BETA HCG QUANT (REF LAB): hCG Quant: 193 m[IU]/mL

## 2019-09-17 NOTE — Addendum Note (Signed)
Addended by: Donn Pierini on: 09/17/2019 12:08 PM   Modules accepted: Orders

## 2019-09-17 NOTE — Telephone Encounter (Signed)
She should stop taking both lisinopril and propranolol and start labetalol 200 mg po BID. Please schedule her for the hypertension clinic in 2-3 weeks to monitor her BP> Thank you, KN

## 2019-09-17 NOTE — Addendum Note (Signed)
Addended by: Donn Pierini on: 09/17/2019 09:37 AM   Modules accepted: Orders

## 2019-09-17 NOTE — Telephone Encounter (Signed)
[name], pt is expecting again.  She would like for you to advise on what cardiac medications she needs to stop or have switched, that are safe for her new pregnancy.  Please advise!

## 2019-09-17 NOTE — Telephone Encounter (Signed)
Patient is calling stating she is expecting and would like to know what mediations she needs to withhold and if any new medications need to be started. Please Advise.

## 2019-09-17 NOTE — Progress Notes (Addendum)
Pt here for follow up Beta Hcg. Pt denies cramping or bleeding. Reviewed what we are looking for in regards to levels.   Reviewed with pt that once results are back we will call her with results and follow up recommendations. Pt voiced understanding.   Informed of when to seek emergency care for bleeding or pain.   Results Hcg 193. Spoke with Dr. Kennon Rounds. Plan is for dating [place] in 10 days. [place] scheduled for 12/17 previously and will keep that appt.   Pt was called at 12:04 pm and results given. Pt informed to keep her [place] appt on 12/17, pt voiced understanding.

## 2019-09-18 MED ORDER — LABETALOL HCL 200 MG PO TABS: 200.0000 mg | ORAL_TABLET | Freq: Two times a day (BID) | ORAL | 1 refills | Status: DC

## 2019-09-18 NOTE — Telephone Encounter (Signed)
Left the pt a message to call the office back to endorse new medication changes and recommendations, as well as arrange 2 week follow-up appt in BP clinic, per [name].

## 2019-09-18 NOTE — Telephone Encounter (Signed)
Spoke with the pt and informed her that [name] recommends that she STOP taking lisinopril and propranolol, and start taking Labetalol 200 mg po BID, and follow-up in BP clinic here at the office in 2-3 weeks.  Confirmed the pharmacy of choice. Scheduled the pt to come in for BP clinic in 2 weeks on 10/05/19 at 3:30 pm.  Also advised the pt that she needs to keep her follow-up with her OBGYN, and keep them in the loop about these med changes, so they can also closely follow her. Pt verbalized understanding and agrees with this plan.

## 2019-09-18 NOTE — Progress Notes (Signed)
Patient seen and assessed by nursing staff.  Agree with documentation and plan.  

## 2019-09-23 ENCOUNTER — Other Ambulatory Visit: Payer: Self-pay | Admitting: Cardiology

## 2019-09-23 MED ORDER — AMLODIPINE BESYLATE 5 MG PO TABS: 5.0000 mg | ORAL_TABLET | Freq: Every day | ORAL | 3 refills | Status: DC

## 2019-09-23 NOTE — Telephone Encounter (Signed)
Returned call to pt and she has been made aware that her refill of Amlodipine has been sent to her requested pharmacy.  Pt also was reminded to keep her f/u appt with the Pharmacist in the BP Clinic. Pt verbalized understanding and thanked me for letting her know.

## 2019-09-23 NOTE — Telephone Encounter (Signed)
*  STAT* If patient is at the pharmacy, call can be transferred to refill team.   1. Which medications need to be refilled? (please list name of each medication and dose if known) amLODipine (NORVASC) 5 MG tablet  2. Which pharmacy/location (including street and city if local pharmacy) is medication to be sent to? [place], [place]  3. Do they need a 30 day or 90 day supply? 90 with refills  Patient thought at her last appointment [name] increased the dosage from 5 mg to 10 mg.  She was out of refills and will need a new rx sent to her pharmacy  Patient is out of medication

## 2019-09-24 ENCOUNTER — Ambulatory Visit (INDEPENDENT_AMBULATORY_CARE_PROVIDER_SITE_OTHER): Payer: Medicare HMO | Admitting: General Practice

## 2019-09-24 ENCOUNTER — Other Ambulatory Visit: Payer: Self-pay

## 2019-09-24 ENCOUNTER — Ambulatory Visit (HOSPITAL_COMMUNITY)
Admission: RE | Admit: 2019-09-24 | Discharge: 2019-09-24 | Disposition: A | Discharge status: Home / Self Care | Payer: Medicare HMO | Source: Ambulatory Visit

## 2019-09-24 DIAGNOSIS — O3680X0 Pregnancy with inconclusive fetal viability, not applicable or unspecified: Secondary | ICD-10-CM

## 2019-09-24 DIAGNOSIS — Z712 Person consulting for explanation of examination or test findings: Secondary | ICD-10-CM

## 2019-09-24 DIAGNOSIS — Z3A01 Less than 8 weeks gestation of pregnancy: Secondary | ICD-10-CM | POA: Diagnosis not present

## 2019-09-24 DIAGNOSIS — Z3201 Encounter for pregnancy test, result positive: Secondary | ICD-10-CM | POA: Diagnosis not present

## 2019-09-24 NOTE — Progress Notes (Signed)
Patient presents to office today for viability ultrasound results. Reviewed results with Gavin Pound who finds appropriate progression in pregnancy from last ultrasound- patient needs follow up ultrasound in 2 weeks. Scheduled ultrasound for 12/30 @ 9am.  Reviewed all results with patient and discussed dating. Informed patient of ultrasound appt date. Patient verbalized understanding & will follow up in 2 weeks for results.  Koren Bound RN BSN 09/24/19

## 2019-09-25 NOTE — Progress Notes (Signed)
Patient seen and assessed by nursing staff during this encounter. I have reviewed the chart and agree with the documentation and plan.  Maryann Conners, CNM 09/25/2019 12:08 PM

## 2019-09-26 DIAGNOSIS — G4733 Obstructive sleep apnea (adult) (pediatric): Secondary | ICD-10-CM | POA: Diagnosis not present

## 2019-09-28 DIAGNOSIS — Z3201 Encounter for pregnancy test, result positive: Secondary | ICD-10-CM | POA: Diagnosis not present

## 2019-09-28 DIAGNOSIS — N911 Secondary amenorrhea: Secondary | ICD-10-CM | POA: Diagnosis not present

## 2019-10-05 ENCOUNTER — Ambulatory Visit: Payer: Medicare HMO
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
## 2019-10-07 ENCOUNTER — Other Ambulatory Visit: Payer: Self-pay

## 2019-10-07 ENCOUNTER — Ambulatory Visit: Payer: Medicare HMO

## 2019-10-07 ENCOUNTER — Ambulatory Visit (HOSPITAL_COMMUNITY): Admission: RE | Admit: 2019-10-07 | Payer: Medicare HMO | Source: Ambulatory Visit

## 2019-10-07 ENCOUNTER — Ambulatory Visit (INDEPENDENT_AMBULATORY_CARE_PROVIDER_SITE_OTHER): Payer: Medicare HMO | Admitting: Pharmacist

## 2019-10-07 VITALS — BP 116/82 | HR 73

## 2019-10-07 DIAGNOSIS — I10 Essential (primary) hypertension: Secondary | ICD-10-CM

## 2019-10-07 MED ORDER — AMLODIPINE BESYLATE 10 MG PO TABS: 10.0000 mg | ORAL_TABLET | Freq: Every day | ORAL | 3 refills | Status: DC
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
## 2019-10-07 NOTE — Patient Instructions (Addendum)
It was nice to meet you today  Your blood pressure is excellent at goal < 130/71mmHg  Continue taking your current medications, including amlodipine 10mg  daily, labetalol 200mg  twice daily, and furosemide 40mg  daily

## 2019-10-09 NOTE — L&D Delivery Note (Signed)
Delivery Note She progressed to complete and pushed well.  At 6:41 AM a viable female was delivered via  (Presentation: Right Occiput Anterior).  APGAR: 9, 9; weight pending.   Placenta status: Spontaneous, Intact.  Cord: 3 vessels with the following complications: None.  Anesthesia: Epidural Episiotomy: None Lacerations: None Suture Repair: none Est. Blood Loss (mL): 100  Mom to postpartum.  Baby to Couplet care / Skin to Skin.  She desires BTL tomorrow morning, will schedule with Dr. Terri Piedra.  Will continue her Labetalol and Amlodipine as well as lasix prn  Blane Ohara Lesia Monica 05/10/2020, 7:39 AM

## 2019-10-12 ENCOUNTER — Ambulatory Visit (HOSPITAL_COMMUNITY)
Admission: RE | Admit: 2019-10-12 | Discharge: 2019-10-12 | Disposition: A | Discharge status: Home / Self Care | Payer: Medicare HMO | Source: Ambulatory Visit

## 2019-10-12 ENCOUNTER — Ambulatory Visit (INDEPENDENT_AMBULATORY_CARE_PROVIDER_SITE_OTHER): Payer: Medicare HMO | Admitting: *Deleted

## 2019-10-12 ENCOUNTER — Other Ambulatory Visit: Payer: Self-pay

## 2019-10-12 DIAGNOSIS — Z3490 Encounter for supervision of normal pregnancy, unspecified, unspecified trimester: Secondary | ICD-10-CM

## 2019-10-12 DIAGNOSIS — Z3A01 Less than 8 weeks gestation of pregnancy: Secondary | ICD-10-CM | POA: Diagnosis not present

## 2019-10-12 DIAGNOSIS — O3411 Maternal care for benign tumor of corpus uteri, first trimester: Secondary | ICD-10-CM | POA: Diagnosis not present

## 2019-10-12 DIAGNOSIS — O3680X0 Pregnancy with inconclusive fetal viability, not applicable or unspecified: Secondary | ICD-10-CM

## 2019-10-12 NOTE — Progress Notes (Addendum)
Pt here for u/s results. Discussed with Butch Penny NP and growth today is appropriate making EDC of 05/24/20. Pt states she is doing well and had appt with cardiologist on 12/28. Dr Kallie Locks. They have adjusted her meds for pregnancy and pt is doing well.  Aware she has uterine fibroid which may cause alittle more cramping. Stay well hydrated and call cardiologist with any cardiac concerns. To make appt to begin [facility] in next 2 wks. PT voices understanding and agrees with POC.   Chart reviewed for nurse visit. Agree with plan of care.   Virginia Rochester, NP 10/12/2019 3:32 PM

## 2019-10-14 DIAGNOSIS — G4733 Obstructive sleep apnea (adult) (pediatric): Secondary | ICD-10-CM | POA: Diagnosis not present

## 2019-10-20 ENCOUNTER — Encounter (HOSPITAL_COMMUNITY): Payer: Self-pay | Admitting: Physician Assistant

## 2019-10-20 ENCOUNTER — Other Ambulatory Visit: Payer: Self-pay

## 2019-10-20 ENCOUNTER — Encounter (HOSPITAL_BASED_OUTPATIENT_CLINIC_OR_DEPARTMENT_OTHER): Payer: Self-pay | Admitting: Obstetrics and Gynecology

## 2019-10-20 DIAGNOSIS — O26891 Other specified pregnancy related conditions, first trimester: Secondary | ICD-10-CM | POA: Diagnosis not present

## 2019-10-20 DIAGNOSIS — Z3A09 9 weeks gestation of pregnancy: Secondary | ICD-10-CM | POA: Diagnosis not present

## 2019-10-20 DIAGNOSIS — O10011 Pre-existing essential hypertension complicating pregnancy, first trimester: Secondary | ICD-10-CM | POA: Diagnosis not present

## 2019-10-20 NOTE — Progress Notes (Addendum)
ADDENDUM:  Chart reviewed by anesthesia, Konrad Felix PA,  Ok to proceed.   Spoke w/ via phone for pre-op interview---  PT Lab needs dos---- Istat 8             Lab results------  Current ekg/ cxr in chart/ epic COVID test ------ 10-21-2019 @ [id number] Arrive at ------- 0600 NPO after ------ MN Medications to take morning of surgery ----- Labetolol, Norvasc, Advair inhaler w/ sips of water Diabetic medication ----- n/a Patient Special Instructions ----- asked to bring cpap mask/ tubing and rescue inhaler Pre-Op special Istructions ----- n/a Patient verbalized understanding of instructions that were given at this phone interview. Patient denies shortness of breath, chest pain, fever, cough a this phone interview.   Anesthesia Review:  Hx HTN,  OSA w/ CPAP (states wears every night),  Hx acute hear failure post partum 09/ 2018.  Bipolar  PCP:  Dr Iona Beard Osei-Bonsu Cardiologist :  Dr Liane Comber (lov 04-20-2019 epic) Pulmonology:  Peri Jefferson FNP , [facility] in [place] [phone] 09-09-2019 epic) Chest x-ray : 08-17-2019 epic EKG : 08-17-2019 epic Echo : 06-17-2017 epic Stress test:  no Cardiac Cath :   no Sleep Study/ CPAP :  YES/ yes   Blood Thinner/ Instructions Maryjane Hurter Dose: NO ASA / Instructions/ Last Dose :  NO

## 2019-10-21 ENCOUNTER — Telehealth: Payer: Self-pay | Admitting: Cardiology

## 2019-10-21 ENCOUNTER — Inpatient Hospital Stay (HOSPITAL_COMMUNITY): Admission: RE | Admit: 2019-10-21 | Payer: Medicare HMO | Source: Ambulatory Visit

## 2019-10-21 NOTE — Telephone Encounter (Signed)
New message:    Patient calling stating she needs a second opion concering her heart. Please call patient.

## 2019-10-21 NOTE — Telephone Encounter (Signed)
Pt calling in to just inform [name] that she is currently pregnant,  around 7-9 weeks, and her OBGYN advised her that given her current health status, past pregnancies with complications, and cardiac history, that it is NOT advisable that she proceed with this pregnancy, for it would place her in great danger.  Pt states she was advised to terminate the pregnancy, given the extreme risk it would put her life in, if she proceeds.  Pt states her OBGYN advised her she needed to have a D&C done asap, if she proceeds with termination.  Pt states she is aware of the risk she would be taking if she proceeded with this pregnancy, but also doesn't want to terminate her baby.  Pt states her OBGYN stated her heart would not be able to withstand another pregnancy, as well as she would put her baby at risk. Pt states she is thinking of getting a quick 2nd opinion with another OBGYN, to discuss this matter.   Pt wanted to let [name] to know this, and see if she had any thoughts from a cardiac perspective.  Informed the pt that [name] is out of the office the rest of this week, but I will route this message to her, for further review and recommendation, and follow-up with the pt thereafter.  Best wishes provided to the pt during this difficult time.   Pt verbalized understanding and agrees with this plan.

## 2019-10-22 ENCOUNTER — Telehealth: Payer: Self-pay | Admitting: Cardiology

## 2019-10-22 ENCOUNTER — Other Ambulatory Visit (HOSPITAL_COMMUNITY)
Admission: RE | Admit: 2019-10-22 | Discharge: 2019-10-22 | Disposition: A | Discharge status: Home / Self Care | Payer: Medicare HMO | Source: Ambulatory Visit | Attending: Obstetrics and Gynecology | Admitting: Obstetrics and Gynecology

## 2019-10-22 ENCOUNTER — Encounter (HOSPITAL_COMMUNITY): Payer: Self-pay | Admitting: Obstetrics and Gynecology

## 2019-10-22 ENCOUNTER — Other Ambulatory Visit: Payer: Self-pay

## 2019-10-22 DIAGNOSIS — Z01812 Encounter for preprocedural laboratory examination: Secondary | ICD-10-CM | POA: Insufficient documentation

## 2019-10-22 DIAGNOSIS — Z20822 Contact with and (suspected) exposure to covid-19: Secondary | ICD-10-CM | POA: Insufficient documentation

## 2019-10-22 LAB — SARS CORONAVIRUS 2 (TAT 6-24 HRS): SARS Coronavirus 2: NEGATIVE

## 2019-10-22 MED ORDER — SODIUM CHLORIDE 0.9 % IV SOLN
100.0000 mg | INTRAVENOUS | Status: AC
  Filled 2019-10-22: qty 100

## 2019-10-22 NOTE — Telephone Encounter (Signed)
New Message    [name] is calling from [facility] to speak with [name] about the patients procedure that is scheduled tomorrow  She states she wants to discuss a telephone note from [facility]     Please advise

## 2019-10-22 NOTE — Telephone Encounter (Signed)
Spoke with Jan at [facility] about call pt placed to our office yesterday.  Per Jan, pt is scheduled to come in for covid testing today and D &C tomorrow.

## 2019-10-22 NOTE — Progress Notes (Signed)
Mrs [name] denies chest pain or shortness of breath. Mrs. [name] said she really does not want to have procedure, but she plans on coming in tomorrow. Patient had Covid test this afternoon and is quaranting with her family.  Mrs [name] has sleep apnea and uses CPAP.

## 2019-10-23 ENCOUNTER — Encounter (HOSPITAL_COMMUNITY): Payer: Self-pay | Admitting: Obstetrics and Gynecology

## 2019-10-23 ENCOUNTER — Ambulatory Visit (HOSPITAL_COMMUNITY)
Admission: RE | Admit: 2019-10-23 | Payer: Medicare HMO | Source: Home / Self Care | Admitting: Obstetrics and Gynecology

## 2019-10-23 HISTORY — DX: Unspecified osteoarthritis, unspecified site: M19.90

## 2019-10-23 HISTORY — DX: Mild intermittent asthma, uncomplicated: J45.20

## 2019-10-23 HISTORY — DX: Obstructive sleep apnea (adult) (pediatric): G47.33

## 2019-10-23 HISTORY — DX: Prediabetes: R73.03

## 2019-10-23 HISTORY — DX: Leiomyoma of uterus, unspecified: D25.9

## 2019-10-23 HISTORY — DX: Gastro-esophageal reflux disease without esophagitis: K21.9

## 2019-10-23 HISTORY — DX: Obstructive sleep apnea (adult) (pediatric): Z99.89

## 2019-10-23 SURGERY — DILATION AND EVACUATION, UTERUS
Anesthesia: Choice

## 2019-10-23 MED ORDER — FENTANYL CITRATE (PF) 250 MCG/5ML IJ SOLN
INTRAMUSCULAR | Status: AC
  Filled 2019-10-23: qty 5

## 2019-10-23 MED ORDER — MIDAZOLAM HCL 2 MG/2ML IJ SOLN
INTRAMUSCULAR | Status: AC
  Filled 2019-10-23: qty 2
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
## 2019-10-23 NOTE — Anesthesia Preprocedure Evaluation (Deleted)
Anesthesia Evaluation    Airway       Dental   Pulmonary former smoker,          Cardiovascular hypertension,     Neuro/Psych    GI/Hepatic   Endo/Other    Renal/GU      Musculoskeletal   Abdominal   Peds  Hematology   Anesthesia Other Findings   Reproductive/Obstetrics                          Anesthesia Physical Anesthesia Plan Anesthesia Quick Evaluation  

## 2019-10-26 NOTE — Telephone Encounter (Signed)
Mrs Laurelee Ibsen is currently under my care for hypertension, obesity and lower extremity edema, and is being considered a low risk for an intermediate risk surgery.  There is currently no contraindication for this patient to undergo planned procedure.  Please call me with any questions.  Sincerely,  Ena Dawley, MD

## 2019-10-27 ENCOUNTER — Encounter: Payer: Self-pay | Admitting: *Deleted

## 2019-10-27 DIAGNOSIS — G4733 Obstructive sleep apnea (adult) (pediatric): Secondary | ICD-10-CM | POA: Diagnosis not present

## 2019-10-27 NOTE — Progress Notes (Signed)
Letter created and sent to the pt via mychart and to her OBGYN Dr. Willis Modena via epic fax function, with Dr. [name] recommendations.

## 2019-10-27 NOTE — Telephone Encounter (Signed)
Letter created and sent to the pt via mychart and to her OBGYN Dr. Willis Modena via epic fax function, with Dr. [name] recommendations Pt is aware of letter and recommendations per [name].  Pt states she did not proceed with scheduled D&C on 1/15, and she will be going in to see her OBGYN Dr. Willis Modena, today at 4:15 pm.  Pt states she is going to discuss with him about the risk involved, if she chooses to proceed with her pregnancy.  Advised the pt to keep [place] updated on how her appt went, and have her OBGYN reach out to [name] as needed, if he would like for her to have further cardiac work-up done.  Pt verbalized understanding and agrees with this plan.  Pt states she will keep [place] updated with how her visit with her OBGYN went today.

## 2019-10-28 NOTE — Telephone Encounter (Signed)
Dr. Willis Modena OBGYN is calling to speak with [name].  He left his number for her to call back and discuss mutual pt.  Number to call back is [phone].

## 2019-11-02 DIAGNOSIS — Z113 Encounter for screening for infections with a predominantly sexual mode of transmission: Secondary | ICD-10-CM | POA: Diagnosis not present

## 2019-11-02 DIAGNOSIS — O99211 Obesity complicating pregnancy, first trimester: Secondary | ICD-10-CM | POA: Diagnosis not present

## 2019-11-02 DIAGNOSIS — O10011 Pre-existing essential hypertension complicating pregnancy, first trimester: Secondary | ICD-10-CM | POA: Diagnosis not present

## 2019-11-02 DIAGNOSIS — Z1272 Encounter for screening for malignant neoplasm of vagina: Secondary | ICD-10-CM | POA: Diagnosis not present

## 2019-11-02 DIAGNOSIS — Z3689 Encounter for other specified antenatal screening: Secondary | ICD-10-CM | POA: Diagnosis not present

## 2019-11-02 DIAGNOSIS — Z1151 Encounter for screening for human papillomavirus (HPV): Secondary | ICD-10-CM | POA: Diagnosis not present

## 2019-11-02 DIAGNOSIS — Z3A10 10 weeks gestation of pregnancy: Secondary | ICD-10-CM | POA: Diagnosis not present

## 2019-11-03 DIAGNOSIS — Z1272 Encounter for screening for malignant neoplasm of vagina: Secondary | ICD-10-CM | POA: Diagnosis not present

## 2019-11-03 DIAGNOSIS — Z1151 Encounter for screening for human papillomavirus (HPV): Secondary | ICD-10-CM | POA: Diagnosis not present

## 2019-11-12 DIAGNOSIS — Z3481 Encounter for supervision of other normal pregnancy, first trimester: Secondary | ICD-10-CM | POA: Diagnosis not present

## 2019-11-12 DIAGNOSIS — Z3482 Encounter for supervision of other normal pregnancy, second trimester: Secondary | ICD-10-CM | POA: Diagnosis not present

## 2019-11-12 DIAGNOSIS — Z3A12 12 weeks gestation of pregnancy: Secondary | ICD-10-CM | POA: Diagnosis not present

## 2019-11-13 ENCOUNTER — Ambulatory Visit (INDEPENDENT_AMBULATORY_CARE_PROVIDER_SITE_OTHER): Payer: Medicare HMO | Admitting: Cardiology

## 2019-11-13 ENCOUNTER — Other Ambulatory Visit: Payer: Self-pay

## 2019-11-13 ENCOUNTER — Encounter: Payer: Self-pay | Admitting: Cardiology

## 2019-11-13 ENCOUNTER — Telehealth: Payer: Self-pay | Admitting: Licensed Clinical Social Worker

## 2019-11-13 VITALS — BP 134/82 | HR 78 | Ht 65.0 in | Wt 298.0 lb

## 2019-11-13 DIAGNOSIS — Z9189 Other specified personal risk factors, not elsewhere classified: Secondary | ICD-10-CM

## 2019-11-13 DIAGNOSIS — R600 Localized edema: Secondary | ICD-10-CM | POA: Diagnosis not present

## 2019-11-13 DIAGNOSIS — I5032 Chronic diastolic (congestive) heart failure: Secondary | ICD-10-CM | POA: Diagnosis not present

## 2019-11-13 DIAGNOSIS — I10 Essential (primary) hypertension: Secondary | ICD-10-CM

## 2019-11-13 DIAGNOSIS — Z3A12 12 weeks gestation of pregnancy: Secondary | ICD-10-CM

## 2019-11-13 DIAGNOSIS — Z3490 Encounter for supervision of normal pregnancy, unspecified, unspecified trimester: Secondary | ICD-10-CM | POA: Diagnosis not present

## 2019-11-13 NOTE — Patient Instructions (Signed)
Medication Instructions:   Your physician recommends that you continue on your current medications as directed. Please refer to the Current Medication list given to you today.  *If you need a refill on your cardiac medications before your next appointment, please call your pharmacy*    Testing/Procedures:  Your physician has requested that you have an echocardiogram. Echocardiography is a painless test that uses sound waves to create images of your heart. It provides your doctor with information about the size and shape of your heart and how well your heart's chambers and valves are working. This procedure takes approximately one hour. There are no restrictions for this procedure.   Follow-Up:  3 months with [name] in the office   Other Instructions  We have sent a message to our Social Worker Bridgette Habermann to mail you a free BP cuff to your home, so you can closely monitor this.
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
## 2019-11-13 NOTE — Telephone Encounter (Signed)
CSW referred to assist patient with obtaining a BP cuff. CSW contacted patient to inform cuff will be delivered to home. Patient grateful for support and assistance. CSW available as needed. Jackie Laporcha Marchesi, LCSW, CCSW-MCS [phone]  

## 2019-11-27 ENCOUNTER — Other Ambulatory Visit (HOSPITAL_COMMUNITY): Payer: Medicare HMO

## 2019-11-27 DIAGNOSIS — G4733 Obstructive sleep apnea (adult) (pediatric): Secondary | ICD-10-CM | POA: Diagnosis not present

## 2019-12-07 ENCOUNTER — Telehealth: Payer: Self-pay | Admitting: Cardiology

## 2019-12-07 ENCOUNTER — Ambulatory Visit (HOSPITAL_COMMUNITY): Payer: Medicare HMO | Attending: Cardiology

## 2019-12-07 ENCOUNTER — Other Ambulatory Visit: Payer: Self-pay

## 2019-12-07 DIAGNOSIS — Z3490 Encounter for supervision of normal pregnancy, unspecified, unspecified trimester: Secondary | ICD-10-CM | POA: Insufficient documentation

## 2019-12-07 DIAGNOSIS — I5032 Chronic diastolic (congestive) heart failure: Secondary | ICD-10-CM | POA: Insufficient documentation

## 2019-12-07 DIAGNOSIS — I10 Essential (primary) hypertension: Secondary | ICD-10-CM | POA: Insufficient documentation

## 2019-12-07 MED ORDER — LABETALOL HCL 200 MG PO TABS: 200.0000 mg | ORAL_TABLET | Freq: Two times a day (BID) | ORAL | 11 refills | Status: DC

## 2019-12-07 NOTE — Telephone Encounter (Signed)
Refill request sent to [facility] [address]

## 2019-12-07 NOTE — Telephone Encounter (Signed)
New message      *STAT* If patient is at the pharmacy, call can be transferred to refill team.   1. Which medications need to be refilled? (please list name of each medication and dose if known) Labetalol 200mg   2. Which pharmacy/location (including street and city if local pharmacy) is medication to be sent to? [facility] at [address]  3. Do they need a 30 day or 90 day supply? 30 day-----PT STATES THAT SHE IS OUT OF MEDICATION

## 2019-12-14 DIAGNOSIS — R1013 Epigastric pain: Secondary | ICD-10-CM | POA: Diagnosis not present

## 2019-12-14 DIAGNOSIS — J302 Other seasonal allergic rhinitis: Secondary | ICD-10-CM | POA: Diagnosis not present

## 2019-12-14 DIAGNOSIS — Z131 Encounter for screening for diabetes mellitus: Secondary | ICD-10-CM | POA: Diagnosis not present

## 2019-12-14 DIAGNOSIS — E782 Mixed hyperlipidemia: Secondary | ICD-10-CM | POA: Diagnosis not present

## 2019-12-14 DIAGNOSIS — F3341 Major depressive disorder, recurrent, in partial remission: Secondary | ICD-10-CM | POA: Diagnosis not present

## 2019-12-14 DIAGNOSIS — I10 Essential (primary) hypertension: Secondary | ICD-10-CM | POA: Diagnosis not present

## 2019-12-14 DIAGNOSIS — Z720 Tobacco use: Secondary | ICD-10-CM | POA: Diagnosis not present

## 2019-12-14 DIAGNOSIS — J45909 Unspecified asthma, uncomplicated: Secondary | ICD-10-CM | POA: Diagnosis not present

## 2019-12-14 DIAGNOSIS — R7303 Prediabetes: Secondary | ICD-10-CM | POA: Diagnosis not present

## 2019-12-25 DIAGNOSIS — G4733 Obstructive sleep apnea (adult) (pediatric): Secondary | ICD-10-CM | POA: Diagnosis not present

## 2019-12-29 DIAGNOSIS — Z3A19 19 weeks gestation of pregnancy: Secondary | ICD-10-CM | POA: Diagnosis not present

## 2019-12-29 DIAGNOSIS — Z363 Encounter for antenatal screening for malformations: Secondary | ICD-10-CM | POA: Diagnosis not present

## 2020-01-12 ENCOUNTER — Telehealth: Payer: Self-pay | Admitting: Cardiology

## 2020-01-12 MED ORDER — LABETALOL HCL 200 MG PO TABS: 200.0000 mg | ORAL_TABLET | Freq: Two times a day (BID) | ORAL | 0 refills | Status: DC

## 2020-01-12 NOTE — Telephone Encounter (Signed)
She can take extra 200 mg of labetalol

## 2020-01-12 NOTE — Telephone Encounter (Signed)
New Message    Pt c/o BP issue: STAT if pt c/o blurred vision, one-sided weakness or slurred speech  1. What are your last 5 BP readings? 149/90 this morning   2. Are you having any other symptoms (ex. Dizziness, headache, blurred vision, passed out)? Headache   3. What is your BP issue? Pt is calling and says she is pregnant and Dr Meda Coffee advised her to call if she has elevated BP    Please advise

## 2020-01-12 NOTE — Telephone Encounter (Signed)
Clarified dosage with [name] in regards to the pts labetalol. Per [name], she wants the pt to continue taking Labetalol 200 mg po bid, and she make take 1 extra labetalol 200 mg by mouth as needed, for systolic BP > [id number].  Confirmed the pharmacy of choice with the pt.  Pt advised to continue to monitor.  Pt education provided.  Pt verbalized understanding and agrees with this plan.

## 2020-01-12 NOTE — Telephone Encounter (Signed)
[name] please review this message.  Pt calling to stated her BP this morning was 149/90 and she has a headache.  Pt states she was told by you to call and give an update when her BP gets elevated, being she is pregnant. Please advise!

## 2020-01-13 DIAGNOSIS — G4733 Obstructive sleep apnea (adult) (pediatric): Secondary | ICD-10-CM | POA: Diagnosis not present

## 2020-01-17 ENCOUNTER — Inpatient Hospital Stay (HOSPITAL_COMMUNITY)
Admission: AD | Admit: 2020-01-17 | Discharge: 2020-01-17 | Disposition: A | Discharge status: Home / Self Care | Payer: Medicare HMO | Attending: Obstetrics and Gynecology | Admitting: Obstetrics and Gynecology

## 2020-01-17 ENCOUNTER — Encounter (HOSPITAL_COMMUNITY): Payer: Self-pay | Admitting: Obstetrics & Gynecology

## 2020-01-17 ENCOUNTER — Other Ambulatory Visit: Payer: Self-pay

## 2020-01-17 ENCOUNTER — Inpatient Hospital Stay (HOSPITAL_BASED_OUTPATIENT_CLINIC_OR_DEPARTMENT_OTHER)
Admit: 2020-01-17 | Discharge: 2020-01-17 | Disposition: A | Discharge status: Home / Self Care | Payer: Medicare HMO | Attending: Student | Admitting: Student

## 2020-01-17 DIAGNOSIS — O10012 Pre-existing essential hypertension complicating pregnancy, second trimester: Secondary | ICD-10-CM | POA: Diagnosis not present

## 2020-01-17 DIAGNOSIS — Z3A21 21 weeks gestation of pregnancy: Secondary | ICD-10-CM | POA: Insufficient documentation

## 2020-01-17 DIAGNOSIS — O99891 Other specified diseases and conditions complicating pregnancy: Secondary | ICD-10-CM | POA: Diagnosis not present

## 2020-01-17 DIAGNOSIS — M179 Osteoarthritis of knee, unspecified: Secondary | ICD-10-CM

## 2020-01-17 DIAGNOSIS — O26892 Other specified pregnancy related conditions, second trimester: Secondary | ICD-10-CM | POA: Diagnosis not present

## 2020-01-17 DIAGNOSIS — O1202 Gestational edema, second trimester: Secondary | ICD-10-CM | POA: Diagnosis not present

## 2020-01-17 DIAGNOSIS — Z87891 Personal history of nicotine dependence: Secondary | ICD-10-CM | POA: Diagnosis not present

## 2020-01-17 DIAGNOSIS — M79661 Pain in right lower leg: Secondary | ICD-10-CM | POA: Diagnosis not present

## 2020-01-17 DIAGNOSIS — M79609 Pain in unspecified limb: Secondary | ICD-10-CM

## 2020-01-17 DIAGNOSIS — Z3A22 22 weeks gestation of pregnancy: Secondary | ICD-10-CM

## 2020-01-17 NOTE — Progress Notes (Signed)
Right lower extremity venous duplex has been completed. Preliminary results can be found in CV Proc through chart review.  Results were given to the patient's nurse, Janett Billow.  01/17/20 1:32 PM [name] RVT
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
## 2020-01-17 NOTE — MAU Note (Addendum)
Pt reports to mau with c/o of swelling in her right leg for the past 2 days. Pt reports some pain of the same leg.  Pt denies any vag bleeding, LOF, or other pregnacy concerns.  Pt calf muscles measure equally in triage at 21 in each.  FHR 145 by doppler

## 2020-01-17 NOTE — Discharge Instructions (Signed)
Follow up with your ob/gyn & cardiologist as scheduled.  For any concerning symptoms such as chest pain, shortness of breath, or cough, please present to the closest emergency department. Return to MAU for any obstetrical emergencies.

## 2020-01-25 DIAGNOSIS — G4733 Obstructive sleep apnea (adult) (pediatric): Secondary | ICD-10-CM | POA: Diagnosis not present

## 2020-02-14 ENCOUNTER — Other Ambulatory Visit: Payer: Self-pay | Admitting: Cardiology

## 2020-02-18 ENCOUNTER — Ambulatory Visit (INDEPENDENT_AMBULATORY_CARE_PROVIDER_SITE_OTHER): Payer: Medicare HMO | Admitting: Cardiology

## 2020-02-18 ENCOUNTER — Other Ambulatory Visit: Payer: Self-pay

## 2020-02-18 ENCOUNTER — Encounter: Payer: Self-pay | Admitting: Cardiology

## 2020-02-18 VITALS — BP 124/82 | HR 80 | Ht 65.0 in | Wt 303.0 lb

## 2020-02-18 DIAGNOSIS — I110 Hypertensive heart disease with heart failure: Secondary | ICD-10-CM | POA: Diagnosis not present

## 2020-02-18 NOTE — Patient Instructions (Signed)
Medication Instructions:   Your physician recommends that you continue on your current medications as directed. Please refer to the Current Medication list given to you today.  *If you need a refill on your cardiac medications before your next appointment, please call your pharmacy*   Follow-Up:  WITH [name] IN THE OFFICE ON April 21, 2020 AT 8:40 AM--IN PERSON VISIT
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
## 2020-02-24 DIAGNOSIS — Z3689 Encounter for other specified antenatal screening: Secondary | ICD-10-CM | POA: Diagnosis not present

## 2020-02-24 DIAGNOSIS — G4733 Obstructive sleep apnea (adult) (pediatric): Secondary | ICD-10-CM | POA: Diagnosis not present

## 2020-03-14 DIAGNOSIS — O10013 Pre-existing essential hypertension complicating pregnancy, third trimester: Secondary | ICD-10-CM | POA: Diagnosis not present

## 2020-03-14 DIAGNOSIS — O3663X0 Maternal care for excessive fetal growth, third trimester, not applicable or unspecified: Secondary | ICD-10-CM | POA: Diagnosis not present

## 2020-03-14 DIAGNOSIS — O99213 Obesity complicating pregnancy, third trimester: Secondary | ICD-10-CM | POA: Diagnosis not present

## 2020-03-14 DIAGNOSIS — Z23 Encounter for immunization: Secondary | ICD-10-CM | POA: Diagnosis not present

## 2020-03-14 DIAGNOSIS — Z3A29 29 weeks gestation of pregnancy: Secondary | ICD-10-CM | POA: Diagnosis not present

## 2020-03-23 DIAGNOSIS — Z629 Problem related to upbringing, unspecified: Secondary | ICD-10-CM | POA: Diagnosis not present

## 2020-03-23 DIAGNOSIS — F4323 Adjustment disorder with mixed anxiety and depressed mood: Secondary | ICD-10-CM | POA: Diagnosis not present

## 2020-03-26 DIAGNOSIS — G4733 Obstructive sleep apnea (adult) (pediatric): Secondary | ICD-10-CM | POA: Diagnosis not present

## 2020-03-29 DIAGNOSIS — Z3A32 32 weeks gestation of pregnancy: Secondary | ICD-10-CM | POA: Diagnosis not present

## 2020-03-29 DIAGNOSIS — O10913 Unspecified pre-existing hypertension complicating pregnancy, third trimester: Secondary | ICD-10-CM | POA: Diagnosis not present

## 2020-03-30 DIAGNOSIS — F4323 Adjustment disorder with mixed anxiety and depressed mood: Secondary | ICD-10-CM | POA: Diagnosis not present

## 2020-03-30 DIAGNOSIS — Z629 Problem related to upbringing, unspecified: Secondary | ICD-10-CM | POA: Diagnosis not present

## 2020-04-01 ENCOUNTER — Other Ambulatory Visit: Payer: Self-pay

## 2020-04-01 DIAGNOSIS — O10013 Pre-existing essential hypertension complicating pregnancy, third trimester: Secondary | ICD-10-CM | POA: Diagnosis not present

## 2020-04-01 DIAGNOSIS — Z3A32 32 weeks gestation of pregnancy: Secondary | ICD-10-CM | POA: Diagnosis not present

## 2020-04-01 MED ORDER — FUROSEMIDE 40 MG PO TABS: 40.0000 mg | ORAL_TABLET | Freq: Every day | ORAL | 3 refills | Status: DC

## 2020-04-01 MED ORDER — LABETALOL HCL 200 MG PO TABS: 200.0000 mg | ORAL_TABLET | Freq: Two times a day (BID) | ORAL | 3 refills | Status: DC

## 2020-04-01 MED ORDER — AMLODIPINE BESYLATE 10 MG PO TABS: 10.0000 mg | ORAL_TABLET | Freq: Every day | ORAL | 3 refills | Status: DC

## 2020-04-05 DIAGNOSIS — Z3A33 33 weeks gestation of pregnancy: Secondary | ICD-10-CM | POA: Diagnosis not present

## 2020-04-05 DIAGNOSIS — O10913 Unspecified pre-existing hypertension complicating pregnancy, third trimester: Secondary | ICD-10-CM | POA: Diagnosis not present

## 2020-04-08 DIAGNOSIS — Z3A33 33 weeks gestation of pregnancy: Secondary | ICD-10-CM | POA: Diagnosis not present

## 2020-04-08 DIAGNOSIS — O10013 Pre-existing essential hypertension complicating pregnancy, third trimester: Secondary | ICD-10-CM | POA: Diagnosis not present

## 2020-04-12 DIAGNOSIS — Z3A34 34 weeks gestation of pregnancy: Secondary | ICD-10-CM | POA: Diagnosis not present

## 2020-04-12 DIAGNOSIS — O10913 Unspecified pre-existing hypertension complicating pregnancy, third trimester: Secondary | ICD-10-CM | POA: Diagnosis not present

## 2020-04-13 ENCOUNTER — Telehealth: Payer: Self-pay | Admitting: Cardiology

## 2020-04-13 MED ORDER — FUROSEMIDE 40 MG PO TABS: 40.0000 mg | ORAL_TABLET | Freq: Every day | ORAL | 3 refills | Status: DC | PRN

## 2020-04-13 MED ORDER — FUROSEMIDE 40 MG PO TABS: 40.0000 mg | ORAL_TABLET | Freq: Every day | ORAL | 0 refills | Status: DC

## 2020-04-13 NOTE — Telephone Encounter (Signed)
She can take lasix 40 mg po daily and keep [place] posted about her LE edema and SOB

## 2020-04-13 NOTE — Telephone Encounter (Signed)
Spoke with the pt and informed her that per [name], she said she can increase her lasix back to 40 mg po daily, and keep [place] posted about her LEE and SOB.  Informed the pt we will see her as planned next week. Pt states she does not need refills of this medication at this time, for she has enough on hand.  Pt verbalized understanding and agrees with this plan.

## 2020-04-13 NOTE — Telephone Encounter (Signed)
Information provided by operator in this message is inaccurate complaints from the pt.   Spoke with the pt and she states she is calling in today to give [name] an update, and ask about her taking lasix while being pregnant. Pt states she is now measuring at [redacted] weeks pregnant, and baby is doing well.  Pt states she saw her OBGYN this morning for check up, and her BP- 139/78 and HR-86.  Pt states her weight now is 311 lbs, which she states her OBGYN said was appropriate baby weight gain.  Pt states she has some lower extremity edema, and mild sob at times, and her OBGYN was not concerned about this today, being she reports they said her weight and V/S were within acceptable limits.   Pt states her OBGYN advised her to wear her compression stockings, elevate her extremities at rest, and avoid salt to help with the lower extremity edema.  Pt states [name] said she could use her lasix 40 mg po daily PRN for LEE, at last OV, and this is confirmed in Dr. [name] note from then. Pt wants [name] to advise if she is still ok to use her lasix 40 mg as needed for edema, and see if she has any other recommendations to help with this, besides what her OBGYN advised.  Pt states she is in no acute distress and has no other cardiac complaints at this time.  Pt is scheduled to see [name] in clinic on next Thursday 7/15 at [facility].  Advised her to keep this appt. Advised the pt to follow all recommendations as outlined by her OBGYN at today's visit.  Informed the pt that [name] is out of the office at this time, but I will route this message to her for further review and recommendation.  Informed the pt that I will follow-up with her thereafter, if further recommendations are endorsed.  Advised the pt if her symptoms worsen, then she should seek immediate medical care.  Pt education provided on what S/S warrant immediate medical attention.  Pt verbalized understanding and agrees with this plan.

## 2020-04-13 NOTE — Telephone Encounter (Signed)
New message  Pt c/o swelling: STAT is pt has developed SOB within 24 hours  1) How much weight have you gained and in what time span?patient states that she has gained 5lbs in a week   2) If swelling, where is the swelling located? Top of both feet and ankles   3) Are you currently taking a fluid pill? Yes   4) Are you currently SOB? Yes, has had sob within last 24 hours   Do you have a log of your daily weights (if so, list)? yes  5) Have you gained 3 pounds in a day or 5 pounds in a week? yes  6) Have you traveled recently? yes

## 2020-04-15 DIAGNOSIS — O10913 Unspecified pre-existing hypertension complicating pregnancy, third trimester: Secondary | ICD-10-CM | POA: Diagnosis not present

## 2020-04-15 DIAGNOSIS — Z3A34 34 weeks gestation of pregnancy: Secondary | ICD-10-CM | POA: Diagnosis not present

## 2020-04-15 DIAGNOSIS — G4733 Obstructive sleep apnea (adult) (pediatric): Secondary | ICD-10-CM | POA: Diagnosis not present

## 2020-04-18 ENCOUNTER — Other Ambulatory Visit: Payer: Self-pay

## 2020-04-18 MED ORDER — LABETALOL HCL 200 MG PO TABS: 200.0000 mg | ORAL_TABLET | Freq: Two times a day (BID) | ORAL | 2 refills | Status: DC

## 2020-04-19 DIAGNOSIS — O10019 Pre-existing essential hypertension complicating pregnancy, unspecified trimester: Secondary | ICD-10-CM | POA: Diagnosis not present

## 2020-04-21 ENCOUNTER — Other Ambulatory Visit: Payer: Self-pay

## 2020-04-21 ENCOUNTER — Ambulatory Visit (INDEPENDENT_AMBULATORY_CARE_PROVIDER_SITE_OTHER): Payer: Medicare HMO | Admitting: Cardiology

## 2020-04-21 ENCOUNTER — Encounter: Payer: Self-pay | Admitting: Cardiology

## 2020-04-21 VITALS — BP 130/80 | HR 81 | Ht 65.0 in | Wt 315.0 lb

## 2020-04-21 DIAGNOSIS — R600 Localized edema: Secondary | ICD-10-CM

## 2020-04-21 DIAGNOSIS — O1495 Unspecified pre-eclampsia, complicating the puerperium: Secondary | ICD-10-CM

## 2020-04-21 DIAGNOSIS — Z3490 Encounter for supervision of normal pregnancy, unspecified, unspecified trimester: Secondary | ICD-10-CM

## 2020-04-21 DIAGNOSIS — I110 Hypertensive heart disease with heart failure: Secondary | ICD-10-CM

## 2020-04-21 DIAGNOSIS — I5032 Chronic diastolic (congestive) heart failure: Secondary | ICD-10-CM

## 2020-04-21 MED ORDER — LABETALOL HCL 200 MG PO TABS: 400.0000 mg | ORAL_TABLET | Freq: Two times a day (BID) | ORAL | 1 refills | Status: DC

## 2020-04-21 NOTE — Patient Instructions (Addendum)
Medication Instructions:   INCREASE YOUR LABETALOL TO 400 MG BY MOUTH TWICE DAILY  *If you need a refill on your cardiac medications before your next appointment, please call your pharmacy*   Follow-Up:  [name] WOULD LIKE TO SEE YOU BACK SOMETIME IN September 2021--YOU CAN ADD TO ANY OPEN SLOT TO DR. [name] SCHEDULE
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
## 2020-04-23 ENCOUNTER — Other Ambulatory Visit: Payer: Self-pay | Admitting: Cardiology

## 2020-04-23 DIAGNOSIS — O99343 Other mental disorders complicating pregnancy, third trimester: Secondary | ICD-10-CM | POA: Diagnosis not present

## 2020-04-23 DIAGNOSIS — O98513 Other viral diseases complicating pregnancy, third trimester: Secondary | ICD-10-CM | POA: Diagnosis not present

## 2020-04-23 DIAGNOSIS — O4703 False labor before 37 completed weeks of gestation, third trimester: Secondary | ICD-10-CM | POA: Diagnosis not present

## 2020-04-23 DIAGNOSIS — O10913 Unspecified pre-existing hypertension complicating pregnancy, third trimester: Secondary | ICD-10-CM | POA: Diagnosis not present

## 2020-04-23 DIAGNOSIS — O99891 Other specified diseases and conditions complicating pregnancy: Secondary | ICD-10-CM | POA: Diagnosis not present

## 2020-04-23 DIAGNOSIS — K219 Gastro-esophageal reflux disease without esophagitis: Secondary | ICD-10-CM | POA: Diagnosis not present

## 2020-04-23 DIAGNOSIS — I429 Cardiomyopathy, unspecified: Secondary | ICD-10-CM | POA: Diagnosis not present

## 2020-04-23 DIAGNOSIS — O26893 Other specified pregnancy related conditions, third trimester: Secondary | ICD-10-CM | POA: Diagnosis not present

## 2020-04-23 DIAGNOSIS — O99613 Diseases of the digestive system complicating pregnancy, third trimester: Secondary | ICD-10-CM | POA: Diagnosis not present

## 2020-04-23 DIAGNOSIS — O99353 Diseases of the nervous system complicating pregnancy, third trimester: Secondary | ICD-10-CM | POA: Diagnosis not present

## 2020-04-23 DIAGNOSIS — M549 Dorsalgia, unspecified: Secondary | ICD-10-CM | POA: Diagnosis not present

## 2020-04-23 DIAGNOSIS — O99213 Obesity complicating pregnancy, third trimester: Secondary | ICD-10-CM | POA: Diagnosis not present

## 2020-04-23 DIAGNOSIS — O99513 Diseases of the respiratory system complicating pregnancy, third trimester: Secondary | ICD-10-CM | POA: Diagnosis not present

## 2020-04-23 DIAGNOSIS — O99413 Diseases of the circulatory system complicating pregnancy, third trimester: Secondary | ICD-10-CM | POA: Diagnosis not present

## 2020-04-23 DIAGNOSIS — Z0371 Encounter for suspected problem with amniotic cavity and membrane ruled out: Secondary | ICD-10-CM | POA: Diagnosis not present

## 2020-04-23 DIAGNOSIS — Z8759 Personal history of other complications of pregnancy, childbirth and the puerperium: Secondary | ICD-10-CM | POA: Diagnosis not present

## 2020-04-25 DIAGNOSIS — G4733 Obstructive sleep apnea (adult) (pediatric): Secondary | ICD-10-CM | POA: Diagnosis not present

## 2020-04-25 IMAGING — US US OB < 14 WEEKS - US OB TV
2 series · 15 of 28 positions shown · non-contrast
Comparison: None this pregnancy.

CLINICAL DATA: Pregnant patient in early first trimester pregnancy
with pregnancy of unknown location. Beta HCG 65.

EXAM:
OBSTETRIC <14 WK US AND TRANSVAGINAL OB US
TECHNIQUE: Both transabdominal and transvaginal ultrasound examinations were
performed for complete evaluation of the gestation as well as the
maternal uterus, adnexal regions, and pelvic cul-de-sac.
Transvaginal technique was performed to assess early pregnancy.

[Series 1: us ob < 14 weeks - us ob tv · 59 acquisitions, 14 frames shown (1 of 2)]
[im 1/59]
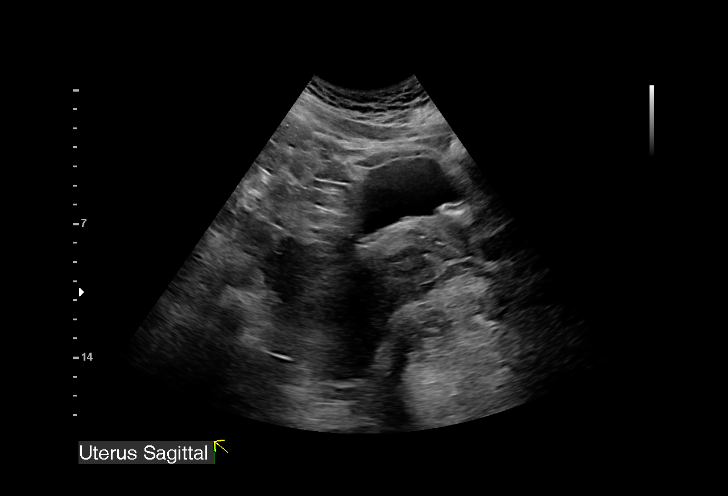
[im 5/59]
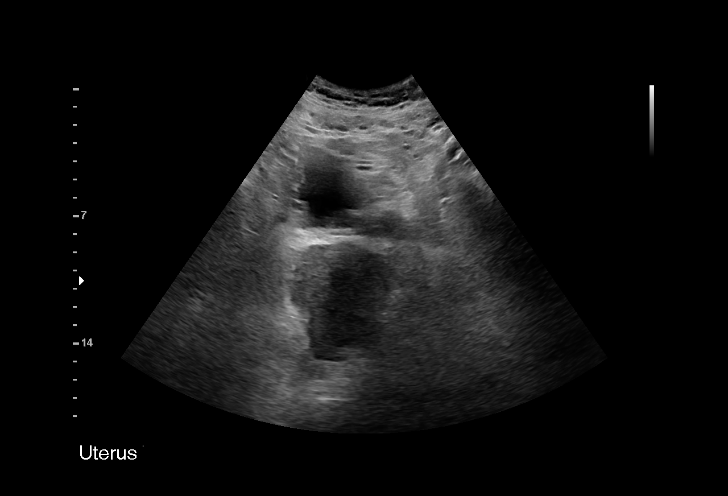
[im 9/59]
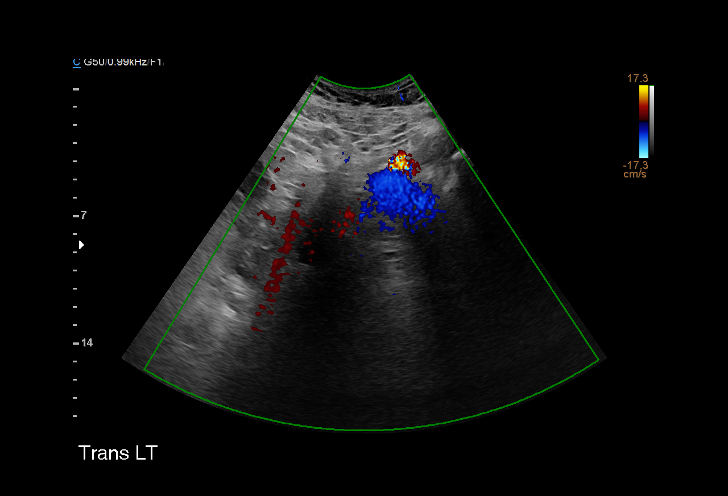
[im 14/59]
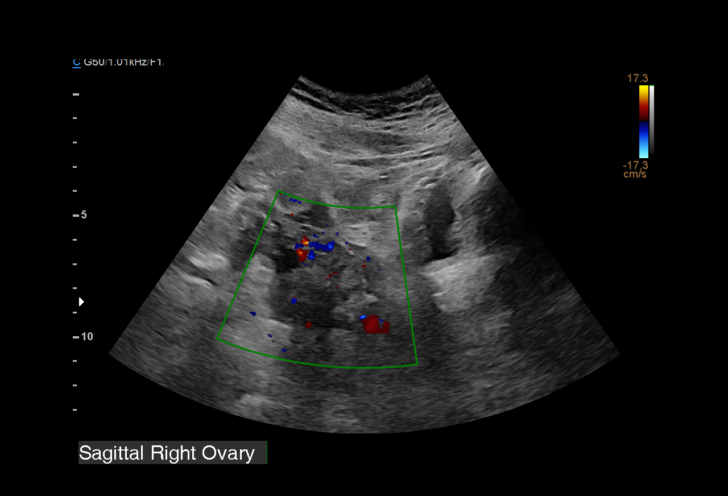
[im 18/59]
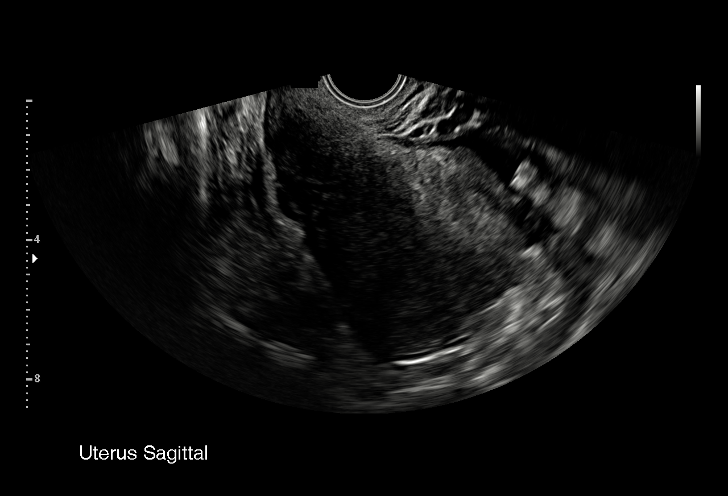
[im 23/59]
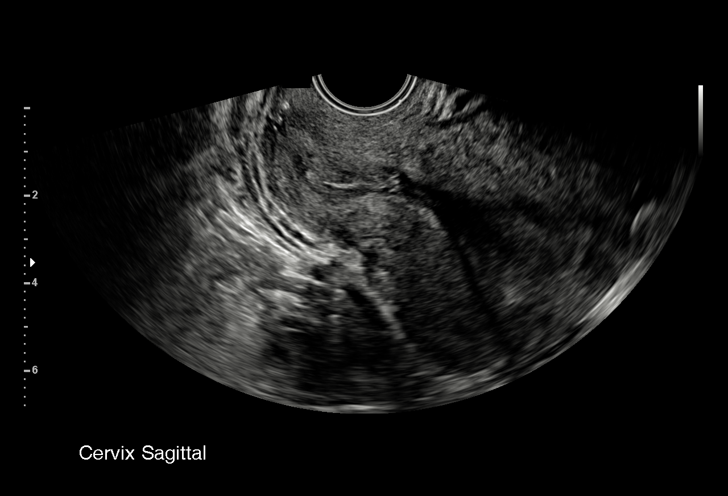
[im 27/59]
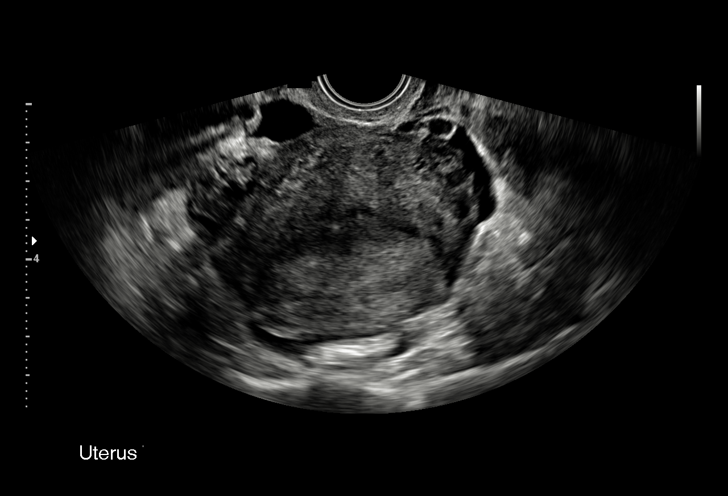
[im 32/59]
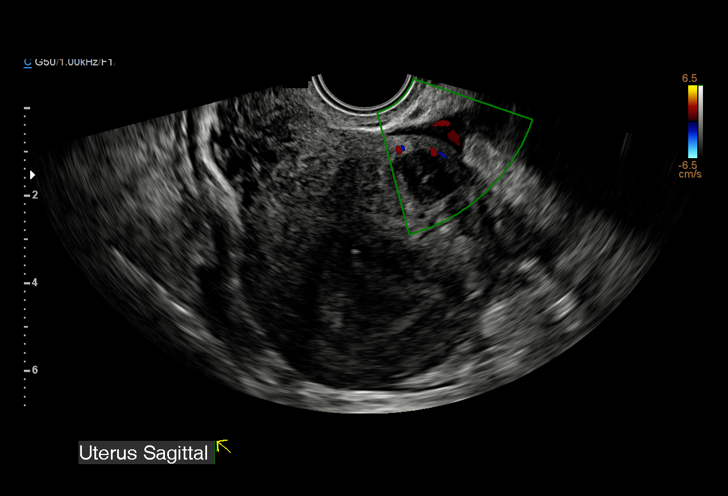
[im 34/59]
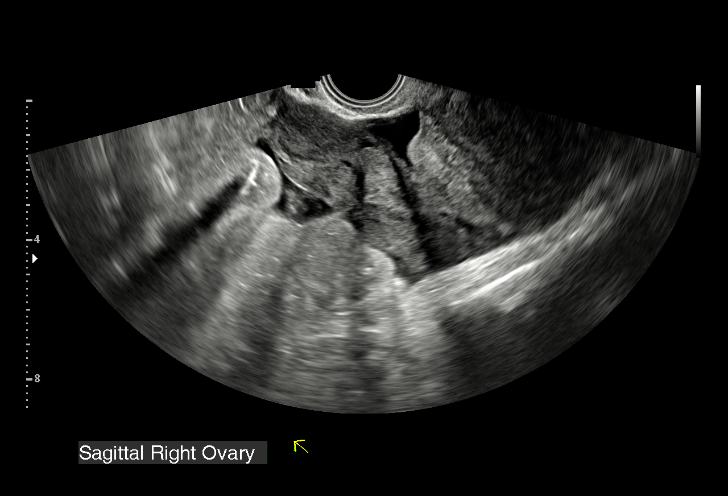
[im 38/59]
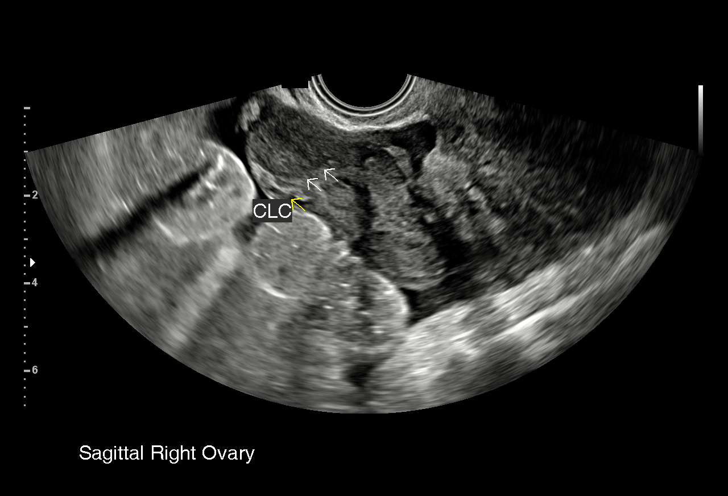
[im 43/59]
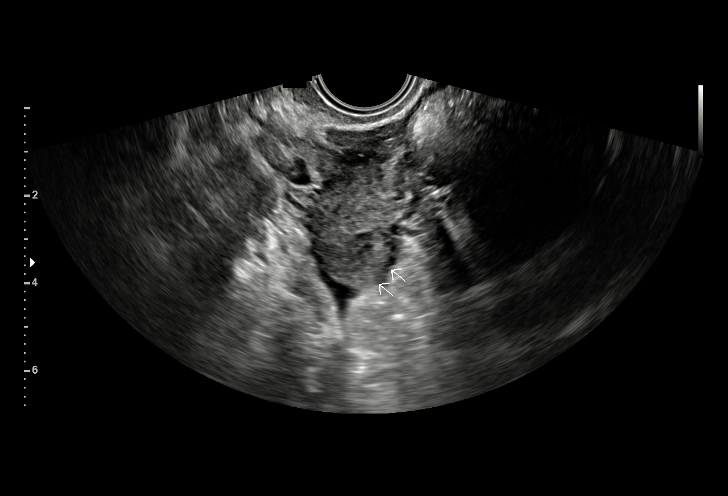
[im 47/59]
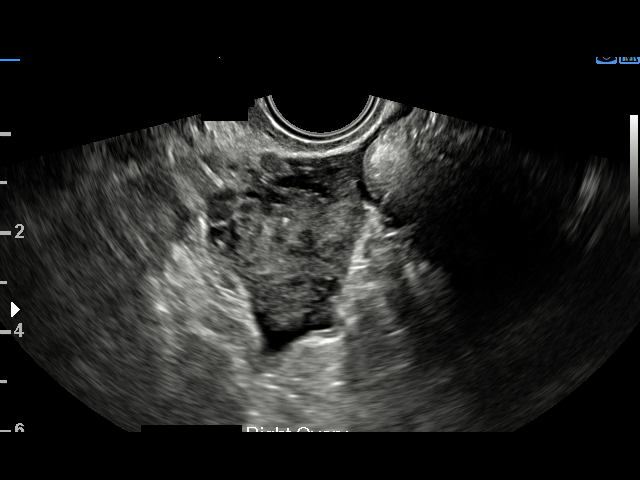
[im 52/59]
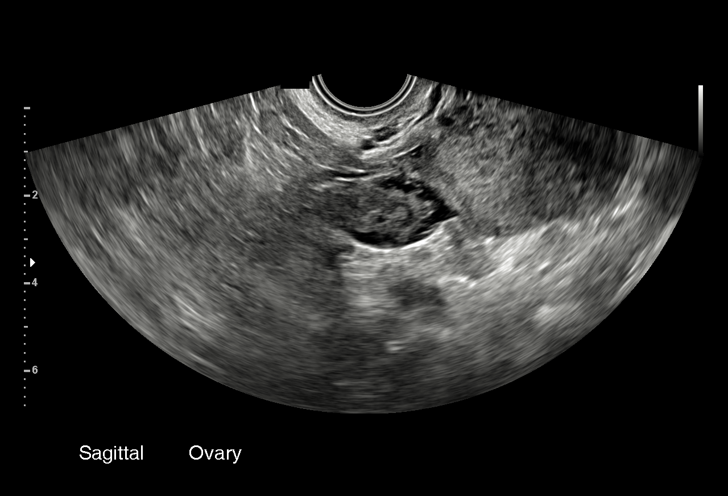
[im 56/59]
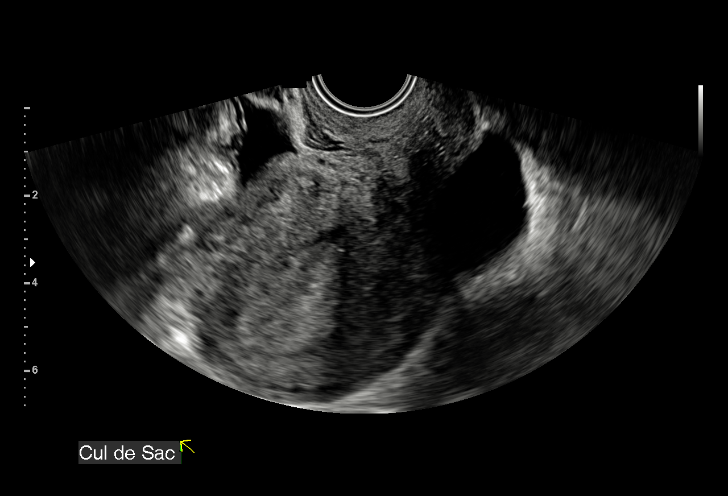

[Series 3: us ob < 14 weeks - us ob tv · 1 of 1 slices shown (2 of 2)]
[im 1/1]
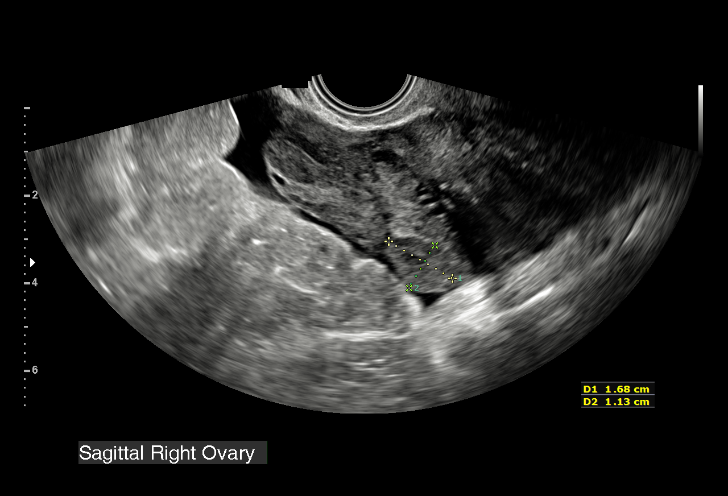

[15 of 28 positions shown; findings below may reference images not displayed]

FINDINGS: Intrauterine gestational sac: None

Yolk sac:  Not Visualized.

Embryo:  Not Visualized.

Cardiac Activity: Not Visualized.

Maternal uterus/adnexae: The uterus is retroverted. No fluid or
gestational sac in the endometrial canal. Mild endometrial
thickening. Hypoechoic anterior uterine fibroid measuring 1.6 cm,
likely subserosal, but difficult to assess given positioning.

There is a corpus luteal cyst in the right ovary with peripheral
blood flow measuring approximately 18 mm. There is the rounded
hypoechoic area within the lower right ovary measuring 1.7 x 1.1 cm
that has some blood flow. This does not separate from the ovary with
probe pressure, that is likely ovarian tissue but difficult to
exclude ectopic. Small amount of adjacent free fluid. Left ovary is
normal measuring 2.0 x 3.1 x 1.8 cm. Small amount of free fluid in
the right adnexa and pelvis.
IMPRESSION: 1. No intrauterine gestation. Heterogeneous endometrium but no fluid
or gestational sac in the endometrial canal.
2. Physiologic corpus luteal cyst in the right ovary. Additional
rounded hypoechoic area in right ovary with some blood flow, felt to
represent ovarian tissue. Ectopic pregnancy is felt less likely but
difficult to exclude given small amount of adjacent free fluid.
Recommend trending of beta HCG and follow-up ultrasound recommended
in 7 days.

These results were called by telephone at the time of interpretation
acknowledged these results.

## 2020-04-28 DIAGNOSIS — O321XX9 Maternal care for breech presentation, other fetus: Secondary | ICD-10-CM | POA: Diagnosis not present

## 2020-04-28 DIAGNOSIS — O10013 Pre-existing essential hypertension complicating pregnancy, third trimester: Secondary | ICD-10-CM | POA: Diagnosis not present

## 2020-04-28 DIAGNOSIS — O139 Gestational [pregnancy-induced] hypertension without significant proteinuria, unspecified trimester: Secondary | ICD-10-CM | POA: Diagnosis not present

## 2020-04-28 DIAGNOSIS — O99213 Obesity complicating pregnancy, third trimester: Secondary | ICD-10-CM | POA: Diagnosis not present

## 2020-04-28 DIAGNOSIS — Z3A36 36 weeks gestation of pregnancy: Secondary | ICD-10-CM | POA: Diagnosis not present

## 2020-04-28 DIAGNOSIS — O321XX1 Maternal care for breech presentation, fetus 1: Secondary | ICD-10-CM | POA: Diagnosis not present

## 2020-04-28 DIAGNOSIS — Z3685 Encounter for antenatal screening for Streptococcus B: Secondary | ICD-10-CM | POA: Diagnosis not present

## 2020-04-29 ENCOUNTER — Encounter (HOSPITAL_COMMUNITY): Payer: Self-pay | Admitting: *Deleted

## 2020-04-29 ENCOUNTER — Telehealth (HOSPITAL_COMMUNITY): Payer: Self-pay | Admitting: *Deleted

## 2020-04-29 NOTE — Telephone Encounter (Signed)
Preadmission screen  

## 2020-05-02 ENCOUNTER — Other Ambulatory Visit (HOSPITAL_COMMUNITY)
Admission: RE | Admit: 2020-05-02 | Discharge: 2020-05-02 | Disposition: A | Discharge status: Home / Self Care | Payer: Medicare HMO | Source: Ambulatory Visit | Attending: Obstetrics and Gynecology | Admitting: Obstetrics and Gynecology

## 2020-05-02 DIAGNOSIS — Z20822 Contact with and (suspected) exposure to covid-19: Secondary | ICD-10-CM | POA: Insufficient documentation

## 2020-05-02 DIAGNOSIS — Z01812 Encounter for preprocedural laboratory examination: Secondary | ICD-10-CM | POA: Diagnosis not present

## 2020-05-02 LAB — SARS CORONAVIRUS 2 (TAT 6-24 HRS): SARS Coronavirus 2: NEGATIVE

## 2020-05-03 ENCOUNTER — Inpatient Hospital Stay (HOSPITAL_COMMUNITY)
Admission: RE | Admit: 2020-05-03 | Payer: Medicare HMO | Source: Ambulatory Visit | Admitting: Obstetrics and Gynecology

## 2020-05-03 ENCOUNTER — Ambulatory Visit (HOSPITAL_COMMUNITY)
Admission: AD | Admit: 2020-05-03 | Discharge: 2020-05-03 | Disposition: A | Discharge status: Home / Self Care | Payer: Medicare HMO | Attending: Obstetrics and Gynecology | Admitting: Obstetrics and Gynecology

## 2020-05-04 ENCOUNTER — Encounter (HOSPITAL_COMMUNITY): Payer: Medicare HMO

## 2020-05-05 IMAGING — US US OB TRANSVAGINAL
1 series · 15 of 28 positions shown · non-contrast
Comparison: September 14, 2019

CLINICAL DATA: Positive beta HCG. No gestational sac seen on recent
study

EXAM:
TRANSVAGINAL OB ULTRASOUND
TECHNIQUE: Transvaginal ultrasound was performed for complete evaluation of the
gestation as well as the maternal uterus, adnexal regions, and
pelvic cul-de-sac.

[Series 1: us ob transvaginal · 15 of 33 slices shown]
[im 1/33]
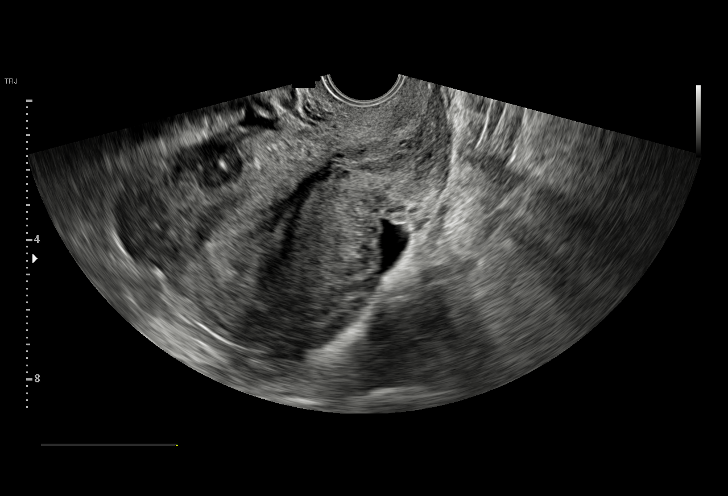
[im 3/33]
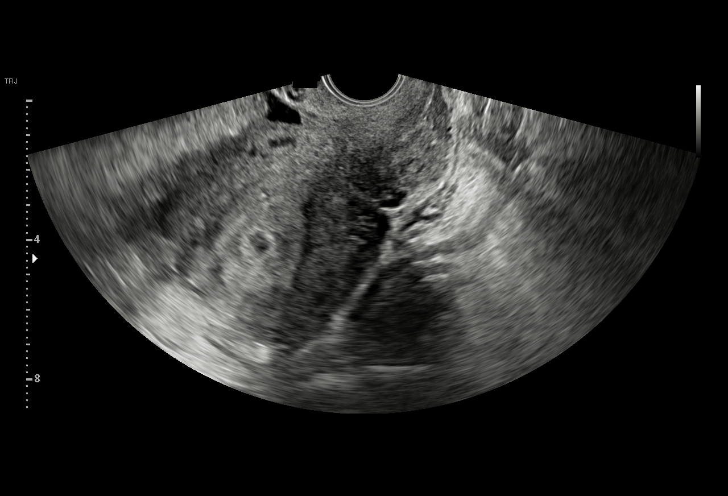
[im 5/33]
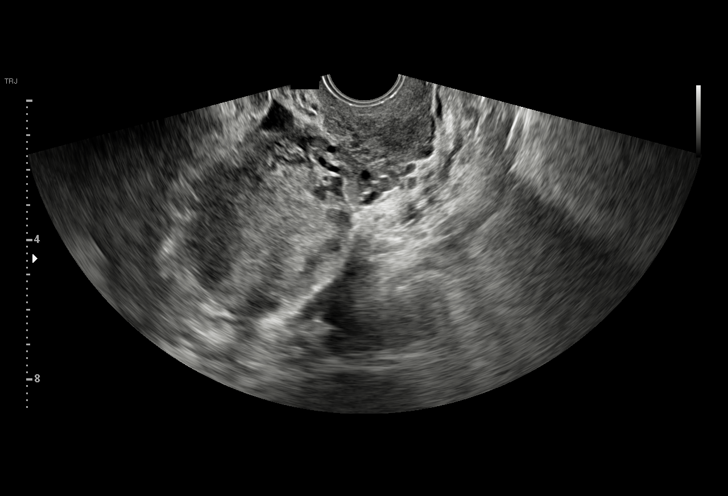
[im 8/33]
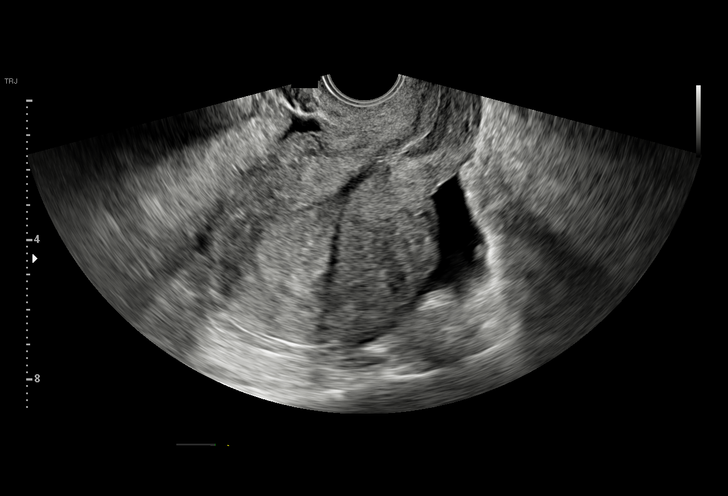
[im 10/33]
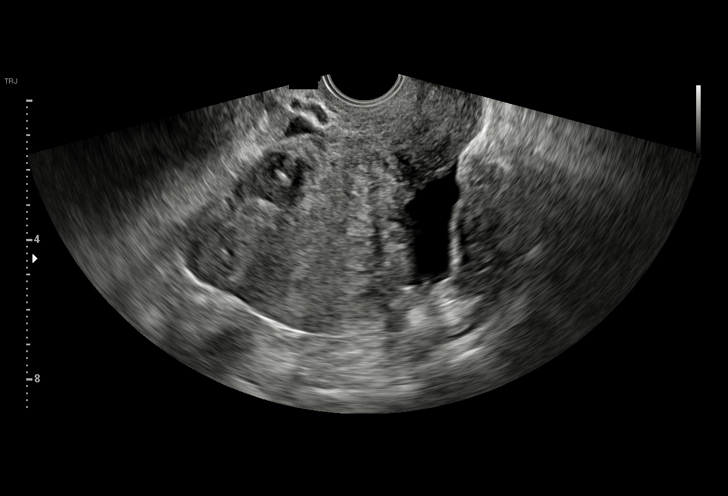
[im 12/33]
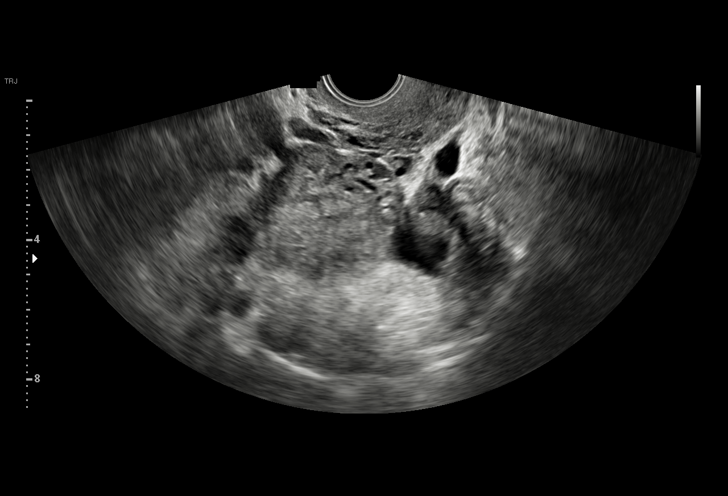
[im 15/33]
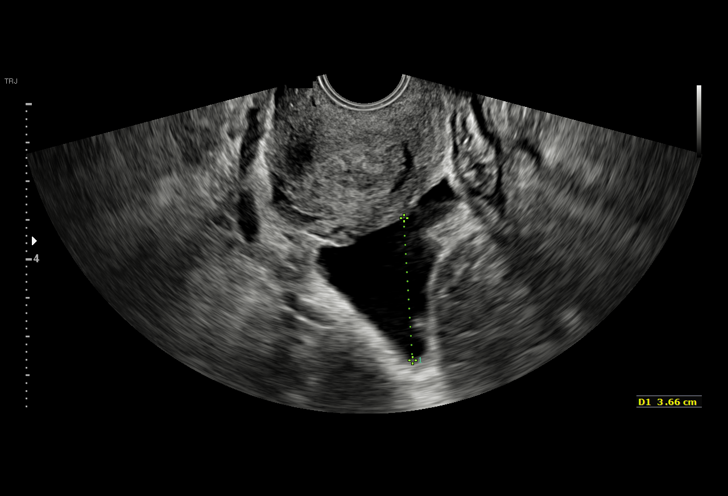
[im 17/33]
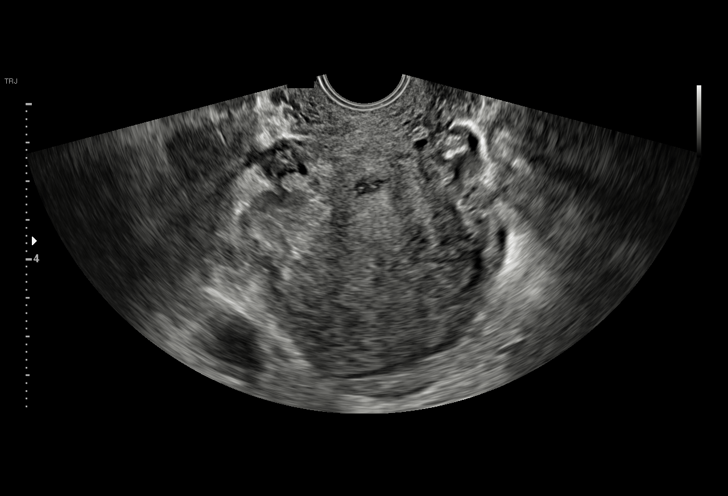
[im 18/33]
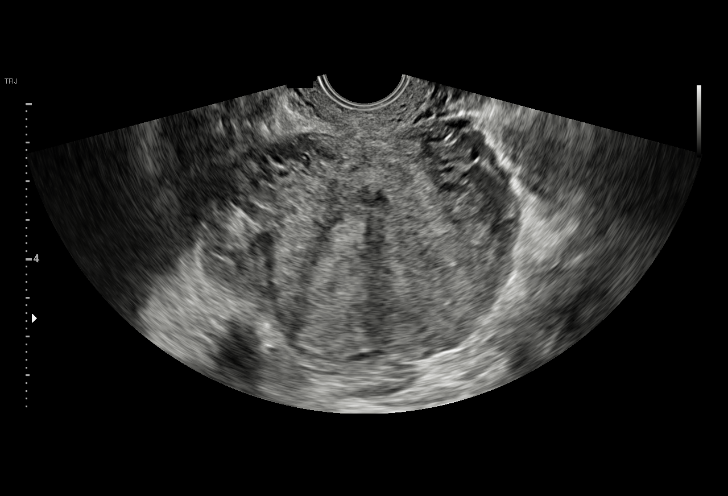
[im 21/33]
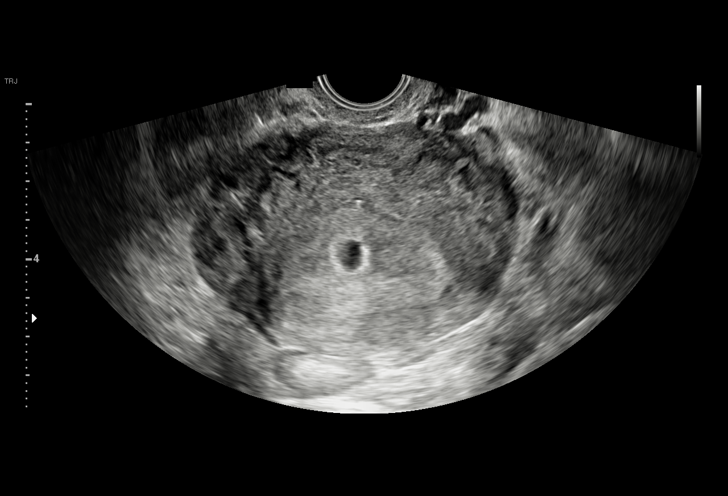
[im 23/33]
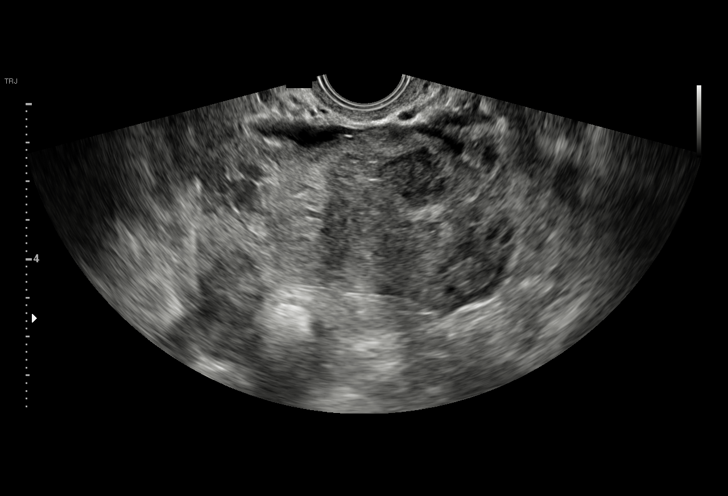
[im 25/33]
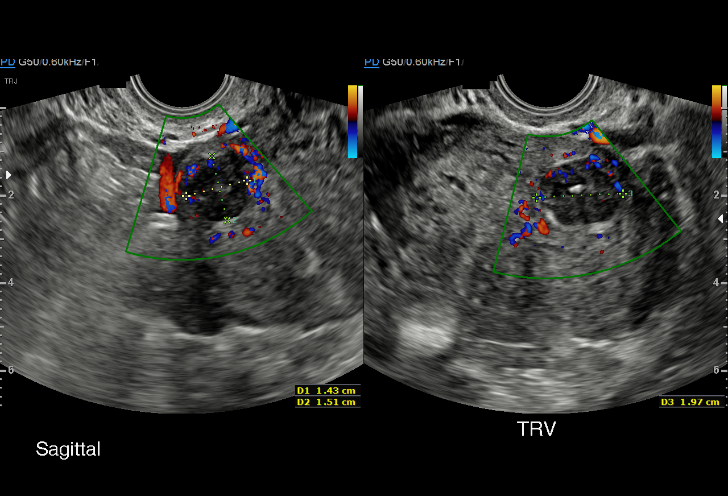
[im 28/33]
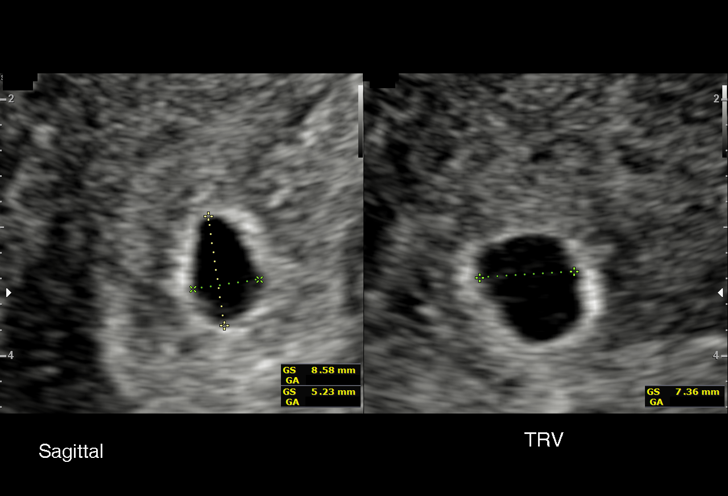
[im 30/33]
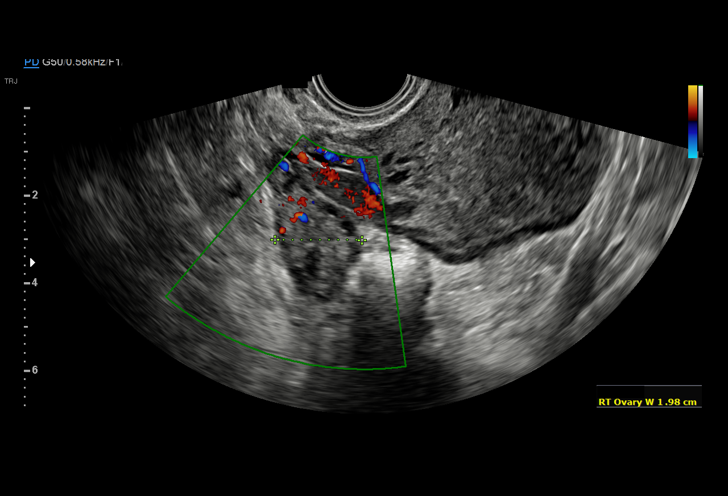
[im 33/33]
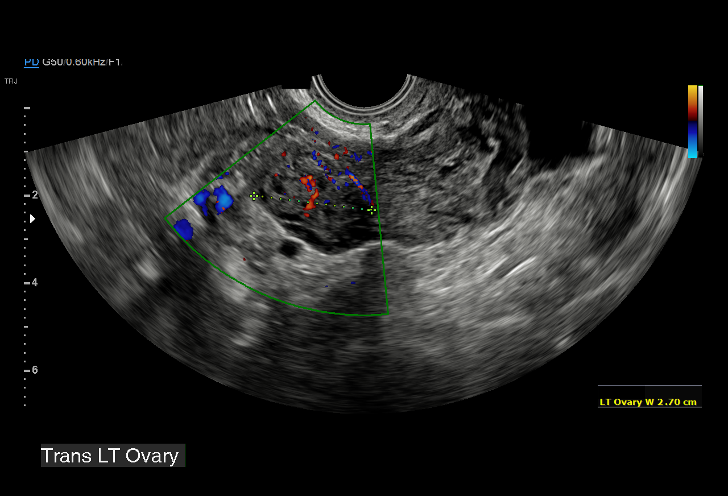

[15 of 28 positions shown; findings below may reference images not displayed]

FINDINGS: Intrauterine gestational sac: Visualized

Yolk sac:  Visualized

Embryo:  Not visualized

Cardiac Activity: Not visualized

MSD: 7 mm   5 w   3 d

Subchorionic hemorrhage:  None visualized.

Maternal uterus/adnexae: There is a probable leiomyoma within the
leftward aspect of the uterus measuring 1.4 x 1.5 x 2.0 cm. Cervical
os is closed.

Right ovary measures 4.6 x 2.2 x 02.0 cm. Left ovary measures 3.9 x
2.3 x 2.7 cm. Small corpus luteum on the right. There is moderate
fluid in the cul-de-sac.
IMPRESSION: 1. Within the uterus, there is now a gestational sac containing a
yolk sac. Fetal pole currently not seen. This circumstance warrants
a follow-up study in 10-14 days to assess for fetal pole and fetal
heart activity.

2. Probable leiomyoma along the leftward aspect of the uterus
measuring 1.4 x 1.5 x 2.0 cm. No subchorionic hemorrhage evident.

3. Fluid in the cul-de-sac may be indicative of recent cyst/corpus
luteum leakage or rupture.

## 2020-05-06 ENCOUNTER — Telehealth (HOSPITAL_COMMUNITY): Payer: Self-pay | Admitting: *Deleted

## 2020-05-06 NOTE — Telephone Encounter (Signed)
Preadmission screen  

## 2020-05-09 ENCOUNTER — Encounter (HOSPITAL_COMMUNITY): Payer: Self-pay | Admitting: Obstetrics and Gynecology

## 2020-05-09 ENCOUNTER — Telehealth (HOSPITAL_COMMUNITY): Payer: Self-pay | Admitting: *Deleted

## 2020-05-09 ENCOUNTER — Inpatient Hospital Stay (EMERGENCY_DEPARTMENT_HOSPITAL)
Admission: AD | Admit: 2020-05-09 | Discharge: 2020-05-09 | Disposition: A | Discharge status: Home / Self Care | Payer: Medicare HMO | Source: Home / Self Care | Attending: Obstetrics and Gynecology | Admitting: Obstetrics and Gynecology

## 2020-05-09 ENCOUNTER — Other Ambulatory Visit: Payer: Self-pay

## 2020-05-09 ENCOUNTER — Inpatient Hospital Stay (HOSPITAL_COMMUNITY)
Admission: AD | Admit: 2020-05-09 | Discharge: 2020-05-11 | DRG: 807 | Disposition: A | Discharge status: Home / Self Care | Payer: Medicare HMO | Attending: Obstetrics and Gynecology | Admitting: Obstetrics and Gynecology

## 2020-05-09 DIAGNOSIS — O321XX0 Maternal care for breech presentation, not applicable or unspecified: Secondary | ICD-10-CM | POA: Diagnosis present

## 2020-05-09 DIAGNOSIS — O1002 Pre-existing essential hypertension complicating childbirth: Principal | ICD-10-CM | POA: Diagnosis present

## 2020-05-09 DIAGNOSIS — Z3A38 38 weeks gestation of pregnancy: Secondary | ICD-10-CM

## 2020-05-09 DIAGNOSIS — O9952 Diseases of the respiratory system complicating childbirth: Secondary | ICD-10-CM | POA: Diagnosis not present

## 2020-05-09 DIAGNOSIS — O99214 Obesity complicating childbirth: Secondary | ICD-10-CM | POA: Diagnosis present

## 2020-05-09 DIAGNOSIS — O139 Gestational [pregnancy-induced] hypertension without significant proteinuria, unspecified trimester: Secondary | ICD-10-CM | POA: Diagnosis not present

## 2020-05-09 DIAGNOSIS — Z20822 Contact with and (suspected) exposure to covid-19: Secondary | ICD-10-CM | POA: Diagnosis present

## 2020-05-09 DIAGNOSIS — Z3A37 37 weeks gestation of pregnancy: Secondary | ICD-10-CM

## 2020-05-09 DIAGNOSIS — O471 False labor at or after 37 completed weeks of gestation: Secondary | ICD-10-CM | POA: Insufficient documentation

## 2020-05-09 DIAGNOSIS — O26893 Other specified pregnancy related conditions, third trimester: Secondary | ICD-10-CM | POA: Diagnosis present

## 2020-05-09 DIAGNOSIS — Z87891 Personal history of nicotine dependence: Secondary | ICD-10-CM | POA: Diagnosis not present

## 2020-05-09 DIAGNOSIS — O479 False labor, unspecified: Secondary | ICD-10-CM | POA: Diagnosis present

## 2020-05-09 DIAGNOSIS — J45909 Unspecified asthma, uncomplicated: Secondary | ICD-10-CM | POA: Diagnosis not present

## 2020-05-09 DIAGNOSIS — Z8614 Personal history of Methicillin resistant Staphylococcus aureus infection: Secondary | ICD-10-CM

## 2020-05-09 DIAGNOSIS — O10919 Unspecified pre-existing hypertension complicating pregnancy, unspecified trimester: Secondary | ICD-10-CM | POA: Diagnosis present

## 2020-05-09 LAB — URINALYSIS, ROUTINE W REFLEX MICROSCOPIC
Bilirubin Urine: NEGATIVE
Glucose, UA: NEGATIVE mg/dL
Ketones, ur: NEGATIVE mg/dL
Leukocytes,Ua: NEGATIVE
Nitrite: NEGATIVE
Protein, ur: NEGATIVE mg/dL
Specific Gravity, Urine: 1.021 (ref 1.005–1.030)
pH: 6 (ref 5.0–8.0)

## 2020-05-09 MED ORDER — SODIUM CHLORIDE 0.9% FLUSH: 3.0000 mL | INTRAVENOUS | Status: DC | PRN

## 2020-05-09 MED ORDER — ACETAMINOPHEN 500 MG PO TABS
1000.0000 mg | ORAL_TABLET | Freq: Once | ORAL | Status: AC
  Administered 2020-05-09: 1000 mg via ORAL
  Filled 2020-05-09: qty 2

## 2020-05-09 MED ORDER — SODIUM CHLORIDE 0.9% FLUSH
3.0000 mL | Freq: Two times a day (BID) | INTRAVENOUS | Status: DC
  Administered 2020-05-10 – 2020-05-11 (×2): 3 mL via INTRAVENOUS

## 2020-05-09 MED ORDER — SODIUM CHLORIDE 0.9 % IV SOLN: 250.0000 mL | INTRAVENOUS | Status: DC | PRN

## 2020-05-09 NOTE — MAU Provider Note (Signed)
Ms. DENEE BOEDER is a 34 y.o. [id number] at [redacted]w[redacted]d who presents to MAU today for labor evaluation. The patient was breech recently and missed appointment for ECV. RN labor check unsure of presenting part. US performed at bedside.   Pt informed that the ultrasound is considered a limited OB ultrasound and is not intended to be a complete ultrasound exam.  Patient also informed that the ultrasound is not being completed with the intent of assessing for fetal or placental anomalies or any pelvic abnormalities.  Explained that the purpose of today's ultrasound is to assess for  presentation.  Patient acknowledges the purpose of the exam and the limitations of the study.  Cephalic presentation confirmed.   RN will recheck patient in 1 hour to determine if admission is necessary today  Kerry Hough, PA-C 05/09/2020 11:11 AM

## 2020-05-09 NOTE — MAU Note (Signed)
.   [name] is a 34 y.o. at [redacted]w[redacted]d here in MAU reporting: she has been having contractions since yesterday. States she was scheduled for a version last week due to breech presentation. States that her appointment time was wrong and she does not want to try to turn baby just prefers a C/S  Onset of complaint: yesterday morning Pain score: 10 Vitals:   05/09/20 1034 05/09/20 1035  BP: (!) 144/78   Pulse: 71   Resp: 16   Temp: 98.5 F (36.9 C)   SpO2:  99%     FHT:150 Lab orders placed from triage: UA

## 2020-05-09 NOTE — MAU Note (Signed)
Pt here with reports of contractions. Has not been timing them. Denies LOF. Has some bloody show. Good fetal movement. Cervix 4cm yesterday.

## 2020-05-09 NOTE — Telephone Encounter (Signed)
Pt is in MAU for R/O labor.  Will call again tomorrow if she is discharged.

## 2020-05-10 ENCOUNTER — Inpatient Hospital Stay (HOSPITAL_COMMUNITY): Payer: Medicare HMO | Admitting: Anesthesiology

## 2020-05-10 ENCOUNTER — Encounter (HOSPITAL_COMMUNITY): Payer: Self-pay | Admitting: Obstetrics and Gynecology

## 2020-05-10 DIAGNOSIS — O1002 Pre-existing essential hypertension complicating childbirth: Secondary | ICD-10-CM | POA: Diagnosis present

## 2020-05-10 DIAGNOSIS — O321XX0 Maternal care for breech presentation, not applicable or unspecified: Secondary | ICD-10-CM | POA: Diagnosis present

## 2020-05-10 DIAGNOSIS — Z20822 Contact with and (suspected) exposure to covid-19: Secondary | ICD-10-CM | POA: Diagnosis present

## 2020-05-10 DIAGNOSIS — O10919 Unspecified pre-existing hypertension complicating pregnancy, unspecified trimester: Secondary | ICD-10-CM | POA: Diagnosis present

## 2020-05-10 DIAGNOSIS — Z87891 Personal history of nicotine dependence: Secondary | ICD-10-CM | POA: Diagnosis not present

## 2020-05-10 DIAGNOSIS — Z3A38 38 weeks gestation of pregnancy: Secondary | ICD-10-CM | POA: Diagnosis not present

## 2020-05-10 DIAGNOSIS — O99214 Obesity complicating childbirth: Secondary | ICD-10-CM | POA: Diagnosis present

## 2020-05-10 DIAGNOSIS — Z8614 Personal history of Methicillin resistant Staphylococcus aureus infection: Secondary | ICD-10-CM | POA: Diagnosis not present

## 2020-05-10 DIAGNOSIS — O26893 Other specified pregnancy related conditions, third trimester: Secondary | ICD-10-CM | POA: Diagnosis present

## 2020-05-10 LAB — COMPREHENSIVE METABOLIC PANEL
ALT: 17 U/L (ref 0–44)
AST: 18 U/L (ref 15–41)
Albumin: 2.9 g/dL — ABNORMAL LOW (ref 3.5–5.0)
Alkaline Phosphatase: 106 U/L (ref 38–126)
Anion gap: 11 (ref 5–15)
BUN: 7 mg/dL (ref 6–20)
CO2: 20 mmol/L — ABNORMAL LOW (ref 22–32)
Calcium: 9.3 mg/dL (ref 8.9–10.3)
Chloride: 105 mmol/L (ref 98–111)
Creatinine, Ser: 0.63 mg/dL (ref 0.44–1.00)
GFR calc Af Amer: >60 mL/min (ref >60)
GFR calc non Af Amer: >60 mL/min (ref >60)
Glucose, Bld: 92 mg/dL (ref 70–99)
Potassium: 3.6 mmol/L (ref 3.5–5.1)
Sodium: 136 mmol/L (ref 135–145)
Total Bilirubin: 0.7 mg/dL (ref 0.3–1.2)
Total Protein: 6.7 g/dL (ref 6.5–8.1)

## 2020-05-10 LAB — RPR: RPR Ser Ql: NONREACTIVE

## 2020-05-10 LAB — SARS CORONAVIRUS 2 BY RT PCR (HOSPITAL ORDER, PERFORMED IN ~~LOC~~ HOSPITAL LAB): SARS Coronavirus 2: NEGATIVE

## 2020-05-10 LAB — CBC
HCT: 36.6 % (ref 36.0–46.0)
Hemoglobin: 11.8 g/dL — ABNORMAL LOW (ref 12.0–15.0)
MCH: 27.9 pg (ref 26.0–34.0)
MCHC: 32.2 g/dL (ref 30.0–36.0)
MCV: 86.5 fL (ref 80.0–100.0)
Platelets: 273 10*3/uL (ref 150–400)
RBC: 4.23 MIL/uL (ref 3.87–5.11)
RDW: 15.5 % (ref 11.5–15.5)
WBC: 10.9 10*3/uL — ABNORMAL HIGH (ref 4.0–10.5)
nRBC: 0 % (ref 0.0–0.2)

## 2020-05-10 LAB — TYPE AND SCREEN
ABO/RH(D): A POS
Antibody Screen: NEGATIVE

## 2020-05-10 MED ORDER — FLUOXETINE HCL 20 MG PO CAPS
20.0000 mg | ORAL_CAPSULE | Freq: Every day | ORAL | Status: DC
  Administered 2020-05-10 – 2020-05-11 (×2): 20 mg via ORAL
  Filled 2020-05-10 (×2): qty 1

## 2020-05-10 MED ORDER — PHENYLEPHRINE 40 MCG/ML (10ML) SYRINGE FOR IV PUSH (FOR BLOOD PRESSURE SUPPORT): 80.0000 ug | PREFILLED_SYRINGE | INTRAVENOUS | Status: DC | PRN

## 2020-05-10 MED ORDER — BENZOCAINE-MENTHOL 20-0.5 % EX AERO
1.0000 "application " | INHALATION_SPRAY | CUTANEOUS | Status: DC | PRN
  Administered 2020-05-10: 1 via TOPICAL
  Filled 2020-05-10: qty 56

## 2020-05-10 MED ORDER — LIDOCAINE HCL (PF) 1 % IJ SOLN
INTRAMUSCULAR | Status: DC | PRN
  Administered 2020-05-10: 5 mL via EPIDURAL

## 2020-05-10 MED ORDER — IBUPROFEN 600 MG PO TABS
600.0000 mg | ORAL_TABLET | Freq: Four times a day (QID) | ORAL | Status: DC
  Administered 2020-05-10 – 2020-05-11 (×2): 600 mg via ORAL
  Filled 2020-05-10 (×2): qty 1

## 2020-05-10 MED ORDER — LABETALOL HCL 200 MG PO TABS
200.0000 mg | ORAL_TABLET | Freq: Once | ORAL | Status: AC
  Administered 2020-05-10: 200 mg via ORAL
  Filled 2020-05-10: qty 1

## 2020-05-10 MED ORDER — OXYTOCIN-SODIUM CHLORIDE 30-0.9 UT/500ML-% IV SOLN
1.0000 m[IU]/min | INTRAVENOUS | Status: DC
  Administered 2020-05-10: 2 m[IU]/min via INTRAVENOUS
  Filled 2020-05-10: qty 500

## 2020-05-10 MED ORDER — LACTATED RINGERS IV SOLN: 500.0000 mL | INTRAVENOUS | Status: DC | PRN

## 2020-05-10 MED ORDER — METHYLERGONOVINE MALEATE 0.2 MG/ML IJ SOLN: 0.2000 mg | INTRAMUSCULAR | Status: DC | PRN

## 2020-05-10 MED ORDER — OXYCODONE-ACETAMINOPHEN 5-325 MG PO TABS: 2.0000 | ORAL_TABLET | ORAL | Status: DC | PRN

## 2020-05-10 MED ORDER — TERBUTALINE SULFATE 1 MG/ML IJ SOLN: 0.2500 mg | Freq: Once | INTRAMUSCULAR | Status: DC | PRN

## 2020-05-10 MED ORDER — COCONUT OIL OIL: 1.0000 "application " | TOPICAL_OIL | Status: DC | PRN

## 2020-05-10 MED ORDER — ALBUTEROL SULFATE (2.5 MG/3ML) 0.083% IN NEBU: 2.5000 mg | INHALATION_SOLUTION | RESPIRATORY_TRACT | Status: DC | PRN

## 2020-05-10 MED ORDER — SOD CITRATE-CITRIC ACID 500-334 MG/5ML PO SOLN: 30.0000 mL | ORAL | Status: DC | PRN

## 2020-05-10 MED ORDER — ACETAMINOPHEN 325 MG PO TABS: 650.0000 mg | ORAL_TABLET | ORAL | Status: DC | PRN

## 2020-05-10 MED ORDER — SENNOSIDES-DOCUSATE SODIUM 8.6-50 MG PO TABS
2.0000 | ORAL_TABLET | ORAL | Status: DC
  Administered 2020-05-10: 2 via ORAL
  Filled 2020-05-10: qty 2

## 2020-05-10 MED ORDER — EPHEDRINE 5 MG/ML INJ: 10.0000 mg | INTRAVENOUS | Status: DC | PRN

## 2020-05-10 MED ORDER — PRENATAL MULTIVITAMIN CH
1.0000 | ORAL_TABLET | Freq: Every day | ORAL | Status: DC
  Administered 2020-05-10: 1 via ORAL
  Filled 2020-05-10: qty 1

## 2020-05-10 MED ORDER — WITCH HAZEL-GLYCERIN EX PADS: 1.0000 "application " | MEDICATED_PAD | CUTANEOUS | Status: DC | PRN

## 2020-05-10 MED ORDER — OXYTOCIN-SODIUM CHLORIDE 30-0.9 UT/500ML-% IV SOLN: 2.5000 [IU]/h | INTRAVENOUS | Status: DC

## 2020-05-10 MED ORDER — OXYCODONE HCL 5 MG PO TABS: 5.0000 mg | ORAL_TABLET | ORAL | Status: DC | PRN

## 2020-05-10 MED ORDER — DEXTROSE 5 % IV SOLN
3.0000 g | INTRAVENOUS | Status: AC
  Filled 2020-05-10: qty 3000

## 2020-05-10 MED ORDER — LACTATED RINGERS IV SOLN: INTRAVENOUS | Status: DC

## 2020-05-10 MED ORDER — METHYLERGONOVINE MALEATE 0.2 MG PO TABS: 0.2000 mg | ORAL_TABLET | ORAL | Status: DC | PRN

## 2020-05-10 MED ORDER — POVIDONE-IODINE 10 % EX SWAB: 2.0000 "application " | Freq: Once | CUTANEOUS | Status: DC

## 2020-05-10 MED ORDER — OXYCODONE-ACETAMINOPHEN 5-325 MG PO TABS: 1.0000 | ORAL_TABLET | ORAL | Status: DC | PRN

## 2020-05-10 MED ORDER — ONDANSETRON HCL 4 MG/2ML IJ SOLN: 4.0000 mg | INTRAMUSCULAR | Status: DC | PRN

## 2020-05-10 MED ORDER — SODIUM CHLORIDE (PF) 0.9 % IJ SOLN
INTRAMUSCULAR | Status: DC | PRN
  Administered 2020-05-10: 12 mL/h via EPIDURAL

## 2020-05-10 MED ORDER — LACTATED RINGERS IV SOLN
500.0000 mL | Freq: Once | INTRAVENOUS | Status: AC
  Administered 2020-05-10: 500 mL via INTRAVENOUS

## 2020-05-10 MED ORDER — FENTANYL-BUPIVACAINE-NACL 0.5-0.125-0.9 MG/250ML-% EP SOLN
12.0000 mL/h | EPIDURAL | Status: DC | PRN
  Filled 2020-05-10: qty 250

## 2020-05-10 MED ORDER — SIMETHICONE 80 MG PO CHEW: 80.0000 mg | CHEWABLE_TABLET | ORAL | Status: DC | PRN

## 2020-05-10 MED ORDER — TETANUS-DIPHTH-ACELL PERTUSSIS 5-2.5-18.5 LF-MCG/0.5 IM SUSP: 0.5000 mL | Freq: Once | INTRAMUSCULAR | Status: DC

## 2020-05-10 MED ORDER — ALBUTEROL SULFATE HFA 108 (90 BASE) MCG/ACT IN AERS: 2.0000 | INHALATION_SPRAY | RESPIRATORY_TRACT | Status: DC | PRN

## 2020-05-10 MED ORDER — DIPHENHYDRAMINE HCL 25 MG PO CAPS: 25.0000 mg | ORAL_CAPSULE | Freq: Four times a day (QID) | ORAL | Status: DC | PRN

## 2020-05-10 MED ORDER — MAGNESIUM HYDROXIDE 400 MG/5ML PO SUSP: 30.0000 mL | ORAL | Status: DC | PRN

## 2020-05-10 MED ORDER — BUTORPHANOL TARTRATE 1 MG/ML IJ SOLN: 1.0000 mg | INTRAMUSCULAR | Status: DC | PRN

## 2020-05-10 MED ORDER — LABETALOL HCL 200 MG PO TABS
400.0000 mg | ORAL_TABLET | Freq: Two times a day (BID) | ORAL | Status: DC
  Administered 2020-05-10 – 2020-05-11 (×3): 400 mg via ORAL
  Filled 2020-05-10 (×3): qty 2

## 2020-05-10 MED ORDER — LIDOCAINE HCL (PF) 1 % IJ SOLN: 30.0000 mL | INTRAMUSCULAR | Status: DC | PRN

## 2020-05-10 MED ORDER — FUROSEMIDE 40 MG PO TABS
40.0000 mg | ORAL_TABLET | Freq: Every day | ORAL | Status: DC | PRN
  Administered 2020-05-10: 40 mg via ORAL
  Filled 2020-05-10 (×2): qty 1

## 2020-05-10 MED ORDER — ONDANSETRON HCL 4 MG/2ML IJ SOLN: 4.0000 mg | Freq: Four times a day (QID) | INTRAMUSCULAR | Status: DC | PRN

## 2020-05-10 MED ORDER — AMLODIPINE BESYLATE 5 MG PO TABS
10.0000 mg | ORAL_TABLET | Freq: Every day | ORAL | Status: DC
  Administered 2020-05-10 – 2020-05-11 (×2): 10 mg via ORAL
  Filled 2020-05-10 (×2): qty 2

## 2020-05-10 MED ORDER — OXYTOCIN BOLUS FROM INFUSION
333.0000 mL | Freq: Once | INTRAVENOUS | Status: AC
  Administered 2020-05-10: 333 mL via INTRAVENOUS

## 2020-05-10 MED ORDER — OXYCODONE HCL 5 MG PO TABS: 10.0000 mg | ORAL_TABLET | ORAL | Status: DC | PRN

## 2020-05-10 MED ORDER — DIPHENHYDRAMINE HCL 50 MG/ML IJ SOLN: 12.5000 mg | INTRAMUSCULAR | Status: DC | PRN

## 2020-05-10 MED ORDER — ACETAMINOPHEN 325 MG PO TABS
650.0000 mg | ORAL_TABLET | ORAL | Status: DC | PRN
  Administered 2020-05-10 – 2020-05-11 (×3): 650 mg via ORAL
  Filled 2020-05-10 (×4): qty 2

## 2020-05-10 MED ORDER — ONDANSETRON HCL 4 MG PO TABS: 4.0000 mg | ORAL_TABLET | ORAL | Status: DC | PRN

## 2020-05-10 MED ORDER — ZOLPIDEM TARTRATE 5 MG PO TABS: 5.0000 mg | ORAL_TABLET | Freq: Every evening | ORAL | Status: DC | PRN

## 2020-05-10 MED ORDER — MEASLES, MUMPS & RUBELLA VAC IJ SOLR: 0.5000 mL | Freq: Once | INTRAMUSCULAR | Status: DC

## 2020-05-10 MED ORDER — DIBUCAINE (PERIANAL) 1 % EX OINT: 1.0000 "application " | TOPICAL_OINTMENT | CUTANEOUS | Status: DC | PRN

## 2020-05-10 NOTE — Anesthesia Postprocedure Evaluation (Signed)
Anesthesia Post Note  Patient: Neila Dianna Rossetti  Procedure(s) Performed: AN AD HOC LABOR EPIDURAL     Patient location during evaluation: Mother Baby Anesthesia Type: Epidural Level of consciousness: awake and alert and oriented Pain management: satisfactory to patient Vital Signs Assessment: post-procedure vital signs reviewed and stable Respiratory status: respiratory function stable Cardiovascular status: stable Postop Assessment: no headache, no backache, epidural receding, patient able to bend at knees, no signs of nausea or vomiting, adequate PO intake and able to ambulate Anesthetic complications: no   No complications documented.  Last Vitals:  Vitals:   05/10/20 0830 05/10/20 1025  BP: 130/83 127/77  Pulse: 72 78  Resp: 18 18  Temp: 36.7 C 36.8 C  SpO2: 99% 98%    Last Pain:  Vitals:   05/10/20 1025  TempSrc: Oral  PainSc: 4    Pain Goal:                   Bernedette Auston

## 2020-05-10 NOTE — Progress Notes (Signed)
Spoke with patient regarding postpartum tubal ligation. Desires for PPD#1 so she may eat and relax today. Advised it can only be done 2PM. Also advised that tomorrow's physician would again review RBA with her including LARCs and interval BTL. Of note, patient BMI 53. Counseled pp BTL access may be difficult 2/2 habitus and h/o lap cholecystectomy and final decision lies with on-call. Aware.

## 2020-05-10 NOTE — Anesthesia Preprocedure Evaluation (Signed)
Anesthesia Evaluation  Patient identified by MRN, date of birth, ID band Patient awake    Reviewed: Allergy & Precautions, NPO status , Patient's Chart, lab work & pertinent test results  Airway Mallampati: III  TM Distance: >3 FB Neck ROM: Full    Dental no notable dental hx. (+) Teeth Intact   Pulmonary asthma , sleep apnea , former smoker,    Pulmonary exam normal breath sounds clear to auscultation       Cardiovascular hypertension, +CHF  Normal cardiovascular exam Rhythm:Regular Rate:Normal  3/1/ 21 Echo  Follow up of Post partum, Fluid overload 2018  Left Ventricle: Left ventricular ejection fraction, by estimation, is 55 to 60%. Left ventricular ejection fraction by 3D volume is 56 % The left ventricle has normal function. The left ventricle has no regional wall motion abnormalities. The average left ventricular global longitudinal strain is -21.5 %. The left ventricular internal cavity size was mildly dilated. There is no left ventricular hypertrophy. Left ventricular diastolic parameters were normal. Right Ventricle: The right ventricular size is normal. No increase in right ventricular wall thickness. Right ventricular systolic function is normal. Tricuspid regurgitation signal is inadequate for assessing PA   Neuro/Psych  Headaches, Bipolar Disorder    GI/Hepatic negative GI ROS, Neg liver ROS, GERD  ,  Endo/Other  Morbid obesity  Renal/GU negative Renal ROS     Musculoskeletal   Abdominal (+) + obese,   Peds  Hematology Lab Results      Component                Value               Date                      WBC                      10.9 (H)            05/10/2020                HGB                      11.8 (L)            05/10/2020                HCT                      36.6                05/10/2020                MCV                      86.5                05/10/2020                PLT                       273                 05/10/2020              Anesthesia Other Findings   Reproductive/Obstetrics (+) Pregnancy  Anesthesia Physical Anesthesia Plan  ASA: III  Anesthesia Plan: Epidural   Post-op Pain Management:    Induction:   PONV Risk Score and Plan:   Airway Management Planned:   Additional Equipment:   Intra-op Plan:   Post-operative Plan:   Informed Consent: I have reviewed the patients History and Physical, chart, labs and discussed the procedure including the risks, benefits and alternatives for the proposed anesthesia with the patient or authorized representative who has indicated his/her understanding and acceptance.       Plan Discussed with:   Anesthesia Plan Comments: (38 wk G5P2 for LEA)       Anesthesia Quick Evaluation

## 2020-05-10 NOTE — Anesthesia Procedure Notes (Signed)
Epidural Patient location during procedure: OB Start time: 05/10/2020 1:49 AM End time: 05/10/2020 2:17 AM  Staffing Anesthesiologist: Barnet Glasgow, MD Performed: anesthesiologist   Preanesthetic Checklist Completed: patient identified, IV checked, site marked, risks and benefits discussed, surgical consent, monitors and equipment checked, pre-op evaluation and timeout performed  Epidural Patient position: sitting Prep: DuraPrep and site prepped and draped Patient monitoring: continuous pulse ox and blood pressure Approach: midline Location: L3-L4 Injection technique: LOR air  Needle:  Needle type: Tuohy  Needle gauge: 17 G Needle length: 9 cm and 9 Needle insertion depth: 9 cm Catheter type: closed end flexible Catheter size: 19 Gauge Catheter at skin depth: 15 and 16 cm Test dose: negative  Assessment Events: blood not aspirated, injection not painful, no injection resistance, no paresthesia and negative IV test  Additional Notes Patient identified. Risks/Benefits/Options discussed with patient including but not limited to bleeding, infection, nerve damage, paralysis, failed block, incomplete pain control, headache, blood pressure changes, nausea, vomiting, reactions to medication both or allergic, itching and postpartum back pain. Confirmed with bedside nurse the patient's most recent platelet count. Confirmed with patient that they are not currently taking any anticoagulation, have any bleeding history or any family history of bleeding disorders. Patient expressed understanding and wished to proceed. All questions were answered. Sterile technique was used throughout the entire procedure. Please see nursing notes for vital signs. Test dose was given through epidural needle and negative prior to continuing to dose epidural or start infusion. Warning signs of high block given to the patient including shortness of breath, tingling/numbness in hands, complete motor block, or any  concerning symptoms with instructions to call for help. Patient was given instructions on fall risk and not to get out of bed. All questions and concerns addressed with instructions to call with any issues. 3 Attempt (S) . Patient tolerated procedure well.
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
## 2020-05-11 ENCOUNTER — Telehealth: Payer: Self-pay | Admitting: Cardiology

## 2020-05-11 ENCOUNTER — Encounter (HOSPITAL_COMMUNITY)
Admission: AD | Disposition: A | Discharge status: Home / Self Care | Payer: Self-pay | Source: Home / Self Care | Attending: Obstetrics and Gynecology

## 2020-05-11 ENCOUNTER — Other Ambulatory Visit (HOSPITAL_COMMUNITY): Admission: RE | Admit: 2020-05-11 | Payer: Medicare HMO | Source: Ambulatory Visit

## 2020-05-11 SURGERY — LIGATION, FALLOPIAN TUBE, POSTPARTUM
Anesthesia: Choice

## 2020-05-11 MED ORDER — FUROSEMIDE 40 MG PO TABS: 20.0000 mg | ORAL_TABLET | Freq: Every day | ORAL | 0 refills | Status: DC

## 2020-05-11 MED ORDER — LABETALOL HCL 200 MG PO TABS: 400.0000 mg | ORAL_TABLET | Freq: Two times a day (BID) | ORAL | 0 refills | Status: DC

## 2020-05-11 MED ORDER — IBUPROFEN 600 MG PO TABS: 600.0000 mg | ORAL_TABLET | Freq: Four times a day (QID) | ORAL | 0 refills | Status: DC | PRN

## 2020-05-11 NOTE — Telephone Encounter (Signed)
I spoke with patient.  She had her baby yesterday.  Currently in hospital with plans for discharge today but patient wanted to check with cardiology prior to discharge to see if OK to go home today.  Patient reports other than slight headache which she attributes to epidural she is feeling well.  No shortness of breath.  Received lasix yesterday.  Reports BP has been stable.  Is to see OB in one week for BP check. Will forward to Dr Radford Pax.

## 2020-05-11 NOTE — Discharge Instructions (Signed)
Call office with any concerns [phone] 

## 2020-05-11 NOTE — Telephone Encounter (Signed)
Pt stated that she just had her baby. Wants to know if she should do next. Wants to know if she could come in the office to get a quick look over to make sure she is ok. Please call to discuss. Has appt on 06/28/20

## 2020-05-11 NOTE — Telephone Encounter (Signed)
Patient notified
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
## 2020-05-11 NOTE — Progress Notes (Signed)
CSW received update from J. Flemmings with [facility] [place] [facility] and was advised that there are no barriers to infant d/c with [name] once ready.    Virgie Dad Anwita Mencer, MSW, LCSW [facility] [phone]

## 2020-05-11 NOTE — Telephone Encounter (Signed)
Bp looks good in hospital so I think she is fine to be discharged

## 2020-05-11 NOTE — Discharge Summary (Signed)
Postpartum Discharge Summary  Date of Service updated      Patient Name: [name] DOB: 07-04-1986 MRN: [id number]  Date of admission: 05/09/2020 Delivery date:05/10/2020  Delivering provider: Willis Riley, TODD  Date of discharge: 05/11/2020  Admitting diagnosis: IUP at 47 weeks  Chronic hypertension H/O CHF Intrauterine pregnancy: [redacted]w[redacted]d    Secondary diagnosis:  Chronic hypertension; H/O CHF, morbid obesity in pregnancy  Additional problems: none    Discharge diagnosis: Term Pregnancy Delivered and CHTN                                              Post partum procedures:n/a Augmentation: AROM Complications: None  Hospital course: Onset of Labor With Vaginal Delivery      34y.o. yo [id number] admitted in Latent Labor on 05/09/2020. Patient had an uncomplicated labor course as follows:  Membrane Rupture Time/Date: 3:49 AM ,05/10/2020   Delivery Method:Vaginal, Spontaneous  Episiotomy: None  Lacerations:  None  Patient had an uncomplicated postpartum course.  She is ambulating, tolerating a regular diet, passing flatus, and urinating well. Patient is discharged home in stable condition on 05/11/20.  Newborn Data: Birth date:05/10/2020  Birth time:6:41 AM  Gender:Female  Living status:Living  Apgars:9 ,9  Weight:2841 g   Magnesium Sulfate received: No BMZ received: No Rhophylac:N/A MMR:N/A Lisa-DaP:Given prenatally Flu: N/A Transfusion:No  Physical exam  Vitals:   05/10/20 1600 05/10/20 1854 05/10/20 2337 05/11/20 0523  BP: 128/74 128/74 116/72 123/65  Pulse: 88 78 96 78  Resp: '18 18 19 19  ' Temp: 98.2 F (36.8 C) 98.2 F (36.8 C) 98.1 F (36.7 C) 97.9 F (36.6 C)  TempSrc: Oral Oral Oral Oral  SpO2: 97% 99% 99% 97%   General: alert, cooperative and no distress Lochia: appropriate Uterine Fundus: firm Incision: N/A DVT Evaluation: No evidence of DVT seen on physical exam. Calf/Ankle edema is present Labs: Lab Results  Component Value Date    WBC 10.9 (H) 05/10/2020   HGB 11.8 (L) 05/10/2020   HCT 36.6 05/10/2020   MCV 86.5 05/10/2020   PLT 273 05/10/2020   CMP Latest Ref Rng & Units 05/10/2020  Glucose 70 - 99 mg/dL 92  BUN 6 - 20 mg/dL 7  Creatinine 0.44 - 1.00 mg/dL 0.63  Sodium 135 - 145 mmol/L 136  Potassium 3.5 - 5.1 mmol/L 3.6  Chloride 98 - 111 mmol/L 105  CO2 22 - 32 mmol/L 20(L)  Calcium 8.9 - 10.3 mg/dL 9.3  Total Protein 6.5 - 8.1 g/dL 6.7  Total Bilirubin 0.3 - 1.2 mg/dL 0.7  Alkaline Phos 38 - 126 U/L 106  AST 15 - 41 U/L 18  ALT 0 - 44 U/L 17   Edinburgh Score: No flowsheet data found.    After visit meds:     Discharge home in stable condition Infant Feeding: Bottle and Breast Infant Disposition:home with mother Discharge instruction: per After Visit Summary and Postpartum booklet. Activity: Advance as tolerated. Pelvic rest for 6 weeks.  Diet: low salt diet Anticipated Birth Control: Plans Interval BTL Postpartum Appointment:3-4 weeks Additional Postpartum F/U: BP check 1 week Future Appointments: Future Appointments  Date Time Provider [place]9/21/2021 11:40 AM NeDorothy SparkMD [facility]   Follow up Visit:  Follow-up Information    [name], [name]. Schedule an appointment as soon as possible for  a visit.   Specialty: Obstetrics and Gynecology Why: Plan on BP check in 1 week and postpartum visit at 3-4 weeks to plan for interval tubal ligation Contact information: [address] [place] [address] [phone]                   05/11/2020 Lisa Serge, DO

## 2020-05-11 NOTE — Progress Notes (Signed)
Patient was having pain in her back due to epidural and pain in her hand at IV site. Epidural was left in for scheduled tubal on 05/11/20. She requested that her tubal be canceled and to have it done outpatient and have epidural removed. Called MD on call to inform them and got order for epidural removal.

## 2020-05-11 NOTE — Progress Notes (Signed)
Post Partum Day 1 Subjective: no complaints, up ad lib, voiding, tolerating PO, + flatus and lochia mild. Pt reports pain well controlled. Denies HA, SOB or CP. Pts IV came out overnight and she declined having it replaced. She reiterates wanting to have interval BTL instead of today as previously planned. Pt requests early discharge home as due to concerns with childcare. She reports feels well educated on signs/symptoms of CHF to look out for. Bonding well with baby.   Objective: Blood pressure 123/65, pulse 78, temperature 97.9 F (36.6 C), temperature source Oral, resp. rate 19, last menstrual period 08/11/2019, SpO2 97 %, unknown if currently breastfeeding.  Physical Exam:  General: alert, fatigued and no distress Lochia: appropriate Uterine Fundus: firm Incision: n/a DVT Evaluation: No evidence of DVT seen on physical exam. Calf/Ankle edema is present.  Recent Labs    05/10/20 0038  HGB 11.8*  HCT 36.6    Assessment/Plan: Discharge home and Contraception interval BTL  Plan on BP check in 1 week and postpartum visit at 3-4 weeks to plan for interval BTL   LOS: 1 day   Brandin Stetzer W Rhylee Nunn 05/11/2020, 8:18 AM

## 2020-05-13 ENCOUNTER — Inpatient Hospital Stay (HOSPITAL_COMMUNITY): Admit: 2020-05-13 | Payer: Medicare HMO | Admitting: Obstetrics and Gynecology

## 2020-05-13 ENCOUNTER — Other Ambulatory Visit: Payer: Self-pay

## 2020-05-13 ENCOUNTER — Encounter (HOSPITAL_COMMUNITY)
Admission: AD | Disposition: A | Discharge status: Home / Self Care | Payer: Self-pay | Source: Home / Self Care | Attending: Obstetrics and Gynecology

## 2020-05-13 ENCOUNTER — Encounter (HOSPITAL_COMMUNITY): Payer: Self-pay

## 2020-05-13 SURGERY — Surgical Case
Anesthesia: Spinal | Laterality: Bilateral

## 2020-05-13 SURGERY — Surgical Case
Anesthesia: Regional

## 2020-05-13 NOTE — Patient Outreach (Signed)
[facility]) Care Management  05/13/2020  [name] April 03, 1986 [id number]   Referral Date: 05/13/20 Referral Source: [employer] Report Date of Discharge: 05/11/20 Facility:  [place]: [facility]   Referral received.  No outreach warranted at this time.  Transition of Care calls being completed via EMMI. RN CM will outreach patient for any red flags received.    Plan: RN CM will close case.    Jone Baseman, RN, MSN [facility] Care Management Care Management Coordinator Direct Line [phone] Toll Free: [phone]  Fax: [phone]

## 2020-05-17 ENCOUNTER — Telehealth: Payer: Self-pay | Admitting: Cardiology

## 2020-05-17 DIAGNOSIS — Z3A37 37 weeks gestation of pregnancy: Secondary | ICD-10-CM | POA: Diagnosis not present

## 2020-05-17 DIAGNOSIS — O10013 Pre-existing essential hypertension complicating pregnancy, third trimester: Secondary | ICD-10-CM | POA: Diagnosis not present

## 2020-05-17 NOTE — Telephone Encounter (Signed)
Follow Up:    Pt is calling you back, says she have been to her OB.

## 2020-05-17 NOTE — Telephone Encounter (Signed)
Pt is calling in today to report current elevated BP readings, associated with blurred vision and headaches.  Pt is one week postpartum, giving birth to a healthy newborn on August 3rd.  Pt is inquiring what she should do as far as scheduling a follow-up with our office for this, or with her OBGYN. Asked the pt if she has seen her OBGYN since delivering last week.  Pt states she has her one week postpartum appt with her OBGYN today at 1100.  Advised the pt that she needs to proceed to that appt as scheduled, for this takes precedence over seeing our office today, being she is one week postpartum. Also informed her that there are no appts at our office today and [name] is out of the office for the week. Advised the pt to proceed to her scheduled appt with OBGYN today at 1100, take her recorded readings with her as well as logged symptoms, so that her OB can review this. Advised her to take her current med list in with her to that appt as well.  Advised the pt to touch base with our office later today, to report how her visit with her OB went. Advised her that if her symptoms worsen between now and her appt at 11am, then she should seek immediate medical assistance, or go to the ER.  Pt verbalized understanding and agrees with this plan. Pt states she will call us back to report how her OB appt went.  Will send this message to Dr Meda Coffee as a general FYI, to make her aware of this plan.

## 2020-05-17 NOTE — Telephone Encounter (Signed)
Agree with plan 

## 2020-05-17 NOTE — Telephone Encounter (Signed)
Pt called back and stated her OBGYN Dr. Willis Modena was able to see her earlier this morning vs at 1100, for one week postpartum follow-up appt.   Pt states she just left his office a few mins ago.  Pt states Dr. Willis Modena drew some labs and collected a urine specimen on her.  Pt states he did not make any med changes in regards to her elevated blood pressure readings she recorded and provided him, and as noted in this message.  Pt states that Dr. Willis Modena advised her to call our office back and have [place] adjust her meds for her elevated blood pressure readings.  Pt states that's why she is calling back now.  Pt has been seen in our BP clinic by our Pharmacist in the past, and during her pregnancy.   Informed the pt that I will route this message to our Pharmacist to see if they could assist with med management, or schedule an appt in their clinic, for noted elevated BP readings, one week postpartum. Did confirm with the pt that she is taking all her cardiac medications as prescribed, and she is.  Asked the pt if she is breastfeeding, and she confirmed that she is NOT breastfeeding.  Informed the pt that our Pharmacist or myself will follow back up with her, with further recommendations on this matter.  Advised her in the meantime, until further advisement is provided, she should go home, take her morning meds, hydrate well, rest, and limit her salt intake.  Pt verbalized understanding and agrees with this plan.

## 2020-05-17 NOTE — Telephone Encounter (Signed)
Pt c/o BP issue: STAT if pt c/o blurred vision, one-sided weakness or slurred speech  1. What are your last 5 BP readings?  164/104, 151/103, 136/102, 151/95 all of these readings today 170/105 this weekend  2. Are you having any other symptoms (ex. Dizziness, headache, blurred vision, passed out)? Bad headache, and neck hurt and sometimes vision is blurred  3. What is your BP issue? Blood Pressure is high- pt wants to be seen

## 2020-05-17 NOTE — Telephone Encounter (Signed)
error 

## 2020-05-17 NOTE — Telephone Encounter (Signed)
Attempted to call patient.  No answer, unable to leave VM, mailbox full

## 2020-05-17 NOTE — Telephone Encounter (Signed)
Spoke with patient.  Reports her home BP today was 170/111 and BP at OB was 159/103.  (Have tried to get in touch with OB but they have not answered yet).  Patient remains on labetalol 400 mg BID and amlodipine 10 mg daily.  Reports is sleeping well, does not have much appetite.  Is not breastfeeding.  Has been drinking water, protein shakes, and pedialyte. Patient has lisinopril 10 mg at home from before she was pregnant.  Recommended patient restart lisinopril 10 mg in addition to current HTN medications and recommended to decrease Pedialyte due to salt content.  Patient has lab work pending from [employer].  Will check back with patient on Friday.

## 2020-05-20 ENCOUNTER — Telehealth: Payer: Self-pay | Admitting: Pharmacist

## 2020-05-20 NOTE — Telephone Encounter (Signed)
Called and spoke with patient.  Blood pressure has decreased since starting lisinopril.  BP now ranging 145-150/85.  Reports no fluid retention or SOB.  Has discontinued Pedialyte.  Recommended increase lisinopril to 20 mg and will call back Monday 8/16 to recheck

## 2020-05-23 ENCOUNTER — Telehealth: Payer: Self-pay | Admitting: Pharmacist

## 2020-05-23 NOTE — Telephone Encounter (Signed)
Spoke with patient.  Blood pressure in 140s-10s/80s.  Has bene taking lisinopril 10 mg BID.  Advised patient to take both lisinopril 10 mg tablets at the same time.  Will call to check on patient later this week.  Recommended increase water intake since patient reports she has been sweating.

## 2020-05-26 ENCOUNTER — Telehealth: Payer: Self-pay | Admitting: Pharmacist

## 2020-05-26 DIAGNOSIS — G4733 Obstructive sleep apnea (adult) (pediatric): Secondary | ICD-10-CM | POA: Diagnosis not present

## 2020-05-26 NOTE — Telephone Encounter (Signed)
LMOM checking on patient's BP readings

## 2020-05-29 ENCOUNTER — Ambulatory Visit (HOSPITAL_COMMUNITY)
Admission: EM | Admit: 2020-05-29 | Discharge: 2020-05-29 | Disposition: A | Discharge status: Home / Self Care | Payer: Medicare HMO | Attending: Emergency Medicine | Admitting: Emergency Medicine

## 2020-05-29 ENCOUNTER — Encounter (HOSPITAL_COMMUNITY): Payer: Self-pay

## 2020-05-29 ENCOUNTER — Other Ambulatory Visit: Payer: Self-pay

## 2020-05-29 DIAGNOSIS — Z20822 Contact with and (suspected) exposure to covid-19: Secondary | ICD-10-CM | POA: Diagnosis not present

## 2020-05-29 DIAGNOSIS — J069 Acute upper respiratory infection, unspecified: Secondary | ICD-10-CM | POA: Diagnosis not present

## 2020-05-29 DIAGNOSIS — F319 Bipolar disorder, unspecified: Secondary | ICD-10-CM | POA: Insufficient documentation

## 2020-05-29 DIAGNOSIS — Z9049 Acquired absence of other specified parts of digestive tract: Secondary | ICD-10-CM | POA: Diagnosis not present

## 2020-05-29 DIAGNOSIS — Z791 Long term (current) use of non-steroidal anti-inflammatories (NSAID): Secondary | ICD-10-CM | POA: Insufficient documentation

## 2020-05-29 DIAGNOSIS — M1990 Unspecified osteoarthritis, unspecified site: Secondary | ICD-10-CM | POA: Diagnosis not present

## 2020-05-29 DIAGNOSIS — I110 Hypertensive heart disease with heart failure: Secondary | ICD-10-CM | POA: Insufficient documentation

## 2020-05-29 DIAGNOSIS — Z87891 Personal history of nicotine dependence: Secondary | ICD-10-CM | POA: Insufficient documentation

## 2020-05-29 DIAGNOSIS — F419 Anxiety disorder, unspecified: Secondary | ICD-10-CM | POA: Insufficient documentation

## 2020-05-29 DIAGNOSIS — G4733 Obstructive sleep apnea (adult) (pediatric): Secondary | ICD-10-CM | POA: Diagnosis not present

## 2020-05-29 DIAGNOSIS — Z8614 Personal history of Methicillin resistant Staphylococcus aureus infection: Secondary | ICD-10-CM | POA: Insufficient documentation

## 2020-05-29 DIAGNOSIS — Z79899 Other long term (current) drug therapy: Secondary | ICD-10-CM | POA: Diagnosis not present

## 2020-05-29 DIAGNOSIS — I5032 Chronic diastolic (congestive) heart failure: Secondary | ICD-10-CM | POA: Insufficient documentation

## 2020-05-29 DIAGNOSIS — G43909 Migraine, unspecified, not intractable, without status migrainosus: Secondary | ICD-10-CM | POA: Insufficient documentation

## 2020-05-29 DIAGNOSIS — K219 Gastro-esophageal reflux disease without esophagitis: Secondary | ICD-10-CM | POA: Insufficient documentation

## 2020-05-29 DIAGNOSIS — Z8249 Family history of ischemic heart disease and other diseases of the circulatory system: Secondary | ICD-10-CM | POA: Insufficient documentation

## 2020-05-29 DIAGNOSIS — A6000 Herpesviral infection of urogenital system, unspecified: Secondary | ICD-10-CM | POA: Diagnosis not present

## 2020-05-29 DIAGNOSIS — Z888 Allergy status to other drugs, medicaments and biological substances status: Secondary | ICD-10-CM | POA: Diagnosis not present

## 2020-05-29 DIAGNOSIS — J4520 Mild intermittent asthma, uncomplicated: Secondary | ICD-10-CM | POA: Diagnosis not present

## 2020-05-29 MED ORDER — DM-GUAIFENESIN ER 30-600 MG PO TB12: 1.0000 | ORAL_TABLET | Freq: Two times a day (BID) | ORAL | 0 refills | Status: DC

## 2020-05-29 MED ORDER — BENZONATATE 200 MG PO CAPS: 200.0000 mg | ORAL_CAPSULE | Freq: Three times a day (TID) | ORAL | 0 refills | Status: AC | PRN
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
## 2020-05-29 NOTE — Discharge Instructions (Signed)
Covid test pending, monitor my chart for results Tessalon/benzonatate every 8 hours as needed for cough Mucinex DM twice daily to help with congestion and cough Rest and fluids Follow-up if any symptoms not improving or worsening

## 2020-05-29 NOTE — ED Triage Notes (Signed)
Pt present cough with congestion and a headache. Symptoms started two days ago.

## 2020-05-30 LAB — SARS CORONAVIRUS 2 (TAT 6-24 HRS): SARS Coronavirus 2: NEGATIVE

## 2020-05-31 ENCOUNTER — Other Ambulatory Visit: Payer: Self-pay | Admitting: Cardiology

## 2020-05-31 MED ORDER — LISINOPRIL 20 MG PO TABS
20.0000 mg | ORAL_TABLET | Freq: Every day | ORAL | 3 refills | Status: DC
Start: 2020-05-31 — End: 2020-06-03

## 2020-06-02 ENCOUNTER — Telehealth: Payer: Self-pay | Admitting: Cardiology

## 2020-06-02 ENCOUNTER — Telehealth: Payer: Self-pay

## 2020-06-02 NOTE — Telephone Encounter (Signed)
Will send this message to Truitt Merle NP and [name] as an Juluis Rainier.  Pt called earlier with complaints of elevated BP readings and c/o headache, dizziness, and low appetite.  This was advised on by our Pharmacist in BP clinic, for the pt has been established with them for quite sometime. See telephone note from earlier today, in regards to this.

## 2020-06-02 NOTE — Telephone Encounter (Signed)
Follow Up:   Pt said she contacted her OB doctor today and was told to contact her Cardiologist. She said they said they had did all that they could do for her.
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
## 2020-06-02 NOTE — Telephone Encounter (Signed)
-----   Message from Burtis Junes, NP sent at 06/02/2020 12:58 PM EDT ----- Yes - she could be spilling protein - would not be good to be waiting to next week.....  lori ----- Message ----- From: Michaelyn Barter, RN Sent: 06/02/2020  12:34 PM EDT To: Burtis Junes, NP  Bary Castilla,  Patient stated she did follow up with her OB. She stated her last appointment with them her BP was good, but it has started going back up. Encouraged her to call OB to let them know when her BP goes up.    Pam ----- Message ----- From: Burtis Junes, NP Sent: 06/02/2020  12:22 PM EDT To: Rebeca Alert [address] Triage  Can someone call her - ask her if she has seen her OB and has she let them know as well about rise in blood pressure?   Thanks Cecille Rubin

## 2020-06-02 NOTE — Telephone Encounter (Signed)
Called and spoke with patient.  Reports blood pressure has been elevated and her head has hurt since yesterday.  Went to urgent care over the weekend for URI symptoms and sent home with cough medications.  Has not felt the need to take any.  Covid test negative.  Reports she has been staying hydrated and avoiding salt.  Is sleeping well.  Currently on amlodipine 10 mg, labetalol 400 mg BID, and lisinopril 20 mg daily.  (Two ten milligram tablets).  HR in low 60s.    Has not eaten anything today.  Since BP has been decreasing since starting and titrating lisinopril, recommend increasing to 30mg  daily.  Will call back in afternoon to check on patient

## 2020-06-02 NOTE — Telephone Encounter (Signed)
Will forward this to Truitt Merle and [name] as an Juluis Rainier, about pts BP readings improving with recommendations provided by Karren Cobble [facility].

## 2020-06-02 NOTE — Telephone Encounter (Signed)
Pt is calling in today to inform [name] and our BP clinic that over the last day, her BP has been increasing, with complaints of headache, dizziness, and low appetite.  Pt states she took her BP this morning before med administration and it was 138/100 with HR-63.  She states she then took her medications, waited a full hour, and took her BP again.  She states her BP at that time was 134/91 HR- 64.  Pt states she still has same complaints as mentioned above, even with medications on board.  Pt states she is mostly concerned with her diastolic number.  Pt voiced no other cardiac complaints.  She states she is maintaining a low sodium diet.  She does not complain of swelling in her upper or lower extremities.  Pt recently gave birth.  She is not breastfeeding.  Pt does have a scheduled office visit with Truitt Merle NP next week on 8/31.  Advised her to follow-up as scheduled with Cecille Rubin.  Informed the pt that I will route this message to [name] and our Pharmacist in BP clinic, to further review and advise on.  Informed the pt that I will follow-up with her shortly thereafter, once further advisement is provided. Pt verbalized understanding and agrees with this plan.

## 2020-06-02 NOTE — Telephone Encounter (Signed)
Pt c/o BP issue: STAT if pt c/o blurred vision, one-sided weakness or slurred speech  1. What are your last 5 BP readings? 138/100  2. Are you having any other symptoms (ex. Dizziness, headache, blurred vision, passed out)? Dizziness, headache  3. What is your BP issue? Patient states her BP has been high and she has been having headaches and dizziness.

## 2020-06-02 NOTE — Telephone Encounter (Signed)
Called and spoke with patient.  Rechecked BP on phone: 129/88.  Improved from this morning.  Advised patient to continue lisinopril 30 mg and will follow up tomorrow.

## 2020-06-03 ENCOUNTER — Telehealth: Payer: Self-pay | Admitting: Pharmacist

## 2020-06-03 NOTE — Telephone Encounter (Signed)
Spoke with patient.  Reports she feels better.  BP 129/85.  Recommended patient to continue to stay hydrated and avoid sodium

## 2020-06-03 NOTE — Telephone Encounter (Signed)
Spoke with patient.  BP 138/89, took meds about 1 hour ago before she took daughter to school.  Will call back this afternoon to recheck BP

## 2020-06-06 DIAGNOSIS — E782 Mixed hyperlipidemia: Secondary | ICD-10-CM | POA: Diagnosis not present

## 2020-06-06 DIAGNOSIS — Z720 Tobacco use: Secondary | ICD-10-CM | POA: Diagnosis not present

## 2020-06-06 DIAGNOSIS — J302 Other seasonal allergic rhinitis: Secondary | ICD-10-CM | POA: Diagnosis not present

## 2020-06-06 DIAGNOSIS — R1013 Epigastric pain: Secondary | ICD-10-CM | POA: Diagnosis not present

## 2020-06-06 DIAGNOSIS — R7303 Prediabetes: Secondary | ICD-10-CM | POA: Diagnosis not present

## 2020-06-06 DIAGNOSIS — F3341 Major depressive disorder, recurrent, in partial remission: Secondary | ICD-10-CM | POA: Diagnosis not present

## 2020-06-06 DIAGNOSIS — I10 Essential (primary) hypertension: Secondary | ICD-10-CM | POA: Diagnosis not present

## 2020-06-06 DIAGNOSIS — J45909 Unspecified asthma, uncomplicated: Secondary | ICD-10-CM | POA: Diagnosis not present

## 2020-06-07 ENCOUNTER — Ambulatory Visit (INDEPENDENT_AMBULATORY_CARE_PROVIDER_SITE_OTHER): Payer: Medicare HMO | Admitting: Nurse Practitioner

## 2020-06-07 ENCOUNTER — Other Ambulatory Visit: Payer: Self-pay

## 2020-06-07 ENCOUNTER — Encounter: Payer: Self-pay | Admitting: Nurse Practitioner

## 2020-06-07 VITALS — BP 130/80 | HR 76 | Ht 65.0 in | Wt 294.8 lb

## 2020-06-07 DIAGNOSIS — R600 Localized edema: Secondary | ICD-10-CM | POA: Diagnosis not present

## 2020-06-07 DIAGNOSIS — I110 Hypertensive heart disease with heart failure: Secondary | ICD-10-CM

## 2020-06-07 MED ORDER — LISINOPRIL 10 MG PO TABS: 10.0000 mg | ORAL_TABLET | Freq: Every day | ORAL | 6 refills | Status: DC

## 2020-06-07 MED ORDER — FUROSEMIDE 40 MG PO TABS: 20.0000 mg | ORAL_TABLET | Freq: Every day | ORAL | 0 refills | Status: DC | PRN

## 2020-06-07 NOTE — Patient Instructions (Addendum)
After Visit Summary:  We will be checking the following labs today - NONE  We will get your labs from your PCP    Medication Instructions:    Continue with your current medicines.    If you need a refill on your cardiac medications before your next appointment, please call your pharmacy.     Testing/Procedures To Be Arranged:  N/A  Follow-Up:   See [name] in 3 to 4 months    At [facility], you and your health needs are our priority.  As part of our continuing mission to provide you with exceptional heart care, we have created designated Provider Care Teams.  These Care Teams include your primary Cardiologist (physician) and Advanced Practice Providers (APPs -  Physician Assistants and Nurse Practitioners) who all work together to provide you with the care you need, when you need it.  Special Instructions:  . Stay safe, wash your hands for at least 20 seconds and wear a mask when needed.  . It was good to talk with you today.    Call the [place] office at [phone] if you have any questions, problems or concerns.

## 2020-06-28 ENCOUNTER — Ambulatory Visit: Payer: Medicare HMO | Admitting: Cardiology

## 2020-07-01 ENCOUNTER — Ambulatory Visit: Admit: 2020-07-01 | Payer: Medicare HMO | Admitting: Obstetrics and Gynecology

## 2020-07-01 SURGERY — SALPINGECTOMY, BILATERAL, LAPAROSCOPIC
Anesthesia: General | Laterality: Bilateral

## 2020-07-14 DIAGNOSIS — G4733 Obstructive sleep apnea (adult) (pediatric): Secondary | ICD-10-CM | POA: Diagnosis not present

## 2020-08-01 DIAGNOSIS — F3341 Major depressive disorder, recurrent, in partial remission: Secondary | ICD-10-CM | POA: Diagnosis not present

## 2020-08-01 DIAGNOSIS — R7303 Prediabetes: Secondary | ICD-10-CM | POA: Diagnosis not present

## 2020-08-01 DIAGNOSIS — I10 Essential (primary) hypertension: Secondary | ICD-10-CM | POA: Diagnosis not present

## 2020-08-01 DIAGNOSIS — R1013 Epigastric pain: Secondary | ICD-10-CM | POA: Diagnosis not present

## 2020-08-01 DIAGNOSIS — J302 Other seasonal allergic rhinitis: Secondary | ICD-10-CM | POA: Diagnosis not present

## 2020-08-01 DIAGNOSIS — J45909 Unspecified asthma, uncomplicated: Secondary | ICD-10-CM | POA: Diagnosis not present

## 2020-08-01 DIAGNOSIS — E782 Mixed hyperlipidemia: Secondary | ICD-10-CM | POA: Diagnosis not present

## 2020-08-01 DIAGNOSIS — Z720 Tobacco use: Secondary | ICD-10-CM | POA: Diagnosis not present

## 2020-08-02 NOTE — Progress Notes (Signed)
[place] [place] - [address] [place] [address] Phone: [phone] Fax: [phone]  [facility] [place], [place] [address] Phone: [phone] Fax: [phone]      Your procedure is scheduled on Friday, October 29th.  Report to [facility] Main Entrance "A" at 5:30 A.M., and check in at the Admitting office.  Call this number if you have problems the morning of surgery:  [phone]  Call [phone] if you have any questions prior to your surgery date Monday-Friday 8am-4pm    Remember:  Do not eat after midnight the night before your surgery  You may drink clear liquids until 4:30 AM the morning of your surgery.   Clear liquids allowed are: Water, Non-Citrus Juices (without pulp), Carbonated Beverages, Clear Tea, Black Coffee Only, and Gatorade    Take these medicines the morning of surgery with A SIP OF WATER   Tylenol - if needed  Albuterol inhaler - if needed (bring with you on day of surgery)  Alprazolam (Xanax)  Amlodipine (Norvasc)  Fluoxetine (Prozac)  Labetalol  Singulair - if needed  Wixela inhaler   As of today, STOP taking any Aspirin (unless otherwise instructed by your surgeon) Aleve, Naproxen, Ibuprofen, Motrin, Advil, Goody's, BC's, all herbal medications, fish oil, and all vitamins.                      Do not wear jewelry, make up, or nail polish            Do not wear lotions, powders, perfumes, or deodorant.            Do not shave 48 hours prior to surgery.              Do not bring valuables to the hospital.            [facility] is not responsible for any belongings or valuables.  Do NOT Smoke (Tobacco/Vaping) or drink Alcohol 24 hours prior to your procedure If you use a CPAP at night, you may bring all equipment for your overnight stay.   Contacts, glasses, dentures or bridgework may not  be worn into surgery.      For patients admitted to the hospital, discharge time will be determined by your treatment team.   Patients discharged the day of surgery will not be allowed to drive home, and someone needs to stay with them for 24 hours.    Special instructions:   [place]- Preparing For Surgery  Before surgery, you can play an important role. Because skin is not sterile, your skin needs to be as free of germs as possible. You can reduce the number of germs on your skin by washing with CHG (chlorahexidine gluconate) Soap before surgery.  CHG is an antiseptic cleaner which kills germs and bonds with the skin to continue killing germs even after washing.    Oral Hygiene is also important to reduce your risk of infection.  Remember - BRUSH YOUR TEETH THE MORNING OF SURGERY WITH YOUR REGULAR TOOTHPASTE  Please do not use if you have an allergy to CHG or antibacterial soaps. If your skin becomes reddened/irritated stop using the CHG.  Do not shave (including legs and underarms) for at least 48 hours prior to first CHG shower. It is OK to shave your face.  Please  follow these instructions carefully.   1. Shower the NIGHT BEFORE SURGERY and the MORNING OF SURGERY with CHG Soap.   2. If you chose to wash your hair, wash your hair first as usual with your normal shampoo.  3. After you shampoo, rinse your hair and body thoroughly to remove the shampoo.  4. Use CHG as you would any other liquid soap. You can apply CHG directly to the skin and wash gently with a scrungie or a clean washcloth.   5. Apply the CHG Soap to your body ONLY FROM THE NECK DOWN.  Do not use on open wounds or open sores. Avoid contact with your eyes, ears, mouth and genitals (private parts). Wash Face and genitals (private parts)  with your normal soap.   6. Wash thoroughly, paying special attention to the area where your surgery will be performed.  7. Thoroughly rinse your body with warm water from the neck  down.  8. DO NOT shower/wash with your normal soap after using and rinsing off the CHG Soap.  9. Pat yourself dry with a CLEAN TOWEL.  10. Wear CLEAN PAJAMAS to bed the night before surgery  11. Place CLEAN SHEETS on your bed the night of your first shower and DO NOT SLEEP WITH PETS.   Day of Surgery: Wear Clean/Comfortable clothing the morning of surgery Do not apply any deodorants/lotions.   Remember to brush your teeth WITH YOUR REGULAR TOOTHPASTE.   Please read over the following fact sheets that you were given.

## 2020-08-03 ENCOUNTER — Inpatient Hospital Stay (HOSPITAL_COMMUNITY): Admission: RE | Admit: 2020-08-03 | Payer: Medicare HMO | Source: Ambulatory Visit

## 2020-08-03 ENCOUNTER — Inpatient Hospital Stay (HOSPITAL_COMMUNITY)
Admission: RE | Admit: 2020-08-03 | Discharge: 2020-08-03 | Disposition: A | Discharge status: Home / Self Care | Payer: Medicare HMO | Source: Ambulatory Visit

## 2020-08-05 DIAGNOSIS — Z202 Contact with and (suspected) exposure to infections with a predominantly sexual mode of transmission: Secondary | ICD-10-CM | POA: Diagnosis not present

## 2020-08-06 DIAGNOSIS — Z202 Contact with and (suspected) exposure to infections with a predominantly sexual mode of transmission: Secondary | ICD-10-CM | POA: Diagnosis not present

## 2020-08-08 DIAGNOSIS — S0990XA Unspecified injury of head, initial encounter: Secondary | ICD-10-CM | POA: Diagnosis not present

## 2020-08-08 DIAGNOSIS — R51 Headache with orthostatic component, not elsewhere classified: Secondary | ICD-10-CM | POA: Diagnosis not present

## 2020-08-08 DIAGNOSIS — M542 Cervicalgia: Secondary | ICD-10-CM | POA: Diagnosis not present

## 2020-08-08 DIAGNOSIS — M546 Pain in thoracic spine: Secondary | ICD-10-CM | POA: Diagnosis not present

## 2020-08-08 DIAGNOSIS — S299XXA Unspecified injury of thorax, initial encounter: Secondary | ICD-10-CM | POA: Diagnosis not present

## 2020-08-08 DIAGNOSIS — S199XXA Unspecified injury of neck, initial encounter: Secondary | ICD-10-CM | POA: Diagnosis not present

## 2020-08-08 DIAGNOSIS — R519 Headache, unspecified: Secondary | ICD-10-CM | POA: Diagnosis not present

## 2020-08-08 DIAGNOSIS — M549 Dorsalgia, unspecified: Secondary | ICD-10-CM | POA: Diagnosis not present

## 2020-08-08 DIAGNOSIS — I10 Essential (primary) hypertension: Secondary | ICD-10-CM | POA: Diagnosis not present

## 2020-08-08 DIAGNOSIS — Z79899 Other long term (current) drug therapy: Secondary | ICD-10-CM | POA: Diagnosis not present

## 2020-08-08 DIAGNOSIS — M25519 Pain in unspecified shoulder: Secondary | ICD-10-CM | POA: Diagnosis not present

## 2020-08-11 DIAGNOSIS — I10 Essential (primary) hypertension: Secondary | ICD-10-CM | POA: Diagnosis not present

## 2020-08-11 DIAGNOSIS — R1013 Epigastric pain: Secondary | ICD-10-CM | POA: Diagnosis not present

## 2020-08-11 DIAGNOSIS — E782 Mixed hyperlipidemia: Secondary | ICD-10-CM | POA: Diagnosis not present

## 2020-08-11 DIAGNOSIS — F439 Reaction to severe stress, unspecified: Secondary | ICD-10-CM | POA: Diagnosis not present

## 2020-08-11 DIAGNOSIS — Z720 Tobacco use: Secondary | ICD-10-CM | POA: Diagnosis not present

## 2020-08-11 DIAGNOSIS — J302 Other seasonal allergic rhinitis: Secondary | ICD-10-CM | POA: Diagnosis not present

## 2020-08-11 DIAGNOSIS — R7303 Prediabetes: Secondary | ICD-10-CM | POA: Diagnosis not present

## 2020-08-11 DIAGNOSIS — F3341 Major depressive disorder, recurrent, in partial remission: Secondary | ICD-10-CM | POA: Diagnosis not present

## 2020-08-11 DIAGNOSIS — J45909 Unspecified asthma, uncomplicated: Secondary | ICD-10-CM | POA: Diagnosis not present

## 2020-08-15 DIAGNOSIS — Z01818 Encounter for other preprocedural examination: Secondary | ICD-10-CM | POA: Diagnosis not present

## 2020-08-15 DIAGNOSIS — Z0189 Encounter for other specified special examinations: Secondary | ICD-10-CM | POA: Diagnosis not present

## 2020-08-18 ENCOUNTER — Inpatient Hospital Stay (HOSPITAL_COMMUNITY)
Admission: RE | Admit: 2020-08-18 | Discharge: 2020-08-18 | Disposition: A | Discharge status: Home / Self Care | Payer: Medicare HMO | Source: Ambulatory Visit

## 2020-08-18 ENCOUNTER — Inpatient Hospital Stay (HOSPITAL_COMMUNITY): Admission: RE | Admit: 2020-08-18 | Payer: Medicare HMO | Source: Ambulatory Visit

## 2020-08-18 NOTE — Progress Notes (Signed)
Your procedure is scheduled on Monday, November 15th.  Report to [facility] Main Entrance "A" at 5:30 A.M., and check in at the Admitting office.  Call this number if you have problems the morning of surgery:  [phone]  Call [phone] if you have any questions prior to your surgery date Monday-Friday 8am-4pm   Remember:  Do not eat after midnight the night before your surgery  You may drink clear liquids until 4:30 AM the morning of your surgery.   Clear liquids allowed are: Water, Non-Citrus Juices (without pulp), Carbonated Beverages, Clear Tea, Black Coffee Only, and Gatorade    Take these medicines the morning of surgery with A SIP OF WATER   Amlodipine (Norvasc)  Fluoxetine (Prozac)  labetalol (NORMODYNE)   Wixela inhaler   If needed: acetaminophen (TYLENOL), Alprazolam (Xanax) Albuterol inhaler - if needed (bring with you on day of surgery)    As of today, STOP taking any Aspirin (unless otherwise instructed by your surgeon) Aleve, Naproxen, Ibuprofen, Motrin, Advil, Goody's, BC's, all herbal medications, fish oil, and all vitamins.                     Do not wear jewelry, make up, or nail polish            Do not wear lotions, powders, perfumes, or deodorant.            Do not shave 48 hours prior to surgery.              Do not bring valuables to the hospital.            [facility] is not responsible for any belongings or valuables.  Do NOT Smoke (Tobacco/Vaping) or drink Alcohol 24 hours prior to your procedure If you use a CPAP at night, you may bring all equipment for your overnight stay.   Contacts, glasses, dentures or bridgework may not be worn into surgery.      For patients admitted to the hospital, discharge time will be determined by your treatment team.   Patients discharged the day of surgery will not be allowed to drive home, and someone needs to stay with them for 24 hours.  Special instructions:   [place]- Preparing For Surgery  Before  surgery, you can play an important role. Because skin is not sterile, your skin needs to be as free of germs as possible. You can reduce the number of germs on your skin by washing with CHG (chlorahexidine gluconate) Soap before surgery.  CHG is an antiseptic cleaner which kills germs and bonds with the skin to continue killing germs even after washing.    Oral Hygiene is also important to reduce your risk of infection.  Remember - BRUSH YOUR TEETH THE MORNING OF SURGERY WITH YOUR REGULAR TOOTHPASTE  Please do not use if you have an allergy to CHG or antibacterial soaps. If your skin becomes reddened/irritated stop using the CHG.  Do not shave (including legs and underarms) for at least 48 hours prior to first CHG shower. It is OK to shave your face.  Please follow these instructions carefully.   1. Shower the NIGHT BEFORE SURGERY and the MORNING OF SURGERY with CHG Soap.   2. If you chose to wash your hair, wash your hair first as usual with your normal shampoo.  3. After you shampoo, rinse your hair and body thoroughly to remove the shampoo.  4. Use CHG as you would any other liquid soap.  You can apply CHG directly to the skin and wash gently with a scrungie or a clean washcloth.   5. Apply the CHG Soap to your body ONLY FROM THE NECK DOWN.  Do not use on open wounds or open sores. Avoid contact with your eyes, ears, mouth and genitals (private parts). Wash Face and genitals (private parts)  with your normal soap.   6. Wash thoroughly, paying special attention to the area where your surgery will be performed.  7. Thoroughly rinse your body with warm water from the neck down.  8. DO NOT shower/wash with your normal soap after using and rinsing off the CHG Soap.  9. Pat yourself dry with a CLEAN TOWEL.  10. Wear CLEAN PAJAMAS to bed the night before surgery  11. Place CLEAN SHEETS on your bed the night of your first shower and DO NOT SLEEP WITH PETS.  Day of Surgery: Wear  Clean/Comfortable clothing the morning of surgery Do not apply any deodorants/lotions.   Remember to brush your teeth WITH YOUR REGULAR TOOTHPASTE.   Please read over the following fact sheets that you were given.

## 2020-08-19 ENCOUNTER — Telehealth: Payer: Self-pay | Admitting: Cardiology

## 2020-08-19 ENCOUNTER — Encounter (HOSPITAL_COMMUNITY): Payer: Self-pay | Admitting: Obstetrics and Gynecology

## 2020-08-19 ENCOUNTER — Other Ambulatory Visit (HOSPITAL_COMMUNITY)
Admission: RE | Admit: 2020-08-19 | Discharge: 2020-08-19 | Disposition: A | Discharge status: Home / Self Care | Payer: Medicare HMO | Source: Ambulatory Visit | Attending: Obstetrics and Gynecology | Admitting: Obstetrics and Gynecology

## 2020-08-19 ENCOUNTER — Other Ambulatory Visit: Payer: Self-pay

## 2020-08-19 DIAGNOSIS — Z20822 Contact with and (suspected) exposure to covid-19: Secondary | ICD-10-CM | POA: Diagnosis not present

## 2020-08-19 DIAGNOSIS — Z01812 Encounter for preprocedural laboratory examination: Secondary | ICD-10-CM | POA: Diagnosis not present

## 2020-08-19 LAB — SARS CORONAVIRUS 2 (TAT 6-24 HRS): SARS Coronavirus 2: NEGATIVE

## 2020-08-19 MED ORDER — SPIRONOLACTONE 25 MG PO TABS: 12.5000 mg | ORAL_TABLET | Freq: Every day | ORAL | 3 refills | Status: DC
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
## 2020-08-19 NOTE — Telephone Encounter (Signed)
I would add spironolactone 12.5 mg po daily to her regimen

## 2020-08-19 NOTE — Telephone Encounter (Signed)
Pt c/o BP issue: STAT if pt c/o blurred vision, one-sided weakness or slurred speech  1. What are your last 5 BP readings?  150/85  2. Are you having any other symptoms (ex. Dizziness, headache, blurred vision, passed out)? Discomfort in center of chest under breast & headache   3. What is your BP issue? [name] is calling stating she was recently seen at a [place] office about getting her tubes tied and they advised her to f/u with Cardiology due to her elevated BP. She reports this started occurring last week. Please advise.

## 2020-08-19 NOTE — Telephone Encounter (Signed)
Pt called in called back from the Dropped call with Otila Kluver.  Her best call back number is [phone]

## 2020-08-19 NOTE — Telephone Encounter (Signed)
Notified the patient about [name] medication recommendation and sent in a prescription.   [name] verbalized understanding
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
## 2020-08-19 NOTE — Addendum Note (Signed)
Addended by: Darrell Jewel on: 08/19/2020 04:25 PM   Modules accepted: Orders

## 2020-08-21 NOTE — Anesthesia Preprocedure Evaluation (Addendum)
Anesthesia Evaluation  Patient identified by MRN, date of birth, ID band Patient awake    Reviewed: Allergy & Precautions, H&P , NPO status , Patient's Chart, lab work & pertinent test results  History of Anesthesia Complications (+) Family history of anesthesia reaction  Airway Mallampati: II  TM Distance: >3 FB Neck ROM: Full    Dental no notable dental hx. (+) Teeth Intact, Dental Advisory Given   Pulmonary asthma , sleep apnea , former smoker,    Pulmonary exam normal breath sounds clear to auscultation       Cardiovascular Exercise Tolerance: Good hypertension, Pt. on medications  Rhythm:Regular Rate:Normal     Neuro/Psych  Headaches, Anxiety Depression Bipolar Disorder    GI/Hepatic negative GI ROS, Neg liver ROS,   Endo/Other  Morbid obesity  Renal/GU negative Renal ROS  negative genitourinary   Musculoskeletal  (+) Arthritis ,   Abdominal   Peds  Hematology negative hematology ROS (+)   Anesthesia Other Findings   Reproductive/Obstetrics negative OB ROS                            Anesthesia Physical Anesthesia Plan  ASA: III  Anesthesia Plan: General   Post-op Pain Management:    Induction: Intravenous  PONV Risk Score and Plan: 4 or greater and Ondansetron, Dexamethasone, Midazolam and Diphenhydramine  Airway Management Planned: Oral ETT  Additional Equipment:   Intra-op Plan:   Post-operative Plan: Extubation in OR  Informed Consent: I have reviewed the patients History and Physical, chart, labs and discussed the procedure including the risks, benefits and alternatives for the proposed anesthesia with the patient or authorized representative who has indicated his/her understanding and acceptance.     Dental advisory given  Plan Discussed with: CRNA  Anesthesia Plan Comments:        Anesthesia Quick Evaluation
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
## 2020-08-22 ENCOUNTER — Ambulatory Visit (HOSPITAL_COMMUNITY): Payer: Medicare HMO | Admitting: Physician Assistant

## 2020-08-22 ENCOUNTER — Ambulatory Visit (HOSPITAL_COMMUNITY)
Admission: RE | Admit: 2020-08-22 | Discharge: 2020-08-22 | Disposition: A | Discharge status: Home / Self Care | Payer: Medicare HMO | Attending: Obstetrics and Gynecology | Admitting: Obstetrics and Gynecology

## 2020-08-22 ENCOUNTER — Encounter (HOSPITAL_COMMUNITY): Payer: Self-pay | Admitting: Obstetrics and Gynecology

## 2020-08-22 ENCOUNTER — Ambulatory Visit (HOSPITAL_COMMUNITY): Payer: Medicare HMO | Admitting: Certified Registered Nurse Anesthetist

## 2020-08-22 ENCOUNTER — Other Ambulatory Visit: Payer: Self-pay

## 2020-08-22 ENCOUNTER — Encounter (HOSPITAL_COMMUNITY)
Admission: RE | Disposition: A | Discharge status: Home / Self Care | Payer: Self-pay | Source: Home / Self Care | Attending: Obstetrics and Gynecology

## 2020-08-22 DIAGNOSIS — Z302 Encounter for sterilization: Secondary | ICD-10-CM | POA: Insufficient documentation

## 2020-08-22 DIAGNOSIS — F909 Attention-deficit hyperactivity disorder, unspecified type: Secondary | ICD-10-CM | POA: Diagnosis not present

## 2020-08-22 DIAGNOSIS — G4733 Obstructive sleep apnea (adult) (pediatric): Secondary | ICD-10-CM | POA: Diagnosis not present

## 2020-08-22 DIAGNOSIS — Z87891 Personal history of nicotine dependence: Secondary | ICD-10-CM | POA: Insufficient documentation

## 2020-08-22 DIAGNOSIS — D259 Leiomyoma of uterus, unspecified: Secondary | ICD-10-CM | POA: Insufficient documentation

## 2020-08-22 HISTORY — PX: LAPAROSCOPIC BILATERAL SALPINGECTOMY: SHX5889

## 2020-08-22 HISTORY — DX: Pneumonia, unspecified organism: J18.9

## 2020-08-22 LAB — COMPREHENSIVE METABOLIC PANEL WITH GFR
ALT: 27 U/L (ref 0–44)
AST: 21 U/L (ref 15–41)
Albumin: 4.2 g/dL (ref 3.5–5.0)
Alkaline Phosphatase: 62 U/L (ref 38–126)
Anion gap: 11 (ref 5–15)
BUN: 13 mg/dL (ref 6–20)
CO2: 19 mmol/L — ABNORMAL LOW (ref 22–32)
Calcium: 9.5 mg/dL (ref 8.9–10.3)
Chloride: 106 mmol/L (ref 98–111)
Creatinine, Ser: 0.9 mg/dL (ref 0.44–1.00)
GFR, Estimated: >60 mL/min
Glucose, Bld: 92 mg/dL (ref 70–99)
Potassium: 3.9 mmol/L (ref 3.5–5.1)
Sodium: 136 mmol/L (ref 135–145)
Total Bilirubin: 0.4 mg/dL (ref 0.3–1.2)
Total Protein: 7.4 g/dL (ref 6.5–8.1)

## 2020-08-22 LAB — CBC
HCT: 40.6 % (ref 36.0–46.0)
Hemoglobin: 12.8 g/dL (ref 12.0–15.0)
MCH: 28.3 pg (ref 26.0–34.0)
MCHC: 31.5 g/dL (ref 30.0–36.0)
MCV: 89.6 fL (ref 80.0–100.0)
Platelets: 332 10*3/uL (ref 150–400)
RBC: 4.53 MIL/uL (ref 3.87–5.11)
RDW: 15.7 % — ABNORMAL HIGH (ref 11.5–15.5)
WBC: 6.5 10*3/uL (ref 4.0–10.5)
nRBC: 0 % (ref 0.0–0.2)

## 2020-08-22 LAB — POCT PREGNANCY, URINE: Preg Test, Ur: NEGATIVE

## 2020-08-22 SURGERY — SALPINGECTOMY, BILATERAL, LAPAROSCOPIC
Anesthesia: General | Site: Abdomen | Laterality: Bilateral

## 2020-08-22 MED ORDER — LIDOCAINE 2% (20 MG/ML) 5 ML SYRINGE
INTRAMUSCULAR | Status: AC
  Filled 2020-08-22: qty 5

## 2020-08-22 MED ORDER — LIDOCAINE 2% (20 MG/ML) 5 ML SYRINGE
INTRAMUSCULAR | Status: DC | PRN
  Administered 2020-08-22: 100 mg via INTRAVENOUS

## 2020-08-22 MED ORDER — DIPHENHYDRAMINE HCL 50 MG/ML IJ SOLN
INTRAMUSCULAR | Status: AC
  Filled 2020-08-22: qty 1

## 2020-08-22 MED ORDER — PROPOFOL 10 MG/ML IV BOLUS
INTRAVENOUS | Status: DC | PRN
  Administered 2020-08-22: 130 mg via INTRAVENOUS

## 2020-08-22 MED ORDER — CELECOXIB 200 MG PO CAPS: 200.0000 mg | ORAL_CAPSULE | Freq: Once | ORAL | Status: AC

## 2020-08-22 MED ORDER — ACETAMINOPHEN 500 MG PO TABS: 1000.0000 mg | ORAL_TABLET | Freq: Once | ORAL | Status: AC

## 2020-08-22 MED ORDER — MIDAZOLAM HCL 5 MG/5ML IJ SOLN
INTRAMUSCULAR | Status: DC | PRN
  Administered 2020-08-22: 2 mg via INTRAVENOUS

## 2020-08-22 MED ORDER — CHLORHEXIDINE GLUCONATE 0.12 % MT SOLN
OROMUCOSAL | Status: AC
  Administered 2020-08-22: 15 mL via OROMUCOSAL
  Filled 2020-08-22: qty 15

## 2020-08-22 MED ORDER — SUGAMMADEX SODIUM 500 MG/5ML IV SOLN
INTRAVENOUS | Status: DC | PRN
  Administered 2020-08-22: 300 mg via INTRAVENOUS

## 2020-08-22 MED ORDER — ROCURONIUM BROMIDE 10 MG/ML (PF) SYRINGE
PREFILLED_SYRINGE | INTRAVENOUS | Status: DC | PRN
  Administered 2020-08-22: 50 mg via INTRAVENOUS
  Administered 2020-08-22 (×2): 10 mg via INTRAVENOUS

## 2020-08-22 MED ORDER — DIPHENHYDRAMINE HCL 50 MG/ML IJ SOLN
INTRAMUSCULAR | Status: DC | PRN
  Administered 2020-08-22: 12.5 mg via INTRAVENOUS

## 2020-08-22 MED ORDER — CELECOXIB 200 MG PO CAPS
ORAL_CAPSULE | ORAL | Status: AC
  Administered 2020-08-22: 200 mg via ORAL
  Filled 2020-08-22: qty 1

## 2020-08-22 MED ORDER — BUPIVACAINE HCL (PF) 0.25 % IJ SOLN
INTRAMUSCULAR | Status: AC
  Filled 2020-08-22: qty 30

## 2020-08-22 MED ORDER — HYDROCODONE-ACETAMINOPHEN 5-325 MG PO TABS: 1.0000 | ORAL_TABLET | Freq: Four times a day (QID) | ORAL | 0 refills | Status: DC | PRN

## 2020-08-22 MED ORDER — HYDROMORPHONE HCL 1 MG/ML IJ SOLN: 0.2500 mg | INTRAMUSCULAR | Status: DC | PRN

## 2020-08-22 MED ORDER — DEXAMETHASONE SODIUM PHOSPHATE 10 MG/ML IJ SOLN
INTRAMUSCULAR | Status: AC
  Filled 2020-08-22: qty 1

## 2020-08-22 MED ORDER — CHLORHEXIDINE GLUCONATE 0.12 % MT SOLN: 15.0000 mL | Freq: Once | OROMUCOSAL | Status: AC

## 2020-08-22 MED ORDER — SUCCINYLCHOLINE CHLORIDE 200 MG/10ML IV SOSY
PREFILLED_SYRINGE | INTRAVENOUS | Status: AC
  Filled 2020-08-22: qty 10

## 2020-08-22 MED ORDER — ACETAMINOPHEN 500 MG PO TABS
ORAL_TABLET | ORAL | Status: AC
  Administered 2020-08-22: 1000 mg via ORAL
  Filled 2020-08-22: qty 2

## 2020-08-22 MED ORDER — DEXAMETHASONE SODIUM PHOSPHATE 10 MG/ML IJ SOLN
INTRAMUSCULAR | Status: DC | PRN
  Administered 2020-08-22: 10 mg via INTRAVENOUS

## 2020-08-22 MED ORDER — FENTANYL CITRATE (PF) 250 MCG/5ML IJ SOLN
INTRAMUSCULAR | Status: AC
  Filled 2020-08-22: qty 5

## 2020-08-22 MED ORDER — LACTATED RINGERS IV SOLN: INTRAVENOUS | Status: DC

## 2020-08-22 MED ORDER — ONDANSETRON HCL 4 MG/2ML IJ SOLN
INTRAMUSCULAR | Status: AC
  Filled 2020-08-22: qty 2

## 2020-08-22 MED ORDER — PHENYLEPHRINE 40 MCG/ML (10ML) SYRINGE FOR IV PUSH (FOR BLOOD PRESSURE SUPPORT)
PREFILLED_SYRINGE | INTRAVENOUS | Status: DC | PRN
  Administered 2020-08-22 (×2): 80 ug via INTRAVENOUS
  Administered 2020-08-22: 120 ug via INTRAVENOUS

## 2020-08-22 MED ORDER — ROCURONIUM BROMIDE 10 MG/ML (PF) SYRINGE
PREFILLED_SYRINGE | INTRAVENOUS | Status: AC
  Filled 2020-08-22: qty 10

## 2020-08-22 MED ORDER — BUPIVACAINE HCL (PF) 0.25 % IJ SOLN
INTRAMUSCULAR | Status: DC | PRN
  Administered 2020-08-22: 10 mL

## 2020-08-22 MED ORDER — ONDANSETRON HCL 4 MG/2ML IJ SOLN
INTRAMUSCULAR | Status: DC | PRN
  Administered 2020-08-22: 4 mg via INTRAVENOUS

## 2020-08-22 MED ORDER — FENTANYL CITRATE (PF) 100 MCG/2ML IJ SOLN
INTRAMUSCULAR | Status: DC | PRN
  Administered 2020-08-22: 50 ug via INTRAVENOUS
  Administered 2020-08-22: 25 ug via INTRAVENOUS
  Administered 2020-08-22: 75 ug via INTRAVENOUS

## 2020-08-22 MED ORDER — POVIDONE-IODINE 10 % EX SWAB: 2.0000 "application " | Freq: Once | CUTANEOUS | Status: DC

## 2020-08-22 MED ORDER — LABETALOL HCL 200 MG PO TABS
400.0000 mg | ORAL_TABLET | ORAL | Status: AC
  Administered 2020-08-22: 400 mg via ORAL
  Filled 2020-08-22: qty 2

## 2020-08-22 MED ORDER — MIDAZOLAM HCL 2 MG/2ML IJ SOLN
INTRAMUSCULAR | Status: AC
  Filled 2020-08-22: qty 2

## 2020-08-22 MED ORDER — PROPOFOL 10 MG/ML IV BOLUS
INTRAVENOUS | Status: AC
  Filled 2020-08-22: qty 40

## 2020-08-22 MED ORDER — SUCCINYLCHOLINE CHLORIDE 200 MG/10ML IV SOSY
PREFILLED_SYRINGE | INTRAVENOUS | Status: DC | PRN
  Administered 2020-08-22: 140 mg via INTRAVENOUS

## 2020-08-22 SURGICAL SUPPLY — 40 items
ADH SKN CLS APL DERMABOND .7 (GAUZE/BANDAGES/DRESSINGS) ×1
BAG SPEC RTRVL LRG 6X4 10 (ENDOMECHANICALS)
CABLE HIGH FREQUENCY MONO STRZ (ELECTRODE) IMPLANT
CATH ROBINSON RED A/P 16FR (CATHETERS) IMPLANT
DECANTER SPIKE VIAL GLASS SM (MISCELLANEOUS) ×2 IMPLANT
DERMABOND ADVANCED (GAUZE/BANDAGES/DRESSINGS) ×1
DERMABOND ADVANCED .7 DNX12 (GAUZE/BANDAGES/DRESSINGS) ×1 IMPLANT
DRSG OPSITE POSTOP 3X4 (GAUZE/BANDAGES/DRESSINGS) IMPLANT
DURAPREP 26ML APPLICATOR (WOUND CARE) ×2 IMPLANT
GLOVE BIO SURGEON STRL SZ8 (GLOVE) ×2 IMPLANT
GLOVE BIOGEL PI IND STRL 7.0 (GLOVE) ×2 IMPLANT
GLOVE BIOGEL PI INDICATOR 7.0 (GLOVE) ×2
GLOVE ORTHO TXT STRL SZ7.5 (GLOVE) ×2 IMPLANT
GOWN STRL REUS W/ TWL LRG LVL3 (GOWN DISPOSABLE) ×2 IMPLANT
GOWN STRL REUS W/ TWL XL LVL3 (GOWN DISPOSABLE) ×1 IMPLANT
GOWN STRL REUS W/TWL LRG LVL3 (GOWN DISPOSABLE) ×4
GOWN STRL REUS W/TWL XL LVL3 (GOWN DISPOSABLE) ×2
KIT TURNOVER KIT B (KITS) ×2 IMPLANT
NDL EPID 17G 5 ECHO TUOHY (NEEDLE) IMPLANT
NDL INSUFFLATION 14GA 120MM (NEEDLE) ×1 IMPLANT
NEEDLE EPID 17G 5 ECHO TUOHY (NEEDLE) IMPLANT
NEEDLE INSUFFLATION 14GA 120MM (NEEDLE) ×2 IMPLANT
NS IRRIG 1000ML POUR BTL (IV SOLUTION) ×2 IMPLANT
PACK LAPAROSCOPY BASIN (CUSTOM PROCEDURE TRAY) ×2 IMPLANT
PACK TRENDGUARD 450 HYBRID PRO (MISCELLANEOUS) IMPLANT
POUCH SPECIMEN RETRIEVAL 10MM (ENDOMECHANICALS) IMPLANT
PROTECTOR NERVE ULNAR (MISCELLANEOUS) ×2 IMPLANT
SET IRRIG TUBING LAPAROSCOPIC (IRRIGATION / IRRIGATOR) IMPLANT
SET TUBE SMOKE EVAC HIGH FLOW (TUBING) ×2 IMPLANT
SHEARS HARMONIC ACE PLUS 36CM (ENDOMECHANICALS) IMPLANT
SLEEVE ENDOPATH XCEL 5M (ENDOMECHANICALS) ×2 IMPLANT
SOLUTION ELECTROLUBE (MISCELLANEOUS) IMPLANT
SUT VICRYL 0 UR6 27IN ABS (SUTURE) IMPLANT
SUT VICRYL 4-0 PS2 18IN ABS (SUTURE) ×2 IMPLANT
TOWEL GREEN STERILE FF (TOWEL DISPOSABLE) ×4 IMPLANT
TRAY FOLEY W/BAG SLVR 14FR (SET/KITS/TRAYS/PACK) IMPLANT
TRENDGUARD 450 HYBRID PRO PACK (MISCELLANEOUS) ×2
TROCAR XCEL NON-BLD 11X100MML (ENDOMECHANICALS) IMPLANT
TROCAR XCEL NON-BLD 5MMX100MML (ENDOMECHANICALS) ×2 IMPLANT
WARMER LAPAROSCOPE (MISCELLANEOUS) ×2 IMPLANT

## 2020-08-22 NOTE — Transfer of Care (Signed)
Immediate Anesthesia Transfer of Care Note  Patient: [name]  Procedure(s) Performed: LAPAROSCOPIC BILATERAL FULGERATION (Bilateral Abdomen)  Patient Location: PACU  Anesthesia Type:General  Level of Consciousness: drowsy  Airway & Oxygen Therapy: Patient Spontanous Breathing and Patient connected to face mask oxygen  Post-op Assessment: Report given to RN and Post -op Vital signs reviewed and stable  Post vital signs: Reviewed and stable  Last Vitals:  Vitals Value Taken Time  BP 129/78 08/22/20 0853  Temp    Pulse 75 08/22/20 0855  Resp 19 08/22/20 0855  SpO2 94 % 08/22/20 0855  Vitals shown include unvalidated device data.  Last Pain:  Vitals:   08/22/20 0658  TempSrc:   PainSc: 0-No pain      Patients Stated Pain Goal: 5 (74/93/55 2174)  Complications: No complications documented.

## 2020-08-22 NOTE — Interval H&P Note (Signed)
History and Physical Interval Note:  08/22/2020 7:19 AM  [name]  has presented today for surgery, with the diagnosis of sterilization.  The various methods of treatment have been discussed with the patient and family. After consideration of risks, benefits and other options for treatment, the patient has consented to  Procedure(s): LAPAROSCOPIC BILATERAL SALPINGECTOMY (Bilateral) as a surgical intervention.  The patient's history has been reviewed, patient examined, no change in status, stable for surgery.  I have reviewed the patient's chart and labs.  Questions were answered to the patient's satisfaction.     Blane Ohara Kila Godina

## 2020-08-22 NOTE — Discharge Instructions (Addendum)
Routine instructions for laparoscopic BTL

## 2020-08-22 NOTE — Anesthesia Postprocedure Evaluation (Signed)
Anesthesia Post Note  Patient: [name]  Procedure(s) Performed: LAPAROSCOPIC BILATERAL FULGERATION (Bilateral Abdomen)     Patient location during evaluation: PACU Anesthesia Type: General Level of consciousness: awake and alert Pain management: pain level controlled Vital Signs Assessment: post-procedure vital signs reviewed and stable Respiratory status: spontaneous breathing, nonlabored ventilation and respiratory function stable Cardiovascular status: blood pressure returned to baseline and stable Postop Assessment: no apparent nausea or vomiting Anesthetic complications: no   No complications documented.  Last Vitals:  Vitals:   08/22/20 0925 08/22/20 0935  BP: 126/75 112/64  Pulse: 77 82  Resp: 20 19  Temp:  36.6 C  SpO2: 96% 96%    Last Pain:  Vitals:   08/22/20 0935  TempSrc:   PainSc: 0-No pain                 Xavia Kniskern,W. EDMOND

## 2020-08-22 NOTE — Anesthesia Procedure Notes (Signed)
Procedure Name: Intubation Date/Time: 08/22/2020 7:40 AM Performed by: Candis Shine, CRNA Pre-anesthesia Checklist: Patient identified, Emergency Drugs available, Suction available and Patient being monitored Patient Re-evaluated:Patient Re-evaluated prior to induction Oxygen Delivery Method: Circle System Utilized Preoxygenation: Pre-oxygenation with 100% oxygen Induction Type: IV induction and Rapid sequence Laryngoscope Size: Mac and 3 Grade View: Grade II Tube type: Oral Tube size: 7.5 mm Number of attempts: 1 Airway Equipment and Method: Stylet Placement Confirmation: ETT inserted through vocal cords under direct vision,  positive ETCO2 and breath sounds checked- equal and bilateral Secured at: 22 cm Tube secured with: Tape Dental Injury: Teeth and Oropharynx as per pre-operative assessment

## 2020-08-22 NOTE — Op Note (Signed)
Preoperative diagnosis: Desires surgical sterility Postoperative diagnosis: Same Procedure: Laparoscopic bilateral tubal fulguration Surgeon: Cheri Fowler M.D. Anesthesia: Gen. Endotracheal tube Findings: She had a normal pelvis with some small uterine fibroids, normal tubes and ovaries Estimated blood loss: Minimal Complications: None  Procedure in detail: The patient was taken to the operating room and placed in the dorsosupine position. General anesthesia was induced. Her left arm was tucked to her side and legs were placed in mobile stirrups. Abdomen was then prepped and draped in the usual sterile fashion, bladder drained with a red Robinson catheter, Hulka tenaculum applied to the cervix for uterine manipulation. Infraumbilical skin was then infiltrated with quarter percent Marcaine and a 1 cm vertical incision was made. The Veress needle was inserted into the peritoneal cavity and placement confirmed by the water drop test an opening pressure of 3 mm of mercury. CO2 was insufflated to a pressure of 14 mm of mercury and the Veress needle was removed. A 10/11 disposable trocar was then introduced with direct visualization with the laparoscope. The operating scope was then inserted. Good visualization was achieved, inspection revealed the above-mentioned findings. Both fallopian tubes were easily identified and traced to their fimbriated ends. A 3 cm portion of the middle of each tube was fulgurated with bipolar cautery until the amp meter read 0 in all spots along a 3 cm segment. This was done on both sides with good fulguration of both tubes and good hemostasis. No complications were encountered. The laparoscope was removed. All gas was allowed to deflate from the abdomen and the trocar was removed. Fascia was approximated with one suture of 0 Vicryl. Skin incision was closed with interrupted subcuticular sutures of 4-0 Vicryl followed by Dermabond. The Hulka tenaculum was removed. The patient was  awakened in the operating room and taken to the recovery in stable condition after tolerating the procedure well. Counts were correct and she had PAS hose on throughout the procedure.

## 2020-08-23 ENCOUNTER — Encounter (HOSPITAL_COMMUNITY): Payer: Self-pay | Admitting: Obstetrics and Gynecology

## 2020-08-30 DIAGNOSIS — Z0001 Encounter for general adult medical examination with abnormal findings: Secondary | ICD-10-CM | POA: Diagnosis not present

## 2020-08-30 DIAGNOSIS — J45909 Unspecified asthma, uncomplicated: Secondary | ICD-10-CM | POA: Diagnosis not present

## 2020-08-30 DIAGNOSIS — E782 Mixed hyperlipidemia: Secondary | ICD-10-CM | POA: Diagnosis not present

## 2020-08-30 DIAGNOSIS — F3341 Major depressive disorder, recurrent, in partial remission: Secondary | ICD-10-CM | POA: Diagnosis not present

## 2020-08-30 DIAGNOSIS — R7303 Prediabetes: Secondary | ICD-10-CM | POA: Diagnosis not present

## 2020-08-30 DIAGNOSIS — J302 Other seasonal allergic rhinitis: Secondary | ICD-10-CM | POA: Diagnosis not present

## 2020-08-30 DIAGNOSIS — Z720 Tobacco use: Secondary | ICD-10-CM | POA: Diagnosis not present

## 2020-08-30 DIAGNOSIS — R1013 Epigastric pain: Secondary | ICD-10-CM | POA: Diagnosis not present

## 2020-08-30 DIAGNOSIS — I10 Essential (primary) hypertension: Secondary | ICD-10-CM | POA: Diagnosis not present

## 2020-09-07 ENCOUNTER — Encounter: Payer: Self-pay | Admitting: Cardiology

## 2020-09-22 ENCOUNTER — Ambulatory Visit: Payer: Medicare HMO | Admitting: Cardiology

## 2020-09-22 DIAGNOSIS — Z720 Tobacco use: Secondary | ICD-10-CM | POA: Diagnosis not present

## 2020-09-22 DIAGNOSIS — R300 Dysuria: Secondary | ICD-10-CM | POA: Diagnosis not present

## 2020-09-22 DIAGNOSIS — R1013 Epigastric pain: Secondary | ICD-10-CM | POA: Diagnosis not present

## 2020-09-22 DIAGNOSIS — I10 Essential (primary) hypertension: Secondary | ICD-10-CM | POA: Diagnosis not present

## 2020-09-22 DIAGNOSIS — J302 Other seasonal allergic rhinitis: Secondary | ICD-10-CM | POA: Diagnosis not present

## 2020-09-22 DIAGNOSIS — R7303 Prediabetes: Secondary | ICD-10-CM | POA: Diagnosis not present

## 2020-09-22 DIAGNOSIS — E782 Mixed hyperlipidemia: Secondary | ICD-10-CM | POA: Diagnosis not present

## 2020-09-22 DIAGNOSIS — N3000 Acute cystitis without hematuria: Secondary | ICD-10-CM | POA: Diagnosis not present

## 2020-09-22 DIAGNOSIS — J4530 Mild persistent asthma, uncomplicated: Secondary | ICD-10-CM | POA: Diagnosis not present

## 2020-09-30 DIAGNOSIS — I10 Essential (primary) hypertension: Secondary | ICD-10-CM | POA: Diagnosis not present

## 2020-09-30 DIAGNOSIS — J302 Other seasonal allergic rhinitis: Secondary | ICD-10-CM | POA: Diagnosis not present

## 2020-09-30 DIAGNOSIS — J45909 Unspecified asthma, uncomplicated: Secondary | ICD-10-CM | POA: Diagnosis not present

## 2020-09-30 DIAGNOSIS — J0100 Acute maxillary sinusitis, unspecified: Secondary | ICD-10-CM | POA: Diagnosis not present

## 2020-09-30 DIAGNOSIS — R1013 Epigastric pain: Secondary | ICD-10-CM | POA: Diagnosis not present

## 2020-09-30 DIAGNOSIS — F3341 Major depressive disorder, recurrent, in partial remission: Secondary | ICD-10-CM | POA: Diagnosis not present

## 2020-09-30 DIAGNOSIS — Z720 Tobacco use: Secondary | ICD-10-CM | POA: Diagnosis not present

## 2020-09-30 DIAGNOSIS — R7303 Prediabetes: Secondary | ICD-10-CM | POA: Diagnosis not present

## 2020-09-30 DIAGNOSIS — E782 Mixed hyperlipidemia: Secondary | ICD-10-CM | POA: Diagnosis not present

## 2020-10-12 DIAGNOSIS — G4733 Obstructive sleep apnea (adult) (pediatric): Secondary | ICD-10-CM | POA: Diagnosis not present

## 2020-10-13 ENCOUNTER — Emergency Department (HOSPITAL_COMMUNITY)
Admission: EM | Admit: 2020-10-13 | Discharge: 2020-10-13 | Disposition: A | Discharge status: Home / Self Care | Payer: Medicare HMO | Attending: Emergency Medicine | Admitting: Emergency Medicine

## 2020-10-13 ENCOUNTER — Encounter (HOSPITAL_COMMUNITY): Payer: Self-pay | Admitting: Emergency Medicine

## 2020-10-13 DIAGNOSIS — M791 Myalgia, unspecified site: Secondary | ICD-10-CM | POA: Diagnosis not present

## 2020-10-13 DIAGNOSIS — R059 Cough, unspecified: Secondary | ICD-10-CM | POA: Insufficient documentation

## 2020-10-13 DIAGNOSIS — Z1159 Encounter for screening for other viral diseases: Secondary | ICD-10-CM | POA: Diagnosis not present

## 2020-10-13 DIAGNOSIS — R197 Diarrhea, unspecified: Secondary | ICD-10-CM | POA: Diagnosis not present

## 2020-10-13 DIAGNOSIS — Z20822 Contact with and (suspected) exposure to covid-19: Secondary | ICD-10-CM | POA: Insufficient documentation

## 2020-10-13 DIAGNOSIS — Z5321 Procedure and treatment not carried out due to patient leaving prior to being seen by health care provider: Secondary | ICD-10-CM | POA: Insufficient documentation

## 2020-10-13 LAB — RESP PANEL BY RT-PCR (FLU A&B, COVID) ARPGX2
Influenza A by PCR: NEGATIVE
Influenza B by PCR: NEGATIVE
SARS Coronavirus 2 by RT PCR: NEGATIVE

## 2020-10-13 NOTE — ED Notes (Signed)
No answer when called for room 

## 2020-10-13 NOTE — ED Notes (Signed)
Pt didn't answer when called to recheck vitals  

## 2020-10-13 NOTE — ED Triage Notes (Signed)
Pt reports cough, body aches and diarrhea since 1/3. resp e/u, nad.

## 2020-10-25 ENCOUNTER — Emergency Department (HOSPITAL_COMMUNITY)
Admission: EM | Admit: 2020-10-25 | Discharge: 2020-10-25 | Disposition: A | Discharge status: Home / Self Care | Payer: Medicare HMO | Attending: Emergency Medicine | Admitting: Emergency Medicine

## 2020-10-25 ENCOUNTER — Other Ambulatory Visit: Payer: Self-pay

## 2020-10-25 ENCOUNTER — Ambulatory Visit: Payer: Medicare HMO

## 2020-10-25 ENCOUNTER — Emergency Department (HOSPITAL_COMMUNITY): Payer: Medicare HMO

## 2020-10-25 DIAGNOSIS — J4520 Mild intermittent asthma, uncomplicated: Secondary | ICD-10-CM | POA: Diagnosis not present

## 2020-10-25 DIAGNOSIS — J029 Acute pharyngitis, unspecified: Secondary | ICD-10-CM | POA: Diagnosis not present

## 2020-10-25 DIAGNOSIS — U071 COVID-19: Secondary | ICD-10-CM | POA: Diagnosis not present

## 2020-10-25 DIAGNOSIS — Z7951 Long term (current) use of inhaled steroids: Secondary | ICD-10-CM | POA: Diagnosis not present

## 2020-10-25 DIAGNOSIS — R059 Cough, unspecified: Secondary | ICD-10-CM | POA: Diagnosis not present

## 2020-10-25 DIAGNOSIS — M791 Myalgia, unspecified site: Secondary | ICD-10-CM | POA: Diagnosis present

## 2020-10-25 DIAGNOSIS — I10 Essential (primary) hypertension: Secondary | ICD-10-CM | POA: Insufficient documentation

## 2020-10-25 DIAGNOSIS — Z79899 Other long term (current) drug therapy: Secondary | ICD-10-CM | POA: Diagnosis not present

## 2020-10-25 DIAGNOSIS — R509 Fever, unspecified: Secondary | ICD-10-CM | POA: Diagnosis not present

## 2020-10-25 DIAGNOSIS — Z87891 Personal history of nicotine dependence: Secondary | ICD-10-CM | POA: Insufficient documentation

## 2020-10-25 DIAGNOSIS — B349 Viral infection, unspecified: Secondary | ICD-10-CM

## 2020-10-25 LAB — COMPREHENSIVE METABOLIC PANEL
ALT: 45 U/L — ABNORMAL HIGH (ref 0–44)
AST: 32 U/L (ref 15–41)
Albumin: 3.7 g/dL (ref 3.5–5.0)
Alkaline Phosphatase: 54 U/L (ref 38–126)
Anion gap: 8 (ref 5–15)
BUN: 6 mg/dL (ref 6–20)
CO2: 25 mmol/L (ref 22–32)
Calcium: 9 mg/dL (ref 8.9–10.3)
Chloride: 102 mmol/L (ref 98–111)
Creatinine, Ser: 0.8 mg/dL (ref 0.44–1.00)
GFR, Estimated: >60 mL/min (ref >60)
Glucose, Bld: 97 mg/dL (ref 70–99)
Potassium: 3.8 mmol/L (ref 3.5–5.1)
Sodium: 135 mmol/L (ref 135–145)
Total Bilirubin: 0.4 mg/dL (ref 0.3–1.2)
Total Protein: 7.3 g/dL (ref 6.5–8.1)

## 2020-10-25 LAB — CBC
HCT: 39.1 % (ref 36.0–46.0)
Hemoglobin: 12.3 g/dL (ref 12.0–15.0)
MCH: 28.8 pg (ref 26.0–34.0)
MCHC: 31.5 g/dL (ref 30.0–36.0)
MCV: 91.6 fL (ref 80.0–100.0)
Platelets: 230 10*3/uL (ref 150–400)
RBC: 4.27 MIL/uL (ref 3.87–5.11)
RDW: 13.9 % (ref 11.5–15.5)
WBC: 3.1 10*3/uL — ABNORMAL LOW (ref 4.0–10.5)
nRBC: 0 % (ref 0.0–0.2)

## 2020-10-25 NOTE — ED Triage Notes (Signed)
Pt back to ED today not feeling any better , had a negative covid test on 1/6 ,
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
## 2020-10-25 NOTE — ED Triage Notes (Signed)
Throat is not red , daughter did have a covid test today that is pending

## 2020-10-25 NOTE — Discharge Instructions (Signed)
Plenty of rest and drink a lot of fluids.  See your doctor for problems.  Isolate yourself from other people and always wear a mask until your test results return.  If you are COVID-positive you will need to isolate for an additional 5 to 7 days.

## 2020-10-26 DIAGNOSIS — J302 Other seasonal allergic rhinitis: Secondary | ICD-10-CM | POA: Diagnosis not present

## 2020-10-26 DIAGNOSIS — Z720 Tobacco use: Secondary | ICD-10-CM | POA: Diagnosis not present

## 2020-10-26 DIAGNOSIS — R7303 Prediabetes: Secondary | ICD-10-CM | POA: Diagnosis not present

## 2020-10-26 DIAGNOSIS — I10 Essential (primary) hypertension: Secondary | ICD-10-CM | POA: Diagnosis not present

## 2020-10-26 DIAGNOSIS — E782 Mixed hyperlipidemia: Secondary | ICD-10-CM | POA: Diagnosis not present

## 2020-10-26 DIAGNOSIS — F3341 Major depressive disorder, recurrent, in partial remission: Secondary | ICD-10-CM | POA: Diagnosis not present

## 2020-10-26 DIAGNOSIS — U071 COVID-19: Secondary | ICD-10-CM | POA: Diagnosis not present

## 2020-10-26 DIAGNOSIS — R1013 Epigastric pain: Secondary | ICD-10-CM | POA: Diagnosis not present

## 2020-10-26 DIAGNOSIS — J45909 Unspecified asthma, uncomplicated: Secondary | ICD-10-CM | POA: Diagnosis not present

## 2020-10-26 LAB — SARS CORONAVIRUS 2 (TAT 6-24 HRS): SARS Coronavirus 2: POSITIVE — AB

## 2020-10-27 ENCOUNTER — Telehealth: Payer: Self-pay

## 2020-10-27 NOTE — Telephone Encounter (Signed)
Called to discuss with patient about COVID-19 symptoms and the use of one of the available treatments for those with mild to moderate Covid symptoms and at a high risk of hospitalization.  Pt appears to qualify for outpatient treatment due to co-morbid conditions and/or a member of an at-risk group in accordance with the FDA Emergency Use Authorization.    Symptom onset: 10/18/20 Vaccinated: Yes Booster? No Immunocompromised? No Qualifiers: Obesity  Unable to reach pt - Reached pt. Pt. Is out of 7 day window for treatment.   [name]

## 2020-11-02 ENCOUNTER — Other Ambulatory Visit: Payer: Self-pay

## 2020-11-02 ENCOUNTER — Encounter: Payer: Self-pay | Admitting: Cardiology

## 2020-11-02 ENCOUNTER — Ambulatory Visit (INDEPENDENT_AMBULATORY_CARE_PROVIDER_SITE_OTHER): Payer: Medicare HMO | Admitting: Cardiology

## 2020-11-02 VITALS — BP 148/80 | HR 88 | Ht 65.0 in | Wt 291.2 lb

## 2020-11-02 DIAGNOSIS — U099 Post covid-19 condition, unspecified: Secondary | ICD-10-CM | POA: Diagnosis not present

## 2020-11-02 DIAGNOSIS — O1495 Unspecified pre-eclampsia, complicating the puerperium: Secondary | ICD-10-CM | POA: Diagnosis not present

## 2020-11-02 DIAGNOSIS — I110 Hypertensive heart disease with heart failure: Secondary | ICD-10-CM

## 2020-11-02 NOTE — Patient Instructions (Signed)
Medication Instructions:   Your physician recommends that you continue on your current medications as directed. Please refer to the Current Medication list given to you today.  *If you need a refill on your cardiac medications before your next appointment, please call your pharmacy*   Testing/Procedures:  Your physician has requested that you have an echocardiogram. Echocardiography is a painless test that uses sound waves to create images of your heart. It provides your doctor with information about the size and shape of your heart and how well your heart's chambers and valves are working. This procedure takes approximately one hour. There are no restrictions for this procedure.   Follow-Up: At [facility], you and your health needs are our priority.  As part of our continuing mission to provide you with exceptional heart care, we have created designated Provider Care Teams.  These Care Teams include your primary Cardiologist (physician) and Advanced Practice Providers (APPs -  Physician Assistants and Nurse Practitioners) who all work together to provide you with the care you need, when you need it.  We recommend signing up for the patient portal called "MyChart".  Sign up information is provided on this After Visit Summary.  MyChart is used to connect with patients for Virtual Visits (Telemedicine).  Patients are able to view lab/test results, encounter notes, upcoming appointments, etc.  Non-urgent messages can be sent to your provider as well.   To learn more about what you can do with MyChart, go to [email or web address].    Your next appointment:   6 month(s)  The format for your next appointment:   In Person  Provider:   Katarina Nelson, MD    
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
## 2020-11-19 DIAGNOSIS — H5203 Hypermetropia, bilateral: Secondary | ICD-10-CM | POA: Diagnosis not present

## 2020-11-19 DIAGNOSIS — H43393 Other vitreous opacities, bilateral: Secondary | ICD-10-CM | POA: Diagnosis not present

## 2020-11-19 DIAGNOSIS — H40033 Anatomical narrow angle, bilateral: Secondary | ICD-10-CM | POA: Diagnosis not present

## 2020-11-25 ENCOUNTER — Ambulatory Visit (HOSPITAL_COMMUNITY): Payer: Medicare HMO | Attending: Internal Medicine

## 2020-11-25 ENCOUNTER — Other Ambulatory Visit: Payer: Self-pay

## 2020-11-25 DIAGNOSIS — U099 Post covid-19 condition, unspecified: Secondary | ICD-10-CM | POA: Diagnosis not present

## 2020-11-25 DIAGNOSIS — I110 Hypertensive heart disease with heart failure: Secondary | ICD-10-CM | POA: Insufficient documentation

## 2020-11-25 LAB — ECHOCARDIOGRAM COMPLETE
Area-P 1/2: 3.95 cm2
S' Lateral: 3.6 cm

## 2020-11-28 ENCOUNTER — Telehealth: Payer: Self-pay | Admitting: Cardiology

## 2020-11-28 NOTE — Telephone Encounter (Signed)
Reviewed results with pt who verbalized understanding and thanked me for the call.

## 2020-11-28 NOTE — Telephone Encounter (Signed)
Follow Up:      Pt is returning Natasha's call from today, concerning her Echo results.

## 2020-11-29 DIAGNOSIS — I10 Essential (primary) hypertension: Secondary | ICD-10-CM | POA: Diagnosis not present

## 2020-11-29 DIAGNOSIS — Z720 Tobacco use: Secondary | ICD-10-CM | POA: Diagnosis not present

## 2020-11-29 DIAGNOSIS — J302 Other seasonal allergic rhinitis: Secondary | ICD-10-CM | POA: Diagnosis not present

## 2020-11-29 DIAGNOSIS — F3341 Major depressive disorder, recurrent, in partial remission: Secondary | ICD-10-CM | POA: Diagnosis not present

## 2020-11-29 DIAGNOSIS — R300 Dysuria: Secondary | ICD-10-CM | POA: Diagnosis not present

## 2020-11-29 DIAGNOSIS — R1013 Epigastric pain: Secondary | ICD-10-CM | POA: Diagnosis not present

## 2020-11-29 DIAGNOSIS — R7303 Prediabetes: Secondary | ICD-10-CM | POA: Diagnosis not present

## 2020-11-29 DIAGNOSIS — J45909 Unspecified asthma, uncomplicated: Secondary | ICD-10-CM | POA: Diagnosis not present

## 2020-11-29 DIAGNOSIS — E782 Mixed hyperlipidemia: Secondary | ICD-10-CM | POA: Diagnosis not present

## 2020-12-07 DIAGNOSIS — Z9989 Dependence on other enabling machines and devices: Secondary | ICD-10-CM | POA: Diagnosis not present

## 2020-12-07 DIAGNOSIS — E559 Vitamin D deficiency, unspecified: Secondary | ICD-10-CM | POA: Diagnosis not present

## 2020-12-07 DIAGNOSIS — F32A Depression, unspecified: Secondary | ICD-10-CM | POA: Diagnosis not present

## 2020-12-07 DIAGNOSIS — G4733 Obstructive sleep apnea (adult) (pediatric): Secondary | ICD-10-CM | POA: Diagnosis not present

## 2020-12-07 DIAGNOSIS — O903 Peripartum cardiomyopathy: Secondary | ICD-10-CM | POA: Diagnosis not present

## 2020-12-07 DIAGNOSIS — J4520 Mild intermittent asthma, uncomplicated: Secondary | ICD-10-CM | POA: Diagnosis not present

## 2020-12-07 DIAGNOSIS — I10 Essential (primary) hypertension: Secondary | ICD-10-CM | POA: Diagnosis not present

## 2020-12-07 DIAGNOSIS — R7303 Prediabetes: Secondary | ICD-10-CM | POA: Diagnosis not present

## 2020-12-27 DIAGNOSIS — J45909 Unspecified asthma, uncomplicated: Secondary | ICD-10-CM | POA: Diagnosis not present

## 2020-12-27 DIAGNOSIS — R1013 Epigastric pain: Secondary | ICD-10-CM | POA: Diagnosis not present

## 2020-12-27 DIAGNOSIS — R7303 Prediabetes: Secondary | ICD-10-CM | POA: Diagnosis not present

## 2020-12-27 DIAGNOSIS — I10 Essential (primary) hypertension: Secondary | ICD-10-CM | POA: Diagnosis not present

## 2020-12-27 DIAGNOSIS — E782 Mixed hyperlipidemia: Secondary | ICD-10-CM | POA: Diagnosis not present

## 2020-12-27 DIAGNOSIS — Z720 Tobacco use: Secondary | ICD-10-CM | POA: Diagnosis not present

## 2020-12-27 DIAGNOSIS — F3341 Major depressive disorder, recurrent, in partial remission: Secondary | ICD-10-CM | POA: Diagnosis not present

## 2020-12-27 DIAGNOSIS — J302 Other seasonal allergic rhinitis: Secondary | ICD-10-CM | POA: Diagnosis not present

## 2021-01-10 DIAGNOSIS — G4733 Obstructive sleep apnea (adult) (pediatric): Secondary | ICD-10-CM | POA: Diagnosis not present

## 2021-01-17 ENCOUNTER — Emergency Department (HOSPITAL_COMMUNITY)
Admission: EM | Admit: 2021-01-17 | Discharge: 2021-01-18 | Disposition: A | Discharge status: Home / Self Care | Payer: Medicare HMO | Attending: Emergency Medicine | Admitting: Emergency Medicine

## 2021-01-17 DIAGNOSIS — Z5321 Procedure and treatment not carried out due to patient leaving prior to being seen by health care provider: Secondary | ICD-10-CM | POA: Diagnosis not present

## 2021-01-17 DIAGNOSIS — M545 Low back pain, unspecified: Secondary | ICD-10-CM | POA: Diagnosis not present

## 2021-01-17 DIAGNOSIS — M549 Dorsalgia, unspecified: Secondary | ICD-10-CM | POA: Diagnosis not present

## 2021-01-17 DIAGNOSIS — R0789 Other chest pain: Secondary | ICD-10-CM | POA: Diagnosis not present

## 2021-01-17 DIAGNOSIS — R079 Chest pain, unspecified: Secondary | ICD-10-CM | POA: Diagnosis not present

## 2021-01-17 NOTE — ED Notes (Signed)
Pt is in peds with child, not available to be triaged at this time.

## 2021-01-18 NOTE — ED Notes (Signed)
Patient remained in Peds with her children, declined to be triaged.

## 2021-01-19 DIAGNOSIS — I10 Essential (primary) hypertension: Secondary | ICD-10-CM | POA: Diagnosis not present

## 2021-01-19 DIAGNOSIS — J4530 Mild persistent asthma, uncomplicated: Secondary | ICD-10-CM | POA: Diagnosis not present

## 2021-01-19 DIAGNOSIS — M549 Dorsalgia, unspecified: Secondary | ICD-10-CM | POA: Diagnosis not present

## 2021-01-19 DIAGNOSIS — Z720 Tobacco use: Secondary | ICD-10-CM | POA: Diagnosis not present

## 2021-01-19 DIAGNOSIS — E782 Mixed hyperlipidemia: Secondary | ICD-10-CM | POA: Diagnosis not present

## 2021-01-19 DIAGNOSIS — R7303 Prediabetes: Secondary | ICD-10-CM | POA: Diagnosis not present

## 2021-01-19 DIAGNOSIS — Z0001 Encounter for general adult medical examination with abnormal findings: Secondary | ICD-10-CM | POA: Diagnosis not present

## 2021-01-19 DIAGNOSIS — J301 Allergic rhinitis due to pollen: Secondary | ICD-10-CM | POA: Diagnosis not present

## 2021-01-26 DIAGNOSIS — Z01818 Encounter for other preprocedural examination: Secondary | ICD-10-CM | POA: Diagnosis not present

## 2021-01-26 DIAGNOSIS — Z6841 Body Mass Index (BMI) 40.0 and over, adult: Secondary | ICD-10-CM | POA: Diagnosis not present

## 2021-02-09 DIAGNOSIS — J4520 Mild intermittent asthma, uncomplicated: Secondary | ICD-10-CM | POA: Diagnosis not present

## 2021-02-09 DIAGNOSIS — R7303 Prediabetes: Secondary | ICD-10-CM | POA: Diagnosis not present

## 2021-02-09 DIAGNOSIS — G4733 Obstructive sleep apnea (adult) (pediatric): Secondary | ICD-10-CM | POA: Diagnosis not present

## 2021-02-09 DIAGNOSIS — F32A Depression, unspecified: Secondary | ICD-10-CM | POA: Diagnosis not present

## 2021-02-09 DIAGNOSIS — Z9989 Dependence on other enabling machines and devices: Secondary | ICD-10-CM | POA: Diagnosis not present

## 2021-02-09 DIAGNOSIS — E559 Vitamin D deficiency, unspecified: Secondary | ICD-10-CM | POA: Diagnosis not present

## 2021-02-09 DIAGNOSIS — I10 Essential (primary) hypertension: Secondary | ICD-10-CM | POA: Diagnosis not present

## 2021-02-13 ENCOUNTER — Telehealth: Payer: Self-pay

## 2021-02-13 DIAGNOSIS — Z720 Tobacco use: Secondary | ICD-10-CM | POA: Diagnosis not present

## 2021-02-13 DIAGNOSIS — Z6841 Body Mass Index (BMI) 40.0 and over, adult: Secondary | ICD-10-CM | POA: Diagnosis not present

## 2021-02-13 DIAGNOSIS — J301 Allergic rhinitis due to pollen: Secondary | ICD-10-CM | POA: Diagnosis not present

## 2021-02-13 DIAGNOSIS — E782 Mixed hyperlipidemia: Secondary | ICD-10-CM | POA: Diagnosis not present

## 2021-02-13 DIAGNOSIS — R7303 Prediabetes: Secondary | ICD-10-CM | POA: Diagnosis not present

## 2021-02-13 DIAGNOSIS — I10 Essential (primary) hypertension: Secondary | ICD-10-CM | POA: Diagnosis not present

## 2021-02-13 DIAGNOSIS — J4530 Mild persistent asthma, uncomplicated: Secondary | ICD-10-CM | POA: Diagnosis not present

## 2021-02-13 NOTE — Telephone Encounter (Signed)
If asymptomatic and doing well and able to perform >4METs without issues, no further cardiac testing needed prior to surgery. She does not need to see me before surgery as she recently saw [name] unless she has cardiac complaints (chest pain, SOB, LE edema, palpitations etc).   Gwyndolyn Kaufman, MD

## 2021-02-13 NOTE — Telephone Encounter (Signed)
   Name: [name]  DOB: 1986/03/09  MRN: [id number]   Primary Cardiologist: Freada Bergeron, MD  Chart reviewed as part of pre-operative protocol coverage. Patient was contacted 02/13/2021 in reference to pre-operative risk assessment for pending surgery as outlined below.  Salinda L Heeg was last seen on 11/02/20 by [name].  Since that day, Vaniyah L Sarin has done well.   Reports no shortness of breath nor dyspnea on exertion. Reports no chest pain, pressure, or tightness. No edema, orthopnea, PND. Reports no palpitations.   According to the Revised Cardiac Risk Index (RCRI), her perioperative risk of major cardiac event is 0.9%. Her functional capacity is 7.01 METS according to the Duke Activity Status Index (DASI).  Therefore, based on ACC/AHA guidelines, the patient would be at acceptable risk for the planned procedure without further cardiovascular testing.   The patient was advised that if she develops new symptoms prior to surgery to contact our office to arrange for a follow-up visit, and she verbalized understanding.  I will route this recommendation to the requesting party via Epic fax function and remove from pre-op pool. Please call with questions.  [name], NP 02/13/2021, 3:12 PM

## 2021-02-13 NOTE — Telephone Encounter (Signed)
   [facility] Pre-operative Risk Assessment    Patient Name: [name]  DOB: 01/21/1986  MRN: [id number]   HEARTCARE STAFF: - Please ensure there is not already an duplicate clearance open for this procedure. - Under Visit Info/Reason for Call, type in Other and utilize the format Clearance MM/DD/YY or Clearance TBD. Do not use dashes or single digits. - If request is for dental extraction, please clarify the # of teeth to be extracted.  Request for surgical clearance:  1. What type of surgery is being performed? Laparoscopic bariatric surgery   2. When is this surgery scheduled? TBD  3. What type of clearance is required (medical clearance vs. Pharmacy clearance to hold med vs. Both)? Medical   4. Are there any medications that need to be held prior to surgery and how long? None   5. Practice name and name of physician performing surgery? [facility],   6. What is the office phone number? [phone]   7.   What is the office fax number? [phone]  8.   Anesthesia type (None, local, MAC general) ? None listed    Mendel Ryder 02/13/2021, 11:14 AM  _________________________________________________________________   (provider comments below)

## 2021-02-13 NOTE — Telephone Encounter (Signed)
    [name] DOB:  [birth year]  MRN:  [id number]   Primary Cardiologist: Freada Bergeron, MD  Chart reviewed as part of pre-operative protocol coverage. [name] was last seen by [name] 11/02/20. She has hx of fluid overload post delivery in 2018, morbid obesity, HTN, OSA (no CPAP recommended) asthma, bilpolar 1 disorder, former smoker, depression. At most recent clinic visit her youngest child was 37 months old and she was doing well from cardiac perspective. Her home Bp was well controlled and she was planning to undergo bariatric surgery. She had updated echocardiogram 11/25/20 with normal LVEF 55-60%, LV mildly dilated,  no RWMA, no significant valvular abnormalities. She was recommended to follow up in 3-4 months and care was transitioned to Dr. Johney Frame. This follow up is scheduled for 04/05/21.   Per most recent note from surgeon plan to perform bariatric surgery within next 30 days.   Will route to Dr. Johney Frame for recommendations.   [name], NP 02/13/2021, 1:27 PM

## 2021-03-09 DIAGNOSIS — Z9989 Dependence on other enabling machines and devices: Secondary | ICD-10-CM | POA: Diagnosis not present

## 2021-03-09 DIAGNOSIS — E559 Vitamin D deficiency, unspecified: Secondary | ICD-10-CM | POA: Diagnosis not present

## 2021-03-09 DIAGNOSIS — R519 Headache, unspecified: Secondary | ICD-10-CM | POA: Diagnosis not present

## 2021-03-09 DIAGNOSIS — I499 Cardiac arrhythmia, unspecified: Secondary | ICD-10-CM | POA: Diagnosis not present

## 2021-03-09 DIAGNOSIS — I5031 Acute diastolic (congestive) heart failure: Secondary | ICD-10-CM | POA: Diagnosis not present

## 2021-03-09 DIAGNOSIS — G4733 Obstructive sleep apnea (adult) (pediatric): Secondary | ICD-10-CM | POA: Diagnosis not present

## 2021-03-09 DIAGNOSIS — I10 Essential (primary) hypertension: Secondary | ICD-10-CM | POA: Diagnosis not present

## 2021-03-09 DIAGNOSIS — K5900 Constipation, unspecified: Secondary | ICD-10-CM | POA: Diagnosis not present

## 2021-03-09 DIAGNOSIS — R7303 Prediabetes: Secondary | ICD-10-CM | POA: Diagnosis not present

## 2021-03-09 DIAGNOSIS — Z6841 Body Mass Index (BMI) 40.0 and over, adult: Secondary | ICD-10-CM | POA: Diagnosis not present

## 2021-03-09 DIAGNOSIS — O903 Peripartum cardiomyopathy: Secondary | ICD-10-CM | POA: Diagnosis not present

## 2021-03-09 DIAGNOSIS — R5383 Other fatigue: Secondary | ICD-10-CM | POA: Diagnosis not present

## 2021-03-21 ENCOUNTER — Telehealth: Payer: Self-pay

## 2021-03-22 ENCOUNTER — Other Ambulatory Visit: Payer: Self-pay

## 2021-03-22 NOTE — Patient Outreach (Signed)
[facility]) Care Management  03/22/2021  [name] 1985/10/27 [id number]   Telephone Screen  Referral Date: 03/21/2021 Referral Source: Join EMMI Campaign Referral Reason: " Score 11, HTN"    Outreach attempt #  1 to patient. Spoke with patient who reported she was busy taking care of her kids at present and requested a call back after an hour.       Plan: RN CM will make outreach attempt to patient later today of within 3-4 business days.    Enzo Montgomery, RN,BSN,CCM [facility] [facility] Telephonic Care Management Coordinator Direct Phone: [phone] Toll Free: [phone] Fax: [phone]

## 2021-03-22 NOTE — Patient Outreach (Signed)
[facility]) Care Management  03/22/2021  [name] 1986-02-05 [id number]    Telephone Screen   Referral Date: 03/21/2021 Referral Source: Join EMMI Campaign Referral Reason: " Score 11, HTN"    Outreach call placed back to patient per her previous request. Spoke with patient. [facility] services discussed and explained. Verbal consent for services given by patient.   Social; Patient resides in her home along with her spouse and small children. She is independent with ADLs/IADLs. She denies any recent falls. She is able to drive herself to MD appts.   Conditions: Per chart review, patient has PMH that includes but not limited to HTN,morbid obesity(BMI 50), OSA-CPAP, and bipolar. Patient currently undergoing workup for bariatric surgery.      Medications Reviewed Today     Reviewed by Hayden Pedro, RN (Registered Nurse) on 03/22/21 at 1131  Med List Status: <None>   Medication Order Taking? Sig Documenting Provider Last Dose Status Informant  acetaminophen (TYLENOL) 500 MG tablet [id number] Yes Take 1,000 mg by mouth every 8 (eight) hours as needed for headache.  [provider] Taking Active Self  albuterol (PROVENTIL HFA;VENTOLIN HFA) 108 (90 BASE) MCG/ACT inhaler [id number] Yes Inhale 2 puffs into the lungs every 4 (four) hours as needed for wheezing or shortness of breath.  [provider] Taking Active Self  amLODipine (NORVASC) 10 MG tablet [id number] Yes Take 1 tablet (10 mg total) by mouth daily. Dorothy Spark, MD Taking Active Self  cholecalciferol (VITAMIN D3) 25 MCG (1000 UT) tablet [id number] Yes Take 1,000 Units by mouth daily. [provider] Taking Active Self  FLUoxetine (PROZAC) 40 MG capsule [id number] Yes Take 40 mg by mouth daily. [provider] Taking Active   furosemide (LASIX) 40 MG tablet [id number] Yes Take 0.5 tablets (20 mg total) by mouth daily as needed for fluid or edema. Burtis Junes, NP Taking Active Self  HYDROcodone-acetaminophen (NORCO/VICODIN) 5-325 MG tablet [id number] No Take 1 tablet by mouth every 6 (six) hours as needed for severe pain.  Patient not taking: Reported on 03/22/2021   Cheri Fowler, MD Not Taking Active   KRILL OIL PO [id number] Yes Take 1 capsule by mouth daily. [provider] Taking Active Self  labetalol (NORMODYNE) 200 MG tablet [id number]  Take 2 tablets (400 mg total) by mouth 2 (two) times daily. Carlynn Purl Reddell, DO  Expired 07/28/20 2359 Self  lisinopril (ZESTRIL) 40 MG tablet [id number] Yes Take 40 mg by mouth daily.  [provider] Taking Active Self  montelukast (SINGULAIR) 10 MG tablet [id number] No Take 10 mg by mouth at bedtime.  Patient not taking: Reported on 03/22/2021   [provider] Not Taking Active Self  Prenatal Vit-Fe Fumarate-FA (PRENATAL PO) [id number] No Take 1 tablet by mouth daily.  Patient not taking: Reported on 03/22/2021   [provider] Not Taking Active Self  spironolactone (ALDACTONE) 25 MG tablet [id number] No Take 0.5 tablets (12.5 mg total) by mouth daily.  Patient not taking: Reported on 03/22/2021   Dorothy Spark, MD Not Taking Active   [facility] INHUB 250-50 MCG/DOSE AEPB [id number] No Inhale 1 puff into the lungs daily.  Patient not taking: Reported on 03/22/2021   [provider] Not Taking Active Self            Fall Risk  03/22/2021 09/09/2017 07/09/2017 06/17/2017  Falls in the past year? 0 No No No  Number falls in past yr: 0 - - -  Injury with Fall? 0 - - -  Risk for fall due to : Medication side effect - - -  Follow up Falls evaluation completed;Education provided - - -    Depression screen [facility] 2/9 03/22/2021 09/09/2017 07/09/2017 07/03/2017 06/27/2017  Decreased Interest 0 1 0 0 0  Down, Depressed, Hopeless 0 '1 1 1 1  ' PHQ - 2 Score 0 '2 1 1 1  ' Altered sleeping - 1 - - -  Tired, decreased energy - 1 - - -  Change in appetite - 1 - - -   Feeling bad or failure about yourself  - 1 - - -  Trouble concentrating - 3 - - -  Moving slowly or fidgety/restless - 3 - - -  Suicidal thoughts - 0 - - -  PHQ-9 Score - 12 - - -  Difficult doing work/chores - Somewhat difficult - - -  Some recent data might be hidden    SDOH Screenings   Alcohol Screen: Not on file  Depression (PHQ2-9): Low Risk    PHQ-2 Score: 0  Financial Resource Strain: Not on file  Food Insecurity: Not on file  Housing: Not on file  Physical Activity: Not on file  Social Connections: Not on file  Stress: Not on file  Tobacco Use: Medium Risk   Smoking Tobacco Use: Former   Smokeless Tobacco Use: Never  Transportation Needs: No Transportation Needs   Lack of Transportation (Medical): No   Lack of Transportation (Non-Medical): No      Goals Addressed               This Visit's Progress     (THN)Track and Manage My Blood Pressure-Hypertension (pt-stated)        Timeframe:  Long-Range Goal Priority:  High Start Date:  03/22/2021                           Expected End Date:  07/07/2021                     Follow Up Date Aug 2022  Barriers: Health Behaviors Knowledge    - check blood pressure 3 times per week - choose a place to take my blood pressure (home, clinic or office, retail store) - write blood pressure results in a log or diary    Why is this important?   You won't feel high blood pressure, but it can still hurt your blood vessels.  High blood pressure can cause heart or kidney problems. It can also cause a stroke.  Making lifestyle changes like losing a little weight or eating less salt will help.  Checking your blood pressure at home and at different times of the day can help to control blood pressure.  If the doctor prescribes medicine remember to take it the way the doctor ordered.  Call the office if you cannot afford the medicine or if there are questions about it.     Notes:  03/22/2021-Patient taking multiple BP meds but  admits she does not monitor BP in the home regularly. Instructed and encouraged to start monitoring BP more closely in the home.       (THN)Weight Loss Achieved (pt-stated)           Timeframe:  Long-Range Goal Priority:  High Start Date: 03/22/2021  Expected End Date: 09/06/2021 Follow Up Date: Aug 2022   Barriers: Health Behaviors Knowledge  -pt will undergo pre-bariatric surgery workup -pt will continue to healthy eating habits                         Evidence-based guidance:  Review medication that may contribute to weight gain, such as corticosteroid, beta-blocker, tricyclic antidepressant, oral antihyperglycemic; advocate for changes when appropriate.  Perform or refer to registered dietitian to perform comprehensive nutrition assessment that includes disordered-eating behaviors, such as binge-eating, emotional or compulsive eating, grazing.  Counsel patient regarding health risks of obesity and that weight loss goal of 5 to 10 percent of initial weight will improve risk.  Recommend initial weight loss goal of 3 to 5 percent of bodyweight; increase weight-loss goals based on patient success as achieving greater weight loss continues to reduce risk.  Propose a calorie-reduced diet based on the patient's preferences and health status.  Provide ongoing emotional support or cognitive behavioral therapy and dietitian services (individual, group, virtual) over at least 6 months with a minimum of 14 encounters to best facilitate weight loss.  Provide monthly follow-up for 12 months when weight loss goal is met to assist with maintenance of weight loss.  Encourage increased physical activity or exercise based on individual age, risk, and ability up to 200 to 300 minutes per week that includes aerobic and resistance training.  Encourage reduction in sedentary behaviors by replacing them with nonexercise yet active leisure pursuits.  Identify physical barriers,  such as change in posture, balance, gait patterns, joint pain, and environmental barriers to activity.  Consider referral to rehabilitation therapy, especially when mobility or function is impaired due to osteoarthritis and obesity.  Consider referral to weight-loss program that has published evidence of safety and efficacy if on-site intensive intervention is unavailable or patient preference.  Prepare patient for use of pharmacologic therapy as an adjunct to lifestyle changes based on body mass index, patient agreement and presence of risk factors or comorbidities.  Evaluate efficacy of pharmacologic therapy (weight loss) and tolerance to medication periodically.  Engage in shared decision-making regarding referral to bariatric surgeon for consultation and evaluation when weight-loss goal has not been accomplished by behavioral therapy with or without pharmacologic therapy.   Notes:   03/22/2021 Patient reports that she is undergoing pre-work up for bariatric surgery. She is hopeful that surgery will take place within the next 2-5month. She is trying to exercise. She is trying to eat health.         Plan: RN CM discussed with patient next outreach within the month of Aug. Patient gave verbal consent and in agreement with RN CM follow up and timeframe. Patient aware that they may contact RN CM sooner for any issues or concerns. RN CM reviewed goals and plan of care with patient. Patient agrees to care plan and follow up. RN CM will send barriers letter and route encounter to PCP. RN CM will send welcome letter to patient.  REnzo Montgomery RN,BSN,CCM [facility] Telephonic Care Management Coordinator Direct Phone: [phone] Free: [phone]: [phone]
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
## 2021-04-05 ENCOUNTER — Other Ambulatory Visit: Payer: Self-pay

## 2021-04-05 ENCOUNTER — Encounter (HOSPITAL_BASED_OUTPATIENT_CLINIC_OR_DEPARTMENT_OTHER): Payer: Self-pay | Admitting: Cardiology

## 2021-04-05 ENCOUNTER — Ambulatory Visit (INDEPENDENT_AMBULATORY_CARE_PROVIDER_SITE_OTHER): Payer: Medicare HMO | Admitting: Cardiology

## 2021-04-05 ENCOUNTER — Telehealth: Payer: Self-pay | Admitting: Pharmacist

## 2021-04-05 VITALS — BP 158/94 | HR 78 | Ht 65.0 in | Wt 300.4 lb

## 2021-04-05 DIAGNOSIS — I10 Essential (primary) hypertension: Secondary | ICD-10-CM

## 2021-04-05 DIAGNOSIS — O1495 Unspecified pre-eclampsia, complicating the puerperium: Secondary | ICD-10-CM

## 2021-04-05 DIAGNOSIS — I110 Hypertensive heart disease with heart failure: Secondary | ICD-10-CM

## 2021-04-05 DIAGNOSIS — R002 Palpitations: Secondary | ICD-10-CM | POA: Diagnosis not present

## 2021-04-05 MED ORDER — OZEMPIC (0.25 OR 0.5 MG/DOSE) 2 MG/1.5ML ~~LOC~~ SOPN: 0.2500 mg | PEN_INJECTOR | SUBCUTANEOUS | 0 refills | Status: DC

## 2021-04-05 MED ORDER — FUROSEMIDE 40 MG PO TABS: 20.0000 mg | ORAL_TABLET | Freq: Every day | ORAL | 6 refills | Status: DC | PRN

## 2021-04-05 MED ORDER — CARVEDILOL 12.5 MG PO TABS: 12.5000 mg | ORAL_TABLET | Freq: Two times a day (BID) | ORAL | 2 refills | Status: DC

## 2021-04-05 NOTE — Telephone Encounter (Signed)
Received request from Dr Johney Frame regarding [id number] therapy for weight loss. Pt has [employer] which will not cover Wegovy, however they do have Ozempic on formulary, no prior auth needed. Copay is $45/1 month or $125/3 month supply.  Spoke with pt who is interested in trying Ozempic, she is ok with the copay. She is not currently pregnant and is not trying to become pregnant. No personal or family history of thyroid cancer.  Rx sent to pharmacy for Ozempic 0.25mg  once weekly dosing. Scheduled pt appt with PharmD next available time on 7/18 for Ozempic injection training. She was appreciative for the phone call.

## 2021-04-05 NOTE — Patient Instructions (Signed)
Medication Instructions:   STOP TAKING LABETALOL NOW  START TAKING CARVEDILOL 12.5 MG BY MOUTH TWICE DAILY  *If you need a refill on your cardiac medications before your next appointment, please call your pharmacy*    Follow-Up: At [facility], you and your health needs are our priority.  As part of our continuing mission to provide you with exceptional heart care, we have created designated Provider Care Teams.  These Care Teams include your primary Cardiologist (physician) and Advanced Practice Providers (APPs -  Physician Assistants and Nurse Practitioners) who all work together to provide you with the care you need, when you need it.  We recommend signing up for the patient portal called "MyChart".  Sign up information is provided on this After Visit Summary.  MyChart is used to connect with patients for Virtual Visits (Telemedicine).  Patients are able to view lab/test results, encounter notes, upcoming appointments, etc.  Non-urgent messages can be sent to your provider as well.   To learn more about what you can do with MyChart, go to [email or web address].    Your next appointment:   6 month(s)  The format for your next appointment:   In Person  Provider:   You may see Freada Bergeron, MD or one of the following Advanced Practice Providers on your designated Care Team:   Richardson Dopp, PA-C Vin Bhagat, [place]   Other Instructions  PLEASE [place] TO DR. [place] TO REVIEW

## 2021-04-12 ENCOUNTER — Encounter (HOSPITAL_BASED_OUTPATIENT_CLINIC_OR_DEPARTMENT_OTHER): Payer: Self-pay

## 2021-04-12 DIAGNOSIS — G4733 Obstructive sleep apnea (adult) (pediatric): Secondary | ICD-10-CM | POA: Diagnosis not present

## 2021-04-13 NOTE — Telephone Encounter (Signed)
Spoke to patient . Patient states she is unable to obtain a blood pressure monitor due to finance.  Rn informed the patient - she can obtain a new machine from [employer] street office. She can pick up today . It will help her check her blood pressures on a more consistently.  Patient voiced understanding and very appreciative. She states she will be able to pick it up today .

## 2021-04-17 DIAGNOSIS — Z713 Dietary counseling and surveillance: Secondary | ICD-10-CM | POA: Diagnosis not present

## 2021-04-24 ENCOUNTER — Other Ambulatory Visit: Payer: Self-pay

## 2021-04-24 ENCOUNTER — Ambulatory Visit (INDEPENDENT_AMBULATORY_CARE_PROVIDER_SITE_OTHER): Payer: Medicare HMO | Admitting: Pharmacist

## 2021-04-24 NOTE — Patient Instructions (Signed)
Check out the pod-cast Zoe Call me at [phone] with any questions  GLP-1 Receptor Agonist Counseling Points This medication reduces your appetite and may make you feel fuller longer.  Stop eating when your body tells you that you are full. This will likely happen sooner than you are used to. Store your medication in the fridge until you are ready to use it. Inject your medication in the fatty tissue of your lower abdominal area (2 inches away from belly button) or upper outer thigh. Common side effects include: nausea, diarrhea/constipation, and heartburn, and are more likely to occur if you overeat.   Tips for living a healthier life  SUGAR  Sugar is a huge problem in the modern day diet. Sugar is a big contributor to heart disease, diabetes, high triglyceride levels, fatty liver disease and obesity. Sugar is hidden in almost all packaged foods/beverages. Added sugar is extra sugar that is added beyond what is naturally found and has no nutritional benefit for your body. The American Heart Association recommends limiting added sugars to no more than 25g for women and 36 grams for men per day. There are many names for sugar including maltose, sucrose (names ending in "ose"), high fructose corn syrup, molasses, cane sugar, corn sweetener, raw sugar, syrup, honey or fruit juice concentrate.   One of the best ways to limit your added sugars is to stop drinking sweetened beverages such as soda, sweet tea, and fruit juice. There is 65g of added sugars in one 20oz bottle of Coke! That is equal to 6 donuts.   Pay attention and read all nutrition facts labels. Below is an examples of a nutrition facts label. The #1 is showing you the total sugars where the # 2 is showing you the added sugars. This one serving has almost the max amount of added sugars per day!     EXERCISE  Exercise is good. We've all heard that. In an ideal world, we would all have time and resources to get plenty of it. When  you are active, your heart pumps more efficiently and you will feel better.  Multiple studies show that even walking regularly has benefits that include living a longer life. The American Heart Association recommends 150 minutes per week of exercise (30 minutes per day most days of the week). You can do this in any increment you wish. Nine or more 10-minute walks count. So does an hour-long exercise class. Break the time apart into what will work in your life. Some of the best things you can do include walking briskly, jogging, cycling or swimming laps. Not everyone is ready to "exercise." Sometimes we need to start with just getting active. Here are some easy ways to be more active throughout the day:  Take the stairs instead of the elevator  Go for a 10-15 minute walk during your lunch break (find a friend to make it more enjoyable)  When shopping, park at the back of the parking lot  If you take public transportation, get off one stop early and walk the extra distance  Pace around while making phone calls  Check with your doctor if you aren't sure what your limitations may be. Always remember to drink plenty of water when doing any type of exercise. Don't feel like a failure if you're not getting the 90-150 minutes per week. If you started by being a couch potato, then just a 10-minute walk each day is a huge improvement. Start with little victories and work your  way up.   HEALTHY EATING TIPS  When looking to improve your eating habits, whether to lose weight, lower blood pressure or just be healthier, it helps to know what a serving size is.   Grains 1 slice of bread,  bagel,  cup pasta or rice  Vegetables 1 cup fresh or raw vegetables,  cup cooked or canned Fruits 1 piece of medium sized fruit,  cup canned,   Meats/Proteins  cup dried       1 oz meat, 1 egg,  cup cooked beans, nuts or seeds  Dairy        Fats Individual yogurt container, 1 cup (8oz)    1 teaspoon margarine/butter or  vegetable  milk or milk alternative, 1 slice of cheese          oil; 1 tablespoon mayonnaise or salad dressing                  Plan ahead: make a menu of the meals for a week then create a grocery list to go with that menu. Consider meals that easily stretch into a night of leftovers, such as stews or casseroles. Or consider making two of your favorite meal and put one in the freezer for another night. Try a night or two each week that is "meatless" or "no cook" such as salads. When you get home from the grocery store wash and prepare your vegetables and fruits. Then when you need them they are ready to go.   Tips for going to the grocery store:  [employer] store or generic brands  Check the weekly ad from your store on-line or in their in-store flyer  Look at the unit price on the shelf tag to compare/contrast the costs of different items  Buy fruits/vegetables in season  Carrots, bananas and apples are low-cost, naturally healthy items  If meats or frozen vegetables are on sale, buy some extras and put in your freezer  Limit buying prepared or "ready to eat" items, even if they are pre-made salads or fruit snacks  Do not shop when you're hungry  Foods at eye level tend to be more expensive. Look on the high and low shelves for deals.  Consider shopping at the farmer's market for fresh foods in season.  Avoid the cookie and chip aisles (these are expensive, high in calories and low in nutritional value). Shop on the outside of the grocery store.  Healthy food preparations:  If you can't get lean hamburger, be sure to drain the fat when cooking  Steam, saut (in olive oil), grill or bake foods  Experiment with different seasonings to avoid adding salt to your foods. Kosher salt, sea salt and Himalayan salt are all still salt and should be avoided. Try seasoning food with onion, garlic, thyme, rosemary, basil ect. Onion powder or garlic powder is ok. Avoid if it says salt (ie garlic salt).         Other resources: American Heart Association - InstantFinish.fi         Go to the Healthy Living tab to get more information American Diabetes Association - www.diabetes.org         You don't have to be diabetic - check out the Food and Fitness tab
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
## 2021-05-18 ENCOUNTER — Telehealth: Payer: Self-pay | Admitting: Pharmacist

## 2021-05-18 DIAGNOSIS — E559 Vitamin D deficiency, unspecified: Secondary | ICD-10-CM | POA: Diagnosis not present

## 2021-05-18 DIAGNOSIS — G4733 Obstructive sleep apnea (adult) (pediatric): Secondary | ICD-10-CM | POA: Diagnosis not present

## 2021-05-18 DIAGNOSIS — F32A Depression, unspecified: Secondary | ICD-10-CM | POA: Diagnosis not present

## 2021-05-18 DIAGNOSIS — Z9989 Dependence on other enabling machines and devices: Secondary | ICD-10-CM | POA: Diagnosis not present

## 2021-05-18 DIAGNOSIS — I10 Essential (primary) hypertension: Secondary | ICD-10-CM | POA: Diagnosis not present

## 2021-05-18 DIAGNOSIS — R7303 Prediabetes: Secondary | ICD-10-CM | POA: Diagnosis not present

## 2021-05-18 DIAGNOSIS — J4520 Mild intermittent asthma, uncomplicated: Secondary | ICD-10-CM | POA: Diagnosis not present

## 2021-05-18 MED ORDER — OZEMPIC (0.25 OR 0.5 MG/DOSE) 2 MG/1.5ML ~~LOC~~ SOPN
0.5000 mg | PEN_INJECTOR | SUBCUTANEOUS | 0 refills | Status: DC
Start: 2021-05-18 — End: 2021-06-13

## 2021-05-18 MED ORDER — OZEMPIC (0.25 OR 0.5 MG/DOSE) 2 MG/1.5ML ~~LOC~~ SOPN: 0.2500 mg | PEN_INJECTOR | SUBCUTANEOUS | 0 refills | Status: DC

## 2021-05-18 NOTE — Telephone Encounter (Signed)
Called patient to see how she was doing on ozempic. She states she is doing very well. She has lost weight and has made changes to her diet. She is prepared to increase dose to 0.'5mg'$  on Monday. New Rx sent to pharmacy

## 2021-05-22 DIAGNOSIS — E782 Mixed hyperlipidemia: Secondary | ICD-10-CM | POA: Diagnosis not present

## 2021-05-22 DIAGNOSIS — Z720 Tobacco use: Secondary | ICD-10-CM | POA: Diagnosis not present

## 2021-05-22 DIAGNOSIS — N760 Acute vaginitis: Secondary | ICD-10-CM | POA: Diagnosis not present

## 2021-05-22 DIAGNOSIS — J4530 Mild persistent asthma, uncomplicated: Secondary | ICD-10-CM | POA: Diagnosis not present

## 2021-05-22 DIAGNOSIS — R7303 Prediabetes: Secondary | ICD-10-CM | POA: Diagnosis not present

## 2021-05-22 DIAGNOSIS — Z6841 Body Mass Index (BMI) 40.0 and over, adult: Secondary | ICD-10-CM | POA: Diagnosis not present

## 2021-05-22 DIAGNOSIS — I10 Essential (primary) hypertension: Secondary | ICD-10-CM | POA: Diagnosis not present

## 2021-05-22 DIAGNOSIS — J301 Allergic rhinitis due to pollen: Secondary | ICD-10-CM | POA: Diagnosis not present

## 2021-05-23 ENCOUNTER — Other Ambulatory Visit: Payer: Self-pay

## 2021-05-23 NOTE — Patient Outreach (Signed)
[facility]) Care Management  05/23/2021  [name] [birth year] [id number]   Telephone Assessment    Successful outreach call placed to patient. She denies any acute issues or concerns at present. She denies any RN CM needs or concerns.   Medications Reviewed Today     Reviewed by Hayden Pedro, RN (Registered Nurse) on 05/23/21 at [id number]  Med List Status: <None>   Medication Order Taking? Sig Documenting Provider Last Dose Status Informant  acetaminophen (TYLENOL) 500 MG tablet [id number] No Take 1,000 mg by mouth every 8 (eight) hours as needed for headache.  [provider] Taking Active Self  albuterol (PROVENTIL HFA;VENTOLIN HFA) 108 (90 BASE) MCG/ACT inhaler [id number] No Inhale 2 puffs into the lungs every 4 (four) hours as needed for wheezing or shortness of breath.  [provider] Taking Active Self  amLODipine (NORVASC) 10 MG tablet [id number] No Take 1 tablet (10 mg total) by mouth daily. Dorothy Spark, MD Taking Active Self  carvedilol (COREG) 12.5 MG tablet [id number]  Take 1 tablet (12.5 mg total) by mouth 2 (two) times daily. Freada Bergeron, MD  Active   cholecalciferol (VITAMIN D3) 25 MCG (1000 UT) tablet [id number] No Take 1,000 Units by mouth daily. [provider] Taking Active Self  furosemide (LASIX) 40 MG tablet [id number]  Take 0.5 tablets (20 mg total) by mouth daily as needed for fluid or edema. Freada Bergeron, MD  Active   KRILL OIL PO [id number] No Take 1 capsule by mouth daily. [provider] Taking Active Self  lisinopril (ZESTRIL) 40 MG tablet [id number] No Take 40 mg by mouth daily.  [provider] Taking Active Self  Semaglutide,0.25 or 0.5MG /DOS, (OZEMPIC, 0.25 OR 0.5 MG/DOSE,) 2 MG/1.5ML SOPN [id number]  Inject 0.5 mg into the skin once a week. Freada Bergeron, MD  Active              Goals Addressed               This Visit's Progress      (THN)Track and Manage My Blood Pressure-Hypertension (pt-stated)        Timeframe:  Long-Range Goal Priority:  High Start Date:  03/22/2021                           Expected End Date:  07/07/2021                     Follow Up Date Oct 2022  Barriers: Health Behaviors Knowledge    - check blood pressure 3 times per week - choose a place to take my blood pressure (home, clinic or office, retail store) - write blood pressure results in a log or diary    Why is this important?   You won't feel high blood pressure, but it can still hurt your blood vessels.  High blood pressure can cause heart or kidney problems. It can also cause a stroke.  Making lifestyle changes like losing a little weight or eating less salt will help.  Checking your blood pressure at home and at different times of the day can help to control blood pressure.  If the doctor prescribes medicine remember to take it the way the doctor ordered.  Call the office if you cannot afford the medicine or if there are questions about it.     Notes:  03/22/2021-Patient taking multiple BP meds but admits she does not  monitor BP in the home regularly. Instructed and encouraged to start monitoring BP more closely in the home.  05/23/21-Patient reports she has not been checking BP 3x/wk but unable to provide a reason. She voices she will try to monitor BP more consistently. Last home reading was 117/81.       (THN)Weight Loss Achieved (pt-stated)         Timeframe:  Long-Range Goal Priority:  High Start Date: 03/22/2021                            Expected End Date: 09/06/2021 Follow Up Date: Aug 2022   Barriers: Health Behaviors Knowledge  -pt will undergo pre-bariatric surgery workup -pt will continue with healthy eating habit-pt will lose wgt                         Evidence-based guidance:  Review medication that may contribute to weight gain, such as corticosteroid, beta-blocker, tricyclic antidepressant, oral  antihyperglycemic; advocate for changes when appropriate.  Perform or refer to registered dietitian to perform comprehensive nutrition assessment that includes disordered-eating behaviors, such as binge-eating, emotional or compulsive eating, grazing.  Counsel patient regarding health risks of obesity and that weight loss goal of 5 to 10 percent of initial weight will improve risk.  Recommend initial weight loss goal of 3 to 5 percent of bodyweight; increase weight-loss goals based on patient success as achieving greater weight loss continues to reduce risk.  Propose a calorie-reduced diet based on the patient's preferences and health status.  Provide ongoing emotional support or cognitive behavioral therapy and dietitian services (individual, group, virtual) over at least 6 months with a minimum of 14 encounters to best facilitate weight loss.  Provide monthly follow-up for 12 months when weight loss goal is met to assist with maintenance of weight loss.  Encourage increased physical activity or exercise based on individual age, risk, and ability up to 200 to 300 minutes per week that includes aerobic and resistance training.  Encourage reduction in sedentary behaviors by replacing them with nonexercise yet active leisure pursuits.  Identify physical barriers, such as change in posture, balance, gait patterns, joint pain, and environmental barriers to activity.  Consider referral to rehabilitation therapy, especially when mobility or function is impaired due to osteoarthritis and obesity.  Consider referral to weight-loss program that has published evidence of safety and efficacy if on-site intensive intervention is unavailable or patient preference.  Prepare patient for use of pharmacologic therapy as an adjunct to lifestyle changes based on body mass index, patient agreement and presence of risk factors or comorbidities.  Evaluate efficacy of pharmacologic therapy (weight loss) and tolerance to  medication periodically.  Engage in shared decision-making regarding referral to bariatric surgeon for consultation and evaluation when weight-loss goal has not been accomplished by behavioral therapy with or without pharmacologic therapy.   Notes:   03/22/2021 Patient reports that she is undergoing pre-work up for bariatric surgery. She is hopeful that surgery will take place within the next 2-29months. She is trying to exercise. She is trying to eat health.  05/23/2021-Patient reports that she has lost about 15lbs. She continues to go through the pre-work up eval for bariatric surgery. She has bene approved and scheduled for surgery on 07/03/21.          Plan: RN CM discussed with patient next outreach after scheduled surgery-within the month of Oct Patient agrees to care plan  and follow up. Patient gave verbal consent and in agreement with RN CM follow up and timeframe. Patient aware that they may contact RN CM sooner for any issues or concerns. RN CM reviewed goals and plan of care with patient.  Enzo Montgomery, RN,BSN,CCM [facility] Telephonic Care Management Coordinator Direct Phone: [phone] Toll Free: [phone] Fax: [phone]

## 2021-05-29 ENCOUNTER — Other Ambulatory Visit: Payer: Self-pay

## 2021-05-29 DIAGNOSIS — I10 Essential (primary) hypertension: Secondary | ICD-10-CM

## 2021-05-30 MED ORDER — LISINOPRIL 40 MG PO TABS: 40.0000 mg | ORAL_TABLET | Freq: Every day | ORAL | 0 refills | Status: DC

## 2021-06-05 ENCOUNTER — Ambulatory Visit (HOSPITAL_COMMUNITY)
Admission: EM | Admit: 2021-06-05 | Discharge: 2021-06-05 | Disposition: A | Discharge status: Home / Self Care | Payer: Medicare HMO | Attending: Emergency Medicine | Admitting: Emergency Medicine

## 2021-06-05 ENCOUNTER — Telehealth: Payer: Self-pay | Admitting: Cardiology

## 2021-06-05 ENCOUNTER — Encounter (HOSPITAL_COMMUNITY): Payer: Self-pay | Admitting: Emergency Medicine

## 2021-06-05 ENCOUNTER — Other Ambulatory Visit: Payer: Self-pay

## 2021-06-05 DIAGNOSIS — T754XXA Electrocution, initial encounter: Secondary | ICD-10-CM
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
## 2021-06-05 NOTE — Discharge Instructions (Addendum)
Keep taking your medications as prescribed.   Follow up with your cardiologist and primary care providers for re-evaluation.
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
## 2021-06-06 NOTE — Telephone Encounter (Signed)
Pt did go to urgent care for this issue.  She was evaluated for this complaint.

## 2021-06-09 DIAGNOSIS — R7303 Prediabetes: Secondary | ICD-10-CM | POA: Diagnosis not present

## 2021-06-09 DIAGNOSIS — Z9989 Dependence on other enabling machines and devices: Secondary | ICD-10-CM | POA: Diagnosis not present

## 2021-06-09 DIAGNOSIS — E559 Vitamin D deficiency, unspecified: Secondary | ICD-10-CM | POA: Diagnosis not present

## 2021-06-09 DIAGNOSIS — G4733 Obstructive sleep apnea (adult) (pediatric): Secondary | ICD-10-CM | POA: Diagnosis not present

## 2021-06-09 DIAGNOSIS — Z01818 Encounter for other preprocedural examination: Secondary | ICD-10-CM | POA: Diagnosis not present

## 2021-06-09 DIAGNOSIS — I10 Essential (primary) hypertension: Secondary | ICD-10-CM | POA: Diagnosis not present

## 2021-06-09 DIAGNOSIS — J4520 Mild intermittent asthma, uncomplicated: Secondary | ICD-10-CM | POA: Diagnosis not present

## 2021-06-13 ENCOUNTER — Telehealth: Payer: Self-pay | Admitting: Pharmacist

## 2021-06-13 MED ORDER — OZEMPIC (1 MG/DOSE) 4 MG/3ML ~~LOC~~ SOPN: 1.0000 mg | PEN_INJECTOR | SUBCUTANEOUS | 11 refills | Status: DC

## 2021-06-13 NOTE — Telephone Encounter (Signed)
Spoke with patient. She states she is doing well on the ozempic 0.'5mg'$  weekly. States she has lost some weight. Is ready to increase to '1mg'$ . I have sent rx to pt pharmacy. She is to call back if she has issues getting.I will call her in 4 weeks to follow up.

## 2021-06-20 DIAGNOSIS — E782 Mixed hyperlipidemia: Secondary | ICD-10-CM | POA: Diagnosis not present

## 2021-06-20 DIAGNOSIS — R3915 Urgency of urination: Secondary | ICD-10-CM | POA: Diagnosis not present

## 2021-06-20 DIAGNOSIS — I10 Essential (primary) hypertension: Secondary | ICD-10-CM | POA: Diagnosis not present

## 2021-06-20 DIAGNOSIS — J4530 Mild persistent asthma, uncomplicated: Secondary | ICD-10-CM | POA: Diagnosis not present

## 2021-06-20 DIAGNOSIS — Z720 Tobacco use: Secondary | ICD-10-CM | POA: Diagnosis not present

## 2021-06-20 DIAGNOSIS — J301 Allergic rhinitis due to pollen: Secondary | ICD-10-CM | POA: Diagnosis not present

## 2021-06-20 DIAGNOSIS — Z6841 Body Mass Index (BMI) 40.0 and over, adult: Secondary | ICD-10-CM | POA: Diagnosis not present

## 2021-06-20 DIAGNOSIS — R7303 Prediabetes: Secondary | ICD-10-CM | POA: Diagnosis not present

## 2021-07-03 DIAGNOSIS — F913 Oppositional defiant disorder: Secondary | ICD-10-CM | POA: Diagnosis not present

## 2021-07-03 DIAGNOSIS — I509 Heart failure, unspecified: Secondary | ICD-10-CM | POA: Diagnosis not present

## 2021-07-03 DIAGNOSIS — K219 Gastro-esophageal reflux disease without esophagitis: Secondary | ICD-10-CM | POA: Diagnosis not present

## 2021-07-03 DIAGNOSIS — F32A Depression, unspecified: Secondary | ICD-10-CM | POA: Diagnosis not present

## 2021-07-03 DIAGNOSIS — J4520 Mild intermittent asthma, uncomplicated: Secondary | ICD-10-CM | POA: Diagnosis not present

## 2021-07-03 DIAGNOSIS — G4733 Obstructive sleep apnea (adult) (pediatric): Secondary | ICD-10-CM | POA: Diagnosis not present

## 2021-07-03 DIAGNOSIS — Z9989 Dependence on other enabling machines and devices: Secondary | ICD-10-CM | POA: Diagnosis not present

## 2021-07-03 DIAGNOSIS — F419 Anxiety disorder, unspecified: Secondary | ICD-10-CM | POA: Diagnosis not present

## 2021-07-03 DIAGNOSIS — G4730 Sleep apnea, unspecified: Secondary | ICD-10-CM | POA: Diagnosis not present

## 2021-07-03 DIAGNOSIS — F3130 Bipolar disorder, current episode depressed, mild or moderate severity, unspecified: Secondary | ICD-10-CM | POA: Diagnosis not present

## 2021-07-03 DIAGNOSIS — Z6841 Body Mass Index (BMI) 40.0 and over, adult: Secondary | ICD-10-CM | POA: Diagnosis not present

## 2021-07-03 DIAGNOSIS — I10 Essential (primary) hypertension: Secondary | ICD-10-CM | POA: Diagnosis not present

## 2021-07-03 DIAGNOSIS — R7303 Prediabetes: Secondary | ICD-10-CM | POA: Diagnosis not present

## 2021-07-03 DIAGNOSIS — J45909 Unspecified asthma, uncomplicated: Secondary | ICD-10-CM | POA: Diagnosis not present

## 2021-07-03 DIAGNOSIS — E559 Vitamin D deficiency, unspecified: Secondary | ICD-10-CM | POA: Diagnosis not present

## 2021-07-11 ENCOUNTER — Other Ambulatory Visit: Payer: Self-pay

## 2021-07-11 DIAGNOSIS — G4733 Obstructive sleep apnea (adult) (pediatric): Secondary | ICD-10-CM | POA: Diagnosis not present

## 2021-07-11 NOTE — Patient Outreach (Signed)
[facility]) Care Management  07/11/2021  [name] 18-Oct-1985 [id number]   Telephone Assessment   Successful outreach call paled to patient. She is pleased to share how things are going. She had successful gastric bypass surgery and doing well. She denies any RN CM needs or concerns at this time.   Medications Reviewed Today     Reviewed by Hayden Pedro, RN (Registered Nurse) on 07/11/21 at 45  Med List Status: <None>   Medication Order Taking? Sig Documenting Provider Last Dose Status Informant  acetaminophen (TYLENOL) 500 MG tablet [id number]  Take 1,000 mg by mouth every 8 (eight) hours as needed for headache.  [provider]  Active Self  albuterol (PROVENTIL HFA;VENTOLIN HFA) 108 (90 BASE) MCG/ACT inhaler [id number]  Inhale 2 puffs into the lungs every 4 (four) hours as needed for wheezing or shortness of breath.  [provider]  Active Self  amLODipine (NORVASC) 10 MG tablet [id number] No Take 1 tablet (10 mg total) by mouth daily.  Patient not taking: Reported on 07/11/2021   Dorothy Spark, MD Not Taking Active Self  bismuth subsalicylate (PEPTO BISMOL) 262 MG/15ML suspension [id number] Yes Take 30 mLs by mouth every 6 (six) hours as needed. [provider]  Active Self  calcium carbonate (TUMS - DOSED IN MG ELEMENTAL CALCIUM) 500 MG chewable tablet [id number] Yes Chew 1 tablet by mouth 3 (three) times daily. [provider]  Active Self  carvedilol (COREG) 12.5 MG tablet [id number]  Take 1 tablet (12.5 mg total) by mouth 2 (two) times daily. Freada Bergeron, MD  Active   cholecalciferol (VITAMIN D3) 25 MCG (1000 UT) tablet [id number]  Take 1,000 Units by mouth daily. [provider]  Active Self  furosemide (LASIX) 40 MG tablet [id number] No Take 0.5 tablets (20 mg total) by mouth daily as needed for fluid or edema.  Patient not taking: Reported on 07/11/2021   Freada Bergeron, MD Not  Taking Active   KRILL OIL PO [id number]  Take 1 capsule by mouth daily. [provider]  Active Self  lisinopril (ZESTRIL) 40 MG tablet [id number] No Take 1 tablet (40 mg total) by mouth daily.  Patient not taking: Reported on 07/11/2021   Freada Bergeron, MD Not Taking Active   Semaglutide, 1 MG/DOSE, (OZEMPIC, 1 MG/DOSE,) 4 MG/3ML SOPN [id number]  Inject 1 mg into the skin once a week. Freada Bergeron, MD  Active              Goals Addressed               This Visit's Progress     (THN)Track and Manage My Blood Pressure-Hypertension (pt-stated)        Timeframe:  Long-Range Goal Priority:  High Start Date:  03/22/2021                           Expected End Date:  10/06/2021                     Follow Up Date Dec 2022  Barriers: Health Behaviors Knowledge    - check blood pressure 3 times per week - choose a place to take my blood pressure (home, clinic or office, retail store) - write blood pressure results in a log or diary    Why is this important?   You won't feel high blood pressure, but it can still hurt your blood  vessels.  High blood pressure can cause heart or kidney problems. It can also cause a stroke.  Making lifestyle changes like losing a little weight or eating less salt will help.  Checking your blood pressure at home and at different times of the day can help to control blood pressure.  If the doctor prescribes medicine remember to take it the way the doctor ordered.  Call the office if you cannot afford the medicine or if there are questions about it.     Notes:  03/22/2021-Patient taking multiple BP meds but admits she does not monitor BP in the home regularly. Instructed and encouraged to start monitoring BP more closely in the home.  05/23/21-Patient reports she has not been checking BP 3x/wk but unable to provide a reason. She voices she will try to monitor BP more consistently. Last home reading was 117/81.   07/11/21-Patient  reported BP 124/83. MDs have taken her off a few of BP meds since recent wgt loss surgery. She voices she is adhering to low salt diet although she is currently on clear liquid diet.       (THN)Weight Loss Achieved (pt-stated)         Timeframe:  Long-Range Goal Priority:  High Start Date: 03/22/2021                            Expected End Date: 10/06/2021 Follow Up Date: Dec 2022   Barriers: Health Behaviors Knowledge  -pt will undergo pre-bariatric surgery workup -pt will continue with healthy eating habit-pt will lose wgt                         Evidence-based guidance:  Review medication that may contribute to weight gain, such as corticosteroid, beta-blocker, tricyclic antidepressant, oral antihyperglycemic; advocate for changes when appropriate.  Perform or refer to registered dietitian to perform comprehensive nutrition assessment that includes disordered-eating behaviors, such as binge-eating, emotional or compulsive eating, grazing.  Counsel patient regarding health risks of obesity and that weight loss goal of 5 to 10 percent of initial weight will improve risk.  Recommend initial weight loss goal of 3 to 5 percent of bodyweight; increase weight-loss goals based on patient success as achieving greater weight loss continues to reduce risk.  Propose a calorie-reduced diet based on the patient's preferences and health status.  Provide ongoing emotional support or cognitive behavioral therapy and dietitian services (individual, group, virtual) over at least 6 months with a minimum of 14 encounters to best facilitate weight loss.  Provide monthly follow-up for 12 months when weight loss goal is met to assist with maintenance of weight loss.  Encourage increased physical activity or exercise based on individual age, risk, and ability up to 200 to 300 minutes per week that includes aerobic and resistance training.  Encourage reduction in sedentary behaviors by replacing them with  nonexercise yet active leisure pursuits.  Identify physical barriers, such as change in posture, balance, gait patterns, joint pain, and environmental barriers to activity.  Consider referral to rehabilitation therapy, especially when mobility or function is impaired due to osteoarthritis and obesity.  Consider referral to weight-loss program that has published evidence of safety and efficacy if on-site intensive intervention is unavailable or patient preference.  Prepare patient for use of pharmacologic therapy as an adjunct to lifestyle changes based on body mass index, patient agreement and presence of risk factors or comorbidities.  Evaluate efficacy of  pharmacologic therapy (weight loss) and tolerance to medication periodically.  Engage in shared decision-making regarding referral to bariatric surgeon for consultation and evaluation when weight-loss goal has not been accomplished by behavioral therapy with or without pharmacologic therapy.   Notes:   03/22/2021 Patient reports that she is undergoing pre-work up for bariatric surgery. She is hopeful that surgery will take place within the next 2-15month. She is trying to exercise. She is trying to eat health.  05/23/2021-Patient reports that she has lost about 15lbs. She continues to go through the pre-work up eval for bariatric surgery. She has bene approved and scheduled for surgery on 07/03/21.  07/11/21-Patient has successful gastric bypass surgery on 04/02/21. She went for staples removal appt on yesterday and was told everything was looking good. She is on clear liquid diet and tolerating well. She states wgt right before surgery was 292 lbs and current wgt 285 lbs.         Plan: RN CM discussed with patient next outreach within the month of Dec. Patient agrees to care plan and follow up. Patient gave verbal consent and in agreement with RN CM follow up and timeframe. Patient aware that they may contact RN CM sooner for any issues or  concerns. RN CM reviewed goals and plan of care with patient. RN CM will send quarterly update to PCP.  REnzo Montgomery RN,BSN,CCM [place] Telephonic Care Management Coordinator Direct Phone: [phone] Free: [phone]: [phone]

## 2021-07-31 DIAGNOSIS — Z713 Dietary counseling and surveillance: Secondary | ICD-10-CM | POA: Diagnosis not present

## 2021-08-10 ENCOUNTER — Other Ambulatory Visit: Payer: Self-pay

## 2021-08-10 DIAGNOSIS — I10 Essential (primary) hypertension: Secondary | ICD-10-CM

## 2021-08-10 MED ORDER — LISINOPRIL 40 MG PO TABS: 40.0000 mg | ORAL_TABLET | Freq: Every day | ORAL | 2 refills | Status: DC

## 2021-08-11 ENCOUNTER — Other Ambulatory Visit: Payer: Self-pay | Admitting: *Deleted

## 2021-08-17 ENCOUNTER — Telehealth: Payer: Self-pay | Admitting: Pharmacist

## 2021-08-17 NOTE — Telephone Encounter (Signed)
Called pt to confirm that she is off of Ozempic post bariatric surgery. She states she is no longer taking. Surgery went well. She states that her bariatric nurse asked her to call us to set up an apt to discuss her BP meds as she loses weight. Her BP at last OV was 114/82. She is taking lisinopril, carvedilol and amlodipine. She will check her BP for [place] and will come into the office to review and discuss on 11/28.

## 2021-08-30 DIAGNOSIS — Z0001 Encounter for general adult medical examination with abnormal findings: Secondary | ICD-10-CM | POA: Diagnosis not present

## 2021-08-30 DIAGNOSIS — E782 Mixed hyperlipidemia: Secondary | ICD-10-CM | POA: Diagnosis not present

## 2021-08-30 DIAGNOSIS — R3915 Urgency of urination: Secondary | ICD-10-CM | POA: Diagnosis not present

## 2021-08-30 DIAGNOSIS — Z6841 Body Mass Index (BMI) 40.0 and over, adult: Secondary | ICD-10-CM | POA: Diagnosis not present

## 2021-08-30 DIAGNOSIS — J4530 Mild persistent asthma, uncomplicated: Secondary | ICD-10-CM | POA: Diagnosis not present

## 2021-08-30 DIAGNOSIS — R7303 Prediabetes: Secondary | ICD-10-CM | POA: Diagnosis not present

## 2021-08-30 DIAGNOSIS — Z720 Tobacco use: Secondary | ICD-10-CM | POA: Diagnosis not present

## 2021-08-30 DIAGNOSIS — J301 Allergic rhinitis due to pollen: Secondary | ICD-10-CM | POA: Diagnosis not present

## 2021-08-30 DIAGNOSIS — I10 Essential (primary) hypertension: Secondary | ICD-10-CM | POA: Diagnosis not present

## 2021-09-04 ENCOUNTER — Ambulatory Visit: Payer: Medicare HMO

## 2021-09-04 DIAGNOSIS — E782 Mixed hyperlipidemia: Secondary | ICD-10-CM | POA: Diagnosis not present

## 2021-09-04 DIAGNOSIS — J4530 Mild persistent asthma, uncomplicated: Secondary | ICD-10-CM | POA: Diagnosis not present

## 2021-09-04 DIAGNOSIS — Z6841 Body Mass Index (BMI) 40.0 and over, adult: Secondary | ICD-10-CM | POA: Diagnosis not present

## 2021-09-04 DIAGNOSIS — Z720 Tobacco use: Secondary | ICD-10-CM | POA: Diagnosis not present

## 2021-09-04 DIAGNOSIS — J301 Allergic rhinitis due to pollen: Secondary | ICD-10-CM | POA: Diagnosis not present

## 2021-09-04 DIAGNOSIS — I10 Essential (primary) hypertension: Secondary | ICD-10-CM | POA: Diagnosis not present

## 2021-09-04 DIAGNOSIS — R7303 Prediabetes: Secondary | ICD-10-CM | POA: Diagnosis not present

## 2021-09-04 DIAGNOSIS — J101 Influenza due to other identified influenza virus with other respiratory manifestations: Secondary | ICD-10-CM | POA: Diagnosis not present
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
## 2021-09-25 DIAGNOSIS — J40 Bronchitis, not specified as acute or chronic: Secondary | ICD-10-CM | POA: Diagnosis not present

## 2021-10-03 ENCOUNTER — Other Ambulatory Visit: Payer: Self-pay

## 2021-10-03 NOTE — Patient Outreach (Signed)
[facility]) Care Management  10/03/2021  [name] Oct 18, 1985 [id number]   Telephone Assessment    Successful outreach call placed to patient. Spoke briefly as patient was tending to her small children. She reports she has has been doing fairly well. She had recent resp infection.bronchitis but states sxs resolved. She saw bariatric surgeon and got a good report. She denies any RN CM needs or concerns at this time.    Medications Reviewed Today     Reviewed by Hayden Pedro, RN (Registered Nurse) on 10/03/21 at [phone]  Med List Status: <None>   Medication Order Taking? Sig Documenting Provider Last Dose Status Informant  acetaminophen (TYLENOL) 500 MG tablet [id number] No Take 1,000 mg by mouth every 8 (eight) hours as needed for headache.  [provider] Taking Active Self  albuterol (PROVENTIL HFA;VENTOLIN HFA) 108 (90 BASE) MCG/ACT inhaler [id number] No Inhale 2 puffs into the lungs every 4 (four) hours as needed for wheezing or shortness of breath.  [provider] Taking Active Self  amLODipine (NORVASC) 10 MG tablet [id number] No Take 1 tablet (10 mg total) by mouth daily.  Patient not taking: Reported on 07/11/2021   Dorothy Spark, MD Not Taking Active Self  bismuth subsalicylate (PEPTO BISMOL) 262 MG/15ML suspension [id number]  Take 30 mLs by mouth every 6 (six) hours as needed. [provider]  Active Self  calcium carbonate (TUMS - DOSED IN MG ELEMENTAL CALCIUM) 500 MG chewable tablet [id number]  Chew 1 tablet by mouth 3 (three) times daily. [provider]  Active Self  carvedilol (COREG) 12.5 MG tablet [id number] No Take 1 tablet (12.5 mg total) by mouth 2 (two) times daily. Freada Bergeron, MD 06/05/2021 Active   cholecalciferol (VITAMIN D3) 25 MCG (1000 UT) tablet [id number] No Take 1,000 Units by mouth daily. [provider] Taking Active Self  furosemide (LASIX) 40 MG tablet [id number] No  Take 0.5 tablets (20 mg total) by mouth daily as needed for fluid or edema.  Patient not taking: Reported on 07/11/2021   Freada Bergeron, MD Not Taking Active   KRILL OIL PO [id number] No Take 1 capsule by mouth daily. [provider] Taking Active Self  lisinopril (ZESTRIL) 40 MG tablet [id number]  Take 1 tablet (40 mg total) by mouth daily. Freada Bergeron, MD  Active   Semaglutide, 1 MG/DOSE, (OZEMPIC, 1 MG/DOSE,) 4 MG/3ML SOPN [id number]  Inject 1 mg into the skin once a week. Freada Bergeron, MD  Active             Care Plan : Hypertension (Adult)  Updates made by Hayden Pedro, RN since 10/03/2021 12:00 AM     Problem: Hypertension (Hypertension)      Long-Range Goal: Hypertension Monitored-Patient will report monitoring BP at home over the next 90 days. Completed 10/03/2021  Start Date: 03/22/2021  Expected End Date: 10/06/2021  Recent Progress: On track  Priority: High  Note:     Notes:   03/22/2021 RN CM discussed s/s of worsening condition and when to seek medical attention. -RN CM educated patient on the importance of adhering to med regimen and assessed for any barriers.   -RN CM confirmed pt has action plan in place and knows when to seek medical attention. -RN CM reviewed proper BP monitoring technique and tracking.  05/23/21 RN CM reviewed BP monitoring and values RN CM reinforced low NA diet  07/11/21 RN CM reviewed BP monitoring and values RN CM assessed  for med adherence.  65/03/54--[id number] due to duplicate care plan and goals    Task: Identify and Monitor Blood Pressure Elevation Completed 10/03/2021  Note:   Care Management Activities:    - blood pressure trends reviewed    Notes:    Problem: Disease Progression (Hypertension)      Long-Range Goal: Disease Progression Prevented or Minimized Completed 10/03/2021  Start Date: 03/22/2021  Expected End Date: 10/06/2021  This Visit's Progress: On track  Recent  Progress: On track  Priority: High  Note:    Notes:   03/22/2021 RN CM discussed s/s of worsening condition and when to seek medical attention. -RN CM educated patient on the importance of adhering to med regimen and assessed for any barriers.   -RN CM confirmed pt has action plan in place and knows when to seek medical attention. -RN CM reviewed low salt diet. -RN CM reviewed proper BP monitoring technique and tracking.  05/23/21 RN CM assessed for any acute issues/sxs. RN CM confirmed MD f/u in place and POC in place.  07/11/21 RN CM assessed for any acute issues/sxs. RN CM confirmed MD f/u in place and POC in place. 70/01/74--BSWHQPRFF due to duplicate care plan and goals    Task: Alleviate Barriers to Hypertension Treatment Completed 10/03/2021  Note:   Care Management Activities:    - healthy diet promoted - patient response to treatment assessed    Notes:     Care Plan : RN Care Manager POC  Updates made by Hayden Pedro, RN since 10/03/2021 12:00 AM     Problem: Chronic Disease Mgmt of Chronic Condition-HTN   Priority: High     Long-Range Goal: Development of POC for Mgmt of Chronic Condition-HTN   Start Date: 10/03/2021  Expected End Date: 10/03/2022  Priority: High  Note:    Current Barriers:  Chronic Disease Management support and education needs related to HTN and obesity   RNCM Clinical Goal(s):  Patient will verbalize understanding of plan for management of HTN as evidenced by mgmt of chronic condition and BP reported within normal parameters attend all scheduled medical appointments:   as evidenced by completing post bariatric surgery follow up appts demonstrate Ongoing health management independence as evidenced by continued wgt loss and lowering of BMI continue to work with RN Care Manager to address care management and care coordination needs related to  HTN and obesity s/p wgt loss surgery as evidenced by adherence to CM Team  Scheduled appointments through collaboration with RN Care manager, provider, and care team.   Interventions: POC sent to PCP upon initial assessment, quarterly and with any changes in patient's conditions Inter-disciplinary care team collaboration (see longitudinal plan of care) Evaluation of current treatment plan related to  self management and patient's adherence to plan as established by provider   Obesity-s/p wgt loss surgery  (Status:  New goal.)  Long Term Goal Evaluation of current treatment plan related to  recent bariatric surgery ,  self-management and patient's adherence to plan as established by provider. Discussed plans with patient for ongoing care management follow up and provided patient with direct contact information for care management team Evaluation of current treatment plan related to bariatric surgery and patient's adherence to plan as established by provider Provided education to patient re: wgt mgmt, healthy eating/lifestyle habits  10/03/21-Patient reports wgt down to 267lbs. She was over 300lbs prior to surgery. Her diet has been advanced. She is tolerating solids in small amts.      Hypertension Interventions:  (  Status:  New goal.) Long Term Goal Last practice recorded BP readings:  BP Readings from Last 3 Encounters:  06/05/21 137/84  04/24/21 100/70  04/05/21 (!) 158/94  Most recent eGFR/CrCl: No results found for: EGFR  No components found for: CRCL  10/03/21-Patient continues to monitor BP in the home. She reported latest BP was 101/75. She has appt with cardiology to discus BP and med regimen s/p recent wgt loss surgery.   Reviewed medications with patient and discussed importance of compliance Counseled on the importance of exercise goals with target of 150 minutes per week Discussed plans with patient for ongoing care management follow up and provided patient with direct contact information for care management team  Patient Goals/Self-Care  Activities: Take all medications as prescribed Attend all scheduled provider appointments Call provider office for new concerns or questions  check blood pressure daily write blood pressure results in a log or diary keep a blood pressure log take medications for blood pressure exactly as prescribed begin an exercise program Complete post surgery nutrition classes  Follow Up Plan:  Telephone follow up appointment with care management team member scheduled for:  quarterly-within the month of March The patient has been provided with contact information for the care management team and has been advised to call with any health related questions or concerns.        Plan: RN CM discussed with patient next quarterly outreach within the month of March. Patient agrees to care plan and follow up.  Enzo Montgomery, RN,BSN,CCM [facility] Telephonic Care Management Coordinator Direct Phone: [phone] Toll Free: [phone] Fax: [phone]

## 2021-10-17 DIAGNOSIS — E782 Mixed hyperlipidemia: Secondary | ICD-10-CM | POA: Diagnosis not present

## 2021-10-17 DIAGNOSIS — J301 Allergic rhinitis due to pollen: Secondary | ICD-10-CM | POA: Diagnosis not present

## 2021-10-17 DIAGNOSIS — I10 Essential (primary) hypertension: Secondary | ICD-10-CM | POA: Diagnosis not present

## 2021-10-17 DIAGNOSIS — J4530 Mild persistent asthma, uncomplicated: Secondary | ICD-10-CM | POA: Diagnosis not present

## 2021-10-17 DIAGNOSIS — R197 Diarrhea, unspecified: Secondary | ICD-10-CM | POA: Diagnosis not present

## 2021-10-17 DIAGNOSIS — R7303 Prediabetes: Secondary | ICD-10-CM | POA: Diagnosis not present

## 2021-10-17 DIAGNOSIS — Z720 Tobacco use: Secondary | ICD-10-CM | POA: Diagnosis not present

## 2021-10-17 DIAGNOSIS — Z6841 Body Mass Index (BMI) 40.0 and over, adult: Secondary | ICD-10-CM | POA: Diagnosis not present

## 2021-11-27 DIAGNOSIS — Z713 Dietary counseling and surveillance: Secondary | ICD-10-CM | POA: Diagnosis not present

## 2021-11-28 DIAGNOSIS — J069 Acute upper respiratory infection, unspecified: Secondary | ICD-10-CM | POA: Diagnosis not present
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
## 2022-01-01 ENCOUNTER — Other Ambulatory Visit: Payer: Self-pay

## 2022-01-01 NOTE — Patient Outreach (Signed)
[facility]) Care Management ? ?01/01/2022 ? ?[place] ?Jul 09, 1986 ?[id number] ? ? ?Telephone Assessment ? ?Successful outreach call placed to patient. She reports she is doing fairly well. She has started working a part time job. She ,also remains busy taking care of her young children. She remains independent with ADLs/IADLs. Appetite good. Wgt continues to trend down. Denies any RN CM needs or concerns at this time.  ? ? ?Medications Reviewed Today   ? ? Reviewed by Hayden Pedro, RN (Registered Nurse) on 01/01/22 at 1044  Med List Status: <None>  ? ?Medication Order Taking? Sig Documenting Provider Last Dose Status Informant  ?acetaminophen (TYLENOL) 500 MG tablet [id number] No Take 1,000 mg by mouth every 8 (eight) hours as needed for headache.  [provider] Taking Active Self  ?albuterol (PROVENTIL HFA;VENTOLIN HFA) 108 (90 BASE) MCG/ACT inhaler [id number] No Inhale 2 puffs into the lungs every 4 (four) hours as needed for wheezing or shortness of breath.  [provider] Taking Active Self  ?amLODipine (NORVASC) 10 MG tablet [id number] No Take 1 tablet (10 mg total) by mouth daily.  ?Patient not taking: Reported on 07/11/2021  ? Dorothy Spark, MD Not Taking Active Self  ?bismuth subsalicylate (PEPTO BISMOL) 262 MG/15ML suspension [id number]  Take 30 mLs by mouth every 6 (six) hours as needed. [provider]  Active Self  ?calcium carbonate (TUMS - DOSED IN MG ELEMENTAL CALCIUM) 500 MG chewable tablet [id number]  Chew 1 tablet by mouth 3 (three) times daily. [provider]  Active Self  ?carvedilol (COREG) 12.5 MG tablet [id number] No Take 1 tablet (12.5 mg total) by mouth 2 (two) times daily. Freada Bergeron, MD 06/05/2021 Active   ?cholecalciferol (VITAMIN D3) 25 MCG (1000 UT) tablet [id number] No Take 1,000 Units by mouth daily. [provider] Taking Active Self  ?furosemide (LASIX) 40 MG tablet [id number] No Take 0.5  tablets (20 mg total) by mouth daily as needed for fluid or edema.  ?Patient not taking: Reported on 07/11/2021  ? Freada Bergeron, MD Not Taking Active   ?KRILL OIL PO [id number] No Take 1 capsule by mouth daily. [provider] Taking Active Self  ?lisinopril (ZESTRIL) 40 MG tablet [id number]  Take 1 tablet (40 mg total) by mouth daily. Freada Bergeron, MD  Active   ?Semaglutide, 1 MG/DOSE, (OZEMPIC, 1 MG/DOSE,) 4 MG/3ML SOPN [id number]  Inject 1 mg into the skin once a week. Freada Bergeron, MD  Active   ? ?  ?  ? ?  ?  ? ? ? ? ? ?  03/22/2021  ? 11:55 AM 05/23/2021  ?  9:54 AM 07/11/2021  ? 10:24 AM 10/03/2021  ?  9:26 AM 01/01/2022  ? 10:45 AM  ?Fall Risk  ?Falls in the past year? 0 0 0 0 0  ?Was there an injury with Fall? 0 0 0 0 0  ?Fall Risk Category Calculator 0 0 0 0 0  ?Fall Risk Category Low Low Low Low Low  ?Patient Fall Risk Level Low fall risk Low fall risk Low fall risk Low fall risk Low fall risk  ?Patient at Risk for Falls Due to Medication side effect Medication side effect Medication side effect Medication side effect Medication side effect  ?Fall risk Follow up Falls evaluation completed;Education provided Falls evaluation completed Falls evaluation completed Falls evaluation completed Falls evaluation completed  ?  ? ?  01/01/2022  ? 10:31 AM 03/22/2021  ? 11:56 AM 09/09/2017  ?  11:22 AM 07/09/2017  ? 11:19 AM 07/03/2017  ?  1:31 PM  ?Depression screen PHQ 2/9  ?Decreased Interest 0 0 1 0 0  ?Down, Depressed, Hopeless 0 0 '1 1 1  ' ?PHQ - 2 Score 0 0 '2 1 1  ' ?Altered sleeping   1    ?Tired, decreased energy   1    ?Change in appetite   1    ?Feeling bad or failure about yourself    1    ?Trouble concentrating   3    ?Moving slowly or fidgety/restless   3    ?Suicidal thoughts   0    ?PHQ-9 Score   12    ?Difficult doing work/chores   Somewhat difficult    ? ? ?SDOH Screenings  ? ?Alcohol Screen: Not on file  ?Depression (PHQ2-9): Low Risk   ? PHQ-2 Score: 0  ?Financial Resource Strain:  Not on file  ?Food Insecurity: No Food Insecurity  ? Worried About Charity fundraiser in the Last Year: Never true  ? Ran Out of Food in the Last Year: Never true  ?Housing: Not on file  ?Physical Activity: Not on file  ?Social Connections: Not on file  ?Stress: Not on file  ?Tobacco Use: Medium Risk  ? Smoking Tobacco Use: Former  ? Smokeless Tobacco Use: Never  ? Passive Exposure: Not on file  ?Transportation Needs: No Transportation Needs  ? Lack of Transportation (Medical): No  ? Lack of Transportation (Non-Medical): No  ?  ?Care Plan : RN Care Manager POC  ?Updates made by Hayden Pedro, RN since 01/01/2022 12:00 AM  ?  ? ?Problem: Chronic Disease Mgmt of Chronic Condition-HTN   ?Priority: High  ?  ? ?Long-Range Goal: Development of POC for Mgmt of Chronic Condition-HTN   ?Start Date: 10/03/2021  ?Expected End Date: 10/03/2022  ?This Visit's Progress: On track  ?Priority: High  ?Note:   ? ?Current Barriers:  ?Chronic Disease Management support and education needs related to HTN and obesity  ? ?RNCM Clinical Goal(s):  ?Patient will verbalize understanding of plan for management of HTN as evidenced by mgmt of chronic condition and BP reported within normal parameters ?attend all scheduled medical appointments:   as evidenced by completing post bariatric surgery follow up appts ?demonstrate Ongoing health management independence as evidenced by continued wgt loss and lowering of BMI ?continue to work with RN Care Manager to address care management and care coordination needs related to  HTN and obesity s/p wgt loss surgery as evidenced by adherence to CM Team Scheduled appointments through collaboration with RN Care manager, provider, and care team.  ? ?Interventions: ?POC sent to PCP upon initial assessment, quarterly and with any changes in patient's conditions ?Inter-disciplinary care team collaboration (see longitudinal plan of care) ?Evaluation of current treatment plan related to  self  management and patient's adherence to plan as established by provider ? ? ?Obesity-s/p wgt loss surgery  (Status:  Goal on track:  Yes.)  Long Term Goal ?Evaluation of current treatment plan related to  recent bariatric surgery ,  self-management and patient's adherence to plan as established by provider. ?Discussed plans with patient for ongoing care management follow up and provided patient with direct contact information for care management team ?Evaluation of current treatment plan related to bariatric surgery and patient's adherence to plan as established by provider ?Provided education to patient re: wgt mgmt, healthy eating/lifestyle habits ? ?10/03/21-Patient reports wgt down to 267lbs. She was over 300lbs  prior to surgery. Her diet has been advanced. She is tolerating solids in small amts.  ? ?01/01/22-Patient continue to report ongoing wgt loss. Wgt reported at 249lbs. She is followed by nutritionist and trying to eat healthier meals/snacks and make better food choices.  ? ? ? ?Hypertension Interventions:  (Status:  Goal on track:  Yes.) Long Term Goal ?Last practice recorded BP readings:  ?BP Readings from Last 3 Encounters:  ?06/05/21 137/84  ?04/24/21 100/70  ?04/05/21 (!) 158/94  ?Most recent eGFR/CrCl: No results found for: EGFR  No components found for: CRCL ? ?10/03/21-Patient continues to monitor BP in the home. She reported latest BP was 101/75. She has appt with cardiology to discus BP and med regimen s/p recent wgt loss surgery.  ? ?01/01/22-Patient reports since continued wgt loss-BP readings have been lower-mid to low 100s. Although, she admits to not consistently checking BP daily. She lost one of her BP meds. Advised to notify pharmacy/PCP office.  ? ?Reviewed medications with patient and discussed importance of compliance ?Counseled on the importance of exercise goals with target of 150 minutes per week ?Discussed plans with patient for ongoing care management follow up and provided patient  with direct contact information for care management team ? ?Patient Goals/Self-Care Activities: ?Take all medications as prescribed ?Attend all scheduled provider appointments ?Call provider office for new concer

## 2022-01-08 ENCOUNTER — Encounter: Payer: Self-pay | Admitting: Cardiology

## 2022-01-08 ENCOUNTER — Encounter: Payer: Self-pay | Admitting: *Deleted

## 2022-01-08 ENCOUNTER — Ambulatory Visit (INDEPENDENT_AMBULATORY_CARE_PROVIDER_SITE_OTHER): Payer: Medicare HMO | Admitting: Cardiology

## 2022-01-08 VITALS — BP 140/80 | HR 56 | Ht 65.0 in | Wt 284.0 lb

## 2022-01-08 DIAGNOSIS — I110 Hypertensive heart disease with heart failure: Secondary | ICD-10-CM

## 2022-01-08 DIAGNOSIS — O1495 Unspecified pre-eclampsia, complicating the puerperium: Secondary | ICD-10-CM | POA: Diagnosis not present

## 2022-01-08 DIAGNOSIS — R002 Palpitations: Secondary | ICD-10-CM | POA: Diagnosis not present

## 2022-01-08 DIAGNOSIS — Z713 Dietary counseling and surveillance: Secondary | ICD-10-CM | POA: Diagnosis not present

## 2022-01-08 DIAGNOSIS — I10 Essential (primary) hypertension: Secondary | ICD-10-CM | POA: Diagnosis not present

## 2022-01-08 MED ORDER — AMLODIPINE BESYLATE 5 MG PO TABS: 7.5000 mg | ORAL_TABLET | Freq: Every day | ORAL | 3 refills | Status: DC

## 2022-01-08 NOTE — Patient Instructions (Signed)
Medication Instructions:  ? ?DECREASE YOUR AMLODIPINE TO 7.5 MG BY MOUTH DAILY ? ?*If you need a refill on your cardiac medications before your next appointment, please call your pharmacy* ? ? ?Follow-Up: ?At [facility], you and your health needs are our priority.  As part of our continuing mission to provide you with exceptional heart care, we have created designated Provider Care Teams.  These Care Teams include your primary Cardiologist (physician) and Advanced Practice Providers (APPs -  Physician Assistants and Nurse Practitioners) who all work together to provide you with the care you need, when you need it. ? ?We recommend signing up for the patient portal called "MyChart".  Sign up information is provided on this After Visit Summary.  MyChart is used to connect with patients for Virtual Visits (Telemedicine).  Patients are able to view lab/test results, encounter notes, upcoming appointments, etc.  Non-urgent messages can be sent to your provider as well.   ?To learn more about what you can do with MyChart, go to [email or web address].   ? ?Your next appointment:   ?6 month(s) ? ?The format for your next appointment:   ?In Person ? ?Provider: ?EXTENDER ? ? ?Other Instructions ? ?PLEASE START TAKING AND LOGGING YOUR BLOOD PRESSURES AT HOME ? ?

## 2022-01-12 DIAGNOSIS — Z0100 Encounter for examination of eyes and vision without abnormal findings: Secondary | ICD-10-CM | POA: Diagnosis not present

## 2022-01-12 DIAGNOSIS — H5210 Myopia, unspecified eye: Secondary | ICD-10-CM | POA: Diagnosis not present

## 2022-01-18 DIAGNOSIS — E782 Mixed hyperlipidemia: Secondary | ICD-10-CM | POA: Diagnosis not present

## 2022-01-18 DIAGNOSIS — Z720 Tobacco use: Secondary | ICD-10-CM | POA: Diagnosis not present

## 2022-01-18 DIAGNOSIS — J4530 Mild persistent asthma, uncomplicated: Secondary | ICD-10-CM | POA: Diagnosis not present

## 2022-01-18 DIAGNOSIS — R7303 Prediabetes: Secondary | ICD-10-CM | POA: Diagnosis not present

## 2022-01-18 DIAGNOSIS — Z0001 Encounter for general adult medical examination with abnormal findings: Secondary | ICD-10-CM | POA: Diagnosis not present

## 2022-01-18 DIAGNOSIS — E559 Vitamin D deficiency, unspecified: Secondary | ICD-10-CM | POA: Diagnosis not present

## 2022-01-18 DIAGNOSIS — J301 Allergic rhinitis due to pollen: Secondary | ICD-10-CM | POA: Diagnosis not present

## 2022-01-18 DIAGNOSIS — I10 Essential (primary) hypertension: Secondary | ICD-10-CM | POA: Diagnosis not present

## 2022-02-06 DIAGNOSIS — E559 Vitamin D deficiency, unspecified: Secondary | ICD-10-CM | POA: Diagnosis not present

## 2022-02-06 DIAGNOSIS — R7303 Prediabetes: Secondary | ICD-10-CM | POA: Diagnosis not present

## 2022-02-06 DIAGNOSIS — Z720 Tobacco use: Secondary | ICD-10-CM | POA: Diagnosis not present

## 2022-02-06 DIAGNOSIS — J4530 Mild persistent asthma, uncomplicated: Secondary | ICD-10-CM | POA: Diagnosis not present

## 2022-02-06 DIAGNOSIS — I10 Essential (primary) hypertension: Secondary | ICD-10-CM | POA: Diagnosis not present

## 2022-02-06 DIAGNOSIS — E782 Mixed hyperlipidemia: Secondary | ICD-10-CM | POA: Diagnosis not present

## 2022-02-06 DIAGNOSIS — J301 Allergic rhinitis due to pollen: Secondary | ICD-10-CM | POA: Diagnosis not present

## 2022-02-06 DIAGNOSIS — Z6841 Body Mass Index (BMI) 40.0 and over, adult: Secondary | ICD-10-CM | POA: Diagnosis not present

## 2022-02-13 DIAGNOSIS — K912 Postsurgical malabsorption, not elsewhere classified: Secondary | ICD-10-CM | POA: Diagnosis not present

## 2022-02-13 DIAGNOSIS — Z9884 Bariatric surgery status: Secondary | ICD-10-CM | POA: Diagnosis not present

## 2022-03-13 DIAGNOSIS — I10 Essential (primary) hypertension: Secondary | ICD-10-CM | POA: Diagnosis not present

## 2022-03-13 DIAGNOSIS — E559 Vitamin D deficiency, unspecified: Secondary | ICD-10-CM | POA: Diagnosis not present

## 2022-03-13 DIAGNOSIS — K912 Postsurgical malabsorption, not elsewhere classified: Secondary | ICD-10-CM | POA: Diagnosis not present

## 2022-03-13 DIAGNOSIS — K219 Gastro-esophageal reflux disease without esophagitis: Secondary | ICD-10-CM | POA: Diagnosis not present

## 2022-03-13 DIAGNOSIS — Z6841 Body Mass Index (BMI) 40.0 and over, adult: Secondary | ICD-10-CM | POA: Diagnosis not present

## 2022-03-13 DIAGNOSIS — F909 Attention-deficit hyperactivity disorder, unspecified type: Secondary | ICD-10-CM | POA: Diagnosis not present

## 2022-03-13 DIAGNOSIS — F3130 Bipolar disorder, current episode depressed, mild or moderate severity, unspecified: Secondary | ICD-10-CM | POA: Diagnosis not present

## 2022-03-13 DIAGNOSIS — G4733 Obstructive sleep apnea (adult) (pediatric): Secondary | ICD-10-CM | POA: Diagnosis not present

## 2022-03-30 ENCOUNTER — Other Ambulatory Visit: Payer: Self-pay

## 2022-03-30 ENCOUNTER — Telehealth: Payer: Self-pay | Admitting: Pharmacist

## 2022-03-30 DIAGNOSIS — I10 Essential (primary) hypertension: Secondary | ICD-10-CM

## 2022-03-30 MED ORDER — LISINOPRIL 40 MG PO TABS: 40.0000 mg | ORAL_TABLET | Freq: Every day | ORAL | 3 refills | Status: DC

## 2022-03-30 MED ORDER — FUROSEMIDE 40 MG PO TABS: 20.0000 mg | ORAL_TABLET | Freq: Every day | ORAL | 3 refills | Status: DC | PRN

## 2022-03-30 MED ORDER — CARVEDILOL 12.5 MG PO TABS: 12.5000 mg | ORAL_TABLET | Freq: Two times a day (BID) | ORAL | 3 refills | Status: DC

## 2022-03-30 MED ORDER — OZEMPIC (0.25 OR 0.5 MG/DOSE) 2 MG/3ML ~~LOC~~ SOPN: PEN_INJECTOR | SUBCUTANEOUS | 1 refills | Status: DC

## 2022-03-30 NOTE — Telephone Encounter (Signed)
Pt's medication was sent to pt's pharmacy as requested. Confirmation received.  °

## 2022-03-30 NOTE — Telephone Encounter (Signed)
Received refill request for Ozempic. Per last phone note from 08/17/21, pt had stopped taking Ozempic after bariatric surgery, Ozempic is still on her medication list though. Called pt to clarify if she has been on Ozempic. States she has not, but she wishes to resume. Refill request was for the 1mg  dose, will deny this and refill back at starting dose of 0.25mg  weekly for the first month.

## 2022-04-02 NOTE — Telephone Encounter (Signed)
Pt called clinic back saying that the pharmacy reached out to [place] saying her rx needed a code for it to be filled. I do not see that our office has been contacted by her pharmacy. Called her pharmacy, they require ICD10 code for rx to be processed for Ozempic. Pt has hx of preDM, rx last year was available on formulary and did not require prior auth or dx code. Pharmacy stated that preDM ICD 10 code worked but that they need to order Ozempic since they're out of stock. Should not have issues processing rx now though.

## 2022-04-03 ENCOUNTER — Other Ambulatory Visit: Payer: Self-pay

## 2022-04-03 NOTE — Patient Outreach (Signed)
[facility]) Care Management  04/03/2022  [name] 1986-03-20 [id number]   Telephone Assessment    Call placed back to patient per her request and no answer.     Plan: RN CM will make outreach to attempt within the month of Aug.  [name] [name] [facility] Care Management Telephonic Care Management Coordinator Direct Phone: [phone] Toll Free: [phone] Fax: [phone]

## 2022-04-04 NOTE — Telephone Encounter (Signed)
*  STAT* If patient is at the pharmacy, call can be transferred to refill team.   1. Which medications need to be refilled? (please list name of each medication and dose if known)   Semaglutide,0.25 or 0.'5MG'$ /DOS, (OZEMPIC, 0.25 OR 0.5 MG/DOSE,) 2 MG/3ML SOPN  2. Which pharmacy/location (including street and city if local pharmacy) is medication to be sent to?  [place], [place]  3. Do they need a 30 day or 90 day supply?   90 day  Pharmacy state insurance is requiring an ICD Code before they will pay for this medication.

## 2022-04-05 NOTE — Telephone Encounter (Addendum)
I already spoke with the pharmacy on 6/26 and provided them with ICD10 code and was advised that rx processed just fine. Not sure why they are asking for the same thing again now.  Called pharmacy, now they're stating prior authorization is needed too. Will submit. Being used for preDM and weight loss.

## 2022-04-05 NOTE — Telephone Encounter (Signed)
Received denial for Ozempic since pt does not have dx of T2DM. Pt aware.

## 2022-04-05 NOTE — Addendum Note (Signed)
Addended by: Usiel Astarita E on: 04/05/2022 02:40 PM   Modules accepted: Orders

## 2022-04-06 ENCOUNTER — Other Ambulatory Visit: Payer: Self-pay

## 2022-04-06 ENCOUNTER — Encounter (HOSPITAL_COMMUNITY): Payer: Self-pay | Admitting: *Deleted

## 2022-04-06 ENCOUNTER — Ambulatory Visit (HOSPITAL_COMMUNITY)
Admission: EM | Admit: 2022-04-06 | Discharge: 2022-04-06 | Disposition: A | Discharge status: Home / Self Care | Payer: Medicare HMO | Attending: Student | Admitting: Student

## 2022-04-06 DIAGNOSIS — T7840XA Allergy, unspecified, initial encounter: Secondary | ICD-10-CM | POA: Diagnosis not present

## 2022-04-06 MED ORDER — METHYLPREDNISOLONE SODIUM SUCC 125 MG IJ SOLR
60.0000 mg | Freq: Once | INTRAMUSCULAR | Status: AC
  Administered 2022-04-06: 60 mg via INTRAMUSCULAR

## 2022-04-06 MED ORDER — METHYLPREDNISOLONE SODIUM SUCC 125 MG IJ SOLR
INTRAMUSCULAR | Status: AC
  Filled 2022-04-06: qty 2

## 2022-04-06 MED ORDER — HYDROXYZINE HCL 25 MG PO TABS: 25.0000 mg | ORAL_TABLET | Freq: Four times a day (QID) | ORAL | 0 refills | Status: DC

## 2022-04-06 NOTE — ED Notes (Addendum)
Pt reports she is feeling better.

## 2022-04-06 NOTE — ED Triage Notes (Signed)
Bee sting to Lt arm with swelling. Pt also reports her voice is hoarse. Pt reports she was stung this morning. No resp distress noted and pt is speaking in full sentences.
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
## 2022-04-06 NOTE — Discharge Instructions (Signed)
-  Hydroxyzine as needed for itching, up to every 6 hours. This medication will make you drowsy, so avoid before driving or operating machinery. Do not drink alcohol while taking this medication.  -Avoid other antihistamines that will make you drowsy while taking this medication, like benedryl.

## 2022-04-09 DIAGNOSIS — W57XXXS Bitten or stung by nonvenomous insect and other nonvenomous arthropods, sequela: Secondary | ICD-10-CM | POA: Diagnosis not present

## 2022-04-09 DIAGNOSIS — L03114 Cellulitis of left upper limb: Secondary | ICD-10-CM | POA: Diagnosis not present

## 2022-04-09 DIAGNOSIS — S50862S Insect bite (nonvenomous) of left forearm, sequela: Secondary | ICD-10-CM | POA: Diagnosis not present

## 2022-04-17 DIAGNOSIS — Z91030 Bee allergy status: Secondary | ICD-10-CM | POA: Diagnosis not present

## 2022-05-01 DIAGNOSIS — I10 Essential (primary) hypertension: Secondary | ICD-10-CM | POA: Diagnosis not present

## 2022-05-01 DIAGNOSIS — J4530 Mild persistent asthma, uncomplicated: Secondary | ICD-10-CM | POA: Diagnosis not present

## 2022-05-01 DIAGNOSIS — R7303 Prediabetes: Secondary | ICD-10-CM | POA: Diagnosis not present

## 2022-05-01 DIAGNOSIS — Z720 Tobacco use: Secondary | ICD-10-CM | POA: Diagnosis not present

## 2022-05-01 DIAGNOSIS — J301 Allergic rhinitis due to pollen: Secondary | ICD-10-CM | POA: Diagnosis not present

## 2022-05-01 DIAGNOSIS — E559 Vitamin D deficiency, unspecified: Secondary | ICD-10-CM | POA: Diagnosis not present

## 2022-05-01 DIAGNOSIS — E782 Mixed hyperlipidemia: Secondary | ICD-10-CM | POA: Diagnosis not present

## 2022-05-17 ENCOUNTER — Other Ambulatory Visit: Payer: Self-pay

## 2022-05-17 NOTE — Patient Outreach (Signed)
[facility]) Care Management  05/17/2022  [name] 1986-07-12 [id number]   Telephone Assessment     Successful outreach call to patient. She is doing well. Denies any acute issues or concerns. She di have to go to the ED in June for bad bee sting. Patient reports she now carries an Epi-Pen with her. She did not follow up with allergist as she voices she was busy and had a lot going on. She is aware that she can still call and make an appt. Patient reports she continues to gradually lose wgt. He is almost at a year mark from wgt loss surgery. She is trying to adhere to diet restrictions. Patient has maintained and done well-stable an no ongoing RN CM needs or concerns at this time.   Care Plan : RN Care Manager POC  Updates made by Hayden Pedro, RN since 05/17/2022 12:00 AM     Problem: Chronic Disease Mgmt of Chronic Condition-HTN   Priority: High     Long-Range Goal: Development of POC for Mgmt of Chronic Condition-HTN Completed 05/17/2022  Start Date: 10/03/2021  Expected End Date: 10/03/2022  This Visit's Progress: On track  Recent Progress: On track  Priority: High  Note:    Current Barriers:  Chronic Disease Management support and education needs related to HTN and obesity   RNCM Clinical Goal(s):  Patient will verbalize understanding of plan for management of HTN as evidenced by mgmt of chronic condition and BP reported within normal parameters attend all scheduled medical appointments:   as evidenced by completing post bariatric surgery follow up appts demonstrate Ongoing health management independence as evidenced by continued wgt loss and lowering of BMI continue to work with RN Care Manager to address care management and care coordination needs related to  HTN and obesity s/p wgt loss surgery as evidenced by adherence to CM Team Scheduled appointments through collaboration with RN Care manager, provider, and care team.    Interventions: POC sent to PCP upon initial assessment, quarterly and with any changes in patient's conditions Inter-disciplinary care team collaboration (see longitudinal plan of care) Evaluation of current treatment plan related to  self management and patient's adherence to plan as established by provider   Obesity-s/p wgt loss surgery  (Status:  Goal on track:  Yes.)  Long Term Goal Evaluation of current treatment plan related to  recent bariatric surgery ,  self-management and patient's adherence to plan as established by provider. Discussed plans with patient for ongoing care management follow up and provided patient with direct contact information for care management team Evaluation of current treatment plan related to bariatric surgery and patient's adherence to plan as established by provider Provided education to patient re: wgt mgmt, healthy eating/lifestyle habits  10/03/21-Patient reports wgt down to 267lbs. She was over 300lbs prior to surgery. Her diet has been advanced. She is tolerating solids in small amts.   01/01/22-Patient continue to report ongoing wgt loss. Wgt reported at 249lbs. She is followed by nutritionist and trying to eat healthier meals/snacks and make better food choices.     Hypertension Interventions:  (Status:  Goal on track:  Yes.) Long Term Goal Last practice recorded BP readings:  BP Readings from Last 3 Encounters:  06/05/21 137/84  04/24/21 100/70  04/05/21 (!) 158/94  Most recent eGFR/CrCl: No results found for: EGFR  No components found for: CRCL  10/03/21-Patient continues to monitor BP in the home. She reported latest BP was 101/75. She has appt with  cardiology to discus BP and med regimen s/p recent wgt loss surgery.   01/01/22-Patient reports since continued wgt loss-BP readings have been lower-mid to low 100s. Although, she admits to not consistently checking BP daily. She lost one of her BP meds. Advised to notify pharmacy/PCP office.    Reviewed medications with patient and discussed importance of compliance Counseled on the importance of exercise goals with target of 150 minutes per week Discussed plans with patient for ongoing care management follow up and provided patient with direct contact information for care management team  Patient Goals/Self-Care Activities: Take all medications as prescribed Attend all scheduled provider appointments Call provider office for new concerns or questions  check blood pressure daily write blood pressure results in a log or diary keep a blood pressure log take medications for blood pressure exactly as prescribed begin an exercise program Complete post surgery nutrition classes  Follow Up Plan:  Telephone follow up appointment with care management team member scheduled for:  quarterly-within the month of March The patient has been provided with contact information for the care management team and has been advised to call with any health related questions or concerns.        Plan: RN CM will close case.  Enzo Montgomery, RN,BSN,CCM [place] Management Telephonic Care Management Coordinator Direct Phone: [phone] Toll Free: [phone] Fax: [phone]

## 2022-05-29 DIAGNOSIS — B3731 Acute candidiasis of vulva and vagina: Secondary | ICD-10-CM | POA: Diagnosis not present

## 2022-05-29 DIAGNOSIS — E782 Mixed hyperlipidemia: Secondary | ICD-10-CM | POA: Diagnosis not present

## 2022-05-29 DIAGNOSIS — I10 Essential (primary) hypertension: Secondary | ICD-10-CM | POA: Diagnosis not present

## 2022-05-29 DIAGNOSIS — J4530 Mild persistent asthma, uncomplicated: Secondary | ICD-10-CM | POA: Diagnosis not present

## 2022-05-29 DIAGNOSIS — J301 Allergic rhinitis due to pollen: Secondary | ICD-10-CM | POA: Diagnosis not present

## 2022-05-29 DIAGNOSIS — F331 Major depressive disorder, recurrent, moderate: Secondary | ICD-10-CM | POA: Diagnosis not present

## 2022-05-29 DIAGNOSIS — Z720 Tobacco use: Secondary | ICD-10-CM | POA: Diagnosis not present

## 2022-05-29 DIAGNOSIS — E559 Vitamin D deficiency, unspecified: Secondary | ICD-10-CM | POA: Diagnosis not present

## 2022-05-29 DIAGNOSIS — R7303 Prediabetes: Secondary | ICD-10-CM | POA: Diagnosis not present

## 2022-06-12 DIAGNOSIS — J301 Allergic rhinitis due to pollen: Secondary | ICD-10-CM | POA: Diagnosis not present

## 2022-06-12 DIAGNOSIS — R609 Edema, unspecified: Secondary | ICD-10-CM | POA: Diagnosis not present

## 2022-06-12 DIAGNOSIS — J4530 Mild persistent asthma, uncomplicated: Secondary | ICD-10-CM | POA: Diagnosis not present

## 2022-06-12 DIAGNOSIS — E559 Vitamin D deficiency, unspecified: Secondary | ICD-10-CM | POA: Diagnosis not present

## 2022-06-12 DIAGNOSIS — Z720 Tobacco use: Secondary | ICD-10-CM | POA: Diagnosis not present

## 2022-06-12 DIAGNOSIS — F331 Major depressive disorder, recurrent, moderate: Secondary | ICD-10-CM | POA: Diagnosis not present

## 2022-06-12 DIAGNOSIS — I10 Essential (primary) hypertension: Secondary | ICD-10-CM | POA: Diagnosis not present

## 2022-06-12 DIAGNOSIS — E782 Mixed hyperlipidemia: Secondary | ICD-10-CM | POA: Diagnosis not present

## 2022-06-12 DIAGNOSIS — R7303 Prediabetes: Secondary | ICD-10-CM | POA: Diagnosis not present

## 2022-06-15 DIAGNOSIS — Z903 Acquired absence of stomach [part of]: Secondary | ICD-10-CM | POA: Diagnosis not present

## 2022-06-15 DIAGNOSIS — Z9884 Bariatric surgery status: Secondary | ICD-10-CM | POA: Diagnosis not present

## 2022-06-15 DIAGNOSIS — K912 Postsurgical malabsorption, not elsewhere classified: Secondary | ICD-10-CM | POA: Diagnosis not present

## 2022-06-22 DIAGNOSIS — N920 Excessive and frequent menstruation with regular cycle: Secondary | ICD-10-CM | POA: Diagnosis not present

## 2022-06-22 DIAGNOSIS — I10 Essential (primary) hypertension: Secondary | ICD-10-CM | POA: Diagnosis not present

## 2022-06-22 DIAGNOSIS — D500 Iron deficiency anemia secondary to blood loss (chronic): Secondary | ICD-10-CM | POA: Diagnosis not present

## 2022-06-22 DIAGNOSIS — E611 Iron deficiency: Secondary | ICD-10-CM | POA: Diagnosis not present

## 2022-06-22 DIAGNOSIS — Z9884 Bariatric surgery status: Secondary | ICD-10-CM | POA: Diagnosis not present

## 2022-06-26 DIAGNOSIS — I10 Essential (primary) hypertension: Secondary | ICD-10-CM | POA: Diagnosis not present

## 2022-06-26 DIAGNOSIS — R7303 Prediabetes: Secondary | ICD-10-CM | POA: Diagnosis not present

## 2022-06-26 DIAGNOSIS — E559 Vitamin D deficiency, unspecified: Secondary | ICD-10-CM | POA: Diagnosis not present

## 2022-06-26 DIAGNOSIS — R609 Edema, unspecified: Secondary | ICD-10-CM | POA: Diagnosis not present

## 2022-06-26 DIAGNOSIS — E782 Mixed hyperlipidemia: Secondary | ICD-10-CM | POA: Diagnosis not present

## 2022-06-26 DIAGNOSIS — J4530 Mild persistent asthma, uncomplicated: Secondary | ICD-10-CM | POA: Diagnosis not present

## 2022-06-26 DIAGNOSIS — F331 Major depressive disorder, recurrent, moderate: Secondary | ICD-10-CM | POA: Diagnosis not present

## 2022-06-26 DIAGNOSIS — Z720 Tobacco use: Secondary | ICD-10-CM | POA: Diagnosis not present

## 2022-06-26 DIAGNOSIS — J301 Allergic rhinitis due to pollen: Secondary | ICD-10-CM | POA: Diagnosis not present

## 2022-07-01 ENCOUNTER — Encounter (HOSPITAL_COMMUNITY): Payer: Self-pay

## 2022-07-01 ENCOUNTER — Ambulatory Visit (HOSPITAL_COMMUNITY)
Admission: EM | Admit: 2022-07-01 | Discharge: 2022-07-01 | Disposition: A | Discharge status: Home / Self Care | Payer: Medicare HMO | Attending: Internal Medicine | Admitting: Internal Medicine

## 2022-07-01 DIAGNOSIS — J0140 Acute pansinusitis, unspecified: Secondary | ICD-10-CM

## 2022-07-01 DIAGNOSIS — N898 Other specified noninflammatory disorders of vagina: Secondary | ICD-10-CM | POA: Diagnosis not present

## 2022-07-01 DIAGNOSIS — Z113 Encounter for screening for infections with a predominantly sexual mode of transmission: Secondary | ICD-10-CM | POA: Diagnosis not present

## 2022-07-01 LAB — POCT URINALYSIS DIPSTICK, ED / UC
Bilirubin Urine: NEGATIVE
Glucose, UA: NEGATIVE mg/dL
Ketones, ur: 15 mg/dL — AB
Leukocytes,Ua: NEGATIVE
Nitrite: NEGATIVE
Protein, ur: NEGATIVE mg/dL
Specific Gravity, Urine: 1.025 (ref 1.005–1.030)
Urobilinogen, UA: 0.2 mg/dL (ref 0.0–1.0)
pH: 6.5 (ref 5.0–8.0)

## 2022-07-01 LAB — HIV ANTIBODY (ROUTINE TESTING W REFLEX): HIV Screen 4th Generation wRfx: NONREACTIVE

## 2022-07-01 MED ORDER — FLUCONAZOLE 150 MG PO TABS: 150.0000 mg | ORAL_TABLET | ORAL | 0 refills | Status: DC

## 2022-07-01 MED ORDER — IBUPROFEN 800 MG PO TABS
ORAL_TABLET | ORAL | Status: AC
  Filled 2022-07-01: qty 1

## 2022-07-01 MED ORDER — ACETAMINOPHEN 325 MG PO TABS: 975.0000 mg | ORAL_TABLET | Freq: Once | ORAL | Status: DC

## 2022-07-01 MED ORDER — IBUPROFEN 800 MG PO TABS: 800.0000 mg | ORAL_TABLET | Freq: Once | ORAL | Status: DC

## 2022-07-01 MED ORDER — ACETAMINOPHEN 325 MG PO TABS
ORAL_TABLET | ORAL | Status: AC
  Filled 2022-07-01: qty 3

## 2022-07-01 MED ORDER — AMOXICILLIN-POT CLAVULANATE 875-125 MG PO TABS: 1.0000 | ORAL_TABLET | Freq: Two times a day (BID) | ORAL | 0 refills | Status: DC

## 2022-07-01 NOTE — ED Triage Notes (Signed)
Pt is here for vaginal itching , and sinus headache x1wk

## 2022-07-01 NOTE — Discharge Instructions (Addendum)
I am covering you for sinus infection.  Please start Augmentin twice daily for 1 week.  Use over-the-counter medications including cetirizine, Flonase, Mucinex, Tylenol.  If your symptoms are proving please return for reevaluation.  We gave you Tylenol in clinic.  Do not take ibuprofen for an additional 8 hours.  Alternate Tylenol ibuprofen for pain relief.  Follow-up with your primary care next week.  Take 1 dose of Diflucan to cover for yeast.  You can take a second dose in 1 week if you have recurrent/worsening symptoms.  We will contact you with your testing if we need to arrange any additional treatment.  Wear loosefitting cotton underwear and use hypoallergenic soaps and detergents.  Abstain from sex until you receive results.  You should use condoms each sexual encounter.  If develop any pelvic pain, fever, nausea, vomiting, you need to be seen immediately.
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
## 2022-07-02 ENCOUNTER — Emergency Department (HOSPITAL_COMMUNITY)
Admission: EM | Admit: 2022-07-02 | Discharge: 2022-07-02 | Discharge status: Against Medical Advice | Payer: Medicare HMO | Attending: Emergency Medicine | Admitting: Emergency Medicine

## 2022-07-02 ENCOUNTER — Encounter (HOSPITAL_COMMUNITY): Payer: Self-pay

## 2022-07-02 ENCOUNTER — Other Ambulatory Visit: Payer: Self-pay

## 2022-07-02 DIAGNOSIS — R2233 Localized swelling, mass and lump, upper limb, bilateral: Secondary | ICD-10-CM | POA: Insufficient documentation

## 2022-07-02 DIAGNOSIS — D500 Iron deficiency anemia secondary to blood loss (chronic): Secondary | ICD-10-CM | POA: Diagnosis not present

## 2022-07-02 DIAGNOSIS — R2243 Localized swelling, mass and lump, lower limb, bilateral: Secondary | ICD-10-CM | POA: Diagnosis not present

## 2022-07-02 DIAGNOSIS — Z5321 Procedure and treatment not carried out due to patient leaving prior to being seen by health care provider: Secondary | ICD-10-CM | POA: Insufficient documentation

## 2022-07-02 LAB — CERVICOVAGINAL ANCILLARY ONLY
Bacterial Vaginitis (gardnerella): POSITIVE — AB
Candida Glabrata: NEGATIVE
Candida Vaginitis: NEGATIVE
Chlamydia: NEGATIVE
Comment: NEGATIVE
Comment: NEGATIVE
Comment: NEGATIVE
Comment: NEGATIVE
Comment: NEGATIVE
Comment: NORMAL
Neisseria Gonorrhea: NEGATIVE
Trichomonas: NEGATIVE

## 2022-07-02 LAB — RPR: RPR Ser Ql: NONREACTIVE

## 2022-07-02 NOTE — ED Notes (Signed)
MSE not signed b/c pt states that her hands are swollen. Verbally agreed.

## 2022-07-02 NOTE — ED Notes (Signed)
Patient called multiple times and no answer

## 2022-07-02 NOTE — ED Triage Notes (Signed)
Patient reports she had an iron infusion and on the way home became hoarse and all extremities are swollen.  Denies trouble swallowing or breathing and took zyrtec prior to coming.

## 2022-07-02 NOTE — ED Provider Triage Note (Signed)
Emergency Medicine Provider Triage Evaluation Note  EMUNAH TEXIDOR , a 36 y.o. female  was evaluated in triage.  Pt complains of possible allergic reaction.  Had an infusion earlier today.  Noted afterwards she began to have hand and foot swelling.  No sensation of throat closing, rash, shortness of breath.  Took cetirizine prior to arrival.  Review of Systems  Positive: edema Negative: SOB, sensation of throat closing, rash  Physical Exam  BP 104/80 (BP Location: Right Arm)   Pulse 80   Temp 98.6 F (37 C) (Oral)   Resp 18   Ht '5\' 5"'$  (1.651 m)   Wt 128.8 kg   SpO2 100%   BMI 47.26 kg/m  Gen:   Awake, no distress   Mouth:  PO clear Resp:  Normal effort  MSK:   Moves extremities without difficulty  Other:  No rash  Medical Decision Making  Medically screening exam initiated at 3:39 PM.  Appropriate orders placed.  Quinlee L Ashby was informed that the remainder of the evaluation will be completed by another provider, this initial triage assessment does not replace that evaluation, and the importance of remaining in the ED until their evaluation is complete.  Allergic reaction   Kimisha Eunice A, PA-C 07/02/22 1540

## 2022-07-03 ENCOUNTER — Telehealth (HOSPITAL_COMMUNITY): Payer: Self-pay

## 2022-07-03 MED ORDER — METRONIDAZOLE 500 MG PO TABS: 500.0000 mg | ORAL_TABLET | Freq: Two times a day (BID) | ORAL | 0 refills | Status: DC

## 2022-07-09 DIAGNOSIS — D500 Iron deficiency anemia secondary to blood loss (chronic): Secondary | ICD-10-CM | POA: Diagnosis not present

## 2022-07-16 DIAGNOSIS — D500 Iron deficiency anemia secondary to blood loss (chronic): Secondary | ICD-10-CM | POA: Diagnosis not present

## 2022-07-20 ENCOUNTER — Ambulatory Visit: Payer: Medicare HMO | Attending: Physician Assistant | Admitting: Physician Assistant

## 2022-07-20 ENCOUNTER — Encounter: Payer: Self-pay | Admitting: Physician Assistant

## 2022-07-20 VITALS — BP 134/70 | HR 68 | Ht 65.0 in | Wt 234.0 lb

## 2022-07-20 DIAGNOSIS — Z79899 Other long term (current) drug therapy: Secondary | ICD-10-CM

## 2022-07-20 DIAGNOSIS — I110 Hypertensive heart disease with heart failure: Secondary | ICD-10-CM

## 2022-07-20 DIAGNOSIS — I10 Essential (primary) hypertension: Secondary | ICD-10-CM
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
## 2022-07-20 NOTE — Patient Instructions (Signed)
Medication Instructions:  Your physician recommends that you continue on your current medications as directed. Please refer to the Current Medication list given to you today. *If you need a refill on your cardiac medications before your next appointment, please call your pharmacy*   Lab Work: TODAY-BMET If you have labs (blood work) drawn today and your tests are completely normal, you will receive your results only by: [place] (if you have MyChart) OR A paper copy in the mail If you have any lab test that is abnormal or we need to change your treatment, we will call you to review the results.   Testing/Procedures: NONE ORDERED   Follow-Up: At [facility], you and your health needs are our priority.  As part of our continuing mission to provide you with exceptional heart care, we have created designated Provider Care Teams.  These Care Teams include your primary Cardiologist (physician) and Advanced Practice Providers (APPs -  Physician Assistants and Nurse Practitioners) who all work together to provide you with the care you need, when you need it.  We recommend signing up for the patient portal called "MyChart".  Sign up information is provided on this After Visit Summary.  MyChart is used to connect with patients for Virtual Visits (Telemedicine).  Patients are able to view lab/test results, encounter notes, upcoming appointments, etc.  Non-urgent messages can be sent to your provider as well.   To learn more about what you can do with MyChart, go to [email or web address].    Your next appointment:   12 month(s)  The format for your next appointment:   In Person  Provider:   Freada Bergeron, MD     Other Instructions   Important Information About Sugar

## 2022-07-21 LAB — BASIC METABOLIC PANEL
BUN/Creatinine Ratio: 17 (ref 9–23)
BUN: 13 mg/dL (ref 6–20)
CO2: 24 mmol/L (ref 20–29)
Calcium: 9.2 mg/dL (ref 8.7–10.2)
Chloride: 104 mmol/L (ref 96–106)
Creatinine, Ser: 0.77 mg/dL (ref 0.57–1.00)
Glucose: 89 mg/dL (ref 70–99)
Potassium: 4.2 mmol/L (ref 3.5–5.2)
Sodium: 139 mmol/L (ref 134–144)
eGFR: 104 mL/min/{1.73_m2} (ref >59)

## 2022-07-27 DIAGNOSIS — F411 Generalized anxiety disorder: Secondary | ICD-10-CM | POA: Diagnosis not present

## 2022-07-27 DIAGNOSIS — Z79899 Other long term (current) drug therapy: Secondary | ICD-10-CM | POA: Diagnosis not present

## 2022-07-27 DIAGNOSIS — F3181 Bipolar II disorder: Secondary | ICD-10-CM | POA: Diagnosis not present

## 2022-08-09 DIAGNOSIS — D649 Anemia, unspecified: Secondary | ICD-10-CM | POA: Diagnosis not present

## 2022-08-09 DIAGNOSIS — N939 Abnormal uterine and vaginal bleeding, unspecified: Secondary | ICD-10-CM | POA: Diagnosis not present

## 2022-08-27 DIAGNOSIS — F411 Generalized anxiety disorder: Secondary | ICD-10-CM | POA: Diagnosis not present

## 2022-08-27 DIAGNOSIS — F3181 Bipolar II disorder: Secondary | ICD-10-CM | POA: Diagnosis not present

## 2022-09-19 DIAGNOSIS — D500 Iron deficiency anemia secondary to blood loss (chronic): Secondary | ICD-10-CM | POA: Diagnosis not present

## 2022-09-19 DIAGNOSIS — F331 Major depressive disorder, recurrent, moderate: Secondary | ICD-10-CM | POA: Diagnosis not present

## 2022-09-19 DIAGNOSIS — N939 Abnormal uterine and vaginal bleeding, unspecified: Secondary | ICD-10-CM | POA: Diagnosis not present

## 2022-09-19 DIAGNOSIS — N920 Excessive and frequent menstruation with regular cycle: Secondary | ICD-10-CM | POA: Diagnosis not present

## 2022-09-19 DIAGNOSIS — F411 Generalized anxiety disorder: Secondary | ICD-10-CM | POA: Diagnosis not present

## 2022-10-18 DIAGNOSIS — Z720 Tobacco use: Secondary | ICD-10-CM | POA: Diagnosis not present

## 2022-10-18 DIAGNOSIS — F3181 Bipolar II disorder: Secondary | ICD-10-CM | POA: Diagnosis not present

## 2022-10-18 DIAGNOSIS — I10 Essential (primary) hypertension: Secondary | ICD-10-CM | POA: Diagnosis not present

## 2022-10-18 DIAGNOSIS — E559 Vitamin D deficiency, unspecified: Secondary | ICD-10-CM | POA: Diagnosis not present

## 2022-10-18 DIAGNOSIS — F331 Major depressive disorder, recurrent, moderate: Secondary | ICD-10-CM | POA: Diagnosis not present

## 2022-10-18 DIAGNOSIS — E782 Mixed hyperlipidemia: Secondary | ICD-10-CM | POA: Diagnosis not present

## 2022-10-18 DIAGNOSIS — D509 Iron deficiency anemia, unspecified: Secondary | ICD-10-CM | POA: Diagnosis not present

## 2022-10-18 DIAGNOSIS — J4530 Mild persistent asthma, uncomplicated: Secondary | ICD-10-CM | POA: Diagnosis not present

## 2022-10-18 DIAGNOSIS — J301 Allergic rhinitis due to pollen: Secondary | ICD-10-CM | POA: Diagnosis not present

## 2022-10-18 DIAGNOSIS — Z113 Encounter for screening for infections with a predominantly sexual mode of transmission: Secondary | ICD-10-CM | POA: Diagnosis not present

## 2022-10-18 DIAGNOSIS — R7303 Prediabetes: Secondary | ICD-10-CM | POA: Diagnosis not present

## 2022-10-18 DIAGNOSIS — Z0001 Encounter for general adult medical examination with abnormal findings: Secondary | ICD-10-CM | POA: Diagnosis not present

## 2022-10-18 DIAGNOSIS — F411 Generalized anxiety disorder: Secondary | ICD-10-CM | POA: Diagnosis not present

## 2022-10-23 DIAGNOSIS — N920 Excessive and frequent menstruation with regular cycle: Secondary | ICD-10-CM | POA: Diagnosis not present

## 2022-10-23 DIAGNOSIS — Z9884 Bariatric surgery status: Secondary | ICD-10-CM | POA: Diagnosis not present

## 2022-10-23 DIAGNOSIS — D500 Iron deficiency anemia secondary to blood loss (chronic): Secondary | ICD-10-CM | POA: Diagnosis not present

## 2022-10-30 ENCOUNTER — Telehealth: Payer: Self-pay | Admitting: Cardiology

## 2022-10-30 NOTE — Telephone Encounter (Signed)
Patient c/o Palpitations:  High priority if patient c/o lightheadedness, shortness of breath, or chest pain  How long have you had palpitations/irregular HR/ Afib? Are you having the symptoms now?   No  Are you currently experiencing lightheadedness, SOB or CP?  Lightheadedness, a little CP  Do you have a history of afib (atrial fibrillation) or irregular heart rhythm?   Yes  Have you checked your BP or HR? (document readings if available):   No  Are you experiencing any other symptoms?    Patient stated this episode started about 4 days ago and she is concerned as she has had low iron and she has a new blood clot in her eye.

## 2022-10-30 NOTE — Telephone Encounter (Signed)
Pt is calling to schedule an appt with an APP or Dr. Johney Frame for sometime this week.   Pt states over the last several months she has been having intermittent palpitations and chest discomfort.   She said she is also dizzy and lightheaded at times when the palpitations occur.  She voiced no syncopal episodes, sob, DOE, or orthopnea.  She denies lower extremity edema at this time. She denied feeling her heart racing or tachycardia.  Pt states she is receiving blood transfusions from the cancer center for iron deficiency anemia, since Oct of last year.  She states she also takes oral iron supplementation as well.    Pt states her issues have been ongoing since she was diagnosed with anemia this past Oct.   Pt states "I also have a blood clot in my right eye and now I have to wear glasses for this."  Pt is not monitoring her BP/HR at home, but she reports at her last visit with the cancer center her pressures were in the high 16'X systolic and rates stayed in the 60s.  Pt states this past Saturday night she went out to a bar for her birthday and drank a lot of alcohol, and symptoms have worsened since then.  Scheduled the pt to come into the office to see Richardson Dopp PA-C for tomorrow 1/24 at 0915.  She is aware to arrive 15 mins prior to this visit.   Advised the pt to increase her po hydration, take all her meds including her iron pills, change positions slowly to promote good circulation.  Advised her to start back wearing her compression stockings during the day.  Advised her to avoid alcohol and high salt intake.   ED precautions reviewed with the pt.   Pt aware that I will forward this plan to Dr. Johney Frame to make her aware.   Pt verbalized understanding and agrees with this plan.
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
## 2022-10-31 ENCOUNTER — Ambulatory Visit: Payer: Medicare HMO | Attending: Physician Assistant | Admitting: Cardiology

## 2022-10-31 ENCOUNTER — Encounter: Payer: Self-pay | Admitting: Cardiology

## 2022-10-31 ENCOUNTER — Ambulatory Visit (INDEPENDENT_AMBULATORY_CARE_PROVIDER_SITE_OTHER): Payer: Medicare HMO

## 2022-10-31 VITALS — BP 114/80 | HR 69 | Ht 65.0 in | Wt 225.6 lb

## 2022-10-31 DIAGNOSIS — R002 Palpitations: Secondary | ICD-10-CM | POA: Diagnosis not present

## 2022-10-31 DIAGNOSIS — I110 Hypertensive heart disease with heart failure: Secondary | ICD-10-CM

## 2022-10-31 DIAGNOSIS — I10 Essential (primary) hypertension: Secondary | ICD-10-CM | POA: Diagnosis not present

## 2022-10-31 DIAGNOSIS — R42 Dizziness and giddiness: Secondary | ICD-10-CM

## 2022-10-31 NOTE — Patient Instructions (Signed)
Medication Instructions:  Your physician recommends that you continue on your current medications as directed. Please refer to the Current Medication list given to you today.  *If you need a refill on your cardiac medications before your next appointment, please call your pharmacy*   Lab Work: TODAY:  TSH, MAG, & BMET  If you have labs (blood work) drawn today and your tests are completely normal, you will receive your results only by: [place] (if you have MyChart) OR A paper copy in the mail If you have any lab test that is abnormal or we need to change your treatment, we will call you to review the results.   Testing/Procedures: Bryn Gulling- Long Term Monitor Instructions  Your physician has requested you wear a ZIO patch monitor for 14 days.  This is a single patch monitor. [employer] supplies one patch monitor per enrollment. Additional stickers are not available. Please do not apply patch if you will be having a Nuclear Stress Test,  Echocardiogram, Cardiac CT, MRI, or Chest Xray during the period you would be wearing the  monitor. The patch cannot be worn during these tests. You cannot remove and re-apply the  ZIO XT patch monitor.  Your ZIO patch monitor will be mailed 3 day USPS to your address on file. It may take 3-5 days  to receive your monitor after you have been enrolled.  Once you have received your monitor, please review the enclosed instructions. Your monitor  has already been registered assigning a specific monitor serial # to you.  Billing and Patient Assistance Program Information  We have supplied [employer] with any of your insurance information on file for billing purposes. [employer] offers a sliding scale Patient Assistance Program for patients that do not have  insurance, or whose insurance does not completely cover the cost of the ZIO monitor.  You must apply for the Patient Assistance Program to qualify for this discounted rate.  To apply, please call [employer]  at [phone], select option 4, select option 2, ask to apply for  Patient Assistance Program. Theodore Demark will ask your household income, and how many people  are in your household. They will quote your out-of-pocket cost based on that information.  [employer] will also be able to set up a 17-month interest-free payment plan if needed.  Applying the monitor   Shave hair from upper left chest.  Hold abrader disc by orange tab. Rub abrader in 40 strokes over the upper left chest as  indicated in your monitor instructions.  Clean area with 4 enclosed alcohol pads. Let dry.  Apply patch as indicated in monitor instructions. Patch will be placed under collarbone on left  side of chest with arrow pointing upward.  Rub patch adhesive wings for 2 minutes. Remove white label marked "1". Remove the white  label marked "2". Rub patch adhesive wings for 2 additional minutes.  While looking in a mirror, press and release button in center of patch. A small green light will  flash 3-4 times. This will be your only indicator that the monitor has been turned on.  Do not shower for the first 24 hours. You may shower after the first 24 hours.  Press the button if you feel a symptom. You will hear a small click. Record Date, Time and  Symptom in the Patient Logbook.  When you are ready to remove the patch, follow instructions on the last 2 pages of Patient  Logbook. Stick patch monitor onto the last page of Patient  Logbook.  Place Patient Logbook in the blue and white box. Use locking tab on box and tape box closed  securely. The blue and white box has prepaid postage on it. Please place it in the mailbox as  soon as possible. Your physician should have your test results approximately 7 days after the  monitor has been mailed back to [facility].  Call [place] at [phone] if you have questions regarding  your ZIO XT patch monitor. Call them immediately if you see an orange light  blinking on your  monitor.  If your monitor falls off in less than 4 days, contact our Monitor department at [phone].  If your monitor becomes loose or falls off after 4 days call [employer] at [phone] for  suggestions on securing your monitor    Follow-Up: At [facility], you and your health needs are our priority.  As part of our continuing mission to provide you with exceptional heart care, we have created designated Provider Care Teams.  These Care Teams include your primary Cardiologist (physician) and Advanced Practice Providers (APPs -  Physician Assistants and Nurse Practitioners) who all work together to provide you with the care you need, when you need it.  We recommend signing up for the patient portal called "MyChart".  Sign up information is provided on this After Visit Summary.  MyChart is used to connect with patients for Virtual Visits (Telemedicine).  Patients are able to view lab/test results, encounter notes, upcoming appointments, etc.  Non-urgent messages can be sent to your provider as well.   To learn more about what you can do with MyChart, go to [email or web address].    Your next appointment:   3 month(s)  Provider:   Freada Bergeron, MD  or Richardson Dopp, PA-C         Other Instructions

## 2022-10-31 NOTE — Progress Notes (Unsigned)
Enrolled patient for a 14 day Zio XT monitor to be mailed to patients home   Dr Johney Frame to read

## 2022-11-01 LAB — BASIC METABOLIC PANEL WITH GFR
BUN/Creatinine Ratio: 21 (ref 9–23)
BUN: 15 mg/dL (ref 6–20)
CO2: 26 mmol/L (ref 20–29)
Calcium: 9.2 mg/dL (ref 8.7–10.2)
Chloride: 104 mmol/L (ref 96–106)
Creatinine, Ser: 0.73 mg/dL (ref 0.57–1.00)
Glucose: 94 mg/dL (ref 70–99)
Potassium: 4.2 mmol/L (ref 3.5–5.2)
Sodium: 140 mmol/L (ref 134–144)
eGFR: 111 mL/min/1.73

## 2022-11-01 LAB — MAGNESIUM: Magnesium: 2 mg/dL (ref 1.6–2.3)

## 2022-11-01 LAB — TSH: TSH: 1.69 u[IU]/mL (ref 0.450–4.500)

## 2022-11-02 DIAGNOSIS — I10 Essential (primary) hypertension: Secondary | ICD-10-CM | POA: Diagnosis not present

## 2022-11-02 DIAGNOSIS — R002 Palpitations: Secondary | ICD-10-CM | POA: Diagnosis not present

## 2022-11-02 DIAGNOSIS — I110 Hypertensive heart disease with heart failure: Secondary | ICD-10-CM | POA: Diagnosis not present

## 2022-11-23 DIAGNOSIS — F331 Major depressive disorder, recurrent, moderate: Secondary | ICD-10-CM | POA: Diagnosis not present

## 2022-11-23 DIAGNOSIS — F411 Generalized anxiety disorder: Secondary | ICD-10-CM | POA: Diagnosis not present

## 2022-11-23 DIAGNOSIS — F3181 Bipolar II disorder: Secondary | ICD-10-CM | POA: Diagnosis not present

## 2022-11-26 DIAGNOSIS — Z20828 Contact with and (suspected) exposure to other viral communicable diseases: Secondary | ICD-10-CM | POA: Diagnosis not present

## 2022-11-26 DIAGNOSIS — Z803 Family history of malignant neoplasm of breast: Secondary | ICD-10-CM | POA: Diagnosis not present

## 2022-11-26 DIAGNOSIS — N644 Mastodynia: Secondary | ICD-10-CM | POA: Diagnosis not present

## 2022-11-27 DIAGNOSIS — F3181 Bipolar II disorder: Secondary | ICD-10-CM | POA: Diagnosis not present

## 2022-11-27 DIAGNOSIS — F411 Generalized anxiety disorder: Secondary | ICD-10-CM | POA: Diagnosis not present

## 2022-11-29 DIAGNOSIS — R002 Palpitations: Secondary | ICD-10-CM | POA: Diagnosis not present

## 2022-11-29 DIAGNOSIS — I110 Hypertensive heart disease with heart failure: Secondary | ICD-10-CM | POA: Diagnosis not present

## 2022-12-03 ENCOUNTER — Ambulatory Visit: Payer: Self-pay | Admitting: Licensed Clinical Social Worker

## 2022-12-03 NOTE — Patient Outreach (Signed)
  Care Coordination   Initial Visit Note   12/03/2022 Name: [name] MRN: [id number] DOB: [birth year]  Golda Acre Lanzillo is a 37 y.o. year old female who sees Osei-Bonsu, Iona Beard, MD for primary care. I spoke with  [name] by phone today.  What matters to the patients health and wellness today?  Client is concerned about housing location and about housing safety.     Goals Addressed             This Visit's Progress    Patient is concerned about housing location and housing safety. She has stress related to transport needs and food needs       Intervention: LCSW spoke via phone with client today about program support with RN, LCSW and Pharmacist Client spoke of transport needs. She drives her car to nearby appointments. She feels that car is not very reliable for transport help. LCSW talked with client about SCAT support Discussed medication procurement. Discussed sleeping issues. She said she does not sleep well Discussed medical appointments. She has appointment with PCP in April of 2024. She said she receives iron infusions. She said sometimes she gets fatigued. Discussed food issues. She said she receives food stamps and that this is helpful. She gets [employer] benefit and this is helpful. She said she has 3 children who reside with her. Provided counseling support for client Discussed RN support through Care Coordination program. Reviewed ambulation of client Encouraged Sanaia Pinilla to call LCSW as needed for SW support at [phone].     SDOH assessments and interventions completed:  Yes  SDOH Interventions Today    Flowsheet Row Most Recent Value  SDOH Interventions   Transportation Interventions Other (Comment)  [client has older vehicle and she is afraid vehicle is not dependable]  Depression Interventions/Treatment  Counseling, Medication  Stress Interventions Provide Counseling  [stress related to transport needs,  stress related to housing  needs,  stress related to managing medical needs]        Care Coordination Interventions:  Yes, provided   Interventions Today    Flowsheet Row Most Recent Value  Chronic Disease   Chronic disease during today's visit Other  [discussed program support with RN, LCSW, Pharmacy]  General Interventions   General Interventions Discussed/Reviewed General Interventions Discussed, Community Resources  Education Interventions   Education Provided Provided Education  Provided Verbal Education On [employer]  Mental Health Interventions   Mental Health Discussed/Reviewed Anxiety, Coping Strategies  [client has stress related to anxiety issues and coping issues,  transport needs, financial needs, food needs]  Safety Interventions   Safety Discussed/Reviewed Safety Discussed        Follow up plan: Follow up call scheduled for 12/24/22 at 11;00 PM    Encounter Outcome:  Pt. Visit Completed   Norva Riffle.Caius Silbernagel MSW, [place] Holiday representative [facility] Care Management [phone]

## 2022-12-03 NOTE — Patient Instructions (Signed)
Visit Information  Thank you for taking time to visit with me today. Please don't hesitate to contact me if I can be of assistance to you before our next scheduled telephone appointment.  Following are the goals we discussed today:    Our next appointment is by telephone on 12/24/22 at 11:00 AM   Please call the care guide team at [phone] if you need to cancel or reschedule your appointment.   If you are experiencing a Mental Health or [place] or need someone to talk to, please go to [facility] [place] [phone])   Following is a copy of your full plan of care:   Intervention: LCSW spoke via phone with client today about program support with RN, LCSW and Pharmacist Client spoke of transport needs. She drives her car to nearby appointments. She feels that car is not very reliable for transport help. LCSW talked with client about SCAT support Discussed medication procurement. Discussed sleeping issues. She said she does not sleep well Discussed medical appointments. She has appointment with PCP in April of 2024. She said she receives iron infusions. She said sometimes she gets fatigued. Discussed food issues. She said she receives food stamps and that this is helpful. She gets [employer] benefit and this is helpful. She said she has 3 children who reside with her. Provided counseling support for client Discussed RN support through Care Coordination program. Reviewed ambulation of client Encouraged [name] to call LCSW as needed for SW support at [phone].  Ms. Stclair was given information about Care Management services by the embedded care coordination team including:  Care Management services include personalized support from designated clinical staff supervised by her physician, including individualized plan of care and coordination with other care providers 24/7 contact phone numbers for  assistance for urgent and routine care needs. The patient may stop CCM services at any time (effective at the end of the month) by phone call to the office staff.  Patient agreed to services and verbal consent obtained.   [name].[name] MSW, [place] Holiday representative [facility] Care Management [phone]

## 2022-12-04 DIAGNOSIS — F411 Generalized anxiety disorder: Secondary | ICD-10-CM | POA: Diagnosis not present

## 2022-12-04 DIAGNOSIS — F331 Major depressive disorder, recurrent, moderate: Secondary | ICD-10-CM | POA: Diagnosis not present

## 2022-12-04 DIAGNOSIS — F3181 Bipolar II disorder: Secondary | ICD-10-CM | POA: Diagnosis not present

## 2022-12-14 DIAGNOSIS — N920 Excessive and frequent menstruation with regular cycle: Secondary | ICD-10-CM | POA: Diagnosis not present

## 2022-12-14 DIAGNOSIS — D500 Iron deficiency anemia secondary to blood loss (chronic): Secondary | ICD-10-CM | POA: Diagnosis not present

## 2022-12-24 ENCOUNTER — Ambulatory Visit: Payer: Self-pay | Admitting: Licensed Clinical Social Worker

## 2022-12-24 NOTE — Patient Outreach (Signed)
  Care Coordination   Follow Up Visit Note   12/24/2022 Name: [name] MRN: [id number] DOB: [birth year]  [name] is a 37 y.o. year old female who sees Osei-Bonsu, Iona Beard, MD for primary care. I spoke with  [name] by phone today.  What matters to the patients health and wellness today?  Patient is concerned about housing needs , transport needs and food needs    Goals Addressed             This Visit's Progress    Patient is concerned about housing location and housing safety. She has stress related to transport needs and food needs       Intervention: LCSW spoke via phone with [name] today. Discussed transport needs of client; discussed housing needs of client. She is researching apartment complexes in area. She said if she does decide to move she wants to reside in this area to be close to her family Spoke of food procurement. She has 3 children who reside with her. Client receives food stamps benefit. She receives [employer] benefit.   Client said she continues to drive her car on short trips in the area. Discussed family support. She said she has periodic family support Reminded client of RN support, LCSW support and Pharmacy support in program Provided counseling support Encouraged [name] to call LCSW as needed for SW support at [phone]  Reviewed PCP support. She said she sees [name], PCP on January 08, 2023 Discussed energy level. She said she gets fatigued from time to time     SDOH assessments and interventions completed:  Yes  SDOH Interventions Today    Flowsheet Row Most Recent Value  SDOH Interventions   Depression Interventions/Treatment  Medication, Counseling  Stress Interventions Provide Counseling  [name] is concerned about her current residence. she is researching other apartment complexes in [place] Coordination Interventions:  Yes, provided   Interventions Today    Flowsheet Row Most  Recent Value  Chronic Disease   Chronic disease during today's visit Other  [discussed client needs and living situation]  General Interventions   General Interventions Discussed/Reviewed General Interventions Discussed, Community Resources  [reviewed program support]  Education Interventions   Education Provided Provided Education  Provided Orthoptist On [name] Discussed/Reviewed Anxiety, Coping Strategies  Nutrition Interventions   Nutrition Discussed/Reviewed Nutrition Discussed  [discussed food stamps benefit received. discussed [employer] Healthy food benefit she receives]  Pharmacy Interventions   Pharmacy Dicussed/Reviewed Medication Adherence  [she discussed iron infusions she has received in past]       Follow up plan: Follow up call scheduled for 01/28/23 at 10:30 AM    Encounter Outcome:  Pt. Visit Completed   [name].[name] MSW, [place] Holiday representative [facility] Care Management [phone]

## 2022-12-24 NOTE — Patient Instructions (Signed)
Visit Information  Thank you for taking time to visit with me today. Please don't hesitate to contact me if I can be of assistance to you.   Following are the goals we discussed today:   Goals Addressed             This Visit's Progress    Patient is concerned about housing location and housing safety. She has stress related to transport needs and food needs       Intervention: LCSW spoke via phone with [name] today. Discussed transport needs of client; discussed housing needs of client. She is researching apartment complexes in area. She said if she does decide to move she wants to reside in this area to be close to her family Spoke of food procurement. She has 3 children who reside with her. Client receives food stamps benefit. She receives [employer] benefit.   Client said she continues to drive her car on short trips in the area. Discussed family support. She said she has periodic family support Reminded client of RN support, LCSW support and Pharmacy support in program Provided counseling support Encouraged [name] to call LCSW as needed for SW support at [phone]  Reviewed PCP support. She said she sees Dr. Orma Render, PCP on January 08, 2023 Discussed energy level. She said she gets fatigued from time to time     Our next appointment is by telephone on 01/28/23 at 10:30 AM   Please call the care guide team at [phone] if you need to cancel or reschedule your appointment.   If you are experiencing a Mental Health or [place] or need someone to talk to, please go to [facility] [address] [place] [phone])   Patient verbalizes understanding of instructions and care plan provided today and agrees to view in [place]. Active MyChart status and patient understanding of how to access instructions and care plan via MyChart confirmed with patient.     The patient has been provided with contact  information for the care management team and has been advised to call with any health related questions or concerns.   Norva Riffle.Ramirez Fullbright MSW, [place] Holiday representative [facility] Care Management [phone]

## 2022-12-25 ENCOUNTER — Emergency Department (HOSPITAL_BASED_OUTPATIENT_CLINIC_OR_DEPARTMENT_OTHER)
Admission: EM | Admit: 2022-12-25 | Discharge: 2022-12-26 | Disposition: A | Discharge status: Home / Self Care | Payer: Medicare HMO | Attending: Emergency Medicine | Admitting: Emergency Medicine

## 2022-12-25 ENCOUNTER — Other Ambulatory Visit: Payer: Self-pay

## 2022-12-25 DIAGNOSIS — R143 Flatulence: Secondary | ICD-10-CM | POA: Diagnosis not present

## 2022-12-25 DIAGNOSIS — R112 Nausea with vomiting, unspecified: Secondary | ICD-10-CM | POA: Diagnosis not present

## 2022-12-25 DIAGNOSIS — J45909 Unspecified asthma, uncomplicated: Secondary | ICD-10-CM | POA: Insufficient documentation

## 2022-12-25 DIAGNOSIS — Z87891 Personal history of nicotine dependence: Secondary | ICD-10-CM | POA: Diagnosis not present

## 2022-12-25 LAB — COMPREHENSIVE METABOLIC PANEL
ALT: 11 U/L (ref 0–44)
AST: 15 U/L (ref 15–41)
Albumin: 4 g/dL (ref 3.5–5.0)
Alkaline Phosphatase: 80 U/L (ref 38–126)
Anion gap: 5 (ref 5–15)
BUN: 15 mg/dL (ref 6–20)
CO2: 26 mmol/L (ref 22–32)
Calcium: 8.9 mg/dL (ref 8.9–10.3)
Chloride: 106 mmol/L (ref 98–111)
Creatinine, Ser: 0.7 mg/dL (ref 0.44–1.00)
GFR, Estimated: >60 mL/min (ref >60)
Glucose, Bld: 98 mg/dL (ref 70–99)
Potassium: 3.7 mmol/L (ref 3.5–5.1)
Sodium: 137 mmol/L (ref 135–145)
Total Bilirubin: 0.3 mg/dL (ref 0.3–1.2)
Total Protein: 6.8 g/dL (ref 6.5–8.1)

## 2022-12-25 LAB — CBC
HCT: 34 % — ABNORMAL LOW (ref 36.0–46.0)
Hemoglobin: 11 g/dL — ABNORMAL LOW (ref 12.0–15.0)
MCH: 29.1 pg (ref 26.0–34.0)
MCHC: 32.4 g/dL (ref 30.0–36.0)
MCV: 89.9 fL (ref 80.0–100.0)
Platelets: 283 10*3/uL (ref 150–400)
RBC: 3.78 MIL/uL — ABNORMAL LOW (ref 3.87–5.11)
RDW: 14.5 % (ref 11.5–15.5)
WBC: 5.1 10*3/uL (ref 4.0–10.5)
nRBC: 0 % (ref 0.0–0.2)

## 2022-12-25 LAB — URINALYSIS, ROUTINE W REFLEX MICROSCOPIC
Bacteria, UA: NONE SEEN
Bilirubin Urine: NEGATIVE
Glucose, UA: NEGATIVE mg/dL
Ketones, ur: NEGATIVE mg/dL
Leukocytes,Ua: NEGATIVE
Nitrite: NEGATIVE
Protein, ur: NEGATIVE mg/dL
RBC / HPF: >50 RBC/hpf (ref 0–5)
Specific Gravity, Urine: 1.025 (ref 1.005–1.030)
pH: 6 (ref 5.0–8.0)

## 2022-12-25 LAB — PREGNANCY, URINE: Preg Test, Ur: NEGATIVE

## 2022-12-25 LAB — LIPASE, BLOOD: Lipase: 36 U/L (ref 11–51)

## 2022-12-25 MED ORDER — SUCRALFATE 1 G PO TABS: 1.0000 g | ORAL_TABLET | Freq: Four times a day (QID) | ORAL | 0 refills | Status: DC | PRN

## 2022-12-25 MED ORDER — ONDANSETRON 4 MG PO TBDP: 4.0000 mg | ORAL_TABLET | Freq: Three times a day (TID) | ORAL | 0 refills | Status: DC | PRN

## 2022-12-25 MED ORDER — SIMETHICONE 180 MG PO CAPS: 180.0000 mg | ORAL_CAPSULE | Freq: Three times a day (TID) | ORAL | 1 refills | Status: DC | PRN

## 2022-12-25 NOTE — Discharge Instructions (Signed)
You were evaluated in the Emergency Department and after careful evaluation, we did not find any emergent condition requiring admission or further testing in the hospital.  Your exam/testing today is overall reassuring.  Use the Zofran as needed for nausea, use the simethicone as needed for excess gas.  Use the Carafate 3 times daily to help with any acid related discomfort.  Please return to the Emergency Department if you experience any worsening of your condition.   Thank you for allowing [place] to be a part of your care.
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
## 2022-12-25 NOTE — ED Triage Notes (Signed)
Reports abd pain, N/V and gas for the last few days.

## 2022-12-26 NOTE — ED Notes (Signed)
Reviewed AVS/discharge instruction with patient. Time allotted for and all questions answered. Patient is agreeable for d/c and escorted to ed exit by staff.  

## 2022-12-27 DIAGNOSIS — F3181 Bipolar II disorder: Secondary | ICD-10-CM | POA: Diagnosis not present

## 2022-12-27 DIAGNOSIS — F411 Generalized anxiety disorder: Secondary | ICD-10-CM | POA: Diagnosis not present

## 2023-01-01 ENCOUNTER — Telehealth: Payer: Self-pay

## 2023-01-01 NOTE — Telephone Encounter (Signed)
     Patient  visit on 3/20  at [place]    Have you been able to follow up with your primary care physician? Yes   The patient was or was not able to obtain any needed medicine or equipment. Yes   Are there diet recommendations that you are having difficulty following? Na   Patient expresses understanding of discharge instructions and education provided has no other needs at this time.  Yes     [place] [phone] [address], [place], [place] [address] Phone: [phone] Email: [email or web address]

## 2023-01-14 DIAGNOSIS — B370 Candidal stomatitis: Secondary | ICD-10-CM | POA: Diagnosis not present

## 2023-01-17 DIAGNOSIS — F411 Generalized anxiety disorder: Secondary | ICD-10-CM | POA: Diagnosis not present

## 2023-01-17 DIAGNOSIS — F3181 Bipolar II disorder: Secondary | ICD-10-CM | POA: Diagnosis not present

## 2023-01-17 DIAGNOSIS — F331 Major depressive disorder, recurrent, moderate: Secondary | ICD-10-CM | POA: Diagnosis not present

## 2023-01-24 DIAGNOSIS — F411 Generalized anxiety disorder: Secondary | ICD-10-CM | POA: Diagnosis not present

## 2023-01-24 DIAGNOSIS — F3181 Bipolar II disorder: Secondary | ICD-10-CM | POA: Diagnosis not present

## 2023-01-28 ENCOUNTER — Ambulatory Visit: Payer: Self-pay | Admitting: Licensed Clinical Social Worker

## 2023-01-28 NOTE — Patient Instructions (Signed)
Visit Information  Thank you for taking time to visit with me today. Please don't hesitate to contact me if I can be of assistance to you.   Following are the goals we discussed today:   Goals Addressed             This Visit's Progress    Patient is concerned about housing location, transport needs, food needs       Interventions: LCSW and client have talked about housing needs. Client is looking for new apartment. Client drives car for local trips. She drives car short distances. Discussed previously some of local transport resources Client gets Food Stamps benefit. She receives [employer] benefit Family support from spouse, from client father, from client sister Client and spouse of client have LCSW name and phone number to call for SW support as needed for client.         Our next appointment is by telephone on 03/12/23 at 10:30 AM   Please call the care guide team at [phone] if you need to cancel or reschedule your appointment.   If you are experiencing a Mental Health or Behavioral Health Crisis or need someone to talk to, please go to [facility] [address] [place] [phone])   The patient verbalized understanding of instructions, educational materials, and care plan provided today and DECLINED offer to receive copy of patient instructions, educational materials, and care plan.   The patient has been provided with contact information for the care management team and has been advised to call with any health related questions or concerns.   Kelton Pillar.Lotus Santillo MSW, LCSW Licensed Visual merchandiser [facility] Care Management [phone]

## 2023-01-28 NOTE — Patient Outreach (Signed)
  Care Coordination   Follow Up Visit Note   01/28/2023 Name: [name] MRN: [id number] DOB: 1986-07-12  [name] is a 37 y.o. year old female who sees Osei-Bonsu, Greggory Stallion, MD for primary care. I spoke with  [name] by phone today.  What matters to the patients health and wellness today?  Patient is concerned about housing location, transport  needs, food needs.     Goals Addressed             This Visit's Progress    Patient is concerned about housing location, transport needs, food needs       Interventions: LCSW and client have talked about housing needs. Client is looking for new apartment. Client drives car for local trips. She drives car short distances. Discussed previously some of local transport resources Client gets Food Stamps benefit. She receives [employer] benefit Family support from spouse, from client father, from client sister Client and spouse of client have LCSW name and phone number to call for SW support as needed for client.         SDOH assessments and interventions completed:  Yes  SDOH Interventions Today    Flowsheet Row Most Recent Value  SDOH Interventions   Depression Interventions/Treatment  Medication, Counseling  Stress Interventions Provide Counseling        Care Coordination Interventions:  Yes, provided   Interventions Today    Flowsheet Row Most Recent Value  Chronic Disease   Chronic disease during today's visit Other  [client has needs related to food provision, meals provision. she has 3 children living at home with her]  General Interventions   General Interventions Discussed/Reviewed General Interventions Discussed, [employer] receives [employer] Stamps benefit. She receives [employer] benefit]  Education Interventions   Education Provided Provided Education  Provided Engineer, [employer]  Mental Health Interventions   Mental Health Discussed/Reviewed  Coping Strategies  [client is trying to cope with stress related to managing daily needs of herself and her children]  Nutrition Interventions   Nutrition Discussed/Reviewed Nutrition Discussed        Follow up plan: Follow up call scheduled for 03/12/23 at 10:30   AM  Encounter Outcome:  Pt. Visit Completed   [name] MSW, LCSW Licensed Visual merchandiser [facility] Management [phone]

## 2023-01-29 ENCOUNTER — Encounter: Payer: Self-pay | Admitting: Cardiology

## 2023-01-29 ENCOUNTER — Ambulatory Visit: Payer: Medicare HMO | Attending: Physician Assistant | Admitting: Cardiology

## 2023-01-29 VITALS — BP 100/60 | HR 70 | Ht 65.0 in | Wt 237.2 lb

## 2023-01-29 DIAGNOSIS — I10 Essential (primary) hypertension: Secondary | ICD-10-CM

## 2023-01-29 DIAGNOSIS — R002 Palpitations: Secondary | ICD-10-CM | POA: Diagnosis not present

## 2023-01-29 DIAGNOSIS — F331 Major depressive disorder, recurrent, moderate: Secondary | ICD-10-CM | POA: Diagnosis not present

## 2023-01-29 DIAGNOSIS — R42 Dizziness and giddiness: Secondary | ICD-10-CM

## 2023-01-29 DIAGNOSIS — Z8759 Personal history of other complications of pregnancy, childbirth and the puerperium: Secondary | ICD-10-CM | POA: Diagnosis not present

## 2023-01-29 DIAGNOSIS — I110 Hypertensive heart disease with heart failure: Secondary | ICD-10-CM

## 2023-01-29 DIAGNOSIS — E782 Mixed hyperlipidemia: Secondary | ICD-10-CM | POA: Diagnosis not present

## 2023-01-29 DIAGNOSIS — Z113 Encounter for screening for infections with a predominantly sexual mode of transmission: Secondary | ICD-10-CM | POA: Diagnosis not present

## 2023-01-29 DIAGNOSIS — Z79899 Other long term (current) drug therapy: Secondary | ICD-10-CM

## 2023-01-29 DIAGNOSIS — Z0000 Encounter for general adult medical examination without abnormal findings: Secondary | ICD-10-CM | POA: Diagnosis not present

## 2023-01-29 DIAGNOSIS — J4530 Mild persistent asthma, uncomplicated: Secondary | ICD-10-CM | POA: Diagnosis not present

## 2023-01-29 DIAGNOSIS — O1495 Unspecified pre-eclampsia, complicating the puerperium: Secondary | ICD-10-CM

## 2023-01-29 DIAGNOSIS — R7303 Prediabetes: Secondary | ICD-10-CM | POA: Diagnosis not present

## 2023-01-29 DIAGNOSIS — J301 Allergic rhinitis due to pollen: Secondary | ICD-10-CM | POA: Diagnosis not present

## 2023-01-29 DIAGNOSIS — E559 Vitamin D deficiency, unspecified: Secondary | ICD-10-CM | POA: Diagnosis not present

## 2023-01-29 NOTE — Patient Instructions (Signed)
Medication Instructions:  Your physician recommends that you continue on your current medications as directed. Please refer to the Current Medication list given to you today.  *If you need a refill on your cardiac medications before your next appointment, please call your pharmacy*   Lab Work: TOMORROW, COME ANYTIME AFTER 7:15 FASTING:  LIPID  If you have labs (blood work) drawn today and your tests are completely normal, you will receive your results only by: MyChart Message (if you have MyChart) OR A paper copy in the mail If you have any lab test that is abnormal or we need to change your treatment, we will call you to review the results.   Testing/Procedures: None ordered   Follow-Up: At [facility], you and your health needs are our priority.  As part of our continuing mission to provide you with exceptional heart care, we have created designated Provider Care Teams.  These Care Teams include your primary Cardiologist (physician) and Advanced Practice Providers (APPs -  Physician Assistants and Nurse Practitioners) who all work together to provide you with the care you need, when you need it.  We recommend signing up for the patient portal called "MyChart".  Sign up information is provided on this After Visit Summary.  MyChart is used to connect with patients for Virtual Visits (Telemedicine).  Patients are able to view lab/test results, encounter notes, upcoming appointments, etc.  Non-urgent messages can be sent to your provider as well.   To learn more about what you can do with MyChart, go to [email or web address].    Your next appointment:   4 month(s)  Provider:   Tereso Newcomer, PA-C         Other Instructions Your physician has requested that you regularly monitor and record your blood pressure readings at home TWICE A DAY, IN MORNINGS 2-3 HOURS AFTER MEDICATIONS, AND AT BEDTIME. Please use the same machine at the same time of day to check your readings and  record them to bring to your follow-up visit.   Please monitor blood pressures and keep a log of your readings and send a mychart message witht them.    Make sure to check 2 hours after your medications.    AVOID these things for 30 minutes before checking your blood pressure: No Drinking caffeine. No Drinking alcohol. No Eating. No Smoking. No Exercising.   Five minutes before checking your blood pressure: Pee. Sit in a dining chair. Avoid sitting in a soft couch or armchair. Be quiet. Do not talk
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
## 2023-01-30 ENCOUNTER — Ambulatory Visit: Payer: Medicare HMO | Attending: Cardiology

## 2023-01-30 DIAGNOSIS — Z79899 Other long term (current) drug therapy: Secondary | ICD-10-CM | POA: Diagnosis not present

## 2023-01-31 LAB — LIPID PANEL
Chol/HDL Ratio: 3.4 ratio (ref 0.0–4.4)
Cholesterol, Total: 164 mg/dL (ref 100–199)
HDL: 48 mg/dL (ref >39)
LDL Chol Calc (NIH): 105 mg/dL — ABNORMAL HIGH (ref 0–99)
Triglycerides: 57 mg/dL (ref 0–149)
VLDL Cholesterol Cal: 11 mg/dL (ref 5–40)

## 2023-03-01 DIAGNOSIS — F411 Generalized anxiety disorder: Secondary | ICD-10-CM | POA: Diagnosis not present

## 2023-03-01 DIAGNOSIS — F331 Major depressive disorder, recurrent, moderate: Secondary | ICD-10-CM | POA: Diagnosis not present

## 2023-03-01 DIAGNOSIS — F3181 Bipolar II disorder: Secondary | ICD-10-CM | POA: Diagnosis not present

## 2023-03-07 DIAGNOSIS — F411 Generalized anxiety disorder: Secondary | ICD-10-CM | POA: Diagnosis not present

## 2023-03-07 DIAGNOSIS — F3181 Bipolar II disorder: Secondary | ICD-10-CM | POA: Diagnosis not present

## 2023-03-12 ENCOUNTER — Ambulatory Visit: Payer: Self-pay | Admitting: Licensed Clinical Social Worker

## 2023-03-12 NOTE — Patient Instructions (Signed)
Visit Information  Thank you for taking time to visit with me today. Please don't hesitate to contact me if I can be of assistance to you.   Following are the goals we discussed today:   Goals Addressed             This Visit's Progress    Patient is concerned about housing location, transport needs, food needs       Interventions:  Spoke with client about client needs. Discussed food needs. She receives Food Stamps benefit. She receives [employer] benefit Discussed medication procurement Discussed transport needs. She has a car she drives for transport needs Client has said previously that she is looking for a different apartment where she and her children can reside Reviewed pain issues Discussed program support with RN, Pharmacist, and LCS Client has family support from her spouse, from client father, from client sister Client and spouse of client have LCSW name and phone number to call for SW support as needed for client.          Client has LCSW phone number and name and said she will call LCSW at later time to discuss her current needs  Please call the care guide team at [phone] if you need to cancel or reschedule your appointment.   If you are experiencing a Mental Health or Behavioral Health Crisis or need someone to talk to, please go to [facility] [address], [place] [phone])   The patient verbalized understanding of instructions, educational materials, and care plan provided today and DECLINED offer to receive copy of patient instructions, educational materials, and care plan.   The patient has been provided with contact information for the care management team and has been advised to call with any health related questions or concerns.   Kelton Pillar.Panayiota Larkin MSW, LCSW Licensed Visual merchandiser [facility] [phone]

## 2023-03-12 NOTE — Patient Outreach (Signed)
  Care Coordination   Follow Up Visit Note   03/12/2023 Name: ZEMIRAH STANICH MRN: [id number] DOB: [birth year]  Ferd Glassing Giampietro is a 37 y.o. year old female who sees Osei-Bonsu, Greggory Stallion, MD for primary care. I spoke with  Cori Razor by phone today.  What matters to the patients health and wellness today? Patient has concerns about housing location, transport needs and food needs     Goals Addressed             This Visit's Progress    Patient is concerned about housing location, transport needs, food needs       Interventions:  Spoke with client about client needs. Discussed food needs. She receives Food Stamps benefit. She receives [employer] benefit Discussed medication procurement Discussed transport needs. She has a car she drives for transport needs Client has said previously that she is looking for a different apartment where she and her children can reside Reviewed pain issues Discussed program support with RN, Pharmacist, and LCS Client has family support from her spouse, from client father, from client sister Client and spouse of client have LCSW name and phone number to call for SW support as needed for client.           SDOH assessments and interventions completed:  Yes  SDOH Interventions Today    Flowsheet Row Most Recent Value  SDOH Interventions   Depression Interventions/Treatment  Medication, Counseling  Stress Interventions Provide Counseling  [has stress related to managing medical needs. has stress related to finances]        Care Coordination Interventions:  Yes, provided   Interventions Today    Flowsheet Row Most Recent Value  Chronic Disease   Chronic disease during today's visit Other  [spoke with client about client needs]  General Interventions   General Interventions Discussed/Reviewed General Interventions Discussed, Community Resources  [reviewed program support]  Exercise Interventions   Exercise Discussed/Reviewed  Physical Activity  Education Interventions   Education Provided Provided Education  Provided Verbal Education On [employer] Stamps beneift. Discussed [employer] Healthy Food benefit]  Mental Health Interventions   Mental Health Discussed/Reviewed Anxiety, Coping Strategies  [discussed anxiety issues, stress issues. She has car she drives. she is trying to manage medical needs and daily living needs]  Nutrition Interventions   Nutrition Discussed/Reviewed Nutrition Discussed  Pharmacy Interventions   Pharmacy Dicussed/Reviewed Pharmacy Topics Discussed        Follow up plan:  Client has LCSW name and phone number and said she would call LCSW at a later time to discuss more her current needs.    Encounter Outcome:  Pt. Visit Completed   Kelton Pillar.Ronnie Mallette MSW, LCSW Licensed Visual merchandiser [facility] Care Management [phone]

## 2023-03-18 DIAGNOSIS — N920 Excessive and frequent menstruation with regular cycle: Secondary | ICD-10-CM | POA: Diagnosis not present

## 2023-03-18 DIAGNOSIS — Z9884 Bariatric surgery status: Secondary | ICD-10-CM | POA: Diagnosis not present

## 2023-03-18 DIAGNOSIS — D500 Iron deficiency anemia secondary to blood loss (chronic): Secondary | ICD-10-CM | POA: Diagnosis not present

## 2023-03-29 DIAGNOSIS — K912 Postsurgical malabsorption, not elsewhere classified: Secondary | ICD-10-CM | POA: Diagnosis not present

## 2023-03-29 DIAGNOSIS — Z903 Acquired absence of stomach [part of]: Secondary | ICD-10-CM | POA: Diagnosis not present

## 2023-03-29 DIAGNOSIS — D500 Iron deficiency anemia secondary to blood loss (chronic): Secondary | ICD-10-CM | POA: Diagnosis not present

## 2023-04-01 DIAGNOSIS — F3181 Bipolar II disorder: Secondary | ICD-10-CM | POA: Diagnosis not present

## 2023-04-01 DIAGNOSIS — F411 Generalized anxiety disorder: Secondary | ICD-10-CM | POA: Diagnosis not present

## 2023-04-01 DIAGNOSIS — F331 Major depressive disorder, recurrent, moderate: Secondary | ICD-10-CM | POA: Diagnosis not present

## 2023-04-04 DIAGNOSIS — F411 Generalized anxiety disorder: Secondary | ICD-10-CM | POA: Diagnosis not present

## 2023-04-04 DIAGNOSIS — F3181 Bipolar II disorder: Secondary | ICD-10-CM | POA: Diagnosis not present

## 2023-04-24 ENCOUNTER — Other Ambulatory Visit: Payer: Self-pay

## 2023-04-24 MED ORDER — FUROSEMIDE 40 MG PO TABS: 20.0000 mg | ORAL_TABLET | Freq: Every day | ORAL | 2 refills | Status: DC | PRN

## 2023-05-14 DIAGNOSIS — M542 Cervicalgia: Secondary | ICD-10-CM | POA: Diagnosis not present

## 2023-05-21 DIAGNOSIS — U071 COVID-19: Secondary | ICD-10-CM | POA: Diagnosis not present

## 2023-05-28 NOTE — Progress Notes (Deleted)
Cardiology Office Note:    Date:  05/28/2023  ID:  [name], DOB 03/02/1986, MRN [id number] PCP: Jackie Plum, MD  [facility] Providers Cardiologist:  Meriam Sprague, MD { Click to update primary MD,subspecialty MD or APP then REFRESH:1}    {Click to Open Review  :1}   Patient Profile:      Hypertension Pre-eclampsia  3rd child during post partum period (HFpEF) heart failure with preserved ejection fraction  Hx of vol overload after delivery of 2nd child TTE 11/25/2020: EF 55-60, no RWMA, GLS -19.2, normal RVSF, trivial MR, RAP 3 OSA Anxiety/depression Bipolar d/o Palpitations  Monitor 11/29/2022: NSR, rare PACs/PVCs, no sustained arrhythmias S/p bilat tubal ligation Morbid obesity s/p gastric bypass      {  Former [facility] patient Last seen by Doristine Church 01/2023 Norvasc dec to 7.5 once daily lower BP due to wt loss    :1}     Discussed the use of AI scribe software for clinical note transcription with the patient, who gave verbal consent to proceed.  History of Present Illness          ROS: ***    Studies Reviewed:       *** Risk Assessment/Calculations:   {Does this patient have ATRIAL FIBRILLATION?:[phone]} No BP recorded.  {Refresh Note OR Click here to enter BP  :1}***       Physical Exam:   VS:  There were no vitals taken for this visit.   Wt Readings from Last 3 Encounters:  01/29/23 237 lb 3.2 oz (107.6 kg)  12/25/22 236 lb (107 kg)  10/31/22 225 lb 9.6 oz (102.3 kg)    Physical Exam***     Assessment and Plan:  No problem-specific Assessment & Plan notes found for this encounter. Assessment and Plan            {      :1}    {Are you ordering a CV Procedure (e.g. stress test, cath, DCCV, TEE, etc)?   Press F2        :[id number]}  Dispo:  No follow-ups on file.  Signed, Tereso Newcomer, PA-C

## 2023-05-29 ENCOUNTER — Ambulatory Visit: Payer: Medicare HMO | Attending: Physician Assistant | Admitting: Physician Assistant

## 2023-05-29 DIAGNOSIS — I110 Hypertensive heart disease with heart failure: Secondary | ICD-10-CM

## 2023-05-31 DIAGNOSIS — D500 Iron deficiency anemia secondary to blood loss (chronic): Secondary | ICD-10-CM | POA: Diagnosis not present

## 2023-07-18 DIAGNOSIS — N898 Other specified noninflammatory disorders of vagina: Secondary | ICD-10-CM | POA: Diagnosis not present

## 2023-07-29 ENCOUNTER — Other Ambulatory Visit: Payer: Self-pay

## 2023-07-29 MED ORDER — CARVEDILOL 12.5 MG PO TABS: 12.5000 mg | ORAL_TABLET | Freq: Two times a day (BID) | ORAL | 1 refills | Status: DC

## 2023-08-07 ENCOUNTER — Other Ambulatory Visit: Payer: Self-pay

## 2023-08-07 DIAGNOSIS — Z13820 Encounter for screening for osteoporosis: Secondary | ICD-10-CM | POA: Diagnosis not present

## 2023-08-07 DIAGNOSIS — Z9884 Bariatric surgery status: Secondary | ICD-10-CM | POA: Diagnosis not present

## 2023-08-07 MED ORDER — AMLODIPINE BESYLATE 5 MG PO TABS: 5.0000 mg | ORAL_TABLET | Freq: Every day | ORAL | 1 refills | Status: DC

## 2023-08-26 DIAGNOSIS — N939 Abnormal uterine and vaginal bleeding, unspecified: Secondary | ICD-10-CM | POA: Diagnosis not present

## 2023-08-26 DIAGNOSIS — Z9884 Bariatric surgery status: Secondary | ICD-10-CM | POA: Diagnosis not present

## 2023-08-26 DIAGNOSIS — D500 Iron deficiency anemia secondary to blood loss (chronic): Secondary | ICD-10-CM | POA: Diagnosis not present

## 2023-08-26 DIAGNOSIS — K912 Postsurgical malabsorption, not elsewhere classified: Secondary | ICD-10-CM | POA: Diagnosis not present

## 2023-08-26 DIAGNOSIS — Z903 Acquired absence of stomach [part of]: Secondary | ICD-10-CM | POA: Diagnosis not present

## 2023-09-16 DIAGNOSIS — R0989 Other specified symptoms and signs involving the circulatory and respiratory systems: Secondary | ICD-10-CM | POA: Diagnosis not present

## 2023-09-16 DIAGNOSIS — I10 Essential (primary) hypertension: Secondary | ICD-10-CM | POA: Diagnosis not present

## 2023-09-16 DIAGNOSIS — K912 Postsurgical malabsorption, not elsewhere classified: Secondary | ICD-10-CM | POA: Diagnosis not present

## 2023-09-16 DIAGNOSIS — Z9884 Bariatric surgery status: Secondary | ICD-10-CM | POA: Diagnosis not present

## 2023-09-16 DIAGNOSIS — J069 Acute upper respiratory infection, unspecified: Secondary | ICD-10-CM | POA: Diagnosis not present

## 2023-09-16 DIAGNOSIS — D500 Iron deficiency anemia secondary to blood loss (chronic): Secondary | ICD-10-CM | POA: Diagnosis not present

## 2023-09-16 DIAGNOSIS — R051 Acute cough: Secondary | ICD-10-CM | POA: Diagnosis not present

## 2023-09-16 DIAGNOSIS — Z903 Acquired absence of stomach [part of]: Secondary | ICD-10-CM | POA: Diagnosis not present

## 2023-09-25 DIAGNOSIS — J4530 Mild persistent asthma, uncomplicated: Secondary | ICD-10-CM | POA: Diagnosis not present

## 2023-09-25 DIAGNOSIS — N898 Other specified noninflammatory disorders of vagina: Secondary | ICD-10-CM | POA: Diagnosis not present

## 2023-09-25 DIAGNOSIS — J301 Allergic rhinitis due to pollen: Secondary | ICD-10-CM | POA: Diagnosis not present

## 2023-09-25 DIAGNOSIS — F331 Major depressive disorder, recurrent, moderate: Secondary | ICD-10-CM | POA: Diagnosis not present

## 2023-09-25 DIAGNOSIS — Z0001 Encounter for general adult medical examination with abnormal findings: Secondary | ICD-10-CM | POA: Diagnosis not present

## 2023-09-25 DIAGNOSIS — R7303 Prediabetes: Secondary | ICD-10-CM | POA: Diagnosis not present

## 2023-09-25 DIAGNOSIS — E559 Vitamin D deficiency, unspecified: Secondary | ICD-10-CM | POA: Diagnosis not present

## 2023-09-25 DIAGNOSIS — E782 Mixed hyperlipidemia: Secondary | ICD-10-CM | POA: Diagnosis not present

## 2023-09-25 DIAGNOSIS — Z720 Tobacco use: Secondary | ICD-10-CM | POA: Diagnosis not present

## 2023-09-25 DIAGNOSIS — I10 Essential (primary) hypertension: Secondary | ICD-10-CM | POA: Diagnosis not present

## 2023-10-28 DIAGNOSIS — J028 Acute pharyngitis due to other specified organisms: Secondary | ICD-10-CM | POA: Diagnosis not present

## 2023-10-28 DIAGNOSIS — E010 Iodine-deficiency related diffuse (endemic) goiter: Secondary | ICD-10-CM | POA: Diagnosis not present

## 2023-10-28 DIAGNOSIS — R7303 Prediabetes: Secondary | ICD-10-CM | POA: Diagnosis not present

## 2023-10-29 ENCOUNTER — Other Ambulatory Visit: Payer: Self-pay | Admitting: Physician Assistant

## 2023-10-29 DIAGNOSIS — J028 Acute pharyngitis due to other specified organisms: Secondary | ICD-10-CM | POA: Diagnosis not present

## 2023-10-29 DIAGNOSIS — E010 Iodine-deficiency related diffuse (endemic) goiter: Secondary | ICD-10-CM | POA: Diagnosis not present

## 2023-10-29 DIAGNOSIS — R42 Dizziness and giddiness: Secondary | ICD-10-CM | POA: Diagnosis not present

## 2023-10-29 DIAGNOSIS — R7303 Prediabetes: Secondary | ICD-10-CM | POA: Diagnosis not present

## 2023-10-30 ENCOUNTER — Ambulatory Visit
Admission: RE | Admit: 2023-10-30 | Discharge: 2023-10-30 | Discharge status: Home / Self Care | Payer: Medicare HMO | Source: Ambulatory Visit | Attending: Physician Assistant | Admitting: Physician Assistant

## 2023-10-30 DIAGNOSIS — E010 Iodine-deficiency related diffuse (endemic) goiter: Secondary | ICD-10-CM

## 2023-10-30 DIAGNOSIS — E049 Nontoxic goiter, unspecified: Secondary | ICD-10-CM | POA: Diagnosis not present

## 2023-11-04 DIAGNOSIS — H52223 Regular astigmatism, bilateral: Secondary | ICD-10-CM | POA: Diagnosis not present

## 2023-11-04 DIAGNOSIS — H5213 Myopia, bilateral: Secondary | ICD-10-CM | POA: Diagnosis not present

## 2023-11-12 DIAGNOSIS — D500 Iron deficiency anemia secondary to blood loss (chronic): Secondary | ICD-10-CM | POA: Diagnosis not present

## 2023-11-12 DIAGNOSIS — Z202 Contact with and (suspected) exposure to infections with a predominantly sexual mode of transmission: Secondary | ICD-10-CM | POA: Diagnosis not present

## 2023-11-12 DIAGNOSIS — N920 Excessive and frequent menstruation with regular cycle: Secondary | ICD-10-CM | POA: Diagnosis not present

## 2023-11-14 DIAGNOSIS — E559 Vitamin D deficiency, unspecified: Secondary | ICD-10-CM | POA: Diagnosis not present

## 2023-11-14 DIAGNOSIS — F331 Major depressive disorder, recurrent, moderate: Secondary | ICD-10-CM | POA: Diagnosis not present

## 2023-11-14 DIAGNOSIS — J4530 Mild persistent asthma, uncomplicated: Secondary | ICD-10-CM | POA: Diagnosis not present

## 2023-11-14 DIAGNOSIS — I10 Essential (primary) hypertension: Secondary | ICD-10-CM | POA: Diagnosis not present

## 2023-11-14 DIAGNOSIS — Z720 Tobacco use: Secondary | ICD-10-CM | POA: Diagnosis not present

## 2023-11-14 DIAGNOSIS — J301 Allergic rhinitis due to pollen: Secondary | ICD-10-CM | POA: Diagnosis not present

## 2023-11-14 DIAGNOSIS — R7303 Prediabetes: Secondary | ICD-10-CM | POA: Diagnosis not present

## 2023-11-14 DIAGNOSIS — E782 Mixed hyperlipidemia: Secondary | ICD-10-CM | POA: Diagnosis not present

## 2023-11-18 DIAGNOSIS — D500 Iron deficiency anemia secondary to blood loss (chronic): Secondary | ICD-10-CM | POA: Diagnosis not present

## 2023-11-18 DIAGNOSIS — I10 Essential (primary) hypertension: Secondary | ICD-10-CM | POA: Diagnosis not present

## 2023-11-18 DIAGNOSIS — F3130 Bipolar disorder, current episode depressed, mild or moderate severity, unspecified: Secondary | ICD-10-CM | POA: Diagnosis not present

## 2023-11-18 DIAGNOSIS — K912 Postsurgical malabsorption, not elsewhere classified: Secondary | ICD-10-CM | POA: Diagnosis not present

## 2023-11-18 DIAGNOSIS — R7303 Prediabetes: Secondary | ICD-10-CM | POA: Diagnosis not present

## 2023-11-18 DIAGNOSIS — G4733 Obstructive sleep apnea (adult) (pediatric): Secondary | ICD-10-CM | POA: Diagnosis not present

## 2023-11-18 DIAGNOSIS — K219 Gastro-esophageal reflux disease without esophagitis: Secondary | ICD-10-CM | POA: Diagnosis not present

## 2023-11-18 DIAGNOSIS — Z9884 Bariatric surgery status: Secondary | ICD-10-CM | POA: Diagnosis not present

## 2023-12-31 ENCOUNTER — Other Ambulatory Visit: Payer: Self-pay | Admitting: Cardiology

## 2024-01-13 DIAGNOSIS — F3130 Bipolar disorder, current episode depressed, mild or moderate severity, unspecified: Secondary | ICD-10-CM | POA: Diagnosis not present

## 2024-01-13 DIAGNOSIS — G4733 Obstructive sleep apnea (adult) (pediatric): Secondary | ICD-10-CM | POA: Diagnosis not present

## 2024-01-13 DIAGNOSIS — Z9884 Bariatric surgery status: Secondary | ICD-10-CM | POA: Diagnosis not present

## 2024-01-13 DIAGNOSIS — F909 Attention-deficit hyperactivity disorder, unspecified type: Secondary | ICD-10-CM | POA: Diagnosis not present

## 2024-01-13 DIAGNOSIS — R7303 Prediabetes: Secondary | ICD-10-CM | POA: Diagnosis not present

## 2024-01-13 DIAGNOSIS — E669 Obesity, unspecified: Secondary | ICD-10-CM | POA: Diagnosis not present

## 2024-01-13 DIAGNOSIS — I10 Essential (primary) hypertension: Secondary | ICD-10-CM | POA: Diagnosis not present

## 2024-01-13 DIAGNOSIS — D500 Iron deficiency anemia secondary to blood loss (chronic): Secondary | ICD-10-CM | POA: Diagnosis not present

## 2024-01-13 DIAGNOSIS — K219 Gastro-esophageal reflux disease without esophagitis: Secondary | ICD-10-CM | POA: Diagnosis not present

## 2024-01-14 ENCOUNTER — Telehealth: Payer: Self-pay

## 2024-01-14 NOTE — Progress Notes (Signed)
   01/14/2024  Patient ID: [name], female   DOB: March 08, 1986, 38 y.o.   MRN: [id number]  Contacted patient regarding medication adherence from a quality report for [facility]. The patient failed [facility] in 2024.     Per [facility] and payor portal fill history: Lisinopril 30 mg - last filled 12/25/23 for a 30-day supply.   *Patient eligible for a 90-day supply. Will follow up with patient and get authorization to request 90-day supply from PCP.    Thank you for allowing pharmacy to be a part of this patient's care.   Harlon Flor, PharmD Clinical Pharmacist  [phone]

## 2024-01-15 DIAGNOSIS — H5213 Myopia, bilateral: Secondary | ICD-10-CM | POA: Diagnosis not present

## 2024-01-21 ENCOUNTER — Other Ambulatory Visit: Payer: Self-pay | Admitting: Cardiology

## 2024-01-21 ENCOUNTER — Other Ambulatory Visit: Payer: Self-pay

## 2024-01-21 MED ORDER — FUROSEMIDE 40 MG PO TABS: 20.0000 mg | ORAL_TABLET | Freq: Every day | ORAL | 0 refills | Status: AC | PRN

## 2024-01-28 DIAGNOSIS — R7303 Prediabetes: Secondary | ICD-10-CM | POA: Diagnosis not present

## 2024-01-28 DIAGNOSIS — E559 Vitamin D deficiency, unspecified: Secondary | ICD-10-CM | POA: Diagnosis not present

## 2024-01-28 DIAGNOSIS — Z0001 Encounter for general adult medical examination with abnormal findings: Secondary | ICD-10-CM | POA: Diagnosis not present

## 2024-01-28 DIAGNOSIS — J301 Allergic rhinitis due to pollen: Secondary | ICD-10-CM | POA: Diagnosis not present

## 2024-01-28 DIAGNOSIS — Z720 Tobacco use: Secondary | ICD-10-CM | POA: Diagnosis not present

## 2024-01-28 DIAGNOSIS — E782 Mixed hyperlipidemia: Secondary | ICD-10-CM | POA: Diagnosis not present

## 2024-01-28 DIAGNOSIS — J4530 Mild persistent asthma, uncomplicated: Secondary | ICD-10-CM | POA: Diagnosis not present

## 2024-01-28 DIAGNOSIS — I10 Essential (primary) hypertension: Secondary | ICD-10-CM | POA: Diagnosis not present

## 2024-01-28 DIAGNOSIS — F331 Major depressive disorder, recurrent, moderate: Secondary | ICD-10-CM | POA: Diagnosis not present

## 2024-02-25 DIAGNOSIS — N921 Excessive and frequent menstruation with irregular cycle: Secondary | ICD-10-CM | POA: Diagnosis not present

## 2024-02-25 DIAGNOSIS — J0110 Acute frontal sinusitis, unspecified: Secondary | ICD-10-CM | POA: Diagnosis not present

## 2024-02-25 DIAGNOSIS — Z01419 Encounter for gynecological examination (general) (routine) without abnormal findings: Secondary | ICD-10-CM | POA: Diagnosis not present

## 2024-02-25 DIAGNOSIS — J301 Allergic rhinitis due to pollen: Secondary | ICD-10-CM | POA: Diagnosis not present

## 2024-03-04 DIAGNOSIS — N920 Excessive and frequent menstruation with regular cycle: Secondary | ICD-10-CM | POA: Diagnosis not present

## 2024-03-30 ENCOUNTER — Other Ambulatory Visit: Payer: Self-pay | Admitting: Physician Assistant

## 2024-03-31 DIAGNOSIS — Z01812 Encounter for preprocedural laboratory examination: Secondary | ICD-10-CM | POA: Diagnosis not present

## 2024-04-01 ENCOUNTER — Encounter (HOSPITAL_COMMUNITY): Payer: Self-pay | Admitting: Obstetrics and Gynecology

## 2024-04-01 NOTE — Progress Notes (Signed)
 Spoke w/ via phone for pre-op interview--- [facility] needs dos---- UPT, BMP and CBC per surgeon. EKG per anesthesia.        Lab results------ COVID test -----patient states asymptomatic no test needed Arrive at -------0530 NPO after MN NO Solid Food.   Pre-Surgery Ensure or G2:  Med rec completed Medications to take morning of surgery -----Norvasc , Wellbutrin, Albuterol -bring, Coreg , Lamictal and Omeprazole.  Diabetic medication -----  GLP1 agonist last dose: GLP1 instructions:  Patient instructed no nail polish to be worn day of surgery Patient instructed to bring photo id and insurance card day of surgery Patient aware to have Driver (ride ) / caregiver    for 24 hours after surgery - Cousin- Juanita Tobia Patient Special Instructions ----- shower with antibacterial soap. Pre-Op special Instructions -----  Patient verbalized understanding of instructions that were given at this phone interview. Patient denies chest pain, sob, fever, cough at the interview.
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
## 2024-04-08 NOTE — Anesthesia Preprocedure Evaluation (Signed)
 Anesthesia Evaluation  Patient identified by MRN, date of birth, ID band Patient awake    Reviewed: Allergy & Precautions, NPO status , Patient's Chart, lab work & pertinent test results  Airway Mallampati: II  TM Distance: >3 FB Neck ROM: Full    Dental no notable dental hx.    Pulmonary asthma , sleep apnea , former smoker   Pulmonary exam normal        Cardiovascular hypertension,  Rhythm:Regular Rate:Normal     Neuro/Psych  Headaches    GI/Hepatic Neg liver ROS,GERD  ,,  Endo/Other  negative endocrine ROS    Renal/GU negative Renal ROS  Female GU complaint     Musculoskeletal  (+) Arthritis , Osteoarthritis,    Abdominal Normal abdominal exam  (+)   Peds  Hematology Lab Results      Component                Value               Date                      WBC                      5.1                 12/25/2022                HGB                      11.0 (L)            12/25/2022                HCT                      34.0 (L)            12/25/2022                MCV                      89.9                12/25/2022                PLT                      283                 12/25/2022              Anesthesia Other Findings   Reproductive/Obstetrics                              Anesthesia Physical Anesthesia Plan  ASA: 3  Anesthesia Plan: General   Post-op Pain Management: Tylenol  PO (pre-op)*   Induction: Intravenous  PONV Risk Score and Plan: 3 and Ondansetron , Dexamethasone , Midazolam  and Treatment may vary due to age or medical condition  Airway Management Planned: Mask and LMA  Additional Equipment: None  Intra-op Plan:   Post-operative Plan: Extubation in OR  Informed Consent: I have reviewed the patients History and Physical, chart, labs and discussed the procedure including the risks, benefits and alternatives for the proposed anesthesia with the patient or  authorized representative who has indicated his/her understanding  and acceptance.     Dental advisory given  Plan Discussed with: CRNA  Anesthesia Plan Comments:          Anesthesia Quick Evaluation

## 2024-04-09 ENCOUNTER — Encounter (HOSPITAL_COMMUNITY)
Admission: RE | Disposition: A | Discharge status: Home / Self Care | Payer: Self-pay | Source: Home / Self Care | Attending: Obstetrics and Gynecology

## 2024-04-09 ENCOUNTER — Other Ambulatory Visit: Payer: Self-pay

## 2024-04-09 ENCOUNTER — Ambulatory Visit (HOSPITAL_COMMUNITY): Payer: Self-pay | Admitting: Anesthesiology

## 2024-04-09 ENCOUNTER — Encounter (HOSPITAL_COMMUNITY): Payer: Self-pay | Admitting: Obstetrics and Gynecology

## 2024-04-09 ENCOUNTER — Encounter (HOSPITAL_COMMUNITY): Payer: Self-pay | Admitting: Anesthesiology

## 2024-04-09 ENCOUNTER — Ambulatory Visit (HOSPITAL_COMMUNITY)
Admission: RE | Admit: 2024-04-09 | Discharge: 2024-04-09 | Disposition: A | Discharge status: Home / Self Care | Attending: Obstetrics and Gynecology | Admitting: Obstetrics and Gynecology

## 2024-04-09 DIAGNOSIS — Z79899 Other long term (current) drug therapy: Secondary | ICD-10-CM | POA: Insufficient documentation

## 2024-04-09 DIAGNOSIS — N921 Excessive and frequent menstruation with irregular cycle: Secondary | ICD-10-CM | POA: Diagnosis not present

## 2024-04-09 DIAGNOSIS — G4733 Obstructive sleep apnea (adult) (pediatric): Secondary | ICD-10-CM

## 2024-04-09 DIAGNOSIS — N920 Excessive and frequent menstruation with regular cycle: Secondary | ICD-10-CM | POA: Diagnosis not present

## 2024-04-09 DIAGNOSIS — D649 Anemia, unspecified: Secondary | ICD-10-CM | POA: Diagnosis not present

## 2024-04-09 DIAGNOSIS — Z87891 Personal history of nicotine dependence: Secondary | ICD-10-CM | POA: Insufficient documentation

## 2024-04-09 DIAGNOSIS — I110 Hypertensive heart disease with heart failure: Secondary | ICD-10-CM | POA: Diagnosis not present

## 2024-04-09 DIAGNOSIS — I10 Essential (primary) hypertension: Secondary | ICD-10-CM

## 2024-04-09 DIAGNOSIS — I509 Heart failure, unspecified: Secondary | ICD-10-CM | POA: Diagnosis not present

## 2024-04-09 HISTORY — PX: HYDROTHERMAL ABLATION/NOVASURE: SHX7586

## 2024-04-09 HISTORY — PX: DILATATION & CURRETTAGE/HYSTEROSCOPY WITH RESECTOCOPE: SHX5572

## 2024-04-09 LAB — CBC
HCT: 36.2 % (ref 36.0–46.0)
Hemoglobin: 10.8 g/dL — ABNORMAL LOW (ref 12.0–15.0)
MCH: 26.4 pg (ref 26.0–34.0)
MCHC: 29.8 g/dL — ABNORMAL LOW (ref 30.0–36.0)
MCV: 88.5 fL (ref 80.0–100.0)
Platelets: 304 10*3/uL (ref 150–400)
RBC: 4.09 MIL/uL (ref 3.87–5.11)
RDW: 15.4 % (ref 11.5–15.5)
WBC: 4.3 10*3/uL (ref 4.0–10.5)
nRBC: 0 % (ref 0.0–0.2)

## 2024-04-09 LAB — BASIC METABOLIC PANEL WITH GFR
Anion gap: 10 (ref 5–15)
BUN: 7 mg/dL (ref 6–20)
CO2: 24 mmol/L (ref 22–32)
Calcium: 9.4 mg/dL (ref 8.9–10.3)
Chloride: 103 mmol/L (ref 98–111)
Creatinine, Ser: 0.97 mg/dL (ref 0.44–1.00)
GFR, Estimated: >60 mL/min (ref >60)
Glucose, Bld: 91 mg/dL (ref 70–99)
Potassium: 3.4 mmol/L — ABNORMAL LOW (ref 3.5–5.1)
Sodium: 137 mmol/L (ref 135–145)

## 2024-04-09 LAB — POCT PREGNANCY, URINE: Preg Test, Ur: NEGATIVE

## 2024-04-09 SURGERY — DILATATION & CURETTAGE/HYSTEROSCOPY WITH RESECTOCOPE
Anesthesia: General

## 2024-04-09 MED ORDER — MIDAZOLAM HCL 2 MG/2ML IJ SOLN
INTRAMUSCULAR | Status: DC | PRN
  Administered 2024-04-09: 2 mg via INTRAVENOUS

## 2024-04-09 MED ORDER — SCOPOLAMINE 1 MG/3DAYS TD PT72
MEDICATED_PATCH | TRANSDERMAL | Status: DC
Start: 2024-04-09 — End: 2024-04-09
  Filled 2024-04-09: qty 1

## 2024-04-09 MED ORDER — GLYCOPYRROLATE PF 0.2 MG/ML IJ SOSY
PREFILLED_SYRINGE | INTRAMUSCULAR | Status: AC
  Filled 2024-04-09: qty 1

## 2024-04-09 MED ORDER — PROPOFOL 10 MG/ML IV BOLUS
INTRAVENOUS | Status: AC
  Filled 2024-04-09: qty 20

## 2024-04-09 MED ORDER — LIDOCAINE HCL 2 % IJ SOLN
INTRAMUSCULAR | Status: DC | PRN
  Administered 2024-04-09: 16 mL

## 2024-04-09 MED ORDER — ACETAMINOPHEN 500 MG PO TABS
1000.0000 mg | ORAL_TABLET | Freq: Once | ORAL | Status: AC
  Administered 2024-04-09: 1000 mg via ORAL

## 2024-04-09 MED ORDER — PROPOFOL 10 MG/ML IV BOLUS
INTRAVENOUS | Status: DC | PRN
  Administered 2024-04-09: 180 mg via INTRAVENOUS

## 2024-04-09 MED ORDER — LIDOCAINE 2% (20 MG/ML) 5 ML SYRINGE
INTRAMUSCULAR | Status: DC | PRN
  Administered 2024-04-09: 60 mg via INTRAVENOUS

## 2024-04-09 MED ORDER — FENTANYL CITRATE (PF) 250 MCG/5ML IJ SOLN
INTRAMUSCULAR | Status: DC | PRN
  Administered 2024-04-09: 50 ug via INTRAVENOUS

## 2024-04-09 MED ORDER — FENTANYL CITRATE (PF) 100 MCG/2ML IJ SOLN: 25.0000 ug | INTRAMUSCULAR | Status: DC | PRN

## 2024-04-09 MED ORDER — LACTATED RINGERS IV SOLN: INTRAVENOUS | Status: DC

## 2024-04-09 MED ORDER — CHLORHEXIDINE GLUCONATE 0.12 % MT SOLN
15.0000 mL | Freq: Once | OROMUCOSAL | Status: AC
  Administered 2024-04-09: 15 mL via OROMUCOSAL

## 2024-04-09 MED ORDER — DEXAMETHASONE SODIUM PHOSPHATE 10 MG/ML IJ SOLN
INTRAMUSCULAR | Status: DC | PRN
  Administered 2024-04-09: 10 mg via INTRAVENOUS

## 2024-04-09 MED ORDER — AMISULPRIDE (ANTIEMETIC) 5 MG/2ML IV SOLN: 10.0000 mg | Freq: Once | INTRAVENOUS | Status: DC | PRN

## 2024-04-09 MED ORDER — CHLORHEXIDINE GLUCONATE 0.12 % MT SOLN
OROMUCOSAL | Status: AC
  Filled 2024-04-09: qty 15

## 2024-04-09 MED ORDER — FENTANYL CITRATE (PF) 250 MCG/5ML IJ SOLN
INTRAMUSCULAR | Status: AC
  Filled 2024-04-09: qty 5

## 2024-04-09 MED ORDER — SODIUM CHLORIDE 0.9 % IR SOLN
Status: DC | PRN
  Administered 2024-04-09: 3000 mL

## 2024-04-09 MED ORDER — SCOPOLAMINE 1 MG/3DAYS TD PT72
1.0000 | MEDICATED_PATCH | TRANSDERMAL | Status: DC
  Administered 2024-04-09: 1.5 mg via TRANSDERMAL

## 2024-04-09 MED ORDER — KETOROLAC TROMETHAMINE 30 MG/ML IJ SOLN
INTRAMUSCULAR | Status: AC
  Filled 2024-04-09: qty 1

## 2024-04-09 MED ORDER — LIDOCAINE 2% (20 MG/ML) 5 ML SYRINGE
INTRAMUSCULAR | Status: AC
  Filled 2024-04-09: qty 5

## 2024-04-09 MED ORDER — ONDANSETRON HCL 4 MG/2ML IJ SOLN
INTRAMUSCULAR | Status: AC
  Filled 2024-04-09: qty 2

## 2024-04-09 MED ORDER — PROPOFOL 10 MG/ML IV BOLUS
INTRAVENOUS | Status: AC
Start: 2024-04-09 — End: 2024-04-09
  Filled 2024-04-09: qty 20

## 2024-04-09 MED ORDER — ONDANSETRON HCL 4 MG/2ML IJ SOLN
INTRAMUSCULAR | Status: DC | PRN
  Administered 2024-04-09: 4 mg via INTRAVENOUS

## 2024-04-09 MED ORDER — HYDROCODONE-ACETAMINOPHEN 5-325 MG PO TABS: 1.0000 | ORAL_TABLET | Freq: Four times a day (QID) | ORAL | 0 refills | Status: DC | PRN

## 2024-04-09 MED ORDER — DEXAMETHASONE SODIUM PHOSPHATE 10 MG/ML IJ SOLN
INTRAMUSCULAR | Status: AC
  Filled 2024-04-09: qty 1

## 2024-04-09 MED ORDER — GLYCOPYRROLATE PF 0.2 MG/ML IJ SOSY
PREFILLED_SYRINGE | INTRAMUSCULAR | Status: DC | PRN
  Administered 2024-04-09: .2 mg via INTRAVENOUS

## 2024-04-09 MED ORDER — LIDOCAINE HCL 2 % IJ SOLN
INTRAMUSCULAR | Status: AC
  Filled 2024-04-09: qty 20

## 2024-04-09 MED ORDER — LACTATED RINGERS IV SOLN
INTRAVENOUS | Status: DC
  Administered 2024-04-09: 1000 mL via INTRAVENOUS

## 2024-04-09 MED ORDER — EPHEDRINE SULFATE-NACL 50-0.9 MG/10ML-% IV SOSY
PREFILLED_SYRINGE | INTRAVENOUS | Status: DC | PRN
  Administered 2024-04-09 (×2): 5 mg via INTRAVENOUS

## 2024-04-09 MED ORDER — MIDAZOLAM HCL 2 MG/2ML IJ SOLN
INTRAMUSCULAR | Status: AC
  Filled 2024-04-09: qty 2

## 2024-04-09 MED ORDER — ORAL CARE MOUTH RINSE: 15.0000 mL | Freq: Once | OROMUCOSAL | Status: AC

## 2024-04-09 MED ORDER — ACETAMINOPHEN 500 MG PO TABS
ORAL_TABLET | ORAL | Status: AC
  Filled 2024-04-09: qty 2

## 2024-04-09 SURGICAL SUPPLY — 17 items
ABLATOR SURESOUND NOVASURE (ABLATOR) ×2 IMPLANT
GLOVE BIO SURGEON STRL SZ8 (GLOVE) ×2 IMPLANT
GLOVE BIOGEL PI IND STRL 7.0 (GLOVE) IMPLANT
GLOVE BIOGEL PI IND STRL 7.5 (GLOVE) IMPLANT
GLOVE ORTHO TXT STRL SZ7.5 (GLOVE) ×2 IMPLANT
GLOVE SURG UNDER POLY LF SZ7 (GLOVE) ×2 IMPLANT
GOWN STRL REUS W/ TWL XL LVL3 (GOWN DISPOSABLE) ×2 IMPLANT
GOWN STRL SURGICAL XL XLNG (GOWN DISPOSABLE) IMPLANT
KIT PROCEDURE FLUENT (KITS) ×2 IMPLANT
KIT TURNOVER KIT B (KITS) ×2 IMPLANT
PACK VAGINAL MINOR WOMEN LF (CUSTOM PROCEDURE TRAY) ×2 IMPLANT
PAD OB MATERNITY 11 LF (PERSONAL CARE ITEMS) ×2 IMPLANT
SEAL ROD LENS SCOPE MYOSURE (ABLATOR) ×2 IMPLANT
SOL .9 NS 3000ML IRR UROMATIC (IV SOLUTION) IMPLANT
SOL PREP POV-IOD 4OZ 10% (MISCELLANEOUS) IMPLANT
TOWEL GREEN STERILE FF (TOWEL DISPOSABLE) ×4 IMPLANT
UNDERPAD 30X36 HEAVY ABSORB (UNDERPADS AND DIAPERS) ×2 IMPLANT

## 2024-04-09 NOTE — Interval H&P Note (Signed)
 History and Physical Interval Note:  04/09/2024 7:15 AM  [name]  has presented today for surgery, with the diagnosis of menorrhagia.  The various methods of treatment have been discussed with the patient and family. After consideration of risks, benefits and other options for treatment, the patient has consented to  Procedure(s): DILATATION & CURETTAGE-possible/HYSTEROSCOPY (N/A) NOVASURE ABLATION (N/A) MYOSURE RESECTION, possible (N/A) as a surgical intervention.  The patient's history has been reviewed, patient examined, no change in status, stable for surgery.  I have reviewed the patient's chart and labs.  Questions were answered to the patient's satisfaction.     Krystal BIRCH Reice Bienvenue

## 2024-04-09 NOTE — Anesthesia Procedure Notes (Signed)
 Procedure Name: LMA Insertion Date/Time: 04/09/2024 8:05 AM  Performed by: Jolynn Mage, CRNAPre-anesthesia Checklist: Patient identified, Emergency Drugs available, Suction available and Patient being monitored Patient Re-evaluated:Patient Re-evaluated prior to induction Oxygen  Delivery Method: Circle system utilized Preoxygenation: Pre-oxygenation with 100% oxygen  Induction Type: IV induction Ventilation: Mask ventilation without difficulty LMA: LMA flexible inserted LMA Size: 4.0 Number of attempts: 1 Placement Confirmation: positive ETCO2 and breath sounds checked- equal and bilateral Tube secured with: Tape Dental Injury: Teeth and Oropharynx as per pre-operative assessment

## 2024-04-09 NOTE — Transfer of Care (Signed)
 Immediate Anesthesia Transfer of Care Note  Patient: [name]  Procedure(s) Performed: DILATATION & CURETTAGE/ HYSTEROSCOPY NOVASURE ABLATION  Patient Location: PACU  Anesthesia Type:General  Level of Consciousness: awake, alert , and patient cooperative  Airway & Oxygen  Therapy: Patient Spontanous Breathing  Post-op Assessment: Report given to RN, Post -op Vital signs reviewed and stable, and Patient moving all extremities X 4  Post vital signs: Reviewed and stable  Last Vitals:  Vitals Value Taken Time  BP    Temp    Pulse    Resp    SpO2      Last Pain:  Vitals:   04/09/24 9367  TempSrc: Oral  PainSc: 3       Patients Stated Pain Goal: 4 (04/09/24 9367)  Complications: No notable events documented.

## 2024-04-09 NOTE — Op Note (Signed)
 Preoperative diagnosis: Menorrhagia Postoperative diagnosis: Menorrhagia Procedure: Hysteroscopy, D&C, NovaSure endometrial ablation Surgeon: Krystal Deaner M.D. Anesthesia: Gen. With an LMA, deep paracervical block Findings: She had a normal endometrial cavity with fairly abundant endometrial tissue but no unusual lesions. The NovaSure device used to a depth of 5.5 cm, a width of 4 cm and used 121 W for 80 seconds. Fluid deficit to the hysteroscope was 100 cc. Estimated blood loss: 10 Specimens: None Complications: None  Procedure in detail: The patient was taken to the operating room and placed in the dorsosupine position. General anesthesia was induced and she was placed in mobile stirrups. Perineum and vagina were prepped and draped in usual sterile fashion. A Graves speculum was inserted into the vagina and the anterior lip of the cervix was grasped with a single-tooth tenaculum. The paracervical block was then performed with a total of 16 cc 1% lidocaine . Uterus then sounded to 9 cm. Cervix was easily dilated to size 19 dilator. The observer hysteroscope was inserted and good visualization was achieved using lactated Ringer 's. The endometrial cavity was normal with no fibroids or significant polyps. The hysteroscope was removed. Sharp curettage was performed just to remove some of the abundant menstrual endometrium. The hysteroscope was reinserted, good visualization, normal cavity, scope and fluid removed. The cervix was further dilated to a size 23 dilator measuring the cervix a 3.5 cm. The NovaSure device was inserted and deployed properly. The CO2 test passed. Endometrial ablation was performed with the above-mentioned settings without difficulty. The device was then allowed to cool for about 30 seconds and was removed. Hysteroscopy was then performed which revealed good global endometrial ablation and still no lesions. Hysteroscope and fluid were then removed. The single-tooth tenaculum was  removed from the cervix. Bleeding was controlled with pressure. All instruments were then removed from the vagina. The patient tolerated the procedure well and was taken to the recovery in stable condition. Counts were correct, she received no antibiotics, she had PAS hose on throughout the procedure.

## 2024-04-09 NOTE — Discharge Instructions (Addendum)
 Routine instructions for hysteroscopy and endometrial ablation  D & C Home care Instructions:   Personal hygiene:  Used sanitary napkins for vaginal drainage not tampons. Shower or tub bathe the day after your procedure. No douching until bleeding stops. Always wipe from front to back after  Elimination.  Activity: Do not drive or operate any equipment today. The effects of the anesthesia are still present and drowsiness may result. Rest today, not necessarily flat bed rest, just take it easy. You may resume your normal activity in one to 2 days.  Sexual activity: No intercourse for one week or as indicated by your physician  Diet: Eat a light diet as desired this evening. You may resume a regular diet tomorrow.  Return to work: One to 2 days.  General Expectations of your surgery: Vaginal bleeding should be no heavier than a normal period. Spotting may continue up to 10 days. Mild cramps may continue for a couple of days. You may have a regular period in 2-6 weeks.  Unexpected observations call your doctor if these occur: persistent or heavy bleeding. Severe abdominal cramping or pain. Elevation of temperature greater than 100F.  Call for an appointment in one week.    ///////////////////////////////////////////////////////////////////////////////////////////////////////////////////////////////////////////////////////////////////////////////////////////////////////// Post Anesthesia Home Care Instructions  Activity: Get plenty of rest for the remainder of the day. A responsible individual must stay with you for 24 hours following the procedure.  For the next 24 hours, DO NOT: -Drive a car -Advertising copywriter -Drink alcoholic beverages -Take any medication unless instructed by your physician -Make any legal decisions or sign important papers.  Meals: Start with liquid foods such as gelatin or soup. Progress to regular foods as tolerated. Avoid greasy, spicy, heavy foods. If nausea  and/or vomiting occur, drink only clear liquids until the nausea and/or vomiting subsides. Call your physician if vomiting continues.  Special Instructions/Symptoms: Your throat may feel dry or sore from the anesthesia or the breathing tube placed in your throat during surgery. If this causes discomfort, gargle with warm salt water. The discomfort should disappear within 24 hours.  If you had a scopolamine patch placed behind your ear for the management of post- operative nausea and/or vomiting:  1. The medication in the patch is effective for 72 hours, after which it should be removed.  Wrap patch in a tissue and discard in the trash. Wash hands thoroughly with soap and water. 2. You may remove the patch earlier than 72 hours if you experience unpleasant side effects which may include dry mouth, dizziness or visual disturbances. 3. Avoid touching the patch. Wash your hands with soap and water after contact with the patch.

## 2024-04-09 NOTE — Anesthesia Postprocedure Evaluation (Signed)
 Anesthesia Post Note  Patient: [name]  Procedure(s) Performed: DILATATION & CURETTAGE/ HYSTEROSCOPY NOVASURE ABLATION     Patient location during evaluation: PACU Anesthesia Type: General Level of consciousness: awake and alert Pain management: pain level controlled Vital Signs Assessment: post-procedure vital signs reviewed and stable Respiratory status: spontaneous breathing, nonlabored ventilation, respiratory function stable and patient connected to nasal cannula oxygen  Cardiovascular status: blood pressure returned to baseline and stable Postop Assessment: no apparent nausea or vomiting Anesthetic complications: no   No notable events documented.  Last Vitals:  Vitals:   04/09/24 0843 04/09/24 0920  BP: (!) 128/91 118/79  Pulse: 74   Resp: 14   Temp: 36.6 C 36.5 C  SpO2: 100%     Last Pain:  Vitals:   04/09/24 0920  TempSrc:   PainSc: 0-No pain                 Cordella P Sugey Trevathan

## 2024-04-10 ENCOUNTER — Encounter (HOSPITAL_COMMUNITY): Payer: Self-pay | Admitting: Obstetrics and Gynecology

## 2024-04-13 DIAGNOSIS — F3130 Bipolar disorder, current episode depressed, mild or moderate severity, unspecified: Secondary | ICD-10-CM | POA: Diagnosis not present

## 2024-04-13 DIAGNOSIS — E669 Obesity, unspecified: Secondary | ICD-10-CM | POA: Diagnosis not present

## 2024-04-13 DIAGNOSIS — I10 Essential (primary) hypertension: Secondary | ICD-10-CM | POA: Diagnosis not present

## 2024-04-13 DIAGNOSIS — F909 Attention-deficit hyperactivity disorder, unspecified type: Secondary | ICD-10-CM | POA: Diagnosis not present

## 2024-04-13 DIAGNOSIS — R7303 Prediabetes: Secondary | ICD-10-CM | POA: Diagnosis not present

## 2024-04-13 DIAGNOSIS — D500 Iron deficiency anemia secondary to blood loss (chronic): Secondary | ICD-10-CM | POA: Diagnosis not present

## 2024-04-13 DIAGNOSIS — K219 Gastro-esophageal reflux disease without esophagitis: Secondary | ICD-10-CM | POA: Diagnosis not present

## 2024-04-13 DIAGNOSIS — Z9884 Bariatric surgery status: Secondary | ICD-10-CM | POA: Diagnosis not present

## 2024-04-13 DIAGNOSIS — G4733 Obstructive sleep apnea (adult) (pediatric): Secondary | ICD-10-CM | POA: Diagnosis not present

## 2024-04-13 LAB — SURGICAL PATHOLOGY

## 2024-05-04 ENCOUNTER — Telehealth: Payer: Self-pay | Admitting: Cardiology

## 2024-05-04 NOTE — Telephone Encounter (Signed)
 Called patient back about message. Patient complaining of chest pain that was like a sharp pain this morning, but did not last. Patient stated it might have been indigestion, but she also had some SOB and some pitting edema in her hands and ankles. Patient stated she also has been having some lightheadedness after taking all her BP medications in the morning. Encouraged patient to take her BP medications at separate times, to see if this helps. Encouraged patient to reduce her salt intake. Made patient an appointment with the DOD, next available, to get evaluated. Encouraged patient to go to the ED if her chest pain comes back or gets worse with no relief. Will forward to provider for further advisement.

## 2024-05-04 NOTE — Telephone Encounter (Signed)
 Patient c/o Palpitations:  STAT if patient reporting lightheadedness, shortness of breath, or chest pain  How long have you had palpitations/irregular HR/ Afib? Are you having the symptoms now? Not at this exact time, last episode was today around 10:30 AM)  Are you currently experiencing lightheadedness, SOB or CP?  A little chest pain this morning, but not at this time  Do you have a history of afib (atrial fibrillation) or irregular heart rhythm?   Have you checked your BP or HR? (document readings if available): 100/64, 124/84  Are you experiencing any other symptoms? Swelling in her hands and ankles- patient would like to be seen sooner than 06-03-24

## 2024-05-06 ENCOUNTER — Ambulatory Visit: Admitting: Cardiovascular Disease
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
## 2024-05-07 ENCOUNTER — Other Ambulatory Visit: Payer: Self-pay | Admitting: Physician Assistant

## 2024-05-13 ENCOUNTER — Ambulatory Visit

## 2024-05-13 ENCOUNTER — Ambulatory Visit: Attending: Physician Assistant | Admitting: Physician Assistant

## 2024-05-13 ENCOUNTER — Encounter: Payer: Self-pay | Admitting: Physician Assistant

## 2024-05-13 VITALS — BP 120/90 | HR 60 | Ht 64.0 in | Wt 197.6 lb

## 2024-05-13 DIAGNOSIS — R072 Precordial pain: Secondary | ICD-10-CM | POA: Diagnosis not present

## 2024-05-13 DIAGNOSIS — R42 Dizziness and giddiness: Secondary | ICD-10-CM | POA: Diagnosis not present

## 2024-05-13 DIAGNOSIS — I10 Essential (primary) hypertension: Secondary | ICD-10-CM

## 2024-05-13 DIAGNOSIS — I5032 Chronic diastolic (congestive) heart failure: Secondary | ICD-10-CM

## 2024-05-13 DIAGNOSIS — R002 Palpitations: Secondary | ICD-10-CM | POA: Diagnosis not present

## 2024-05-13 MED ORDER — AMLODIPINE BESYLATE 5 MG PO TABS: 5.0000 mg | ORAL_TABLET | Freq: Every day | ORAL | 3 refills | Status: DC

## 2024-05-13 NOTE — Progress Notes (Signed)
 OFFICE NOTE:    Date:  05/13/2024  ID:  [name], DOB Mar 11, 1986, MRN [id number] PCP: Catalina Bare, MD  [facility] Providers Cardiologist:  Powell FORBES Sorrow, MD (Inactive)        (HFpEF) heart failure with preserved ejection fraction TTE 11/25/2020: EF 55-60, no RWMA, normal RVSF, trivial MR Developed volume overload after delivery of second child in 2018 Palpitations Monitor 11/2022: Rare PVCs, PACs, no sustained arrhythmias Preeclampsia with third pregnancy Sleep apnea Hypertension Bipolar disorder GERD Morbid obesity s/p gastric bypass       Discussed the use of AI scribe software for clinical note transcription with the patient, who gave verbal consent to proceed. History of Present Illness [name] is a 38 y.o. female who presents with headaches, chest pain, and lightheadedness. She is a prior patient of Dr. Sorrow.  Last seen in clinic by Rollo Louder, PA-C for/2024.  She has been experiencing headaches, chest pain, and lightheadedness for the past two weeks, which began after starting Abilify. The lightheadedness occurs upon standing and sometimes after taking her medications, which she takes all at once, accompanied by tingling in her lips. Her chest pain is intermittent and is associated with changes in position. She experiences no shortness of breath, even when lying flat, and no episodes of syncope, although she sometimes feels like she might faint when standing up too quickly. She has a history of anemia, for which she receives iron infusions. She recently underwent an endometrial ablation on July 3rd due to heavy menstrual cycles, contributing to her low blood count. She is scheduled for a follow-up CBC tomorrow at the cancer center. She experiences daily palpitations.  She has been wearing compression socks, which she feels may help with the lightheadedness. She reports occasional alcohol use and denies smoking or drug use.     ROS-See HPI    Studies Reviewed:  EKG Interpretation Date/Time:  Wednesday May 13 2024 15:18:08 EDT Ventricular Rate:  60 PR Interval:  162 QRS Duration:  84 QT Interval:  400 QTC Calculation: 400 R Axis:   42  Text Interpretation: Normal sinus rhythm Normal ECG Confirmed by Lelon Hamilton [phone]) on 05/13/2024 4:00:13 PM    HYPERTENSION CONTROL Vitals:   05/13/24 1508 05/13/24 1625  BP: (!) 130/90 (!) 120/90    The patient's blood pressure is elevated above target today.  In order to address the patient's elevated BP: Blood pressure will be monitored at home to determine if medication changes need to be made.         Physical Exam:  VS:  BP (!) 120/90   Pulse 60   Ht 5' 4 (1.626 m)   Wt 197 lb 9.6 oz (89.6 kg)   SpO2 98%   BMI 33.92 kg/m        Wt Readings from Last 3 Encounters:  05/13/24 197 lb 9.6 oz (89.6 kg)  04/09/24 200 lb (90.7 kg)  01/29/23 237 lb 3.2 oz (107.6 kg)    Constitutional:      Appearance: Healthy appearance. Not in distress.  Neck:     Vascular: JVD normal.  Pulmonary:     Breath sounds: Normal breath sounds. No wheezing. No rales.  Cardiovascular:     Normal rate. Regular rhythm.     Murmurs: There is no murmur.  Edema:    Peripheral edema absent.  Abdominal:     Palpations: Abdomen is soft.       Assessment and Plan:  Assessment & Plan Dizziness Palpitations Precordial chest pain She notes symptoms of headache, palpitations, dizziness as well as occasional chest discomfort.  Her chest discomfort sounds musculoskeletal and is noncardiac.  She does have a history of anemia related to menorrhagia.  She recently underwent endometrial ablation to help slow down her cycles.  Her most recent hemoglobin in early July was 10.8.  She has a follow-up CBC pending tomorrow.  She has received iron infusions in the past.  I suspect her symptoms may be related to being off of her amlodipine  for blood pressure in combination with possible  worsening anemia and side effects to Abilify.  I have asked her to follow-up with her psychiatrist to see if the dose can be adjusted.  I did check her blood pressure sitting and standing today.  Her systolic pressure dropped 15 points.  I have asked her to continue to push fluids, wear compression hose.  I have also asked her to continue taking furosemide  just as needed.  She does note palpitations.  I will go ahead and obtain an echocardiogram to structural heart disease and a follow-up monitor to rule out significant arrhythmia as a potential cause for symptoms. -Arrange echocardiogram -Arrange 2-week cardiac monitor -BMET today -Follow-up with psychiatry as noted -Follow-up with hematology as planned -Follow-up here 6 months (I will establish her with one of the new cardiologists at that time) Chronic heart failure with preserved ejection fraction (HCC) EF 55-60 by echocardiogram 2022.  She developed volume overload after the delivery of her second child in 2018.  She also has a history of preeclampsia.  As noted, given her symptoms, I will obtain a follow-up echocardiogram for structural heart disease.  Continue furosemide  20 mg as needed for lower extremity edema. Essential hypertension Blood pressure uncontrolled.  She ran out of amlodipine  recently.   - Restart amlodipine  5 mg daily.   - Continue carvedilol  12.5 mg twice daily, lisinopril  30 mg daily.   - Obtain BMET today.         Dispo:  Return in about 6 months (around 11/13/2024) for Routine Follow Up, w/ Glendia Ferrier, PA-C.  Signed, Glendia Ferrier, PA-C

## 2024-05-13 NOTE — Assessment & Plan Note (Signed)
 Blood pressure uncontrolled.  She ran out of amlodipine  recently.   - Restart amlodipine  5 mg daily.   - Continue carvedilol  12.5 mg twice daily, lisinopril  30 mg daily.   - Obtain BMET today.

## 2024-05-13 NOTE — Patient Instructions (Signed)
 Medication Instructions:  Your physician recommends that you continue on your current medications as directed. Please refer to the Current Medication list given to you today.  *If you need a refill on your cardiac medications before your next appointment, please call your pharmacy*  Lab Work: TODAY:  BMET  If you have labs (blood work) drawn today and your tests are completely normal, you will receive your results only by: MyChart Message (if you have MyChart) OR A paper copy in the mail If you have any lab test that is abnormal or we need to change your treatment, we will call you to review the results.  Testing/Procedures: Your physician has requested that you have an echocardiogram. Echocardiography is a painless test that uses sound waves to create images of your heart. It provides your doctor with information about the size and shape of your heart and how well your heart's chambers and valves are working. This procedure takes approximately one hour. There are no restrictions for this procedure. Please do NOT wear cologne, perfume, aftershave, or lotions (deodorant is allowed). Please arrive 15 minutes prior to your appointment time.  Please note: We ask at that you not bring children with you during ultrasound (echo/ vascular) testing. Due to room size and safety concerns, children are not allowed in the ultrasound rooms during exams. Our front office staff cannot provide observation of children in our lobby area while testing is being conducted. An adult accompanying a patient to their appointment will only be allowed in the ultrasound room at the discretion of the ultrasound technician under special circumstances. We apologize for any inconvenience.   ZIO XT- Long Term Monitor Instructions  Your physician has requested you wear a ZIO patch monitor for 14 days.  This is a single patch monitor. [employer] supplies one patch monitor per enrollment. Additional stickers are not available.  Please do not apply patch if you will be having a Nuclear Stress Test,  Echocardiogram, Cardiac CT, MRI, or Chest Xray during the period you would be wearing the  monitor. The patch cannot be worn during these tests. You cannot remove and re-apply the  ZIO XT patch monitor.  Your ZIO patch monitor will be mailed 3 day USPS to your address on file. It may take 3-5 days  to receive your monitor after you have been enrolled.  Once you have received your monitor, please review the enclosed instructions. Your monitor  has already been registered assigning a specific monitor serial # to you.  Billing and Patient Assistance Program Information  We have supplied [employer] with any of your insurance information on file for billing purposes. [employer] offers a sliding scale Patient Assistance Program for patients that do not have  insurance, or whose insurance does not completely cover the cost of the ZIO monitor.  You must apply for the Patient Assistance Program to qualify for this discounted rate.  To apply, please call [employer] at [phone], select option 4, select option 2, ask to apply for  Patient Assistance Program. Meredeth will ask your household income, and how many people  are in your household. They will quote your out-of-pocket cost based on that information.  [employer] will also be able to set up a 9-month, interest-free payment plan if needed.  Applying the monitor   Shave hair from upper left chest.  Hold abrader disc by orange tab. Rub abrader in 40 strokes over the upper left chest as  indicated in your monitor instructions.  Clean area with 4 enclosed  alcohol pads. Let dry.  Apply patch as indicated in monitor instructions. Patch will be placed under collarbone on left  side of chest with arrow pointing upward.  Rub patch adhesive wings for 2 minutes. Remove white label marked 1. Remove the white  label marked 2. Rub patch adhesive wings for 2 additional minutes.  While  looking in a mirror, press and release button in center of patch. A small green light will  flash 3-4 times. This will be your only indicator that the monitor has been turned on.  Do not shower for the first 24 hours. You may shower after the first 24 hours.  Press the button if you feel a symptom. You will hear a small click. Record Date, Time and  Symptom in the Patient Logbook.  When you are ready to remove the patch, follow instructions on the last 2 pages of Patient  Logbook. Stick patch monitor onto the last page of Patient Logbook.  Place Patient Logbook in the blue and white box. Use locking tab on box and tape box closed  securely. The blue and white box has prepaid postage on it. Please place it in the mailbox as  soon as possible. Your physician should have your test results approximately 7 days after the  monitor has been mailed back to [facility].  Call [facility] Customer Care at [phone] if you have questions regarding  your ZIO XT patch monitor. Call them immediately if you see an orange light blinking on your  monitor.  If your monitor falls off in less than 4 days, contact our Monitor department at [phone].  If your monitor becomes loose or falls off after 4 days call [employer] at [phone] for  suggestions on securing your monitor   Follow-Up: At [facility], you and your health needs are our priority.  As part of our continuing mission to provide you with exceptional heart care, our providers are all part of one team.  This team includes your primary Cardiologist (physician) and Advanced Practice Providers or APPs (Physician Assistants and Nurse Practitioners) who all work together to provide you with the care you need, when you need it.  Your next appointment:   6 month(s)  Provider:   Glendia Ferrier, PA-C          We recommend signing up for the patient portal called MyChart.  Sign up information is provided on this After Visit Summary.   MyChart is used to connect with patients for Virtual Visits (Telemedicine).  Patients are able to view lab/test results, encounter notes, upcoming appointments, etc.  Non-urgent messages can be sent to your provider as well.   To learn more about what you can do with MyChart, go to [email or web address].   Other Instructions Talk with your dr about the Abilify

## 2024-05-13 NOTE — Progress Notes (Unsigned)
 Enrolled for [employer] to mail a ZIO XT long term holter monitor to the patients address on file.   DOD to read.

## 2024-05-14 ENCOUNTER — Ambulatory Visit: Payer: Self-pay | Admitting: Physician Assistant

## 2024-05-14 DIAGNOSIS — N920 Excessive and frequent menstruation with regular cycle: Secondary | ICD-10-CM | POA: Diagnosis not present

## 2024-05-14 DIAGNOSIS — I5032 Chronic diastolic (congestive) heart failure: Secondary | ICD-10-CM

## 2024-05-14 DIAGNOSIS — K912 Postsurgical malabsorption, not elsewhere classified: Secondary | ICD-10-CM | POA: Diagnosis not present

## 2024-05-14 DIAGNOSIS — Z9884 Bariatric surgery status: Secondary | ICD-10-CM | POA: Diagnosis not present

## 2024-05-14 DIAGNOSIS — D500 Iron deficiency anemia secondary to blood loss (chronic): Secondary | ICD-10-CM | POA: Diagnosis not present

## 2024-05-14 DIAGNOSIS — Z903 Acquired absence of stomach [part of]: Secondary | ICD-10-CM | POA: Diagnosis not present

## 2024-05-14 LAB — BASIC METABOLIC PANEL WITH GFR
BUN/Creatinine Ratio: 8 — ABNORMAL LOW (ref 9–23)
BUN: 6 mg/dL (ref 6–20)
CO2: 23 mmol/L (ref 20–29)
Calcium: 9.4 mg/dL (ref 8.7–10.2)
Chloride: 101 mmol/L (ref 96–106)
Creatinine, Ser: 0.74 mg/dL (ref 0.57–1.00)
Glucose: 86 mg/dL (ref 70–99)
Potassium: 4 mmol/L (ref 3.5–5.2)
Sodium: 138 mmol/L (ref 134–144)
eGFR: 107 mL/min/1.73 (ref >59)

## 2024-05-19 DIAGNOSIS — Z9884 Bariatric surgery status: Secondary | ICD-10-CM | POA: Diagnosis not present

## 2024-05-19 DIAGNOSIS — D500 Iron deficiency anemia secondary to blood loss (chronic): Secondary | ICD-10-CM | POA: Diagnosis not present

## 2024-05-19 DIAGNOSIS — K912 Postsurgical malabsorption, not elsewhere classified: Secondary | ICD-10-CM | POA: Diagnosis not present

## 2024-05-19 DIAGNOSIS — Z903 Acquired absence of stomach [part of]: Secondary | ICD-10-CM | POA: Diagnosis not present

## 2024-06-02 NOTE — Progress Notes (Deleted)
{  This patient may be at risk for Amyloid. She has one or more dx on the prob list or PMH from the following list -  Abnormal EKG, HFpEF/Diastolic CHF, Aortic Stenosis, LVH, Bilateral Carpal Tunnel Syndrome, Biceps Tendon Rupture, Spinal Stenosis, Pericardial Effusion, Left Atrial Enlargement, Conduction System Disorder. See list below or review PMH.    Click HERE to open Cardiac Amyloid Screening SmartSet to order screening OR Click HERE to defer testing for 1 year or permanently :1}     OFFICE NOTE:    Date:  06/02/2024  ID:  Ethel LITTIE Moats, DOB 12/04/1985, MRN [id number] PCP: Catalina Bare, MD  [facility] Providers Cardiologist:  Powell FORBES Sorrow, MD (Inactive) { Click to update primary MD,subspecialty MD or APP then REFRESH:1}      *** (HFpEF) heart failure with preserved ejection fraction TTE 11/25/2020: EF 55-60, no RWMA, normal RVSF, trivial MR Developed volume overload after delivery of second child in 2018 Palpitations Monitor 11/2022: Rare PVCs, PACs, no sustained arrhythmias Preeclampsia with third pregnancy Sleep apnea Hypertension Bipolar disorder GERD Morbid obesity s/p gastric bypass       Discussed the use of AI scribe software for clinical note transcription with the patient, who gave verbal consent to proceed. History of Present Illness [name] is a 38 y.o. female who returns for ***. She was last seen 05/13/24 with chest pain and dizziness. He chest pain seemed to be MSK and noncardiac. I felt her dizziness and palpitations were related to anemia, coming off her BP med and side effects to Abilify. I asked her to follow up with her psychiatrist and push fluids. Echocardiogram and monitor were ordered.     ROS-See HPI***    Studies Reviewed:      *** Results  Risk Assessment/Calculations: {Does this patient have ATRIAL FIBRILLATION?[phone]} No BP recorded.  {Refresh Note OR Click here to enter BP  :1}***      Physical Exam:   VS:  There were no vitals taken for this visit.       Wt Readings from Last 3 Encounters:  05/13/24 197 lb 9.6 oz (89.6 kg)  04/09/24 200 lb (90.7 kg)  01/29/23 237 lb 3.2 oz (107.6 kg)    Physical Exam***     Assessment and Plan:    Assessment & Plan  Assessment and Plan Assessment & Plan    {      :1}    {Are you ordering a CV Procedure (e.g. stress test, cath, DCCV, TEE, etc)?   Press F2        :[id number]}  Dispo:  No follow-ups on file.  Signed, Glendia Ferrier, PA-C

## 2024-06-03 ENCOUNTER — Ambulatory Visit: Admitting: Physician Assistant

## 2024-06-03 ENCOUNTER — Other Ambulatory Visit: Payer: Self-pay | Admitting: Medical Genetics

## 2024-06-06 DIAGNOSIS — R42 Dizziness and giddiness: Secondary | ICD-10-CM | POA: Diagnosis not present

## 2024-06-06 DIAGNOSIS — R002 Palpitations: Secondary | ICD-10-CM | POA: Diagnosis not present

## 2024-06-09 DIAGNOSIS — R42 Dizziness and giddiness: Secondary | ICD-10-CM | POA: Diagnosis not present

## 2024-06-09 DIAGNOSIS — R002 Palpitations: Secondary | ICD-10-CM

## 2024-06-09 DIAGNOSIS — H60501 Unspecified acute noninfective otitis externa, right ear: Secondary | ICD-10-CM | POA: Diagnosis not present

## 2024-06-09 DIAGNOSIS — H66001 Acute suppurative otitis media without spontaneous rupture of ear drum, right ear: Secondary | ICD-10-CM | POA: Diagnosis not present

## 2024-06-22 ENCOUNTER — Ambulatory Visit (HOSPITAL_COMMUNITY)
Admission: RE | Admit: 2024-06-22 | Discharge: 2024-06-22 | Disposition: A | Discharge status: Home / Self Care | Source: Ambulatory Visit | Attending: Physician Assistant | Admitting: Physician Assistant

## 2024-06-22 DIAGNOSIS — I5032 Chronic diastolic (congestive) heart failure: Secondary | ICD-10-CM | POA: Insufficient documentation

## 2024-06-22 DIAGNOSIS — R42 Dizziness and giddiness: Secondary | ICD-10-CM | POA: Insufficient documentation

## 2024-06-22 DIAGNOSIS — R072 Precordial pain: Secondary | ICD-10-CM | POA: Diagnosis not present

## 2024-06-22 DIAGNOSIS — I10 Essential (primary) hypertension: Secondary | ICD-10-CM | POA: Diagnosis not present

## 2024-06-22 LAB — ECHOCARDIOGRAM COMPLETE
AR max vel: 1.57 cm2
AV Area VTI: 1.56 cm2
AV Area mean vel: 1.5 cm2
AV Mean grad: 6 mmHg
AV Peak grad: 10.5 mmHg
Ao pk vel: 1.62 m/s
Area-P 1/2: 3.31 cm2
Calc EF: 61.4 %
S' Lateral: 3.8 cm
Single Plane A2C EF: 63.2 %
Single Plane A4C EF: 55.4 %

## 2024-07-17 ENCOUNTER — Telehealth: Payer: Self-pay | Admitting: Physician Assistant

## 2024-07-17 MED ORDER — CARVEDILOL 12.5 MG PO TABS: 12.5000 mg | ORAL_TABLET | Freq: Two times a day (BID) | ORAL | 3 refills | Status: AC

## 2024-07-17 NOTE — Telephone Encounter (Signed)
 RX sent in

## 2024-07-17 NOTE — Telephone Encounter (Signed)
*  STAT* If patient is at the pharmacy, call can be transferred to refill team.   1. Which medications need to be refilled? (please list name of each medication and dose if known)   carvedilol  (COREG ) 12.5 MG tablet    2. Which pharmacy/location (including street and city if local pharmacy) is medication to be sent to [id number] - [place], [place] - [address] AT [facility] [address] Phone: [phone]  Fax: [phone]     3. Do they need a 30 day or 90 day supply? 90 Pt is out of medication

## 2024-07-18 ENCOUNTER — Other Ambulatory Visit: Payer: Self-pay | Admitting: Medical Genetics

## 2024-07-18 DIAGNOSIS — Z006 Encounter for examination for normal comparison and control in clinical research program: Secondary | ICD-10-CM

## 2024-07-22 DIAGNOSIS — G4733 Obstructive sleep apnea (adult) (pediatric): Secondary | ICD-10-CM | POA: Diagnosis not present

## 2024-07-22 DIAGNOSIS — I5032 Chronic diastolic (congestive) heart failure: Secondary | ICD-10-CM | POA: Diagnosis not present

## 2024-07-22 DIAGNOSIS — E119 Type 2 diabetes mellitus without complications: Secondary | ICD-10-CM | POA: Diagnosis not present

## 2024-07-22 DIAGNOSIS — J4530 Mild persistent asthma, uncomplicated: Secondary | ICD-10-CM | POA: Diagnosis not present

## 2024-07-22 DIAGNOSIS — I110 Hypertensive heart disease with heart failure: Secondary | ICD-10-CM | POA: Diagnosis not present

## 2024-07-22 DIAGNOSIS — E782 Mixed hyperlipidemia: Secondary | ICD-10-CM | POA: Diagnosis not present

## 2024-08-08 ENCOUNTER — Other Ambulatory Visit: Payer: Self-pay | Admitting: Physician Assistant

## 2024-08-12 ENCOUNTER — Ambulatory Visit (INDEPENDENT_AMBULATORY_CARE_PROVIDER_SITE_OTHER): Admitting: Podiatry

## 2024-08-12 ENCOUNTER — Encounter: Payer: Self-pay | Admitting: Podiatry

## 2024-08-12 DIAGNOSIS — Z9884 Bariatric surgery status: Secondary | ICD-10-CM | POA: Diagnosis not present

## 2024-08-12 DIAGNOSIS — N920 Excessive and frequent menstruation with regular cycle: Secondary | ICD-10-CM | POA: Diagnosis not present

## 2024-08-12 DIAGNOSIS — M79672 Pain in left foot: Secondary | ICD-10-CM

## 2024-08-12 DIAGNOSIS — M2041 Other hammer toe(s) (acquired), right foot: Secondary | ICD-10-CM

## 2024-08-12 DIAGNOSIS — M25571 Pain in right ankle and joints of right foot: Secondary | ICD-10-CM

## 2024-08-12 DIAGNOSIS — Z903 Acquired absence of stomach [part of]: Secondary | ICD-10-CM | POA: Diagnosis not present

## 2024-08-12 DIAGNOSIS — D500 Iron deficiency anemia secondary to blood loss (chronic): Secondary | ICD-10-CM | POA: Diagnosis not present

## 2024-08-12 DIAGNOSIS — B351 Tinea unguium: Secondary | ICD-10-CM

## 2024-08-12 DIAGNOSIS — M79671 Pain in right foot: Secondary | ICD-10-CM | POA: Diagnosis not present

## 2024-08-12 DIAGNOSIS — K912 Postsurgical malabsorption, not elsewhere classified: Secondary | ICD-10-CM | POA: Diagnosis not present

## 2024-08-12 MED ORDER — TERBINAFINE HCL 250 MG PO TABS: 250.0000 mg | ORAL_TABLET | Freq: Every day | ORAL | 1 refills | Status: AC

## 2024-08-12 NOTE — Progress Notes (Signed)
   Subjective:    HPI Patient presents with a complaint of thick painful nails 1 through 5 bilaterally especially on the hallux.  They been bothering her for years and seem to be getting worse.  Currently has some pain along the lateral ankle on the right.  Says she twisted the foot a little bit.  Has not noticed any redness or ecchymosis.  Does not complain of any numbness or burning in the feet.  Objective:  Physical Exam   General: AAO x3, NAD  Vascular: DP and PT pulses palpable bilaterally.  Immedate capillary fill time digits.  Mild to moderate lower extremity edema bilaterally.  Dermatological: Onychomycotic mycotic changes nails 1 through 5 with discoloration nail and subungual debris and thickening of the nail.  Incurvated nail borders 1 through 5 bilaterally.  Neruologic: Grossly intact B/L.  Normal left vibratory sensation in feet bilaterally.  Monofilament sensation intact bilaterally.  Normal Achilles tendon reflex bilaterally.  Musculoskeletal: Tenderness at sinus tarsi and with range of motion subtalar joint right.  No tenderness along calcaneus or posterior heel.  Normal muscle strength lower extremity bilateral  Assessment:  Painful onychomycotic nails 1 through 5 bilaterally. Pain feet b/l Arthralgia subtalar joint right     Plan:  - New office visit level 3 for evaluation and management. - Discussed the pain in the subtalar joint.  Recommend she take some OTC NSAIDs.  She can ice it also use topical Voltaren.  If it does continue to be a problem we can do an injection in the subtalar joint right. - Discussed with her the onychomycosis and discomfort she is having.  I discussed treatment options including topical versus oral discussed risks and benefits of both.  She would like to try the Lamisil.  No history of any other liver or kidney disease.  Discussed that we will do LFTs at 6-week intervals to monitor during treatment -Rx: Lamisil 250 mg p.o. daily - Labs  ordered today for liver function tests to monitor for any hepatic side effects from the Lamisil.  - Return in 6 weeks for  Lamisil 3

## 2024-08-19 ENCOUNTER — Ambulatory Visit (INDEPENDENT_AMBULATORY_CARE_PROVIDER_SITE_OTHER): Admitting: Physician Assistant

## 2024-08-19 ENCOUNTER — Encounter (INDEPENDENT_AMBULATORY_CARE_PROVIDER_SITE_OTHER): Payer: Self-pay | Admitting: Physician Assistant

## 2024-08-19 VITALS — BP 136/84 | HR 78 | Temp 98.7°F | Ht 64.5 in | Wt 187.0 lb

## 2024-08-19 DIAGNOSIS — D509 Iron deficiency anemia, unspecified: Secondary | ICD-10-CM | POA: Diagnosis not present

## 2024-08-19 DIAGNOSIS — Z6831 Body mass index (BMI) 31.0-31.9, adult: Secondary | ICD-10-CM

## 2024-08-19 DIAGNOSIS — E669 Obesity, unspecified: Secondary | ICD-10-CM

## 2024-08-19 DIAGNOSIS — Z9884 Bariatric surgery status: Secondary | ICD-10-CM

## 2024-08-19 DIAGNOSIS — G4733 Obstructive sleep apnea (adult) (pediatric): Secondary | ICD-10-CM | POA: Diagnosis not present

## 2024-08-19 DIAGNOSIS — Z8759 Personal history of other complications of pregnancy, childbirth and the puerperium: Secondary | ICD-10-CM

## 2024-08-19 DIAGNOSIS — R7303 Prediabetes: Secondary | ICD-10-CM

## 2024-08-19 DIAGNOSIS — I5032 Chronic diastolic (congestive) heart failure: Secondary | ICD-10-CM

## 2024-08-19 DIAGNOSIS — I10 Essential (primary) hypertension: Secondary | ICD-10-CM

## 2024-08-19 DIAGNOSIS — E6609 Other obesity due to excess calories: Secondary | ICD-10-CM

## 2024-08-19 DIAGNOSIS — I110 Hypertensive heart disease with heart failure: Secondary | ICD-10-CM

## 2024-08-19 DIAGNOSIS — D508 Other iron deficiency anemias: Secondary | ICD-10-CM
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
## 2024-08-26 ENCOUNTER — Ambulatory Visit

## 2024-09-07 DIAGNOSIS — Z9884 Bariatric surgery status: Secondary | ICD-10-CM | POA: Diagnosis not present

## 2024-09-07 DIAGNOSIS — I10 Essential (primary) hypertension: Secondary | ICD-10-CM | POA: Diagnosis not present

## 2024-09-07 DIAGNOSIS — R110 Nausea: Secondary | ICD-10-CM | POA: Diagnosis not present

## 2024-09-07 DIAGNOSIS — D500 Iron deficiency anemia secondary to blood loss (chronic): Secondary | ICD-10-CM | POA: Diagnosis not present

## 2024-09-07 DIAGNOSIS — G4733 Obstructive sleep apnea (adult) (pediatric): Secondary | ICD-10-CM | POA: Diagnosis not present

## 2024-09-07 DIAGNOSIS — I509 Heart failure, unspecified: Secondary | ICD-10-CM | POA: Diagnosis not present

## 2024-09-07 DIAGNOSIS — Z9189 Other specified personal risk factors, not elsewhere classified: Secondary | ICD-10-CM | POA: Diagnosis not present

## 2024-09-07 DIAGNOSIS — K909 Intestinal malabsorption, unspecified: Secondary | ICD-10-CM | POA: Diagnosis not present

## 2024-09-07 DIAGNOSIS — Z8639 Personal history of other endocrine, nutritional and metabolic disease: Secondary | ICD-10-CM | POA: Diagnosis not present

## 2024-09-23 ENCOUNTER — Ambulatory Visit: Admitting: Podiatry

## 2024-09-28 ENCOUNTER — Other Ambulatory Visit: Payer: Self-pay

## 2024-09-28 ENCOUNTER — Emergency Department (HOSPITAL_COMMUNITY)

## 2024-09-28 ENCOUNTER — Emergency Department (HOSPITAL_COMMUNITY)
Admission: EM | Admit: 2024-09-28 | Discharge: 2024-09-29 | Disposition: A | Discharge status: Home / Self Care | Attending: Emergency Medicine | Admitting: Emergency Medicine

## 2024-09-28 ENCOUNTER — Encounter (HOSPITAL_COMMUNITY): Payer: Self-pay

## 2024-09-28 DIAGNOSIS — S6992XA Unspecified injury of left wrist, hand and finger(s), initial encounter: Secondary | ICD-10-CM | POA: Diagnosis present

## 2024-09-28 DIAGNOSIS — X58XXXA Exposure to other specified factors, initial encounter: Secondary | ICD-10-CM | POA: Diagnosis not present

## 2024-09-28 DIAGNOSIS — Z79899 Other long term (current) drug therapy: Secondary | ICD-10-CM | POA: Diagnosis not present

## 2024-09-28 DIAGNOSIS — Z7951 Long term (current) use of inhaled steroids: Secondary | ICD-10-CM | POA: Diagnosis not present

## 2024-09-28 DIAGNOSIS — Y990 Civilian activity done for income or pay: Secondary | ICD-10-CM | POA: Insufficient documentation

## 2024-09-28 DIAGNOSIS — S63502A Unspecified sprain of left wrist, initial encounter: Secondary | ICD-10-CM | POA: Diagnosis not present

## 2024-09-28 DIAGNOSIS — Z7982 Long term (current) use of aspirin: Secondary | ICD-10-CM | POA: Insufficient documentation

## 2024-09-28 DIAGNOSIS — J4520 Mild intermittent asthma, uncomplicated: Secondary | ICD-10-CM | POA: Diagnosis not present

## 2024-09-28 DIAGNOSIS — I10 Essential (primary) hypertension: Secondary | ICD-10-CM | POA: Diagnosis not present

## 2024-09-28 NOTE — ED Triage Notes (Signed)
 Pt. Arrives for left hand and arm pain. 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 Pain started about 5 hours ago.

## 2024-09-29 DIAGNOSIS — S63502A Unspecified sprain of left wrist, initial encounter: Secondary | ICD-10-CM | POA: Diagnosis not present

## 2024-09-29 MED ORDER — KETOROLAC TROMETHAMINE 60 MG/2ML IM SOLN
30.0000 mg | Freq: Once | INTRAMUSCULAR | Status: AC
  Administered 2024-09-29: 30 mg via INTRAMUSCULAR
  Filled 2024-09-29: qty 2
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
## 2024-09-29 NOTE — Discharge Instructions (Addendum)
 For pain control you may take 1000 mg of acetaminophen (Tylenol) every 8 hours and/or 600 mg of Ibuprofen (Motrin, Advil, etc.) every 6-8 hours as needed.  Please limit acetaminophen (Tylenol) to 4000 mg and Ibuprofen (Motrin, Advil, etc.) to 2400 mg for a 24hr period. Please note that other over-the-counter medicine may contain acetaminophen or ibuprofen as a component of their ingredients.

## 2024-10-09 ENCOUNTER — Ambulatory Visit: Admitting: Podiatry

## 2024-10-27 ENCOUNTER — Ambulatory Visit (HOSPITAL_COMMUNITY)
Admission: EM | Admit: 2024-10-27 | Discharge: 2024-10-27 | Disposition: A | Discharge status: Home / Self Care | Attending: Emergency Medicine | Admitting: Emergency Medicine

## 2024-10-27 ENCOUNTER — Encounter (HOSPITAL_COMMUNITY): Payer: Self-pay | Admitting: Emergency Medicine

## 2024-10-27 DIAGNOSIS — Z20828 Contact with and (suspected) exposure to other viral communicable diseases: Secondary | ICD-10-CM

## 2024-10-27 LAB — POCT INFLUENZA A/B
Influenza A, POC: NEGATIVE
Influenza B, POC: NEGATIVE

## 2024-10-27 MED ORDER — OSELTAMIVIR PHOSPHATE 75 MG PO CAPS: 75.0000 mg | ORAL_CAPSULE | Freq: Two times a day (BID) | ORAL | 0 refills | Status: AC

## 2024-10-27 NOTE — Discharge Instructions (Addendum)
 The tamiflu  is the antiviral for flu. It can shorten the duration of your symptoms by one day. Take twice daily for 5 days. If you develop side effects (nausea/vomiting/diarrhea) you can stop taking it.  Continue other supportive care -- I recommend alternating Tylenol  and ibuprofen  for fever/aches.  Make sure you are drinking lots of fluids to stay hydrated

## 2024-10-27 NOTE — ED Triage Notes (Signed)
 Pt present with a cough, bodyaches, fever, chills and headache since yesterday. Pt has taken aspirin and Mucinex  for her symptoms.
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
